# Patient Record
Sex: Male | Born: 1935 | Race: Black or African American | Hispanic: No | Marital: Married | Smoking: Former smoker
Health system: Southern US, Community
[De-identification: ages and names within clinical notes are randomized; demographics above are authoritative.]

## PROBLEM LIST (undated history)

## (undated) DIAGNOSIS — G990 Autonomic neuropathy in diseases classified elsewhere: Secondary | ICD-10-CM

## (undated) DIAGNOSIS — K7030 Alcoholic cirrhosis of liver without ascites: Secondary | ICD-10-CM

## (undated) DIAGNOSIS — Z91199 Patient's noncompliance with other medical treatment and regimen due to unspecified reason: Secondary | ICD-10-CM

## (undated) DIAGNOSIS — Z978 Presence of other specified devices: Secondary | ICD-10-CM

## (undated) DIAGNOSIS — Z8709 Personal history of other diseases of the respiratory system: Secondary | ICD-10-CM

## (undated) DIAGNOSIS — R600 Localized edema: Secondary | ICD-10-CM

## (undated) DIAGNOSIS — Z960 Presence of urogenital implants: Secondary | ICD-10-CM

## (undated) DIAGNOSIS — F1010 Alcohol abuse, uncomplicated: Secondary | ICD-10-CM

## (undated) DIAGNOSIS — K769 Liver disease, unspecified: Secondary | ICD-10-CM

## (undated) DIAGNOSIS — D649 Anemia, unspecified: Secondary | ICD-10-CM

## (undated) DIAGNOSIS — N312 Flaccid neuropathic bladder, not elsewhere classified: Secondary | ICD-10-CM

## (undated) DIAGNOSIS — F1020 Alcohol dependence, uncomplicated: Principal | ICD-10-CM

## (undated) DIAGNOSIS — I10 Essential (primary) hypertension: Secondary | ICD-10-CM

## (undated) DIAGNOSIS — Z9119 Patient's noncompliance with other medical treatment and regimen: Secondary | ICD-10-CM

## (undated) DIAGNOSIS — R972 Elevated prostate specific antigen [PSA]: Secondary | ICD-10-CM

## (undated) DIAGNOSIS — I499 Cardiac arrhythmia, unspecified: Secondary | ICD-10-CM

## (undated) DIAGNOSIS — I480 Paroxysmal atrial fibrillation: Secondary | ICD-10-CM

## (undated) DIAGNOSIS — R296 Repeated falls: Secondary | ICD-10-CM

## (undated) DIAGNOSIS — K219 Gastro-esophageal reflux disease without esophagitis: Secondary | ICD-10-CM

## (undated) DIAGNOSIS — E785 Hyperlipidemia, unspecified: Secondary | ICD-10-CM

## (undated) DIAGNOSIS — M1990 Unspecified osteoarthritis, unspecified site: Secondary | ICD-10-CM

## (undated) HISTORY — DX: Paroxysmal atrial fibrillation: I48.0

## (undated) HISTORY — DX: Elevated prostate specific antigen (PSA): R97.20

## (undated) HISTORY — DX: Gastro-esophageal reflux disease without esophagitis: K21.9

## (undated) HISTORY — DX: Autonomic neuropathy in diseases classified elsewhere: G99.0

## (undated) HISTORY — DX: Flaccid neuropathic bladder, not elsewhere classified: N31.2

## (undated) HISTORY — DX: Anemia, unspecified: D64.9

## (undated) HISTORY — DX: Presence of urogenital implants: Z96.0

## (undated) HISTORY — DX: Patient's noncompliance with other medical treatment and regimen due to unspecified reason: Z91.199

## (undated) HISTORY — DX: Alcohol abuse, uncomplicated: F10.10

## (undated) HISTORY — DX: Hyperlipidemia, unspecified: E78.5

## (undated) HISTORY — DX: Essential (primary) hypertension: I10

## (undated) HISTORY — DX: Personal history of other diseases of the respiratory system: Z87.09

## (undated) HISTORY — DX: Alcoholic cirrhosis of liver without ascites: K70.30

## (undated) HISTORY — DX: Unspecified osteoarthritis, unspecified site: M19.90

## (undated) HISTORY — DX: Patient's noncompliance with other medical treatment and regimen: Z91.19

## (undated) HISTORY — DX: Localized edema: R60.0

## (undated) HISTORY — DX: Liver disease, unspecified: K76.9

## (undated) HISTORY — DX: Alcohol dependence, uncomplicated: F10.20

## (undated) HISTORY — PX: LIPOMA EXCISION: SHX5283

## (undated) SURGERY — DRAINAGE, EMPYEMA
Anesthesia: General | Laterality: Right

---

## 1999-09-24 ENCOUNTER — Ambulatory Visit (HOSPITAL_BASED_OUTPATIENT_CLINIC_OR_DEPARTMENT_OTHER): Admission: RE | Admit: 1999-09-24 | Discharge: 1999-09-24 | Payer: Self-pay | Admitting: *Deleted

## 1999-09-24 ENCOUNTER — Encounter (INDEPENDENT_AMBULATORY_CARE_PROVIDER_SITE_OTHER): Payer: Self-pay | Admitting: *Deleted

## 2003-09-14 ENCOUNTER — Encounter (INDEPENDENT_AMBULATORY_CARE_PROVIDER_SITE_OTHER): Payer: Self-pay | Admitting: Specialist

## 2003-09-14 ENCOUNTER — Ambulatory Visit (HOSPITAL_COMMUNITY): Admission: RE | Admit: 2003-09-14 | Discharge: 2003-09-14 | Payer: Self-pay | Admitting: *Deleted

## 2003-09-14 ENCOUNTER — Ambulatory Visit (HOSPITAL_BASED_OUTPATIENT_CLINIC_OR_DEPARTMENT_OTHER): Admission: RE | Admit: 2003-09-14 | Discharge: 2003-09-14 | Payer: Self-pay | Admitting: *Deleted

## 2005-12-02 ENCOUNTER — Encounter: Payer: Self-pay | Admitting: Family Medicine

## 2006-12-01 ENCOUNTER — Ambulatory Visit (HOSPITAL_BASED_OUTPATIENT_CLINIC_OR_DEPARTMENT_OTHER): Admission: RE | Admit: 2006-12-01 | Discharge: 2006-12-01 | Payer: Self-pay | Admitting: *Deleted

## 2006-12-02 ENCOUNTER — Encounter: Admission: RE | Admit: 2006-12-02 | Discharge: 2006-12-02 | Payer: Self-pay | Admitting: *Deleted

## 2007-02-02 ENCOUNTER — Encounter (INDEPENDENT_AMBULATORY_CARE_PROVIDER_SITE_OTHER): Payer: Self-pay | Admitting: *Deleted

## 2007-02-02 ENCOUNTER — Ambulatory Visit (HOSPITAL_BASED_OUTPATIENT_CLINIC_OR_DEPARTMENT_OTHER): Admission: RE | Admit: 2007-02-02 | Discharge: 2007-02-02 | Payer: Self-pay | Admitting: *Deleted

## 2008-05-21 ENCOUNTER — Ambulatory Visit: Payer: Self-pay | Admitting: Family Medicine

## 2008-05-21 DIAGNOSIS — M1710 Unilateral primary osteoarthritis, unspecified knee: Secondary | ICD-10-CM

## 2008-05-21 DIAGNOSIS — D649 Anemia, unspecified: Secondary | ICD-10-CM | POA: Insufficient documentation

## 2008-05-21 DIAGNOSIS — E785 Hyperlipidemia, unspecified: Secondary | ICD-10-CM

## 2008-05-21 DIAGNOSIS — K219 Gastro-esophageal reflux disease without esophagitis: Secondary | ICD-10-CM

## 2008-11-20 ENCOUNTER — Telehealth: Payer: Self-pay | Admitting: Family Medicine

## 2009-02-11 ENCOUNTER — Ambulatory Visit: Payer: Self-pay | Admitting: Family Medicine

## 2009-02-11 DIAGNOSIS — M109 Gout, unspecified: Secondary | ICD-10-CM

## 2009-02-11 DIAGNOSIS — I10 Essential (primary) hypertension: Secondary | ICD-10-CM

## 2009-02-18 ENCOUNTER — Ambulatory Visit: Payer: Self-pay | Admitting: Family Medicine

## 2009-11-29 ENCOUNTER — Telehealth: Payer: Self-pay | Admitting: Family Medicine

## 2010-04-16 ENCOUNTER — Telehealth: Payer: Self-pay | Admitting: Family Medicine

## 2010-04-17 ENCOUNTER — Ambulatory Visit: Payer: Self-pay | Admitting: Internal Medicine

## 2010-04-17 DIAGNOSIS — H8110 Benign paroxysmal vertigo, unspecified ear: Secondary | ICD-10-CM | POA: Insufficient documentation

## 2010-04-21 ENCOUNTER — Ambulatory Visit: Payer: Self-pay | Admitting: Family Medicine

## 2010-04-21 ENCOUNTER — Encounter: Payer: Self-pay | Admitting: Family Medicine

## 2010-07-11 ENCOUNTER — Ambulatory Visit: Payer: Self-pay | Admitting: Internal Medicine

## 2010-07-11 DIAGNOSIS — K4090 Unilateral inguinal hernia, without obstruction or gangrene, not specified as recurrent: Secondary | ICD-10-CM | POA: Insufficient documentation

## 2010-07-11 DIAGNOSIS — R209 Unspecified disturbances of skin sensation: Secondary | ICD-10-CM

## 2010-07-15 DIAGNOSIS — E871 Hypo-osmolality and hyponatremia: Secondary | ICD-10-CM

## 2010-07-17 LAB — CONVERTED CEMR LAB
Albumin: 3.2 g/dL — ABNORMAL LOW (ref 3.5–5.2)
Alkaline Phosphatase: 143 units/L — ABNORMAL HIGH (ref 39–117)
Basophils Absolute: 0.1 10*3/uL (ref 0.0–0.1)
CO2: 27 meq/L (ref 19–32)
Calcium: 9.1 mg/dL (ref 8.4–10.5)
Folate: 7.7 ng/mL
Hemoglobin: 12.2 g/dL — ABNORMAL LOW (ref 13.0–17.0)
Lymphocytes Relative: 37.5 % (ref 12.0–46.0)
Lymphs Abs: 4.7 10*3/uL — ABNORMAL HIGH (ref 0.7–4.0)
MCHC: 34.6 g/dL (ref 30.0–36.0)
MCV: 107.6 fL — ABNORMAL HIGH (ref 78.0–100.0)
Magnesium: 1.3 mg/dL — ABNORMAL LOW (ref 1.5–2.5)
Monocytes Relative: 13 % — ABNORMAL HIGH (ref 3.0–12.0)
Platelets: 324 10*3/uL (ref 150.0–400.0)
RBC: 3.28 M/uL — ABNORMAL LOW (ref 4.22–5.81)
Sodium: 126 meq/L — ABNORMAL LOW (ref 135–145)
TSH: 3.53 microintl units/mL (ref 0.35–5.50)
Vitamin B-12: 671 pg/mL (ref 211–911)
WBC: 12.6 10*3/uL — ABNORMAL HIGH (ref 4.5–10.5)

## 2010-07-22 ENCOUNTER — Ambulatory Visit
Admission: RE | Admit: 2010-07-22 | Discharge: 2010-07-22 | Payer: Self-pay | Source: Home / Self Care | Attending: Family Medicine | Admitting: Family Medicine

## 2010-07-24 LAB — CONVERTED CEMR LAB: Sodium: 135 meq/L (ref 135–145)

## 2010-07-30 ENCOUNTER — Telehealth (INDEPENDENT_AMBULATORY_CARE_PROVIDER_SITE_OTHER): Payer: Self-pay | Admitting: *Deleted

## 2010-07-31 ENCOUNTER — Telehealth (INDEPENDENT_AMBULATORY_CARE_PROVIDER_SITE_OTHER): Payer: Self-pay | Admitting: *Deleted

## 2010-08-04 ENCOUNTER — Other Ambulatory Visit: Payer: Self-pay | Admitting: Family Medicine

## 2010-08-04 ENCOUNTER — Ambulatory Visit
Admission: RE | Admit: 2010-08-04 | Discharge: 2010-08-04 | Payer: Self-pay | Source: Home / Self Care | Attending: Family Medicine | Admitting: Family Medicine

## 2010-08-04 LAB — LIPID PANEL
Cholesterol: 146 mg/dL (ref 0–200)
HDL: 49.4 mg/dL (ref >39.00)
LDL Cholesterol: 82 mg/dL (ref 0–99)
Total CHOL/HDL Ratio: 3
Triglycerides: 74 mg/dL (ref 0.0–149.0)
VLDL: 14.8 mg/dL (ref 0.0–40.0)

## 2010-08-04 LAB — PSA: PSA: 13.52 ng/mL — ABNORMAL HIGH (ref 0.10–4.00)

## 2010-08-04 LAB — CBC WITH DIFFERENTIAL/PLATELET
Basophils Absolute: 0.1 10*3/uL (ref 0.0–0.1)
Basophils Relative: 0.5 % (ref 0.0–3.0)
Eosinophils Absolute: 0.5 10*3/uL (ref 0.0–0.7)
Eosinophils Relative: 3.7 % (ref 0.0–5.0)
HCT: 39.8 % (ref 39.0–52.0)
Hemoglobin: 13.7 g/dL (ref 13.0–17.0)
Lymphocytes Relative: 21.8 % (ref 12.0–46.0)
Lymphs Abs: 3.1 10*3/uL (ref 0.7–4.0)
MCHC: 34.4 g/dL (ref 30.0–36.0)
MCV: 109.4 fl — ABNORMAL HIGH (ref 78.0–100.0)
Monocytes Absolute: 1.5 10*3/uL — ABNORMAL HIGH (ref 0.1–1.0)
Monocytes Relative: 10.7 % (ref 3.0–12.0)
Neutro Abs: 9 10*3/uL — ABNORMAL HIGH (ref 1.4–7.7)
Neutrophils Relative %: 63.3 % (ref 43.0–77.0)
Platelets: 317 10*3/uL (ref 150.0–400.0)
RBC: 3.63 Mil/uL — ABNORMAL LOW (ref 4.22–5.81)
RDW: 16.9 % — ABNORMAL HIGH (ref 11.5–14.6)
WBC: 14.3 10*3/uL — ABNORMAL HIGH (ref 4.5–10.5)

## 2010-08-04 LAB — HEPATIC FUNCTION PANEL
ALT: 52 U/L (ref 0–53)
AST: 72 U/L — ABNORMAL HIGH (ref 0–37)
Albumin: 3.2 g/dL — ABNORMAL LOW (ref 3.5–5.2)
Alkaline Phosphatase: 180 U/L — ABNORMAL HIGH (ref 39–117)
Bilirubin, Direct: 0.6 mg/dL — ABNORMAL HIGH (ref 0.0–0.3)
Total Bilirubin: 2.7 mg/dL — ABNORMAL HIGH (ref 0.3–1.2)
Total Protein: 7.2 g/dL (ref 6.0–8.3)

## 2010-08-04 LAB — BASIC METABOLIC PANEL
BUN: 8 mg/dL (ref 6–23)
CO2: 27 mEq/L (ref 19–32)
Calcium: 9.2 mg/dL (ref 8.4–10.5)
Chloride: 91 mEq/L — ABNORMAL LOW (ref 96–112)
Creatinine, Ser: 0.9 mg/dL (ref 0.4–1.5)
GFR: 106.88 mL/min (ref >60.00)
Glucose, Bld: 106 mg/dL — ABNORMAL HIGH (ref 70–99)
Potassium: 4.3 mEq/L (ref 3.5–5.1)
Sodium: 130 mEq/L — ABNORMAL LOW (ref 135–145)

## 2010-08-04 LAB — TSH: TSH: 2.76 u[IU]/mL (ref 0.35–5.50)

## 2010-08-04 LAB — URIC ACID: Uric Acid, Serum: 7.6 mg/dL (ref 4.0–7.8)

## 2010-08-07 ENCOUNTER — Ambulatory Visit
Admission: RE | Admit: 2010-08-07 | Discharge: 2010-08-07 | Payer: Self-pay | Source: Home / Self Care | Attending: Family Medicine | Admitting: Family Medicine

## 2010-08-07 ENCOUNTER — Encounter: Payer: Self-pay | Admitting: Family Medicine

## 2010-08-07 DIAGNOSIS — F1020 Alcohol dependence, uncomplicated: Secondary | ICD-10-CM | POA: Insufficient documentation

## 2010-08-07 DIAGNOSIS — R972 Elevated prostate specific antigen [PSA]: Secondary | ICD-10-CM | POA: Insufficient documentation

## 2010-08-07 DIAGNOSIS — R740 Nonspecific elevation of levels of transaminase and lactic acid dehydrogenase [LDH]: Secondary | ICD-10-CM

## 2010-08-07 DIAGNOSIS — D72829 Elevated white blood cell count, unspecified: Secondary | ICD-10-CM | POA: Insufficient documentation

## 2010-08-11 ENCOUNTER — Encounter: Payer: Self-pay | Admitting: Family Medicine

## 2010-08-15 ENCOUNTER — Telehealth: Payer: Self-pay | Admitting: Family Medicine

## 2010-08-19 NOTE — Progress Notes (Signed)
Summary: Dizzy spells...  Phone Note Call from Patient   Caller: Spouse Call For: Bob Beat MD Summary of Call: Pts wife came into the office this am. Says pt is falling out, stating his dizzy. Asked pts wife how long has this been going on, wife says the pt told her  he is always falling, after a dizzy spell.   Wife says pt has been having these episoles for sometime. Scheduled appt for Thursday at 8:30am w/ Dr. Sharen Hones.Daine Gip  April 16, 2010 12:26 PM  Initial call taken by: Daine Gip,  April 16, 2010 12:26 PM  Follow-up for Phone Call        if longstanding, this is ok  -- i don't think this should wait til next week  i think i am already filled up tom will cc: Dr. Reece Agar thank-you Follow-up by: Bob Beat MD,  April 16, 2010 1:36 PM

## 2010-08-19 NOTE — Assessment & Plan Note (Signed)
Summary: DIZZY, FALLING   Vital Signs:  Patient profile:   77 year old male Height:      75 inches Weight:      188 pounds BMI:     23.58 Temp:     98.3 degrees F oral Pulse rate:   76 / minute Pulse (ortho):   80 / minute Pulse rhythm:   regular BP sitting:   142 / 80  (left arm) BP standing:   144 / 80 Cuff size:   regular  Vitals Entered By: Selena Batten Dance CMA Duncan Dull) (April 17, 2010 8:35 AM)  Serial Vital Signs/Assessments:  Time      Position  BP       Pulse  Resp  Temp     By           Lying LA  170/90   80                    Kim Dance CMA (AAMA)           Sitting   142/80   76                    Kim Dance CMA (AAMA)           Standing  144/80   80                    Kim Dance CMA (AAMA)   History of Present Illness: CC: equilibrium  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  When flips over in bed too fast feels room spinning.  Lasts 10-15 seconds.  When stands up too fast, ceiling spins.  Has had 3 falls in last 4 months.  2d ago had fall and hit head with wall. Has bandaid on.  Also notes that at night falls because walks with socks on tile.  Says this started back in 1981 (even when working under cars felt dizziness with turning head too rapidly).  No presyncope, no syncope, no LOC, no AMS or confusion.  no pain in ears, no decreased hearing, no ringing in ears, no n/v.  No CP/tightness, SOB, HA.  Requests refills to be sent in next week as SS check coming in 04/26/10.  Taking colchicine once daily.  Lisinopril started last visit caused facial swelling so patient stopped.  Current Medications (verified): 1)  Colchicine 0.6 Mg Tabs (Colchicine) .... Take One Tablet Two Times A Day As Needed 2)  Verapamil Hcl Cr 240 Mg Cr-Tabs (Verapamil Hcl) .Marland Kitchen.. 1 By Mouth Daily 3)  Hydrocodone-Acetaminophen 5-500 Mg Tabs (Hydrocodone-Acetaminophen) .... Take One Tablet Every 8 Hours As Needed 4)  Meloxicam 15 Mg Tabs (Meloxicam) .... One By Mouth  Daily  Allergies (verified): 1)  ! Lisinopril  Past History:  Past Medical History: Last updated: 07/05/2008 Anemia-NOS Arthritis Gout Hyperlipidemia Hypertension GERD Elevated PSA, 7, multiple times refused Urology eval ([facility] notes) ETOH PMH-FH-SH reviewed for relevance  Review of Systems       per HPI  Physical Exam  General:  Well-developed,well-nourished,in no acute distress; alert,appropriate and cooperative throughout examination Head:  Normocephalic .   Eyes:  No corneal or conjunctival inflammation noted. EOMI. Perrla. cataracts Ears:  External ear exam shows no significant lesions or deformities.  Otoscopic examination reveals clear canals, tympanic membranes are intact bilaterally without bulging, retraction, inflammation or discharge. Hearing is grossly normal bilaterally. Nose:  External nasal examination shows no deformity or inflammation.  Nasal mucosa are pink and moist without lesions or exudates. Mouth:  Oral mucosa and oropharynx without lesions or exudates. Neck:  No deformities, masses, or tenderness noted. Neurologic:  CN 2-12 intact.  Station slight imbalance, gait steady with cane.  Sensory, motor functions intact.  Negative romberg.  Unable to stand tandem.  + vertigo with rapid changes in position.    + dix-hallpike on L with nystagmus. Skin:  + superficial abrasion R temple region.   Impression & Recommendations:  Problem # 1:  BENIGN PAROXYSMAL POSITIONAL VERTIGO (ICD-386.11) Sounds consistent with BPPV.  Demonstrated maneuvers to self-treat vertigo.  Performed epley maneuver on patient on L.  Set up with PT dysequilibrium training and ambulatory assistive device fitting hopefully for initial visit then HHPT as neither I nor pt feel comfortable with him driving with current vertigo episodes.  Wife able to drive him on Fridays.  RTC 1 wk for f/u.  Due for CPE.    Orders: Physical Therapy Referral (PT)  Complete Medication List: 1)  Colchicine  0.6 Mg Tabs (Colchicine) .... Take one tablet two times a day as needed 2)  Verapamil Hcl Cr 240 Mg Cr-tabs (Verapamil hcl) .Marland Kitchen.. 1 by mouth daily 3)  Hydrocodone-acetaminophen 5-500 Mg Tabs (Hydrocodone-acetaminophen) .... Take one tablet every 8 hours as needed 4)  Meloxicam 15 Mg Tabs (Meloxicam) .... One by mouth daily  Patient Instructions: 1)  I think you have some BPPV worse on L.  use handouts as provided to help with this. 2)  I'm going to set you up with physical therapy to help with balance.  Pass by Marion's office for set up. 3)  Get up SLOWLY and take at least 10 seconds between changes in position to help prevent falls. 4)  I'd like you to follow up next week with Dr. Patsy Lager or myself. 5)  Pleasure to meet you today, call clinic with questions.  Current Allergies (reviewed today): ! LISINOPRIL

## 2010-08-19 NOTE — Progress Notes (Signed)
Summary: mobic refill  Phone Note Refill Request Message from:  [employer] on Nov 29, 2009 9:23 AM  Refills Requested: Medication #1:  MELOXICAM 15 MG TABS one by mouth daily   Supply Requested: 6 months [facility] [address]   Method Requested: Electronic Initial call taken by: Benny Lennert CMA Duncan Dull),  Nov 29, 2009 9:23 AM  Follow-up for Phone Call        I have not seen since 05/2008 for routine health care. should schedule a physical   Follow-up by: Hannah Beat MD,  Dec 01, 2009 10:14 AM  Additional Follow-up for Phone Call Additional follow up Details #1::        Advised pt.  He said he cant afford to come in anytime soon.  Cant come in for a physical, will try to come in for a follow up visit.           Lowella Petties CMA  Dec 02, 2009 10:59 AM     Prescriptions: MELOXICAM 15 MG TABS (MELOXICAM) one by mouth daily  #30 Tablet x 2   Entered and Authorized by:   Hannah Beat MD   Signed by:   Hannah Beat MD on 12/01/2009   Method used:   Electronically to        [facility]. [address] [phone]* (retail)       [address] [place], [place]  [id number]       Ph: [phone]       Fax: [phone]   RxID:   [id number]

## 2010-08-19 NOTE — Assessment & Plan Note (Signed)
Summary: F/U PER DR GUTIERREZ/DLO   Vital Signs:  Patient profile:   77 year old male Height:      75 inches Weight:      185.0 pounds BMI:     23.21 Temp:     98.3 degrees F oral Pulse rate:   76 / minute Pulse rhythm:   regular BP sitting:   142 / 90  (left arm) Cuff size:   regular  Vitals Entered By: Benny Lennert CMA Duncan Dull) (April 21, 2010 11:43 AM)  History of Present Illness: Chief complaint follow up per dr Danise Edge  BP was elevated:  Dizziness: cancel PT appt. 30 year history improved compared to last week when saw Dr. Reece Agar on [employer], cannot afford vestib rehab  HTN, chronically elevated for 50 years, d/c ACE when got angioedema, did not follow-up.  Allergies: 1)  ! Lisinopril 2)  ! Ace Inhibitors  Past History:  Past medical, surgical, family and social histories (including risk factors) reviewed, and no changes noted (except as noted below).  Past Medical History: Anemia-NOS Arthritis Gout Hyperlipidemia Hypertension GERD Elevated PSA, 7, multiple times refused Urology eval ([facility] notes) ETOH Noncompliant, with follow-up, non-compliant with labs, medical noncompliance.  Family History: Reviewed history and no changes required.  Social History: Reviewed history from 02/11/2009 and no changes required. Marital Status: married Children:  Occupation: former Games developer Never Smoked (quit 45 years ago) Alcohol use-yes  Review of Systems       no n/v/d, no fever or chills  Physical Exam  General:  Well-developed,well-nourished,in no acute distress; alert,appropriate and cooperative throughout examination Head:  Normocephalic and atraumatic without obvious abnormalities. No apparent alopecia or balding. Ears:  no external deformities.   Nose:  no external deformity.   Lungs:  Normal respiratory effort, chest expands symmetrically. Lungs are clear to auscultation, no crackles or wheezes. Heart:  Normal rate and regular rhythm. S1  and S2 normal without gallop, murmur, click, rub or other extra sounds. Cervical Nodes:  No lymphadenopathy noted Psych:  Cognition and judgment appear intact. Alert and cooperative with normal attention span and concentration. No apparent delusions, illusions, hallucinations   Impression & Recommendations:  Problem # 1:  BENIGN PAROXYSMAL POSITIONAL VERTIGO (ICD-386.11) 30 year history vestib rehab a good idea, cannot afford  dramamine as needed   Problem # 2:  HYPERTENSION, ESSENTIAL NOS (ICD-401.9) Assessment: Deteriorated increase verapamil  His updated medication list for this problem includes:     Verapamil Hcl Cr 360 Mg Xr24h-cap (Verapamil hcl) .Marland Kitchen... 1 by mouth daily  Problem # 3:  GOUT (ICD-274.9)  His updated medication list for this problem includes:    Colchicine 0.6 Mg Tabs (Colchicine) .Marland Kitchen... Take one tablet two times a day as needed    Meloxicam 15 Mg Tabs (Meloxicam) ..... One by mouth daily  Problem # 4:  DEGENERATIVE JOINT DISEASE, KNEE (ICD-715.96)  His updated medication list for this problem includes:    Hydrocodone-acetaminophen 5-500 Mg Tabs (Hydrocodone-acetaminophen) .Marland Kitchen... Take one tablet every 8 hours as needed    Meloxicam 15 Mg Tabs (Meloxicam) ..... One by mouth daily  Complete Medication List: 1)  Colchicine 0.6 Mg Tabs (Colchicine) .... Take one tablet two times a day as needed 2)  Verapamil Hcl Cr 360 Mg Xr24h-cap (Verapamil hcl) .Marland Kitchen.. 1 by mouth daily 3)  Hydrocodone-acetaminophen 5-500 Mg Tabs (Hydrocodone-acetaminophen) .... Take one tablet every 8 hours as needed 4)  Meloxicam 15 Mg Tabs (Meloxicam) .... One by mouth daily  Patient Instructions:  1)  Prephysical Labs, several days before, fasting 2)  BMP, HFP, FLP, CBC with diff, TSH, PSA: v77.91, v77.1, ,780.79, v76.44  3)  move CPX up (30 min anywhere) 4)  (CALL AND ASK ABOUT PHYSICALS, BLOODWORK AND CHARGES ASSOCIATED WITH THAT) Prescriptions: COLCHICINE 0.6 MG TABS (COLCHICINE) take  one tablet two times a day as needed  #60 Tablet x 10   Entered and Authorized by:   Hannah Beat MD   Signed by:   Hannah Beat MD on 04/21/2010   Method used:   Electronically to        [facility]. [address] [phone]* (retail)       [address] [place], [place]  [id number]       Ph: [phone]       Fax: [phone]   RxID:   [id number] MELOXICAM 15 MG TABS (MELOXICAM) one by mouth daily  #30 Tablet x 11   Entered and Authorized by:   Hannah Beat MD   Signed by:   Hannah Beat MD on 04/21/2010   Method used:   Electronically to        [facility]. [address] [phone]* (retail)       [address] [place], [place]  [id number]       Ph: [phone]       Fax: [phone]   RxID:   [id number] VERAPAMIL HCL CR 360 MG XR24H-CAP (VERAPAMIL HCL) 1 by mouth daily  #30 x 11   Entered and Authorized by:   Hannah Beat MD   Signed by:   Hannah Beat MD on 04/21/2010   Method used:   Electronically to        [facility]. [address] [phone]* (retail)       [address] [place], [place]  [id number]       Ph: [phone]       Fax: [phone]   RxID:   [id number]   Current Allergies (reviewed today): ! LISINOPRIL ! ACE INHIBITORS

## 2010-08-21 NOTE — Progress Notes (Signed)
Summary: new script needed for colcrys  Phone Note From Pharmacy   Caller: [facility] Summary of Call: [facility] is on  your desk, they are asking for a new script for colcrys- Dr. Cyndie Chime pt. Initial call taken by: Lowella Petties CMA, AAMA,  August 15, 2010 9:54 AM  Follow-up for Phone Call        Okay to await PCPs return.  Follow-up by: Kerby Nora MD,  August 15, 2010 2:14 PM

## 2010-08-21 NOTE — Assessment & Plan Note (Signed)
Summary: CPX/RBH   Vital Signs:  Patient profile:   77 year old male Height:      75 inches Weight:      175.25 pounds BMI:     21.98 Temp:     98.0 degrees F oral Pulse rate:   80 / minute Pulse rhythm:   regular BP sitting:   120 / 80  (right arm) Cuff size:   large  Vitals Entered By: Benny Lennert CMA Duncan Dull) (August 07, 2010 8:29 AM)  Vision Screening:Left eye with correction: 20 / 15 Right eye with correction: 20 / 25 Both eyes with correction: 20 / 20  Color vision testing: normal   Bob Wilson # 2: Pass     Vision Entered By: Benny Lennert CMA (AAMA) (August 07, 2010 8:29 AM)  Hearing Screen 40db HL: Left  500 hz: 40db 1000 hz: 40db 2000 hz: 40db 4000 hz: 40db Right  500 hz: 40db 1000 hz: 40db 2000 hz: 40db 4000 hz: 40db    History of Present Illness: 77 year old male here for medicare wellness exam:  Never had heart probs quit smoking 50 years ago.  I have personally reviewed the Medicare Annual Wellness questionnaire and have noted 1.   The patient's medical and social history 2.   Their use of alcohol, tobacco or illicit drugs 3.   Their current medications and supplements 4.   The patient's functional ability including ADL's, fall risks, home safety risks and hearing or visual             impairment. 5.   Diet and physical activities 6.   Evidence for depression or mood disorders  The patients weight, height, BMI and visual acuity have been recorded in the chart I have made referrals, counseling and provided education to the patient based review of the above and I have provided the pt with a written personalized care plan for preventive services.   I have provided you with a copy of your personalized plan for preventive services. Please take the time to review along with your updated medication list.   1. Abnormal PSA: PSA of 13. Refused prior urological consult when PSA was 7, continues to refuse consulation. No FH Prostate CA.  2.  Elevated liver function, alcohol abuse, drinking 6-8 drinks a night for 50 years. Precontemplative on quitting alcohol.   3. Elevated WBC  4. Gout, doing well, on Colcrys. Stable, no recent flares.  5. HTN: stable, tolerating Ca channel blocker well.  Contraindications/Deferment of Procedures/Staging:    Test/Procedure: Colonoscopy    Reason for deferment: patient declined   Preventive Screening-Counseling & Management  Alcohol-Tobacco     Alcohol drinks/day: >5     Alcohol type: spirits     >5/day in last 3 mos: yes     Alcohol Counseling: to decrease amount and/or frequency of alcohol intake     Feels annoyed by complaints: yes     Smoking Status: never  Caffeine-Diet-Exercise     Does Patient Exercise: no     Exercise Counseling: to improve exercise regimen  Hep-HIV-STD-Contraception     HIV Risk: no risk noted     STD Risk: no risk noted     Testicular SE Education/Counseling not applicable      Sexual History:  currently monogamous.        Drug Use:  never.    Clinical Review Panels:  Prevention   Last PSA:  13.52 (08/04/2010)  Lipid Management   Cholesterol:  146 (08/04/2010)  LDL (bad choesterol):  82 (08/04/2010)   HDL (good cholesterol):  49.40 (08/04/2010)  Diabetes Management   Creatinine:  0.9 (08/04/2010)  CBC   WBC:  14.3 (08/04/2010)   RBC:  3.63 (08/04/2010)   Hgb:  13.7 (08/04/2010)   Hct:  39.8 (08/04/2010)   Platelets:  317.0 (08/04/2010)   MCV  109.4 (08/04/2010)   MCHC  34.4 (08/04/2010)   RDW  16.9 (08/04/2010)   PMN:  63.3 (08/04/2010)   Lymphs:  21.8 (08/04/2010)   Monos:  10.7 (08/04/2010)   Eosinophils:  3.7 (08/04/2010)   Basophil:  0.5 (08/04/2010)  Complete Metabolic Panel   Glucose:  106 (08/04/2010)   Sodium:  130 (08/04/2010)   Potassium:  4.3 (08/04/2010)   Chloride:  91 (08/04/2010)   CO2:  27 (08/04/2010)   BUN:  8 (08/04/2010)   Creatinine:  0.9 (08/04/2010)   Albumin:  3.2 (08/04/2010)   Total Protein:  7.2  (08/04/2010)   Calcium:  9.2 (08/04/2010)   Total Bili:  2.7 (08/04/2010)   Alk Phos:  180 (08/04/2010)   SGPT (ALT):  52 (08/04/2010)   SGOT (AST):  72 (08/04/2010)   Allergies: 1)  ! Lisinopril 2)  ! Ace Inhibitors  Past History:  Past medical, surgical, family and social histories (including risk factors) reviewed, and no changes noted (except as noted below).  Past Medical History: Reviewed history from 04/21/2010 and no changes required. Anemia-NOS Arthritis Gout Hyperlipidemia Hypertension GERD Elevated PSA, 7, multiple times refused Urology eval ([facility] notes) ETOH Noncompliant, with follow-up, non-compliant with labs, medical noncompliance.  Family History: Reviewed history and no changes required.  Social History: Reviewed history from 07/11/2010 and no changes required. Marital Status: married Children:  Occupation: former Games developer remote Smoking (quit 45 years ago) Alcohol use-yes Does Patient Exercise:  no HIV Risk:  no risk noted STD Risk:  no risk noted Sexual History:  currently monogamous Drug Use:  never  Review of Systems  General: Denies fever, chills, sweats, anorexia, fatigue, weakness, malaise Eyes: Denies blurring, vision loss ENT: Denies earache, nasal congestion, nosebleeds, sore throat, and hoarseness.  Cardiovascular: Denies chest pains, palpitations, syncope, dyspnea on exertion,  Respiratory: Denies cough, dyspnea at rest, excessive sputum,wheeezing GI: Denies nausea, vomiting, diarrhea, constipation, change in bowel habits, abdominal pain, melena, hematochezia GU: Denies dysuria, hematuria, discharge, urinary frequency, urinary hesitancy, nocturia, incontinence, genital sores. IMPOTENCE Musculoskeletal: Denies back pain, joint pain Derm: Denies rash, itching Neuro: Denies  paresthesias, frequent falls, frequent headaches, and difficulty walking.  Psych: Denies depression, anxiety Endocrine: Denies cold intolerance, heat  intolerance, polydipsia, polyphagia, polyuria, and unusual weight change.  Heme: Denies enlarged lymph nodes Allergy: No hayfever   Physical Exam  General:  Well-developed,well-nourished,in no acute distress; alert,appropriate and cooperative throughout examination Head:  Normocephalic and atraumatic without obvious abnormalities. No apparent alopecia or balding. Eyes:  pupils equal, pupils round, pupils reactive to light, and pupils react to accomodation.   Ears:  External ear exam shows no significant lesions or deformities.  Otoscopic examination reveals clear canals, tympanic membranes are intact bilaterally without bulging, retraction, inflammation or discharge. Hearing is grossly normal bilaterally. Nose:  External nasal examination shows no deformity or inflammation. Nasal mucosa are pink and moist without lesions or exudates. Mouth:  pharynx pink and moist.   Neck:  No deformities, masses, or tenderness noted. Chest Wall:  No deformities, masses, tenderness or gynecomastia noted. Lungs:  Normal respiratory effort, chest expands symmetrically. Lungs are clear to auscultation, no crackles or wheezes. Heart:  Normal rate and regular rhythm. S1 and S2 normal without gallop, murmur, click, rub or other extra sounds. Abdomen:  Bowel sounds positive,abdomen soft and non-tender without masses, organomegaly or hernias noted. Rectal:  No external abnormalities noted. Normal sphincter tone. No rectal masses or tenderness. Genitalia:  Testes bilaterally descended without nodularity, tenderness or masses. No scrotal masses or lesions. No penis lesions or urethral discharge. Prostate:  moderately enlarged prostate without nodules or asymetry Msk:  normal ROM and no crepitation.   Extremities:  No clubbing, cyanosis, edema, or deformity noted with normal full range of motion of all joints.   Neurologic:  alert & oriented X3 and gait normal.   Skin:  no rashes.   Cervical Nodes:  No lymphadenopathy  noted Psych:  Cognition and judgment appear intact. Alert and cooperative with normal attention span and concentration. No apparent delusions, illusions, hallucinations   Impression & Recommendations:  Problem # 1:  HEALTH MAINTENANCE EXAM (ICD-V70.0)  The patient's preventative maintenance and recommended screening tests for an annual wellness exam were reviewed in full today. This patient essentially refused all recommendations for age and what is indicated based on abnormal screening lab studies.  Counselled on the importance of diet, exercise, and its role in overall health and mortality. The patient's FH and SH was reviewed, including their home life, tobacco status, and drug and alcohol status.   Orders: Medicare -1st Annual Wellness Visit [id number])  Problem # 2:  HYPERTENSION, ESSENTIAL NOS (ICD-401.9) Assessment: Unchanged  His updated medication list for this problem includes:    Verapamil Hcl Cr 360 Mg Xr24h-cap (Verapamil hcl) .Marland Kitchen... 1 by mouth daily  BP today: 120/80 Prior BP: 128/80 (07/11/2010)  Prior 10 Yr Risk Heart Disease: Not enough information (02/18/2009)  Labs Reviewed: K+: 4.3 (08/04/2010) Creat: : 0.9 (08/04/2010)   Chol: 146 (08/04/2010)   HDL: 49.40 (08/04/2010)   LDL: 82 (08/04/2010)   TG: 74.0 (08/04/2010)  Problem # 3:  PROSTATE SPECIFIC ANTIGEN, ELEVATED (ICD-790.93) PSA 13, long discussion with he and his wife, advised urological consult, but he refused. "I will think on it." Discussed that he may be missing Prostate Cancer in explicit, clear detail.  Problem # 4:  TRANSAMINASES, SERUM, ELEVATED (ICD-790.4) suspect due to ETOH  Problem # 5:  ALCOHOLISM (ICD-303.90) counselled to decrease drinking, which I suspect is effecting his liver, blood count, and electrolyte imbalance  Problem # 6:  LEUKOCYTOSIS (ICD-288.60)  Problem # 7:  HYPONATREMIA (ICD-276.1)  Problem # 8:  GOUT (ICD-274.9) Assessment: Improved  The following medications were  removed from the medication list:    Meloxicam 15 Mg Tabs (Meloxicam) ..... One by mouth daily His updated medication list for this problem includes:    Colchicine 0.6 Mg Tabs (Colchicine) .Marland Kitchen... Take one tablet two times a day as needed    Aleve 220 Mg Tabs (Naproxen sodium) .Marland Kitchen... As needed for pain  Problem # 9:  HYPERLIPIDEMIA (ICD-272.4) Assessment: Improved  Labs Reviewed: SGOT: 72 (08/04/2010)   SGPT: 52 (08/04/2010)  Prior 10 Yr Risk Heart Disease: Not enough information (02/18/2009)   HDL:49.40 (08/04/2010)  LDL:82 (08/04/2010)  Chol:146 (08/04/2010)  Trig:74.0 (08/04/2010)  Complete Medication List: 1)  Colchicine 0.6 Mg Tabs (Colchicine) .... Take one tablet two times a day as needed 2)  Verapamil Hcl Cr 360 Mg Xr24h-cap (Verapamil hcl) .Marland Kitchen.. 1 by mouth daily 3)  Hydrocodone-acetaminophen 5-500 Mg Tabs (Hydrocodone-acetaminophen) .... Take one tablet every 8 hours as needed 4)  Vitamin B-1 100 Mg Tabs (Thiamine hcl) .Marland KitchenMarland KitchenMarland Kitchen  One daily 5)  Folic Acid 400 Mcg Tabs (Folic acid) .... Take one daily 6)  Magnesium Oxide 400 Mg Tabs (Magnesium oxide) .... Take one daily 7)  Aleve 220 Mg Tabs (Naproxen sodium) .... As needed for pain 8)  One-a-day Weight Smart Advance Tabs (Multiple vitamins-minerals) .... Take one tablet daily 9)  Eq Acid Reducer 75 Mg Tabs (Ranitidine hcl) .... One tablet dialy   Orders Added: 1)  Medicare -1st Annual Wellness Visit [G0438] 2)  Est. Patient Level IV [78295]    Current Allergies (reviewed today): ! LISINOPRIL ! ACE INHIBITORS  Prevention & Chronic Care Immunizations   Influenza vaccine: Not documented   Influenza vaccine deferral: Refused  (08/07/2010)    Tetanus booster: Not documented   Td booster deferral: Refused  (08/07/2010)    Pneumococcal vaccine: Not documented   Pneumococcal vaccine deferral: Refused  (08/07/2010)    H. zoster vaccine: Not documented   H. zoster vaccine deferral: Refused  (08/07/2010)  Colorectal Screening    Hemoccult: Not documented    Colonoscopy: Not documented   Colonoscopy action/deferral: patient declined  (08/07/2010)  Other Screening   PSA: 13.52  (08/04/2010)   PSA due due: 08/05/2011   Smoking status: never  (08/07/2010)  Lipids   Total Cholesterol: 146  (08/04/2010)   LDL: 82  (08/04/2010)   LDL Direct: Not documented   HDL: 49.40  (08/04/2010)   Triglycerides: 74.0  (08/04/2010)    SGOT (AST): 72  (08/04/2010)   SGPT (ALT): 52  (08/04/2010)   Alkaline phosphatase: 180  (08/04/2010)   Total bilirubin: 2.7  (08/04/2010)    Lipid flowsheet reviewed?: Yes   Progress toward LDL goal: At goal  Hypertension   Last Blood Pressure: 120 / 80  (08/07/2010)   Serum creatinine: 0.9  (08/04/2010)   Serum potassium 4.3  (08/04/2010)    Hypertension flowsheet reviewed?: Yes   Progress toward BP goal: At goal    Stage of readiness to change (hypertension management): Maintenance  Self-Management Support :    Hypertension self-management support: Not documented    Lipid self-management support: Not documented      Prevention & Chronic Care Immunizations   Influenza vaccine: Not documented   Influenza vaccine deferral: Refused  (08/07/2010)    Tetanus booster: Not documented   Td booster deferral: Refused  (08/07/2010)    Pneumococcal vaccine: Not documented   Pneumococcal vaccine deferral: Refused  (08/07/2010)    H. zoster vaccine: Not documented   H. zoster vaccine deferral: Refused  (08/07/2010)  Colorectal Screening   Hemoccult: Not documented    Colonoscopy: Not documented   Colonoscopy action/deferral: patient declined  (08/07/2010)  Other Screening   PSA: 13.52  (08/04/2010)   PSA due due: 08/05/2011   Smoking status: never  (08/07/2010)  Lipids   Total Cholesterol: 146  (08/04/2010)   LDL: 82  (08/04/2010)   LDL Direct: Not documented   HDL: 49.40  (08/04/2010)   Triglycerides: 74.0  (08/04/2010)    SGOT (AST): 72  (08/04/2010)   SGPT  (ALT): 52  (08/04/2010)   Alkaline phosphatase: 180  (08/04/2010)   Total bilirubin: 2.7  (08/04/2010)    Lipid flowsheet reviewed?: Yes   Progress toward LDL goal: At goal  Hypertension   Last Blood Pressure: 120 / 80  (08/07/2010)   Serum creatinine: 0.9  (08/04/2010)   Serum potassium 4.3  (08/04/2010)    Hypertension flowsheet reviewed?: Yes   Progress toward BP goal: At goal  Self-Management Support :  Hypertension self-management support: Not documented    Lipid self-management support: Not documented

## 2010-08-21 NOTE — Progress Notes (Signed)
----   Converted from flag ---- ---- 07/28/2010 12:37 PM, Hannah Beat MD wrote: Prephysical Labs, several days before, fasting BMP, HFP, FLP, CBC with diff, TSH, PSA: v77.91, v77.1, ,780.79, v76.44  Uric acid (gout)  ---- 07/28/2010 11:48 AM, Liane Comber CMA (AAMA) wrote: Lab orders please! Good Morning! This pt is scheduled for cpx labs Monday, which labs to draw and dx codes to use? Thanks [name] ------------------------------

## 2010-08-21 NOTE — Assessment & Plan Note (Signed)
Summary: numb below knees or both legs/rbh   Vital Signs:  Patient profile:   77 year old male Weight:      178.25 pounds Temp:     98.3 degrees F oral Pulse rate:   80 / minute Pulse rhythm:   regular BP sitting:   128 / 80  (left arm) Cuff size:   large  Vitals Entered By: Selena Batten Dance CMA Duncan Dull) (July 11, 2010 2:10 PM) CC: Numbness below the knees   History of Present Illness: CC: "i can't wake my feet up"  1-2 wk h/o numbness below knees.  Starts in ankles, travles up legs to mid calf.  Feel asleep.    No pain.  no fevers/chills, vomiting, abd pain.    No other paresthesias.  No HA recently, no vision changes, CP/tightness, SOB.  + legs weak.  BP looking ok today.    Hurt back, went to chiropractor and got better.  no saddle anesthesia.  no bowel/bladder accidents.  no radiculopathy.  Mobic daily, as well as alleves in morning.  No smokers at home.  Drinks 3-4 oz scotch/day (mixed with soy milk).    Noted hernia R groin this summer.  sometimes stays out.  wearing sponge to "help keep it in".  Current Medications (verified): 1)  Colchicine 0.6 Mg Tabs (Colchicine) .... Take One Tablet Two Times A Day As Needed 2)  Verapamil Hcl Cr 360 Mg Xr24h-Cap (Verapamil Hcl) .Marland Kitchen.. 1 By Mouth Daily 3)  Hydrocodone-Acetaminophen 5-500 Mg Tabs (Hydrocodone-Acetaminophen) .... Take One Tablet Every 8 Hours As Needed 4)  Meloxicam 15 Mg Tabs (Meloxicam) .... One By Mouth Daily  Allergies: 1)  ! Lisinopril 2)  ! Ace Inhibitors  Past History:  Past Medical History: Last updated: 04/21/2010 Anemia-NOS Arthritis Gout Hyperlipidemia Hypertension GERD Elevated PSA, 7, multiple times refused Urology eval ([facility] notes) ETOH Noncompliant, with follow-up, non-compliant with labs, medical noncompliance. 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  comes in in [place] and robe. Genitalia:  R inguinal hernia. Pulses:  2+ DP/PT Extremities:  no edema Neurologic:  decreased sensation to light touch, temperature, and monofilament medial leg from below malleolus to    Impression & Recommendations:  Problem # 1:  PARESTHESIA (ICD-782.0) along L4 distribution more than stocking distribution.  bkood work today.  No significant leg pain/back pain, no red flags for compression.  monitor for now.  more suspicious for EtOHic neuropathy.  check glu - past due for blood work, CPE 2/2 nonadherence.  Orders: TLB-CBC Platelet - w/Differential (85025-CBCD) TLB-BMP (Basic Metabolic Panel-BMET) (80048-METABOL) TLB-B12 + Folate Pnl (82746_82607-B12/FOL) TLB-TSH (Thyroid Stimulating Hormone) (84443-TSH)  Problem # 2:  INGUINAL HERNIA, RIGHT (ICD-550.90) no pain now.  advised of risk of not treating including infection, death.  pt very resistant to eval by surgery, states will think about it.  advised to go to ER if pain lasting >30 min, fevers, or other concerns.  Complete Medication List: 1)  Colchicine 0.6 Mg Tabs (Colchicine) .... Take one tablet two times a day as needed 2)  Verapamil Hcl Cr 360 Mg Xr24h-cap (Verapamil hcl) .Marland Kitchen.. 1 by mouth daily 3)  Hydrocodone-acetaminophen 5-500 Mg Tabs (Hydrocodone-acetaminophen) .... Take one tablet every 8 hours as needed 4)  Meloxicam 15 Mg  Tabs (Meloxicam) .... One by mouth daily  Patient Instructions: 1)  Return for physical with Dr. Patsy Lager in next few weeks, return fasting prior. 2)  Come back for follow up in next few weeks, may do this during physical or separate visit. 3)  For legs - try to back down on alcohol.  vitamin levels today. 4)  Think about hernia surgery.  If worsening with pain, you need to go to hospital.  dangerous to have  this, if you have fevers or pain, you could have infection and it could be lifethreatening.   Orders Added: 1)  TLB-CBC Platelet - w/Differential [85025-CBCD] 2)  TLB-BMP (Basic Metabolic Panel-BMET) [80048-METABOL] 3)  TLB-B12 + Folate Pnl [82746_82607-B12/FOL] 4)  TLB-TSH (Thyroid Stimulating Hormone) [84443-TSH] 5)  Est. Patient Level IV [14782]    Current Allergies (reviewed today): ! LISINOPRIL ! ACE INHIBITORS

## 2010-08-21 NOTE — Progress Notes (Signed)
----   Converted from flag ---- ---- 07/30/2010 9:30 AM, Eustaquio Boyden  MD wrote: great. thx.  wanted to make sure [name] would be rechecked.  ---- 07/30/2010 8:57 AM, Liane Comber CMA (AAMA) wrote: BMP, HFP, FLP, CBC with diff, TSH, PSA, and gout  ---- 07/30/2010 8:55 AM, Eustaquio Boyden  MD wrote: no need as [name] normal.  what labs is he getting?  ---- 07/30/2010 8:13 AM, Liane Comber CMA (AAMA) wrote: Pt was unable to give urine in office for urine osm, he was given container to bring back but he never did. He is scheduled for labs this Mon and we can try to get a specimen then if you still test done. Let me know Thanks  [name] ------------------------------

## 2010-08-21 NOTE — Letter (Signed)
Summary: Nature conservation officer [employer] Wellness Visit Questionnaire   [employer] Annual Wellness Visit Questionnaire   Imported By: Beau Fanny 08/08/2010 10:38:43  _____________________________________________________________________  External Attachment:    Type:   Image     Comment:   External Document

## 2010-08-22 NOTE — Letter (Signed)
Summary: not interested in PT at this time  Patient Not Interested in [facility]   Imported By: Lanelle Bal 04/28/2010 09:43:53  _____________________________________________________________________  External Attachment:    Type:   Image     Comment:   External Document

## 2010-09-04 NOTE — Medication Information (Signed)
Summary: [facility]  [facility]   Imported By: Kassie Mends 08/27/2010 10:13:31  _____________________________________________________________________  External Attachment:    Type:   Image     Comment:   External Document

## 2010-09-18 ENCOUNTER — Encounter: Payer: Self-pay | Admitting: Family Medicine

## 2010-09-18 ENCOUNTER — Telehealth: Payer: Self-pay | Admitting: Family Medicine

## 2010-09-25 NOTE — Progress Notes (Signed)
Summary: form for handicapped placard  Phone Note Call from Patient   Caller: Patient Call For: Hannah Beat MD Summary of Call: Pt has dropped off form for handicapped placard, form is on your desk.              Lowella Petties CMA, AAMA  September 18, 2010 2:32 PM   Follow-up for Phone Call        done Follow-up by: Hannah Beat MD,  September 18, 2010 2:36 PM

## 2010-09-30 NOTE — Letter (Signed)
Summary: Handicapped Placard/[facility]  Handicapped Placard/[facility]   Imported By: Lanelle Bal 09/22/2010 13:09:48  _____________________________________________________________________  External Attachment:    Type:   Image     Comment:   External Document

## 2010-12-02 NOTE — Op Note (Signed)
NAMEDECKER, COGDELL NO.:  [id number]   MEDICAL RECORD NO.:  [id number]          PATIENT TYPE:  AMB   LOCATION:  [facility]                         FACILITY:  [facility]   PHYSICIAN:  Alfonse Ras, MD   DATE OF BIRTH:  06-16-1933   DATE OF PROCEDURE:  02/02/2007  DATE OF DISCHARGE:                               OPERATIVE REPORT   PREOPERATIVE DIAGNOSIS:  Left posterior neck mass and left shoulder  mass, lipoma.   POSTOPERATIVE DIAGNOSIS:  Left posterior neck mass and left shoulder  mass, lipoma.   PROCEDURE:  Excision of left neck lipoma and left shoulder lipoma.   ANESTHESIA:  General.   SURGEON:  Alfonse Ras, MD.   DESCRIPTION:  The patient was taken to the operating room and placed in  a supine position.  After adequate general anesthesia was induced using  laryngeal mask, the patient was placed in the right lateral decubitus  position.  The neck and left shoulder region were prepped and draped in  the normal sterile fashion.  Using a transverse incision over the  palpable mass in the left neck, I dissected down through subcutaneous  tissue and excised the encapsulated fatty tumor which was encountered.  This was sent for pathological evaluation.  Adequate hemostasis was  ensured, and the skin was closed with subcuticular 4-0 Monocryl.   I then turned my attention to the left posterior shoulder.  A  curvilinear incision was made over that palpable mass as well.  I  dissected down through the subcutaneous tissue using Bovie  electrocautery and excised the encapsulated lipoma without difficulty.  Adequate hemostasis was assured and the skin was closed with a  subcuticular 3-0 Monocryl.  Steri-Strips were applied to both incisions,  injected with 0.5T Marcaine.  Sterile dressings were applied.  The  patient tolerated the procedure well and went to PACU in good condition.      Alfonse Ras, MD  Electronically Signed     KRE/MEDQ  D:  02/02/2007   T:  02/03/2007  Job:  [id number]

## 2010-12-05 NOTE — Op Note (Signed)
NAME:  DESMAN, POLAK NO.:  [id number]   MEDICAL RECORD NO.:  [id number]                   PATIENT TYPE:  AMB   LOCATION:  DSC                                  FACILITY:  [facility]   PHYSICIAN:  Vikki Ports, M.D.         DATE OF BIRTH:  [birth year]   DATE OF PROCEDURE:  09/14/2003  DATE OF DISCHARGE:                                 OPERATIVE REPORT   PREOPERATIVE DIAGNOSIS:  Multilobulated lipoma of the right upper extremity  and subcutaneous mass of the posterior neck.   POSTOPERATIVE DIAGNOSIS:  Multilobulated lipoma of the right upper extremity  and subcutaneous mass of the posterior neck.   PROCEDURE:  Excision of multilobulated right upper extremity lipoma and  excision of subcutaneous lipoma of the posterior neck.   ANESTHESIA:  Local.   SURGEON:  Vikki Ports, M.D.   DESCRIPTION OF PROCEDURE:  The patient was taken to the minor room where the  right arm was prepped and draped in the normal sterile fashion.  The skin  and subcutaneous tissue overlying the bilobulated mass was anesthetized.  A  transverse incision was made over the mass and dissected down in all  directions excising a very multilobulated fatty appearing tumor.  Adequate  hemostasis of the cavity was insured and the skin was closed with  subcuticular 4-0 Monocryl.  An identical procedure was then performed in the  posterior neck.  Both incisions were closed with subcuticular 4-0 Monocryl.  Steri-Strips and sterile dressings were applied.                                               Vikki Ports, M.D.    KRH/MEDQ  D:  09/14/2003  T:  09/14/2003  Job:  [id number]

## 2010-12-05 NOTE — Op Note (Signed)
[place]  Patient:    LAVONNE, CASS                        MRN: [id number] Proc. Date: 09/24/99 Adm. Date:  [id number] Attending:  Stephenie Acres                           Operative Report  PREOPERATIVE DIAGNOSIS:  Upper back mass times three.  POSTOPERATIVE DIAGNOSIS:  Upper back mass times three.  OPERATION:  Excision of lipoma times three of the upper back.  SURGEON:  Catalina Lunger, M.D.  ANESTHESIA:  Local MAC  DESCRIPTION OF PROCEDURE:  The patient was taken to the operating room and placed in the supine position.  After adequate anesthesia was induced using MAC technique, the upper back was prepped and draped in a normal sterile fashion.  All three areas of the skin and subcutaneous tissue was injected using Marcaine solution. Incisions were made.  Lipomas were easily delivered through the wound and excised in their entirety.  All three incisions were closed using running 3-0 nylon sutures.  Sterile dressings were applied.  The patient tolerated the procedure ell and went to PACU in good condition. DD:  09/24/99 TD:  09/25/99 Job: [id number]

## 2011-04-22 ENCOUNTER — Emergency Department (HOSPITAL_COMMUNITY)
Admission: EM | Admit: 2011-04-22 | Discharge: 2011-04-22 | Discharge status: Against Medical Advice | Payer: Medicare PPO | Attending: Emergency Medicine | Admitting: Emergency Medicine

## 2011-04-22 ENCOUNTER — Emergency Department (HOSPITAL_COMMUNITY): Payer: Medicare PPO

## 2011-04-22 DIAGNOSIS — W07XXXA Fall from chair, initial encounter: Secondary | ICD-10-CM | POA: Insufficient documentation

## 2011-04-22 DIAGNOSIS — S0003XA Contusion of scalp, initial encounter: Secondary | ICD-10-CM | POA: Insufficient documentation

## 2011-04-22 DIAGNOSIS — Z79899 Other long term (current) drug therapy: Secondary | ICD-10-CM | POA: Insufficient documentation

## 2011-04-22 DIAGNOSIS — Z8639 Personal history of other endocrine, nutritional and metabolic disease: Secondary | ICD-10-CM | POA: Insufficient documentation

## 2011-04-22 DIAGNOSIS — F1010 Alcohol abuse, uncomplicated: Secondary | ICD-10-CM | POA: Insufficient documentation

## 2011-04-22 DIAGNOSIS — R209 Unspecified disturbances of skin sensation: Secondary | ICD-10-CM | POA: Insufficient documentation

## 2011-04-22 DIAGNOSIS — Z862 Personal history of diseases of the blood and blood-forming organs and certain disorders involving the immune mechanism: Secondary | ICD-10-CM | POA: Insufficient documentation

## 2011-04-22 DIAGNOSIS — S0180XA Unspecified open wound of other part of head, initial encounter: Secondary | ICD-10-CM | POA: Insufficient documentation

## 2011-04-22 DIAGNOSIS — Y92009 Unspecified place in unspecified non-institutional (private) residence as the place of occurrence of the external cause: Secondary | ICD-10-CM | POA: Insufficient documentation

## 2011-04-22 DIAGNOSIS — M069 Rheumatoid arthritis, unspecified: Secondary | ICD-10-CM | POA: Insufficient documentation

## 2011-04-22 LAB — POCT I-STAT, CHEM 8
BUN: 4 mg/dL — ABNORMAL LOW (ref 6–23)
Calcium, Ion: 1.05 mmol/L — ABNORMAL LOW (ref 1.12–1.32)
Chloride: 96 mEq/L (ref 96–112)
HCT: 38 % — ABNORMAL LOW (ref 39.0–52.0)
Sodium: 135 mEq/L (ref 135–145)
TCO2: 24 mmol/L (ref 0–100)

## 2011-04-22 LAB — ETHANOL: Alcohol, Ethyl (B): 158 mg/dL — ABNORMAL HIGH (ref 0–11)

## 2011-04-22 LAB — POCT I-STAT TROPONIN I: Troponin i, poc: 0 ng/mL (ref 0.00–0.08)

## 2011-05-04 LAB — POCT I-STAT 4, (NA,K, GLUC, HGB,HCT)
HCT: 44
Hemoglobin: 15
Operator id: 268271
Sodium: 138

## 2011-06-03 ENCOUNTER — Encounter: Payer: Self-pay | Admitting: Family Medicine

## 2011-06-03 ENCOUNTER — Telehealth: Payer: Self-pay | Admitting: *Deleted

## 2011-06-03 NOTE — Telephone Encounter (Signed)
No, face to face office evaluation needed to evaluate cause.

## 2011-06-03 NOTE — Telephone Encounter (Signed)
Pt's wife came to office today asking if we can send home health care to pt's home to evaluate him for oxygen therapy.  She says he has felt short of breath for the past year.  I advised her that pt will need to be seen in the office for this, but she says he refuses to go to the doctor.  I again advised her that he will need to be seen, she said she will try to talk to him.

## 2011-06-04 ENCOUNTER — Ambulatory Visit (INDEPENDENT_AMBULATORY_CARE_PROVIDER_SITE_OTHER): Payer: Medicare PPO | Admitting: Family Medicine

## 2011-06-04 ENCOUNTER — Ambulatory Visit (INDEPENDENT_AMBULATORY_CARE_PROVIDER_SITE_OTHER)
Admission: RE | Admit: 2011-06-04 | Discharge: 2011-06-04 | Disposition: A | Discharge status: Home / Self Care | Payer: Medicare PPO | Source: Ambulatory Visit | Attending: Family Medicine | Admitting: Family Medicine

## 2011-06-04 ENCOUNTER — Encounter: Payer: Self-pay | Admitting: Family Medicine

## 2011-06-04 VITALS — BP 130/90 | HR 92 | Temp 98.3°F | Wt 177.5 lb

## 2011-06-04 DIAGNOSIS — R0602 Shortness of breath: Secondary | ICD-10-CM

## 2011-06-04 DIAGNOSIS — F1020 Alcohol dependence, uncomplicated: Secondary | ICD-10-CM

## 2011-06-04 DIAGNOSIS — R209 Unspecified disturbances of skin sensation: Secondary | ICD-10-CM

## 2011-06-04 MED ORDER — FUROSEMIDE 20 MG PO TABS: 20.0000 mg | ORAL_TABLET | Freq: Every day | ORAL | Status: DC

## 2011-06-04 NOTE — Patient Instructions (Addendum)
RTC 2 weeks to see Dr Patsy Lager, will need a Bmet then to check metabolites after being on Lasix.  You really need to see Cardiology. One hour spent with pt in both diagnosis and counselling.

## 2011-06-04 NOTE — Assessment & Plan Note (Addendum)
Pt not acutely SOB nor inflamed or coughing.  Has minimal smoking exposure and remote in that quit 50 yrs ago. No other known environmental exposures. No wheezing or h/o asthma.  No CP burt does have DOE. No FH CAD and no known personal history to this point. Mild swelling of the LEs. ?CHF  Very abnml; hepatic panel with extensive ETOH use, presumed liver failure, unknown where along the spectrum he is. ?ETOHic cardiomyopathy? Signif elevated PSA, poss prostate cancer. Mets to lungs causing problems. CXR ok to my eye. Essentially he would like resolution but is not willing to have anything done. Does not want cardiology referral. Is not willing to decrease ETOH intake. (He might start taking B12, Folate, etc) Her has them at home! Only simple thing to try would be diuretic to remove fluid and see if helps. I agreed to try Lasix with his agreement to followup in two weeks for reassessment. He gave me the impression he might not keep this appt so limited script to one month. Chart review shows pt has history of not following through. Really needs cardiology workup.

## 2011-06-04 NOTE — Progress Notes (Signed)
  Subjective:    Patient ID: Bob Wilson, male    DOB: 04/17/1933, 77 y.o.   MRN: [id number]  HPI Pt of Dr Cyndie Chime here with his wife as acute appt for SOB. "I have SOB from time to time." He does not smoke but did 50 years ago. He smoked 1/2 PPD 14 yoa to 64, 7 PYH. He does not have significant allergies , some medications but not really environmental problems. He does not wheeze.  His father died 55 from DM. Mother died in 39s, natyural causes.. Sister died in 48s, another 55, both from natural causes. He denies CP. He sometimes gets SOB with sitting but typically gets it with walking across the room. He stopped working in his garden 2 years ago due to feet insensitivity.  We discdussed his old labs and his liver functions are off, indicating he has liver disease. His MCV is very high as well, indicating hid B12 and Folate are significantly low.  He has swelling of the legs if he doesn't elevate his legs.    Review of Systems  Constitutional: Negative for fever, chills, diaphoresis, activity change, appetite change and fatigue.  HENT: Negative for hearing loss, ear pain, congestion, sore throat, rhinorrhea, neck pain, neck stiffness, postnasal drip, sinus pressure, tinnitus and ear discharge.   Eyes: Negative for pain, discharge and visual disturbance.  Respiratory: Negative for cough, shortness of breath and wheezing.        No acute event. Has been developing slowly for quite some time.  Cardiovascular: Negative for chest pain and palpitations.       No SOB w/ exertion  Gastrointestinal:       No heartburn or swallowing problems.  Genitourinary:       No nocturia  Skin:       No itching or dryness.  Neurological:       No tingling or balance problems.  All other systems reviewed and are negative.       Objective:   Physical Exam        Assessment & Plan:

## 2011-06-04 NOTE — Assessment & Plan Note (Signed)
Needs to stop drinking. Think he heard me but don't think he'll act.

## 2011-06-04 NOTE — Telephone Encounter (Signed)
Pt has appt to see Dr. Hetty Ely today.

## 2011-06-04 NOTE — Assessment & Plan Note (Signed)
Needs to decrease ETOH and start back on regular diet to incr Alb and other parameters and get back on B12, Folate, etc.

## 2011-06-24 ENCOUNTER — Encounter: Payer: Self-pay | Admitting: Family Medicine

## 2011-06-24 ENCOUNTER — Ambulatory Visit (INDEPENDENT_AMBULATORY_CARE_PROVIDER_SITE_OTHER): Payer: Medicare PPO | Admitting: Family Medicine

## 2011-06-24 DIAGNOSIS — R0602 Shortness of breath: Secondary | ICD-10-CM

## 2011-06-24 DIAGNOSIS — F1020 Alcohol dependence, uncomplicated: Secondary | ICD-10-CM

## 2011-06-24 DIAGNOSIS — R7402 Elevation of levels of lactic acid dehydrogenase (LDH): Secondary | ICD-10-CM

## 2011-06-24 DIAGNOSIS — R972 Elevated prostate specific antigen [PSA]: Secondary | ICD-10-CM

## 2011-06-24 DIAGNOSIS — Z91199 Patient's noncompliance with other medical treatment and regimen due to unspecified reason: Secondary | ICD-10-CM

## 2011-06-24 DIAGNOSIS — Z9119 Patient's noncompliance with other medical treatment and regimen: Secondary | ICD-10-CM

## 2011-06-24 NOTE — Patient Instructions (Signed)
Omeprazole 20 mg, 30 minutes before breakfast.  

## 2011-06-24 NOTE — Progress Notes (Signed)
Patient Name: Bob Wilson Date of Birth: May 04, 1933 Age: 77 y.o. Medical Record Number: [id number] Gender: male  History of Present Illness:  Bob Wilson is a 77 y.o. very pleasant male patient who presents with the following:  2 week follow-up shortness of breath:  Dyspnea: has been taking some lasix and that has been getting better.  Not much of a smoker in the past, stopped about 50 years ago - only for a couple of years.  He has been a chronic alcoholic for greater than 50 years. He drinks much of the day per his report to me. His liver enzymes have been elevated in the past. Point he is not ready to quit drinking. He has cut back somewhat since his office visit with Dr. Hetty Ely.  Comprehensive Metabolic Panel:    Component Value Date/Time   NA 135 04/22/2011 1549   K 3.6 04/22/2011 1549   CL 96 04/22/2011 1549   CO2 27 08/04/2010 0810   BUN 4* 04/22/2011 1549   CREATININE 1.00 04/22/2011 1549   GLUCOSE 108* 04/22/2011 1549   CALCIUM 9.2 08/04/2010 0810   AST 72* 08/04/2010 0810   ALT 52 08/04/2010 0810   ALKPHOS 180* 08/04/2010 0810   BILITOT 2.7* 08/04/2010 0810   PROT 7.2 08/04/2010 0810   ALBUMIN 3.2* 08/04/2010 0810   He has not had any chest pain, but he is having some shortness of breath with relatively minimal exertion. He is significantly short of breath when walks from his house to his car or when he walks down to the mailbox. He short of breath when walking from our waiting room to an examination room. He is lower extremity swelling is improved while taking a low dose of Lasix.  The patient also has had an elevated PSA for a number of years. I've spoken to him about this several times and recommended urological consultation and probable prostate biopsy. On each occasion, the patient has declined further evaluation. This was also done in his prior office, and when I reviewed their notes, it appeared that multiple attempts were made to try to have this gentleman seeks further  care.  He has been medical noncompliant in the past.  Also reviewed with him his elevated liver function test. He has been drinking heavily for 50 years. He indicated that he understood that he could have some significant liver damage and disease. At this point he is not open to stopping his alcohol use.  Cardiac eval Elevated LFT's Elevated PSA  The patient is also complaining of some decreased appetite, as well as early feeling of fullness.  Past Medical History, Surgical History, Social History, Family History, and Problem List have been reviewed in EHR and updated if relevant.  Review of Systems: Shortness of breath with minimal exertion, improved compared to prior evaluation by my partner, but still remains. Lower extremity edema and decreased. No fever or chills.  Physical Examination: Filed Vitals:   06/24/11 0932  BP: 110/70  Pulse: 103  Temp: 97.7 F (36.5 C)  TempSrc: Oral  Height: 6\' 3"  (1.905 m)  Weight: 173 lb 6.4 oz (78.654 kg)  SpO2: 99%    Body mass index is 21.67 kg/(m^2).   GEN: WDWN, NAD, Non-toxic, A & O x 3 HEENT: Atraumatic, Normocephalic. Neck supple. No masses, No LAD. Ears and Nose: No external deformity. CV: RRR, No M/G/R. No JVD. No thrill. No extra heart sounds. PULM: CTA B, no wheezes, crackles, rhonchi. No retractions. No resp. distress. No accessory  muscle use. Abd: soft, nontender, nondistended. Positive bowel sounds. No hepatosplenomegaly. No rebound tenderness. EXTR: No c/c/e NEURO Normal gait.  PSYCH: Normally interactive. Conversant. Not depressed or anxious appearing.  Calm demeanor.    Assessment and Plan: 1. Alcoholism   2. SOB (shortness of breath) on exertion   3. PROSTATE SPECIFIC ANTIGEN, ELEVATED   4. TRANSAMINASES, SERUM, ELEVATED   5. Medically noncompliant     >30 minutes spent in face to face time with patient, >50% spent in counselling or coordination of care: challenging case. I spent the majority of my time  discussing with him and his wife multiple reasons why he needs to seek further care and evaluation for multiple of these problems listed above and in the history of present illness. I discussed with him that he certainly could have significant cardiac disease, congestive heart failure, significant liver damage, prostate cancer, and he certainly could have any number of other problems all relating or in part relating to some of these other problems that he has not sought evaluation for. Additionally, we discussed that his abdominal discomfort may be related to some of these other problems. We also discussed his alcohol abuse, and I counseled him to stop it in any way possible.  At this point, they have some financial restrictions, and the patient would not like to pursue any form of further workup at this time. He may later in the year and 2013. I tried to be very frank and explained that he could be putting himself at risk for death, heart attack, stroke, cancer, liver failure. At this point, he did not want to have further lab assessment as well. He was open to starting a PPI prior to eating in the morning, so I suggested that he start some omeprazole in the morning.

## 2011-06-25 ENCOUNTER — Other Ambulatory Visit: Payer: Self-pay | Admitting: Family Medicine

## 2011-06-25 ENCOUNTER — Encounter: Payer: Self-pay | Admitting: Family Medicine

## 2011-06-25 DIAGNOSIS — Z9119 Patient's noncompliance with other medical treatment and regimen: Secondary | ICD-10-CM | POA: Insufficient documentation

## 2011-06-25 DIAGNOSIS — F1020 Alcohol dependence, uncomplicated: Secondary | ICD-10-CM

## 2011-06-25 HISTORY — DX: Alcohol dependence, uncomplicated: F10.20

## 2011-08-24 ENCOUNTER — Other Ambulatory Visit: Payer: Self-pay | Admitting: Family Medicine

## 2011-12-02 ENCOUNTER — Telehealth: Payer: Self-pay | Admitting: Family Medicine

## 2011-12-02 NOTE — Telephone Encounter (Signed)
Per Hillary Bow, RN: Caller: Annie/Spouse; PCP: Hannah Beat T.; CB#: [phone]; ; ; Call regarding Dx Gout Pain, onset 3 weeks. Taking Hydrocodone, no improvement. Wife is refusing triage. Wife would like to know if anything else could be called in for Gout. Advised Wife, Pt needs to be evaluated, wife states, "He won't come in". PLEASE F/U W/ WIFE AT [phone];

## 2011-12-03 MED ORDER — INDOMETHACIN 50 MG PO CAPS: 50.0000 mg | ORAL_CAPSULE | Freq: Three times a day (TID) | ORAL | Status: DC

## 2011-12-03 NOTE — Telephone Encounter (Signed)
Ok to call in   Indomethacin 50 mg, 1 po tid, #30, 0 refills

## 2011-12-03 NOTE — Telephone Encounter (Signed)
rx sent to pharmacy and patient advised. 

## 2011-12-19 DIAGNOSIS — Z8709 Personal history of other diseases of the respiratory system: Secondary | ICD-10-CM

## 2011-12-19 HISTORY — DX: Personal history of other diseases of the respiratory system: Z87.09

## 2011-12-19 HISTORY — PX: VIDEO ASSISTED THORACOSCOPY (VATS)/EMPYEMA: SHX6172

## 2011-12-28 ENCOUNTER — Telehealth: Payer: Self-pay | Admitting: Radiology

## 2011-12-28 ENCOUNTER — Encounter: Payer: Self-pay | Admitting: Family Medicine

## 2011-12-28 ENCOUNTER — Ambulatory Visit (INDEPENDENT_AMBULATORY_CARE_PROVIDER_SITE_OTHER): Payer: Medicare PPO | Admitting: Family Medicine

## 2011-12-28 ENCOUNTER — Other Ambulatory Visit: Payer: Self-pay | Admitting: Family Medicine

## 2011-12-28 VITALS — BP 130/88 | HR 94 | Temp 97.8°F | Ht 75.0 in | Wt 171.8 lb

## 2011-12-28 DIAGNOSIS — R160 Hepatomegaly, not elsewhere classified: Secondary | ICD-10-CM

## 2011-12-28 DIAGNOSIS — R55 Syncope and collapse: Secondary | ICD-10-CM

## 2011-12-28 DIAGNOSIS — R109 Unspecified abdominal pain: Secondary | ICD-10-CM

## 2011-12-28 DIAGNOSIS — Z79899 Other long term (current) drug therapy: Secondary | ICD-10-CM

## 2011-12-28 DIAGNOSIS — D72829 Elevated white blood cell count, unspecified: Secondary | ICD-10-CM

## 2011-12-28 LAB — CBC WITH DIFFERENTIAL/PLATELET
Basophils Relative: 0.2 % (ref 0.0–3.0)
Eosinophils Absolute: 0 10*3/uL (ref 0.0–0.7)
Eosinophils Relative: 0.1 % (ref 0.0–5.0)
HCT: 39.7 % (ref 39.0–52.0)
Lymphs Abs: 1 10*3/uL (ref 0.7–4.0)
MCHC: 33.5 g/dL (ref 30.0–36.0)
MCV: 106 fl — ABNORMAL HIGH (ref 78.0–100.0)
Monocytes Absolute: 2.2 10*3/uL — ABNORMAL HIGH (ref 0.1–1.0)
Neutro Abs: 21.4 10*3/uL — ABNORMAL HIGH (ref 1.4–7.7)
RBC: 3.75 Mil/uL — ABNORMAL LOW (ref 4.22–5.81)
WBC: 24.6 10*3/uL (ref 4.5–10.5)

## 2011-12-28 LAB — BASIC METABOLIC PANEL
CO2: 25 mEq/L (ref 19–32)
Chloride: 101 mEq/L (ref 96–112)
Creatinine, Ser: 1.4 mg/dL (ref 0.4–1.5)
Potassium: 4.3 mEq/L (ref 3.5–5.1)

## 2011-12-28 LAB — HEPATIC FUNCTION PANEL
AST: 25 U/L (ref 0–37)
Alkaline Phosphatase: 179 U/L — ABNORMAL HIGH (ref 39–117)
Bilirubin, Direct: 0.3 mg/dL (ref 0.0–0.3)

## 2011-12-28 NOTE — Telephone Encounter (Signed)
[facility] called critical results, WBC 24.6. Results verbally given to Dr Kathie Rhodes Copland

## 2011-12-28 NOTE — Progress Notes (Signed)
Addended by: Hannah Beat on: 12/28/2011 05:55 PM   Modules accepted: Orders

## 2011-12-28 NOTE — Patient Instructions (Signed)
REFERRAL: GO THE THE FRONT ROOM AT THE ENTRANCE OF OUR CLINIC, NEAR CHECK IN. ASK FOR Bob Wilson. SHE WILL HELP YOU SET UP YOUR REFERRAL. DATE: TIME:  

## 2011-12-28 NOTE — Telephone Encounter (Signed)
Discussed all with the patient and recommended going to ER at [facility] for eval, but he refused.  See addendum

## 2011-12-28 NOTE — Progress Notes (Addendum)
Nature conservation officer at [facility] [address] [place] [address] Phone: [phone] Fax: [phone]   Patient Name: Bob Wilson Date of Birth: May 30, 1933 Medical Record Number: [id number] Gender: male Date of Encounter: 12/28/2011  History of Present Illness:  Bob Wilson is a 78 y.o. very pleasant male patient who presents with the following:  Medically noncompliant patient with a history of chronic alcoholism he drinks approximately 8 ounces of hard liquor a day currently, but used to drink at least a fifth of liquor a day and has been noncompliant with prior recommendations in the past presents with concerns for abdominal pain, flank pain. 2 weeks ago prior, the patient completely blacked out while standing and fell. He is not clear what he fell and hit, but he did have a contusion on his head. A few days ago, the patient started developing some abdominal pain and right-sided pain as well as some pain with taking a deep breath and pain with palpation on the right side.  2 weeks was in the bathroom and shaving and opened cabinet, and has fully blacked out, passed and thinks he fell backwards and hit his tailbone. Pain went straight up his backbone. Also had a knot on his head.     Patient Active Problem List  Diagnoses  . HYPERLIPIDEMIA  . GOUT  . ANEMIA-NOS  . BENIGN PAROXYSMAL POSITIONAL VERTIGO  . HYPERTENSION, ESSENTIAL NOS  . GERD  . DEGENERATIVE JOINT DISEASE, KNEE  . HYPONATREMIA  . LEUKOCYTOSIS  . INGUINAL HERNIA, RIGHT  . PARESTHESIA  . TRANSAMINASES, SERUM, ELEVATED  . PROSTATE SPECIFIC ANTIGEN, ELEVATED  . SOB (shortness of breath) on exertion  . Alcoholism  . Medically noncompliant   Past Medical History  Diagnosis Date  . Anemia     NOS  . Arthritis   . Gout   . Hyperlipidemia   . Hypertension   . GERD (gastroesophageal reflux disease)   . Elevated PSA     multiple times, refused urology eval [facility] notes)  . ETOH abuse   . Medically  noncompliant     noncompliant with follow ups, labs.  . Alcoholism 06/25/2011   No past surgical history on file. History  Substance Use Topics  . Smoking status: Former Games developer  . Smokeless tobacco: Former Neurosurgeon   Comment: Remote- quit 45 years ago.  . Alcohol Use: Yes     daily   No family history on file. Allergies  Allergen Reactions  . Ace Inhibitors     REACTION: Angioedema  . Lisinopril     REACTION: Facial Swelling    Medication list has been reviewed and updated.  Prior to Admission medications   Medication Sig Start Date End Date Taking? Authorizing Provider  colchicine 0.6 MG tablet Take 0.6 mg by mouth daily.    Yes Historical Provider, MD  HYDROcodone-acetaminophen (VICODIN) 5-500 MG per tablet Take 1 tablet by mouth every 8 (eight) hours as needed.     Yes Historical Provider, MD    Review of Systems:  As above. Syncope. No chest pain. Pain with taking a deep breath. Abdominal pain. Flank. No bloody emesis. No bloody bowel movements. No melena. Otherwise 10 point review of systems is negative.  Physical Examination: Filed Vitals:   12/28/11 1042  BP: 130/88  Pulse: 94  Temp: 97.8 F (36.6 C)   Filed Vitals:   12/28/11 1042  Height: 6\' 3"  (1.905 m)  Weight: 171 lb 12 oz (77.905 kg)  Body mass index is 21.47 kg/(m^2).  GEN: WDWN, NAD, Non-toxic, A & O x 3 HEENT: Atraumatic, Normocephalic. Neck supple. No masses, No LAD. Ears and Nose: No external deformity. CV: RRR, No M/G/R. No JVD. No thrill. No extra heart sounds. TTP with squeeze of posterior ribs, R PULM: CTA B, no wheezes, crackles, rhonchi. No retractions. No resp. distress. No accessory muscle use. ABD: S, mild RUQ and epigastric TTP, ND, +BS. No rebound. Liver edge is felt approximately 4 cm below the right costophrenic angle  EXTR: No c/c/e NEURO Normal gait.  PSYCH: Normally interactive. Conversant. Not depressed or anxious appearing.  Calm demeanor.    Assessment and Plan:  1. Flank  pain  CT Abdomen Pelvis Wo Contrast, Hepatic function panel, Lipase, CT Abdomen Pelvis W Contrast  2. Abdominal  pain, other specified site  CT Abdomen Pelvis Wo Contrast, Basic metabolic panel, CBC with Differential, Hepatic function panel, Lipase, CT Abdomen Pelvis W Contrast  3. Syncope  Ambulatory referral to Cardiology, TSH  4. Enlarged liver  CT Abdomen Pelvis Wo Contrast, Hepatic function panel, CT Abdomen Pelvis W Contrast   Obtain a CT of the abdomen and pelvis with contrast to evaluate for potential liver injury, cannot rule out hepatocellular carcinoma given chronic alcoholism, recent trauma and fall, evaluate splenic injury. Evaluate pancreas.  Homero Fellers syncope is worrisome. The patient has declined prior evaluation. He has had at least 3 syncopal events. Recommended full workup and cardiology workup to exclude potential fatal causes including arrhythmia. The patient indicated that he may or may not keep this appointment. I explained to the patient and his wife does could potentiate risk to himself including further falls, injury, fracture, or potentially death.  Worrisome for enlarged liver on examination in chronic alcoholism. Prior elevated liver functions. Suspect potential liver failure. Suggested gastroenterological evaluation. Patient declines at this point. I discussed risks including potential worsening liver function and death with the patient without treatment and continued alcoholism.  Medications Today: No orders of the defined types were placed in this encounter.     Hannah Beat, MD  Addendum:  Results for orders placed in visit on 12/28/11  BASIC METABOLIC PANEL      Component Value Range   Sodium 139  135 - 145 (mEq/L)   Potassium 4.3  3.5 - 5.1 (mEq/L)   Chloride 101  96 - 112 (mEq/L)   CO2 25  19 - 32 (mEq/L)   Glucose, Bld 234 (*) 70 - 99 (mg/dL)   BUN 19  6 - 23 (mg/dL)   Creatinine, Ser 1.4  0.4 - 1.5 (mg/dL)   Calcium 8.1 (*) 8.4 - 10.5 (mg/dL)   GFR  29.56  >21.30 (mL/min)  CBC WITH DIFFERENTIAL      Component Value Range   WBC 24.6 Repeated and verified X2. (*) 4.5 - 10.5 (K/uL)   RBC 3.75 (*) 4.22 - 5.81 (Mil/uL)   Hemoglobin 13.3  13.0 - 17.0 (g/dL)   HCT 86.5  78.4 - 69.6 (%)   MCV 106.0 (*) 78.0 - 100.0 (fl)   MCHC 33.5  30.0 - 36.0 (g/dL)   RDW 29.5 (*) 28.4 - 14.6 (%)   Platelets 355.0  150.0 - 400.0 (K/uL)   Neutrophils Relative 86.8 (*) 43.0 - 77.0 (%)   Lymphocytes Relative 4.1 (*) 12.0 - 46.0 (%)   Monocytes Relative 8.8  3.0 - 12.0 (%)   Eosinophils Relative 0.1  0.0 - 5.0 (%)   Basophils Relative 0.2  0.0 - 3.0 (%)  Neutro Abs 21.4 (*) 1.4 - 7.7 (K/uL)   Lymphs Abs 1.0  0.7 - 4.0 (K/uL)   Monocytes Absolute 2.2 (*) 0.1 - 1.0 (K/uL)   Eosinophils Absolute 0.0  0.0 - 0.7 (K/uL)   Basophils Absolute 0.0  0.0 - 0.1 (K/uL)  HEPATIC FUNCTION PANEL      Component Value Range   Total Bilirubin 1.2  0.3 - 1.2 (mg/dL)   Bilirubin, Direct 0.3  0.0 - 0.3 (mg/dL)   Alkaline Phosphatase 179 (*) 39 - 117 (U/L)   AST 25  0 - 37 (U/L)   ALT 13  0 - 53 (U/L)   Total Protein 7.2  6.0 - 8.3 (g/dL)   Albumin 2.5 (*) 3.5 - 5.2 (g/dL)  LIPASE      Component Value Range   Lipase 13.0  11.0 - 59.0 (U/L)  TSH      Component Value Range   TSH 1.76  0.35 - 5.50 (uIU/mL)    Markedly high CBC in a afebrile patient.   CBC:    Component Value Date/Time   WBC 24.6 Repeated and verified X2.* 12/28/2011 1133   HGB 13.3 12/28/2011 1133   HCT 39.7 12/28/2011 1133   PLT 355.0 12/28/2011 1133   MCV 106.0* 12/28/2011 1133   NEUTROABS 21.4* 12/28/2011 1133   LYMPHSABS 1.0 12/28/2011 1133   MONOABS 2.2* 12/28/2011 1133   EOSABS 0.0 12/28/2011 1133   BASOSABS 0.0 12/28/2011 1133    Clinical concern for potential neoplasm. Discussed case with patient and his wife, I recommended going to [facility] ER for admission given multiple problems, full-scale work-up. He declined.   Will obtain a CT of the chest, abdomen, and pelvis with and without IV  contrast to evaluate for potential neoplasm of the lungs, bone, pancreas, liver, or other abdominal organs.  I spoke to the patient and will do above, he is agreeable, and then consult Onc.  Orders Placed This Encounter  Procedures  . CT Abdomen Pelvis W Contrast    Standing Status: Future     Number of Occurrences:      Standing Expiration Date: 03/29/2013    RM/[place] [phone]/PT NOT DIAB/LABS TO BE DONE/[employer]/H[employer] MEDICARE INS PC PENDING/PT TO DRINK 2 BTLS CM    Order Specific Question:  Reason for exam:    Answer:  abd pain, enlarged liver, flank pain    Order Specific Question:  Preferred imaging location?    Answer:  [place]  . CT Chest W Wo Contrast    Standing Status: Future     Number of Occurrences:      Standing Expiration Date: 03/29/2013    Order Specific Question:  Reason for exam:    Answer:  WBC 24,000, eval for neoplasm.    Order Specific Question:  Preferred imaging location?    Answer:  [place]  . CT Abd Wo & W Cm    Standing Status: Future     Number of Occurrences:      Standing Expiration Date: 03/29/2013    Order Specific Question:  Reason for exam:    Answer:  eval for neoplasm, liver, kidneys, pancreas. WBC 24,000. No fever. Side pain.    Order Specific Question:  Preferred imaging location?    Answer:  [place]  . Basic metabolic panel  . CBC with Differential  . Hepatic function panel  . Lipase  . TSH  . Ambulatory referral to Cardiology    Referral Priority:  Routine    Referral  Type:  Consultation    Referral Reason:  Specialty Services Required    Requested Specialty:  Cardiology    Number of Visits Requested:  1

## 2011-12-29 ENCOUNTER — Other Ambulatory Visit: Payer: Self-pay

## 2011-12-29 MED ORDER — HYDROCODONE-ACETAMINOPHEN 5-500 MG PO TABS: 1.0000 | ORAL_TABLET | Freq: Three times a day (TID) | ORAL | Status: DC | PRN

## 2011-12-29 NOTE — Telephone Encounter (Signed)
Ok, #40, 0 refills

## 2011-12-29 NOTE — Telephone Encounter (Signed)
pt's wife request refill Hydrocodone-APAP to [facility]. Please advise.

## 2011-12-29 NOTE — Telephone Encounter (Signed)
rx called to pharmacy 

## 2011-12-30 ENCOUNTER — Ambulatory Visit (INDEPENDENT_AMBULATORY_CARE_PROVIDER_SITE_OTHER)
Admission: RE | Admit: 2011-12-30 | Discharge: 2011-12-30 | Disposition: A | Discharge status: Home / Self Care | Payer: Medicare Other | Source: Ambulatory Visit | Attending: Family Medicine | Admitting: Family Medicine

## 2011-12-30 ENCOUNTER — Other Ambulatory Visit: Payer: Medicare Other

## 2011-12-30 ENCOUNTER — Inpatient Hospital Stay: Admission: RE | Admit: 2011-12-30 | Payer: Medicare Other | Source: Ambulatory Visit

## 2011-12-30 DIAGNOSIS — D72829 Elevated white blood cell count, unspecified: Secondary | ICD-10-CM

## 2011-12-30 DIAGNOSIS — R160 Hepatomegaly, not elsewhere classified: Secondary | ICD-10-CM

## 2011-12-30 DIAGNOSIS — R109 Unspecified abdominal pain: Secondary | ICD-10-CM

## 2011-12-30 MED ORDER — IOHEXOL 300 MG/ML  SOLN
100.0000 mL | Freq: Once | INTRAMUSCULAR | Status: AC | PRN
  Administered 2011-12-30: 100 mL via INTRAVENOUS

## 2011-12-31 ENCOUNTER — Other Ambulatory Visit: Payer: Self-pay | Admitting: Family Medicine

## 2011-12-31 DIAGNOSIS — C3490 Malignant neoplasm of unspecified part of unspecified bronchus or lung: Secondary | ICD-10-CM

## 2012-01-01 ENCOUNTER — Ambulatory Visit (INDEPENDENT_AMBULATORY_CARE_PROVIDER_SITE_OTHER): Payer: Medicare Other | Admitting: Internal Medicine

## 2012-01-01 ENCOUNTER — Other Ambulatory Visit (INDEPENDENT_AMBULATORY_CARE_PROVIDER_SITE_OTHER): Payer: Medicare Other

## 2012-01-01 ENCOUNTER — Encounter: Payer: Self-pay | Admitting: Internal Medicine

## 2012-01-01 VITALS — BP 120/76 | HR 99 | Ht 75.0 in | Wt 177.0 lb

## 2012-01-01 DIAGNOSIS — D649 Anemia, unspecified: Secondary | ICD-10-CM

## 2012-01-01 DIAGNOSIS — R911 Solitary pulmonary nodule: Secondary | ICD-10-CM

## 2012-01-01 DIAGNOSIS — J90 Pleural effusion, not elsewhere classified: Secondary | ICD-10-CM

## 2012-01-01 DIAGNOSIS — M109 Gout, unspecified: Secondary | ICD-10-CM

## 2012-01-01 NOTE — Progress Notes (Signed)
01/01/12- 76 yoM former smoker referred courtesy of Dr Dallas Schimke with a lung nodule and pleural effusion. Wife is here He has past hx of ETOH and non-compliance. He has had syncope with falls, most recently about 2 weeks PTA. Broke 3 right ribs and wearing rib brace. Weight loss 25 lbs over 2-3 years. He had reported abdominal pain relieved after hydrocodone and absent at this visit. DOE, productive cough light yellow, palpitation. Abnormal CXR led to CT chest and abd 12/28/11-Images reviewed with him and wife-- IMPRESSION:  1. 1.5 x 1.3 cm macrolobulated nodule with some spiculated margins  and retraction of the overlying pleura in the inferior segment  lingula is concerning for primary bronchogenic neoplasm. This  could be further evaluated with PET CT or CT-guided percutaneous  needle biopsy if clinically indicated.  2. Large right-sided partially loculated rim enhancing pleural  fluid collection concerning for potential empyema, malignant  pleural effusion, or potentially a resolving hemothorax (given the  presence of multiple right-sided rib fracture of this may be a  possibility). Clinical correlation is recommended.  3. Extensive passive atelectasis through the majority of the right  lower lobe, in addition to portions of the right middle and upper  lobes. There is also some subsegmental atelectasis or scarring in  the left lower lobe.  4. There are calcifications of the aortic valve. Echocardiographic  correlation for evaluation of potential valvular dysfunction may be  warranted if clinically indicated.   Prior to Admission medications   Medication Sig Start Date End Date Taking? Authorizing Provider  COLCRYS 0.6 MG tablet take 1 tablet by mouth twice a day if needed 12/28/11  Yes Spencer Copland, MD  HYDROcodone-acetaminophen (VICODIN) 5-500 MG per tablet Take 1 tablet by mouth every 8 (eight) hours as needed. 12/29/11  Yes Hannah Beat, MD   Past Medical History  Diagnosis Date    . Anemia     NOS  . Arthritis   . Gout   . Hyperlipidemia   . Hypertension   . GERD (gastroesophageal reflux disease)   . Elevated PSA     multiple times, refused urology eval [facility] notes)  . ETOH abuse   . Medically noncompliant     noncompliant with follow ups, labs.  . Alcoholism 06/25/2011   History reviewed. No pertinent past surgical history. History reviewed. No pertinent family history. History   Social History  . Marital Status: Married    Spouse Name: N/A    Number of Children: N/A  . Years of Education: N/A   Occupational History  . former Games developer    Social History Main Topics  . Smoking status: Former Games developer  . Smokeless tobacco: Former Neurosurgeon   Comment: Remote- quit 45 years ago.  . Alcohol Use: Yes     daily  . Drug Use: No  . Sexually Active: Not on file   Other Topics Concern  . Not on file   Social History Narrative  . No narrative on file   ROS-see HPI Constitutional:   +  weight loss ,No- night sweats, fevers, chills, + fatigue, lassitude. HEENT:   No-  headaches, difficulty swallowing, tooth/dental problems, sore throat,       No-  sneezing, itching, ear ache, nasal congestion, post nasal drip,  CV:  No-  anginal chest pain, orthopnea, PND, swelling in lower extremities, anasarca, dizziness, palpitations Resp: No-   shortness of breath with exertion or at rest.              +  productive cough,  No non-productive cough,  No- coughing up of blood.              +   change in color of mucus.  No- wheezing.  + right lateral chest pain since rib fx. Skin: No-   rash or lesions. GI:  No-   heartburn, indigestion, abdominal pain, nausea, vomiting, diarrhea,                 change in bowel habits, loss of appetite GU: No-   dysuria, change in color of urine, no urgency or frequency.  No- flank pain. MS:  No-   joint pain or swelling.  No- decreased range of motion.  No- back pain. Neuro-     nothing unusual Psych:  No- change in mood or  affect. No depression or anxiety.  No memory loss.  OBJ- Physical Exam General- Alert, Oriented, Affect-appropriate, Distress- none acute, wheelchair, tall, slender Skin- rash-none, lesions- none, excoriation- none Lymphadenopathy- none Head- atraumatic            Eyes- Gross vision intact, PERRLA, conjunctivae and secretions clear            Ears- Hearing, canals-normal            Nose- Clear, no-Septal dev, mucus, polyps, erosion, perforation             Throat- Mallampati II , mucosa clear , drainage- none, tonsils- atrophic Neck- flexible , trachea midline, no stridor , thyroid nl, carotid no bruit Chest - symmetrical excursion , unlabored           Heart/CV- RRR , no murmur , no gallop  , no rub, nl s1 s2                           - JVD- none , edema- none, stasis changes- none, varices- none           Lung- clear to P&A in upper zones, wheeze- none, cough- none ,+  Dullness right lower chest, rub- none, + rib brace           Chest wall-  Abd- tender-no, distended-no, bowel sounds-present, HSM- no Br/ Gen/ Rectal- Not done, not indicated Extrem- cyanosis- none, clubbing, none, atrophy- none, strength- nl, cane in right hand Neuro- grossly intact to observation

## 2012-01-01 NOTE — Patient Instructions (Addendum)
Order-  Lab  PT/ INR, PTT  Order- consult interventional radiology  1) Right thoracentesis-  Pleural effusion                                                                 2) needle biopsy left lung nodule

## 2012-01-04 ENCOUNTER — Other Ambulatory Visit: Payer: Self-pay | Admitting: Internal Medicine

## 2012-01-04 ENCOUNTER — Ambulatory Visit (HOSPITAL_COMMUNITY)
Admission: RE | Admit: 2012-01-04 | Discharge: 2012-01-04 | Disposition: A | Discharge status: Home / Self Care | Payer: Medicare Other | Source: Ambulatory Visit | Attending: Diagnostic Radiology | Admitting: Diagnostic Radiology

## 2012-01-04 ENCOUNTER — Ambulatory Visit (HOSPITAL_COMMUNITY)
Admission: RE | Admit: 2012-01-04 | Discharge: 2012-01-04 | Disposition: A | Discharge status: Home / Self Care | Payer: Medicare Other | Source: Ambulatory Visit | Attending: Internal Medicine | Admitting: Internal Medicine

## 2012-01-04 DIAGNOSIS — R911 Solitary pulmonary nodule: Secondary | ICD-10-CM

## 2012-01-04 DIAGNOSIS — J90 Pleural effusion, not elsewhere classified: Secondary | ICD-10-CM | POA: Insufficient documentation

## 2012-01-04 LAB — BODY FLUID CELL COUNT WITH DIFFERENTIAL
Lymphs, Fluid: 7 %
Monocyte-Macrophage-Serous Fluid: 3 % — ABNORMAL LOW (ref 50–90)

## 2012-01-04 LAB — PROTEIN, BODY FLUID: Total protein, fluid: 3.4 g/dL

## 2012-01-04 LAB — GLUCOSE, SEROUS FLUID

## 2012-01-04 NOTE — Procedures (Signed)
US-guided right thoracentesis.  Removed 450 ml of cloudy, yellow, foul-smelling fluid.  Findings consistent with an empyema.  CXR to follow.

## 2012-01-05 ENCOUNTER — Encounter: Payer: Self-pay | Admitting: Internal Medicine

## 2012-01-05 ENCOUNTER — Telehealth: Payer: Self-pay | Admitting: Internal Medicine

## 2012-01-05 DIAGNOSIS — J869 Pyothorax without fistula: Secondary | ICD-10-CM | POA: Insufficient documentation

## 2012-01-05 DIAGNOSIS — R911 Solitary pulmonary nodule: Secondary | ICD-10-CM | POA: Insufficient documentation

## 2012-01-05 NOTE — Assessment & Plan Note (Signed)
He blames most of his malaise, and his need for wheelchair today, on his gout.

## 2012-01-05 NOTE — Assessment & Plan Note (Signed)
In a former smoker with uncertain degree of COPD, this needs to be regarded as lung cancer until proven benign. I favor a needle biopsy as discussed with him and his wife, but don't want to risk iatrogenic pneumothorax until the effusion in the right lung can be decompressed.  Plan- Discussed with Dr Henn/ IR. Will try R thoracentesis first to see what we get. Patient is not acutely ill, so we can begin as outpatient. It may serve him best to get [facility] involved.

## 2012-01-05 NOTE — Telephone Encounter (Signed)
I called and spoke to patient and wife. Fluid looks like an empyema, not a loculated hemothorax as expected. Path described intra and extracellular organisms, not seen on micro gram stain. We still have the problem of how to manage this, and how to deal with the left lung nodule. I have called and presented the case to Dr Edwyna Shell of [facility]. He will look at patient for drainage and biopsy as appropriate.

## 2012-01-05 NOTE — Assessment & Plan Note (Signed)
This looks chronic and probably loculated. It may relate to his known right rib fractures and hx of fall with syncope, in which case hemothorax is likely. It may decompress with thoracentesis, but will probably require surgical drainage. Plan refer first for thoracentesis.

## 2012-01-06 ENCOUNTER — Ambulatory Visit (HOSPITAL_COMMUNITY): Payer: Medicare Other

## 2012-01-06 ENCOUNTER — Encounter (HOSPITAL_COMMUNITY): Payer: Self-pay | Admitting: Pharmacy Technician

## 2012-01-06 ENCOUNTER — Ambulatory Visit (INDEPENDENT_AMBULATORY_CARE_PROVIDER_SITE_OTHER): Payer: Medicare Other | Admitting: Thoracic Surgery

## 2012-01-06 ENCOUNTER — Encounter: Payer: Self-pay | Admitting: Thoracic Surgery

## 2012-01-06 VITALS — BP 99/65 | HR 81 | Resp 19 | Ht 75.0 in | Wt 171.0 lb

## 2012-01-06 DIAGNOSIS — J90 Pleural effusion, not elsewhere classified: Secondary | ICD-10-CM

## 2012-01-06 DIAGNOSIS — J869 Pyothorax without fistula: Secondary | ICD-10-CM

## 2012-01-06 NOTE — Progress Notes (Signed)
PCP is Hannah Beat, MD Referring Provider is Hannah Beat, MD  Chief Complaint  Patient presents with  . Pleural Effusion    plueral effusion needs possible drainage    HPI: This 78 year old African American male has a long history of alcoholism and is still consuming alcohol. He's had at least 3 document syncopal episodes in the last 6 weeks. He has a history of arthritis. He has developed increasing weakness, weight loss, and dyspnea. CT scan of the chest showed a loculated right pleural effusion. His white count was elevated. He was seen by Dr. Levonne Spiller young and a thoracentesis was done which revealed increased WBCs and loculation. He is referred for possible drainage of an empyema. He also has a history of recent rib fractures on the right so this could be an infected hemothorax. he quit smoking several years ago. He gets around with a walker. He is complaining of increasing weakness. CT scan of the chest also shows a left lingular 2 cm lesion that could be an early lung cancer. Chest x-ray 8 months ago did not show the effusion or the lesion. We plan to admit him to the hospital tomorrow. Will get a brain scan echocardiogram, and again antibiotics. We will try to do a drainage of the loculated effusion on June 21.   Past Medical History  Diagnosis Date  . Anemia     NOS  . Arthritis   . Gout   . Hyperlipidemia   . Hypertension   . GERD (gastroesophageal reflux disease)   . Elevated PSA     multiple times, refused urology eval [facility] notes)  . ETOH abuse   . Medically noncompliant     noncompliant with follow ups, labs.  . Alcoholism 06/25/2011    No past surgical history on file.  No family history on file.  Social History History  Substance Use Topics  . Smoking status: Former Games developer  . Smokeless tobacco: Former Neurosurgeon   Comment: Remote- quit 45 years ago.  . Alcohol Use: Yes     daily    Current Outpatient Prescriptions  Medication Sig Dispense Refill  .  colchicine 0.6 MG tablet Take 0.6 mg by mouth 2 (two) times daily.      . furosemide (LASIX) 20 MG tablet Take 20 mg by mouth daily.      Marland Kitchen HYDROcodone-acetaminophen (VICODIN) 5-500 MG per tablet Take 1 tablet by mouth every 8 (eight) hours as needed. For pain      . ibuprofen (ADVIL,MOTRIN) 200 MG tablet Take 400-600 mg by mouth every 6 (six) hours as needed. For pain        Allergies  Allergen Reactions  . Ace Inhibitors Other (See Comments)    REACTION: Angioedema  . Lisinopril Swelling and Other (See Comments)    REACTION: Facial Swelling    Review of Systems  Constitutional: Positive for fever, activity change, appetite change, fatigue and unexpected weight change.  HENT: Negative.   Eyes: Negative.   Respiratory: Positive for shortness of breath and wheezing.   Cardiovascular: Positive for palpitations and leg swelling.  Gastrointestinal: Positive for abdominal pain and abdominal distention.  Genitourinary: Positive for dysuria and difficulty urinating.  Musculoskeletal: Positive for myalgias, back pain, joint swelling, arthralgias and gait problem.  Skin: Negative.   Neurological: Positive for dizziness, syncope, weakness, light-headedness and numbness.  Hematological: Negative.   Psychiatric/Behavioral: Negative.     BP 99/65  Pulse 81  Resp 19  Ht 6\' 3"  (1.905 m)  Wt  171 lb (77.565 kg)  BMI 21.37 kg/m2  SpO2 93% Physical Exam  Constitutional: He is oriented to person, place, and time. He appears distressed.  HENT:  Head: Normocephalic and atraumatic.  Right Ear: External ear normal.  Left Ear: External ear normal.  Mouth/Throat: Oropharynx is clear and moist.  Eyes: Conjunctivae and EOM are normal. Pupils are equal, round, and reactive to light.  Neck: Normal range of motion. Neck supple. No tracheal deviation present. No thyromegaly present.  Cardiovascular: S1 normal and S2 normal.  An irregular rhythm present.  Extrasystoles are present. Tachycardia present.     Murmur heard.  Systolic murmur is present with a grade of 2/6  Pulses:      Carotid pulses are 1+ on the right side, and 1+ on the left side.      Radial pulses are 1+ on the right side, and 1+ on the left side.       Femoral pulses are 1+ on the right side, and 1+ on the left side.      Popliteal pulses are 1+ on the right side, and 1+ on the left side.       Dorsalis pedis pulses are 1+ on the right side, and 1+ on the left side.       Posterior tibial pulses are 1+ on the right side, and 1+ on the left side.  Pulmonary/Chest: Tachypnea noted. He has decreased breath sounds in the right middle field and the right lower field. He has wheezes in the right middle field. He exhibits tenderness.  Abdominal: Soft. Bowel sounds are normal. He exhibits no mass. There is no tenderness. There is no rebound.  Musculoskeletal: He exhibits edema and tenderness.  Neurological: He is alert and oriented to person, place, and time. He has normal reflexes. A cranial nerve deficit is present.  Skin: Skin is warm and dry.  Psychiatric: He has a normal mood and affect. His behavior is normal. Judgment and thought content normal.     Diagnostic Tests: CT scan shows loculated right pleural effusion with enhancement of pleural left lingular nodule right rib fractures   Impression: Multiple syncopal episodes. Right pleural effusion loculated. Rule out empyema. Rule out infected hemothorax. Alcohol abuse   Plan: Admission to hospital. Drainage of empyema or s infected hemothorax. Workup of syncopal episode with telemetry and CT scan of the brain. Obtain 2-D echo. Watch out for delirium tremors.

## 2012-01-07 ENCOUNTER — Encounter (HOSPITAL_COMMUNITY): Payer: Self-pay | Admitting: Anesthesiology

## 2012-01-07 ENCOUNTER — Inpatient Hospital Stay (HOSPITAL_COMMUNITY)
Admission: RE | Admit: 2012-01-07 | Discharge: 2012-01-19 | DRG: 178 | Disposition: A | Discharge status: Home / Self Care | Payer: Medicare Other | Source: Ambulatory Visit | Attending: Internal Medicine | Admitting: Internal Medicine

## 2012-01-07 ENCOUNTER — Encounter (HOSPITAL_COMMUNITY): Payer: Self-pay | Admitting: Cardiology

## 2012-01-07 ENCOUNTER — Inpatient Hospital Stay (HOSPITAL_COMMUNITY): Payer: Medicare Other

## 2012-01-07 ENCOUNTER — Other Ambulatory Visit: Payer: Self-pay

## 2012-01-07 ENCOUNTER — Encounter (HOSPITAL_COMMUNITY): Payer: Self-pay | Admitting: Radiology

## 2012-01-07 ENCOUNTER — Inpatient Hospital Stay (HOSPITAL_COMMUNITY): Admission: RE | Admit: 2012-01-07 | Payer: Medicare Other | Source: Ambulatory Visit

## 2012-01-07 DIAGNOSIS — R0602 Shortness of breath: Secondary | ICD-10-CM

## 2012-01-07 DIAGNOSIS — Z9119 Patient's noncompliance with other medical treatment and regimen: Secondary | ICD-10-CM

## 2012-01-07 DIAGNOSIS — J90 Pleural effusion, not elsewhere classified: Secondary | ICD-10-CM

## 2012-01-07 DIAGNOSIS — D72829 Elevated white blood cell count, unspecified: Secondary | ICD-10-CM | POA: Diagnosis present

## 2012-01-07 DIAGNOSIS — R160 Hepatomegaly, not elsewhere classified: Secondary | ICD-10-CM

## 2012-01-07 DIAGNOSIS — M25469 Effusion, unspecified knee: Secondary | ICD-10-CM | POA: Diagnosis present

## 2012-01-07 DIAGNOSIS — R221 Localized swelling, mass and lump, neck: Secondary | ICD-10-CM | POA: Diagnosis present

## 2012-01-07 DIAGNOSIS — F1020 Alcohol dependence, uncomplicated: Secondary | ICD-10-CM

## 2012-01-07 DIAGNOSIS — R209 Unspecified disturbances of skin sensation: Secondary | ICD-10-CM

## 2012-01-07 DIAGNOSIS — H8110 Benign paroxysmal vertigo, unspecified ear: Secondary | ICD-10-CM

## 2012-01-07 DIAGNOSIS — D638 Anemia in other chronic diseases classified elsewhere: Secondary | ICD-10-CM | POA: Diagnosis present

## 2012-01-07 DIAGNOSIS — J869 Pyothorax without fistula: Secondary | ICD-10-CM | POA: Diagnosis present

## 2012-01-07 DIAGNOSIS — K4090 Unilateral inguinal hernia, without obstruction or gangrene, not specified as recurrent: Secondary | ICD-10-CM

## 2012-01-07 DIAGNOSIS — R911 Solitary pulmonary nodule: Secondary | ICD-10-CM | POA: Diagnosis present

## 2012-01-07 DIAGNOSIS — M1710 Unilateral primary osteoarthritis, unspecified knee: Secondary | ICD-10-CM

## 2012-01-07 DIAGNOSIS — Z01811 Encounter for preprocedural respiratory examination: Secondary | ICD-10-CM

## 2012-01-07 DIAGNOSIS — R220 Localized swelling, mass and lump, head: Secondary | ICD-10-CM | POA: Diagnosis present

## 2012-01-07 DIAGNOSIS — I10 Essential (primary) hypertension: Secondary | ICD-10-CM

## 2012-01-07 DIAGNOSIS — F1010 Alcohol abuse, uncomplicated: Secondary | ICD-10-CM | POA: Diagnosis present

## 2012-01-07 DIAGNOSIS — K219 Gastro-esophageal reflux disease without esophagitis: Secondary | ICD-10-CM

## 2012-01-07 DIAGNOSIS — M109 Gout, unspecified: Secondary | ICD-10-CM | POA: Diagnosis present

## 2012-01-07 DIAGNOSIS — E871 Hypo-osmolality and hyponatremia: Secondary | ICD-10-CM | POA: Diagnosis present

## 2012-01-07 DIAGNOSIS — D649 Anemia, unspecified: Secondary | ICD-10-CM

## 2012-01-07 DIAGNOSIS — E876 Hypokalemia: Secondary | ICD-10-CM | POA: Diagnosis present

## 2012-01-07 DIAGNOSIS — R972 Elevated prostate specific antigen [PSA]: Secondary | ICD-10-CM

## 2012-01-07 DIAGNOSIS — E785 Hyperlipidemia, unspecified: Secondary | ICD-10-CM

## 2012-01-07 LAB — DIFFERENTIAL
Band Neutrophils: 6 % (ref 0–10)
Basophils Relative: 1 % (ref 0–1)
Eosinophils Absolute: 0.3 10*3/uL (ref 0.0–0.7)
Eosinophils Relative: 1 % (ref 0–5)
Metamyelocytes Relative: 0 %
Monocytes Absolute: 3.1 10*3/uL — ABNORMAL HIGH (ref 0.1–1.0)
Monocytes Relative: 9 % (ref 3–12)
Smear Review: ADEQUATE
nRBC: 0 /100 WBC

## 2012-01-07 LAB — COMPREHENSIVE METABOLIC PANEL
Albumin: 1.5 g/dL — ABNORMAL LOW (ref 3.5–5.2)
BUN: 24 mg/dL — ABNORMAL HIGH (ref 6–23)
Creatinine, Ser: 1.04 mg/dL (ref 0.50–1.35)
Total Protein: 6.4 g/dL (ref 6.0–8.3)

## 2012-01-07 LAB — BLOOD GAS, ARTERIAL
Bicarbonate: 25.1 mEq/L — ABNORMAL HIGH (ref 20.0–24.0)
FIO2: 0.21 %
O2 Saturation: 93 %
pO2, Arterial: 67.6 mmHg — ABNORMAL LOW (ref 80.0–100.0)

## 2012-01-07 LAB — CBC
HCT: 27.1 % — ABNORMAL LOW (ref 39.0–52.0)
Hemoglobin: 10.2 g/dL — ABNORMAL LOW (ref 13.0–17.0)
MCV: 89.4 fL (ref 78.0–100.0)
RBC: 3.03 MIL/uL — ABNORMAL LOW (ref 4.22–5.81)
WBC: 33.9 10*3/uL — ABNORMAL HIGH (ref 4.0–10.5)

## 2012-01-07 LAB — URINALYSIS, ROUTINE W REFLEX MICROSCOPIC
Hgb urine dipstick: NEGATIVE
Leukocytes, UA: NEGATIVE
Nitrite: NEGATIVE
Specific Gravity, Urine: 1.043 — ABNORMAL HIGH (ref 1.005–1.030)
Urobilinogen, UA: 1 mg/dL (ref 0.0–1.0)

## 2012-01-07 LAB — NO BLOOD PRODUCTS

## 2012-01-07 MED ORDER — POTASSIUM CHLORIDE IN NACL 40-0.9 MEQ/L-% IV SOLN
INTRAVENOUS | Status: DC
  Administered 2012-01-07 – 2012-01-09 (×3): via INTRAVENOUS
  Filled 2012-01-07 (×11): qty 1000

## 2012-01-07 MED ORDER — SODIUM CHLORIDE 0.9 % IJ SOLN
3.0000 mL | Freq: Two times a day (BID) | INTRAMUSCULAR | Status: DC
  Administered 2012-01-07 – 2012-01-19 (×9): 3 mL via INTRAVENOUS

## 2012-01-07 MED ORDER — HYDROCODONE-ACETAMINOPHEN 5-325 MG PO TABS
1.0000 | ORAL_TABLET | Freq: Four times a day (QID) | ORAL | Status: DC | PRN
  Administered 2012-01-08 – 2012-01-18 (×19): 1 via ORAL
  Filled 2012-01-07 (×19): qty 1

## 2012-01-07 MED ORDER — PIPERACILLIN-TAZOBACTAM 3.375 G IVPB
3.3750 g | Freq: Three times a day (TID) | INTRAVENOUS | Status: DC
  Administered 2012-01-07 – 2012-01-15 (×22): 3.375 g via INTRAVENOUS
  Filled 2012-01-07 (×27): qty 50

## 2012-01-07 MED ORDER — SODIUM CHLORIDE 0.9 % IV SOLN: 250.0000 mL | INTRAVENOUS | Status: DC | PRN

## 2012-01-07 MED ORDER — FUROSEMIDE 20 MG PO TABS
20.0000 mg | ORAL_TABLET | Freq: Every day | ORAL | Status: DC
  Filled 2012-01-07: qty 1

## 2012-01-07 MED ORDER — VANCOMYCIN HCL 1000 MG IV SOLR
750.0000 mg | Freq: Two times a day (BID) | INTRAVENOUS | Status: DC
  Administered 2012-01-07 – 2012-01-08 (×2): 750 mg via INTRAVENOUS
  Filled 2012-01-07 (×3): qty 750

## 2012-01-07 MED ORDER — PIPERACILLIN-TAZOBACTAM 3.375 G IVPB: 3.3750 g | Freq: Four times a day (QID) | INTRAVENOUS | Status: DC

## 2012-01-07 MED ORDER — DOCUSATE SODIUM 100 MG PO CAPS
100.0000 mg | ORAL_CAPSULE | Freq: Two times a day (BID) | ORAL | Status: DC
  Administered 2012-01-07 – 2012-01-13 (×5): 100 mg via ORAL
  Filled 2012-01-07 (×17): qty 1

## 2012-01-07 MED ORDER — ALUM & MAG HYDROXIDE-SIMETH 200-200-20 MG/5ML PO SUSP
30.0000 mL | Freq: Four times a day (QID) | ORAL | Status: DC | PRN
  Administered 2012-01-09 – 2012-01-16 (×3): 30 mL via ORAL
  Filled 2012-01-07 (×3): qty 30

## 2012-01-07 MED ORDER — IOHEXOL 300 MG/ML  SOLN
80.0000 mL | Freq: Once | INTRAMUSCULAR | Status: AC | PRN
  Administered 2012-01-07: 80 mL via INTRAVENOUS

## 2012-01-07 MED ORDER — ACETAMINOPHEN 325 MG PO TABS: 650.0000 mg | ORAL_TABLET | Freq: Four times a day (QID) | ORAL | Status: DC | PRN

## 2012-01-07 MED ORDER — ONDANSETRON HCL 4 MG PO TABS: 4.0000 mg | ORAL_TABLET | Freq: Four times a day (QID) | ORAL | Status: DC | PRN

## 2012-01-07 MED ORDER — ONDANSETRON HCL 4 MG/2ML IJ SOLN: 4.0000 mg | Freq: Four times a day (QID) | INTRAMUSCULAR | Status: DC | PRN

## 2012-01-07 MED ORDER — COLCHICINE 0.6 MG PO TABS
0.6000 mg | ORAL_TABLET | Freq: Two times a day (BID) | ORAL | Status: DC
  Administered 2012-01-07: 0.6 mg via ORAL
  Filled 2012-01-07 (×4): qty 1

## 2012-01-07 MED ORDER — SODIUM CHLORIDE 0.9 % IJ SOLN: 3.0000 mL | INTRAMUSCULAR | Status: DC | PRN

## 2012-01-07 MED ORDER — ADULT MULTIVITAMIN W/MINERALS CH
1.0000 | ORAL_TABLET | Freq: Every day | ORAL | Status: DC
  Administered 2012-01-07 – 2012-01-19 (×12): 1 via ORAL
  Filled 2012-01-07 (×14): qty 1

## 2012-01-07 MED ORDER — ACETAMINOPHEN 650 MG RE SUPP: 650.0000 mg | Freq: Four times a day (QID) | RECTAL | Status: DC | PRN

## 2012-01-07 MED ORDER — SODIUM CHLORIDE 0.9 % IJ SOLN
3.0000 mL | Freq: Two times a day (BID) | INTRAMUSCULAR | Status: DC
  Administered 2012-01-07: 3 mL via INTRAVENOUS

## 2012-01-07 NOTE — Progress Notes (Signed)
New IV fluid orders for K replacement; will administer when available from pharmacy.

## 2012-01-07 NOTE — Progress Notes (Signed)
  Echocardiogram 2D Echocardiogram has been performed.  Jorje Guild 01/07/2012, 2:38 PM

## 2012-01-07 NOTE — Consult Note (Signed)
PULMONARY/CCM CONSULT NOTE  Requesting MD/Service: Burney/TCTS Date of admission: 6/20  Date of consult: 6/20 Reason for consultation: peri-op med mgmt  Pt Profile:  79 yobm initially seen in consultation on 6/14 by Dr Maple Hudson for eval of L lung nodule and large R effusion. An US guided thoracentesis was performed by IR on 6/17 and 450 cc of foul smelling cloudy fluid consistent with empyema was removed. He is admitted 6/20 by TCTS service in preparation for VATS drainage of R pleural space planned for 6/21    HPI:  Poor historian. Seen by Dr Maple Hudson as above. He reports increased dyspnea and R sided CP over past 2-3 weeks. He notes a fall and R sided chest trauma approx 2 weeks prior to the onset of the chest pain. He has not noted fevers, chills or sweats.  Past Medical History  Diagnosis Date  . Anemia     NOS  . Arthritis   . Gout   . Hyperlipidemia   . Hypertension   . GERD (gastroesophageal reflux disease)   . Elevated PSA     multiple times, refused urology eval [facility] notes)  . ETOH abuse   . Medically noncompliant     noncompliant with follow ups, labs.  . 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 No pertinent family history.  ROS - Frequent falls attributed to vertigo  Filed Vitals:   01/07/12 1058 01/07/12 1343  BP: 105/64 101/62  Pulse: 96 91  Temp: 98.1 F (36.7 C) 98 F (36.7 C)  TempSrc: Oral Oral  Resp: 18 18  Height: 6\' 3"  (1.905 m)   Weight: 78.6 kg (173 lb 4.5 oz)   SpO2: 96% 95%    EXAM:  Gen: WDWN in NAD HEENT: very poor dentition Neck:  No JVD, no LAN Lungs: dull to percussion with bronchial BS approx 1/2 up on R. Clear on L Cardiovascular: RRR s M Abdomen: NABS, soft, NT Ext: 1-2 symmetric edema Neuro: no focal deficits   DATA:  CXR, CT chest reviewed  BMET    Component  Value Date/Time   NA 128* 01/07/2012 1246   K 3.0* 01/07/2012 1246   CL 91* 01/07/2012 1246   CO2 24 01/07/2012 1246   GLUCOSE 90 01/07/2012 1246   BUN 24* 01/07/2012 1246   CREATININE 1.04 01/07/2012 1246   CALCIUM 8.0* 01/07/2012 1246   GFRNONAA 67* 01/07/2012 1246   GFRAA 78* 01/07/2012 1246   CBC    Component Value Date/Time   WBC 33.9* 01/07/2012 1246   RBC 3.03* 01/07/2012 1246   HGB 10.2* 01/07/2012 1246   HCT 27.1* 01/07/2012 1246   PLT 537* 01/07/2012 1246   MCV 89.4 01/07/2012 1246   MCH 33.7 01/07/2012 1246   MCHC 37.6* 01/07/2012 1246   RDW 15.8* 01/07/2012 1246   LYMPHSABS 1.7 01/07/2012 1246   MONOABS 3.1* 01/07/2012 1246   EOSABS 0.3 01/07/2012 1246   BASOSABS 0.3* 01/07/2012 1246   EKG: NSR, LAD, RBBB, no ischemia    IMPRESSION/RECS:    Empyema (01/07/2012)   Assessment: description of fluid (foul smelling) suggests an anaerobic infection likely related to poor dentition and aspiration (noting history of excessive alcohol use)    Plan: Agree with Vanc/Zosyn  VATS planned 6/21  Pre-operative respiratory examination (01/07/2012)   Assessment: Other than correcting electrolytes, there are no major interventions to be  made that will reduce his peri-operative risk. There are no contra-indications to proceeding with surgery. Biggest concern is risk for major EtOH withdrawal syndrome post-operatively   Plan: PCCM will follow post-operatively  Alcohol abuse (01/07/2012)   Assessment: No prior history of DTs   Plan: PRN Lorazepam ordered. Monitor closely post op. Consider CIWA protocol if indicated  Hypokalemia (01/07/2012)   Assessment: Likely related to lasix use   Plan: Repletion ordered by primary service  Hyponatremia (01/07/2012) - mild   Assessment: Likely related to diuresis and volume overload   Plan: Monitor post-operatively  L lung nodule (01/07/2012)   Assessment: appearance worrisome for malignancy. Needs eval after the more pressing issue of empyema is addressed    Plan: f/u with Dr Maple Hudson post hospitalization    Billy Fischer, MD;  PCCM service; Mobile [phone]

## 2012-01-07 NOTE — Anesthesia Preprocedure Evaluation (Addendum)
Anesthesia Evaluation  Patient identified by MRN, date of birth, ID band Patient awake    Reviewed: H&P , Patient's Chart, lab work & pertinent test results  Airway Mallampati: II  Neck ROM: Full    Dental   Pulmonary shortness of breath,  breath sounds clear to auscultation        Cardiovascular hypertension, Rhythm:Regular Rate:Normal     Neuro/Psych    GI/Hepatic GERD-  ,  Endo/Other    Renal/GU      Musculoskeletal   Abdominal   Peds  Hematology   Anesthesia Other Findings   Reproductive/Obstetrics                           Anesthesia Physical Anesthesia Plan Anesthesia Quick Evaluation

## 2012-01-07 NOTE — Consult Note (Signed)
ANTIBIOTIC CONSULT NOTE - INITIAL  Pharmacy Consult for Vancomycin Indication: complex pleural effusion  Allergies  Allergen Reactions  . Ace Inhibitors Other (See Comments)    REACTION: Angioedema  . Lisinopril Swelling and Other (See Comments)    REACTION: Facial Swelling   Patient Measurements: Height: 6\' 3"  (190.5 cm) Weight: 173 lb 4.5 oz (78.6 kg) IBW/kg (Calculated) : 84.5   Vital Signs: Temp: 98.1 F (36.7 C) (06/20 1058) Temp src: Oral (06/20 1058) BP: 105/64 mmHg (06/20 1058) Pulse Rate: 96  (06/20 1058)  Microbiology: Recent Results (from the past 720 hour(s))  BODY FLUID CULTURE     Status: Normal (Preliminary result)   Collection Time   01/04/12 12:03 PM      Component Value Range Status Comment   Specimen Description FLUID RT CHEST   Final    Special Requests NONE   Final    Gram Stain     Final    Value: NO WBC SEEN     NO ORGANISMS SEEN   Culture Culture reincubated for better growth   Final    Report Status PENDING   Incomplete   AFB CULTURE WITH SMEAR     Status: Normal (Preliminary result)   Collection Time   01/04/12 12:03 PM      Component Value Range Status Comment   Specimen Description FLUID CHEST   Final    Special Requests NONE   Final    ACID FAST SMEAR NO ACID FAST BACILLI SEEN   Final    Culture     Final    Value: CULTURE WILL BE EXAMINED FOR 6 WEEKS BEFORE ISSUING A FINAL REPORT   Report Status PENDING   Incomplete   FUNGUS CULTURE W SMEAR     Status: Normal (Preliminary result)   Collection Time   01/04/12 12:03 PM      Component Value Range Status Comment   Specimen Description FLUID CHEST RIGHT   Final    Special Requests NONE   Final    Fungal Smear NO YEAST OR FUNGAL ELEMENTS SEEN   Final    Culture CULTURE IN PROGRESS FOR FOUR WEEKS   Final    Report Status PENDING   Incomplete     Medical History: Past Medical History  Diagnosis Date  . Anemia     NOS  . Arthritis   . Gout   . Hyperlipidemia   . Hypertension   .  GERD (gastroesophageal reflux disease)   . Elevated PSA     multiple times, refused urology eval [facility] notes)  . ETOH abuse   . Medically noncompliant     noncompliant with follow ups, labs.  . Alcoholism 06/25/2011   Assessment: 79yom s/p right thoracentesis found to have 450cc of cloudy, yellow, foul-smelling fluid consistent empyema (culture pending). He is being admitted for broad spectrum antibiotics, awaiting drainage of the loculated effusion tomorrow. No labs yet this admission but last sCr on 12/28/11 was 1.4 with CrCl 72ml/min.    Goal of Therapy:  Vancomycin trough level 15-20 mcg/ml  Plan:  1) Vancomycin 750mg  IV q12 2) Follow renal function, cultures, levels as indicated  Fredrik Rigger 01/07/2012,12:22 PM

## 2012-01-07 NOTE — Progress Notes (Signed)
K result 3.0; PA paged to make aware; will await callback.

## 2012-01-07 NOTE — Progress Notes (Signed)
                                                Procedure(s) (LRB): VIDEO ASSISTED THORACOSCOPY (VATS)/EMPYEMA (Right) Subjective: Patient was admitted as a right loculated effusion. White count is 34,000. 2-D echo is pending. Feel the patient has an empyema in the right chest. Antibiotic are started plan to do a decortication tomorrow. Patient understands risk and benefits and agrees to the surgery.  Objective: Vital signs in last 24 hours: Temp:  [98 F (36.7 C)-98.1 F (36.7 C)] 98 F (36.7 C) (06/20 1343) Pulse Rate:  [91-96] 91  (06/20 1343) Cardiac Rhythm:  [-]  Resp:  [18] 18  (06/20 1343) BP: (101-105)/(62-64) 101/62 mmHg (06/20 1343) SpO2:  [95 %-96 %] 95 % (06/20 1343) Weight:  [173 lb 4.5 oz (78.6 kg)] 173 lb 4.5 oz (78.6 kg) (06/20 1058)  Hemodynamic parameters for last 24 hours:    Intake/Output from previous day:   Intake/Output this shift: Total I/O In: -  Out: 100 [Urine:100]  General appearance: alert Heart: regularly irregular rhythm Lungs: diminished breath sounds RML and RUL Abdomen: soft, non-tender; bowel sounds normal; no masses,  no organomegaly  Lab Results:  Basename 01/07/12 1246  WBC 33.9*  HGB 10.2*  HCT 27.1*  PLT 537*   BMET:  Basename 01/07/12 1246  NA 128*  K 3.0*  CL 91*  CO2 24  GLUCOSE 90  BUN 24*  CREATININE 1.04  CALCIUM 8.0*    PT/INR:  Basename 01/07/12 1246  LABPROT 14.2  INR 1.08   ABG    Component Value Date/Time   PHART 7.501* 01/07/2012 1345   HCO3 25.1* 01/07/2012 1345   TCO2 26.1 01/07/2012 1345   O2SAT 93.0 01/07/2012 1345   CBG (last 3)  No results found for this basename: GLUCAP:3 in the last 72 hours  Assessment/Plan: S/P Procedure(s) (LRB): VIDEO ASSISTED THORACOSCOPY (VATS)/EMPYEMA (Right) Plan decortication tomorrow if risk and benefits explained to patient patient agrees to surgery.   LOS: 0 days    Bob Wilson [facility] 01/07/2012

## 2012-01-07 NOTE — Progress Notes (Signed)
                   [address]            Bob [place] [address]          [phone]        Procedure(s) (LRB): VIDEO ASSISTED THORACOSCOPY (VATS)/EMPYEMA (Right) Subjective: Patient seen yesterday in office and H+P done per Dr. Edwyna Shell. He remains stable with no new complaints  Objective    Temp:  [98.1 F (36.7 C)] 98.1 F (36.7 C) (06/20 1058) Pulse Rate:  [81-96] 96  (06/20 1058) Resp:  [18-19] 18  (06/20 1058) BP: (99-105)/(64-65) 105/64 mmHg (06/20 1058) SpO2:  [93 %-96 %] 96 % (06/20 1058) Weight:  [171 lb (77.565 kg)-173 lb 4.5 oz (78.6 kg)] 173 lb 4.5 oz (78.6 kg) (06/20 1058)  No intake or output data in the 24 hours ending 01/07/12 1159     General appearance: alert, appears stated age and no distress Heart: regular rate and rhythm and S1, S2 normal Lungs: diminished Right base Abdomen: soft, non-tender; bowel sounds normal; no masses,  no organomegaly Extremities: + Bilat ankle edema Wound: N/A  Lab Results: No results found for this basename: NA:2,K:2,CL:2,CO2:2,GLUCOSE:2,BUN:2,CREATININE:2,CALCIUM:2,MG:2,PHOS:2 in the last 72 hours No results found for this basename: AST:2,ALT:2,ALKPHOS:2,BILITOT:2,PROT:2,ALBUMIN:2 in the last 72 hours No results found for this basename: LIPASE:2,AMYLASE:2 in the last 72 hours No results found for this basename: WBC:2,NEUTROABS:2,HGB:2,HCT:2,MCV:2,PLT:2 in the last 72 hours No results found for this basename: CKTOTAL:4,CKMB:4,TROPONINI:4 in the last 72 hours No components found with this basename: POCBNP:3 No results found for this basename: DDIMER in the last 72 hours No results found for this basename: HGBA1C in the last 72 hours No results found for this basename: CHOL,HDL,LDLCALC,TRIG,CHOLHDL in the last 72 hours No results found for this basename: TSH,T4TOTAL,FREET3,T3FREE,THYROIDAB in the last 72 hours No results found for this basename: VITAMINB12,FOLATE,FERRITIN,TIBC,IRON,RETICCTPCT in the last 72  hours  Medications: Scheduled    . colchicine  0.6 mg Oral BID  . docusate sodium  100 mg Oral BID  . furosemide  20 mg Oral Daily  . multivitamin with minerals  1 tablet Oral Daily  . piperacillin-tazobactam (ZOSYN)  IV  3.375 g Intravenous Q6H  . sodium chloride  3 mL Intravenous Q12H  . sodium chloride  3 mL Intravenous Q12H     Radiology/Studies:  No results found.  INR: Will add last result for INR, ABG once components are confirmed Will add last 4 CBG results once components are confirmed  Assessment/Plan: S/P Procedure(s) (LRB): VIDEO ASSISTED THORACOSCOPY (VATS)/EMPYEMA (Right) Admitted to hospital, clinically stable Place on Vanco and zosyn CT of head and 2D echo ordered preoped for Vats tomorrow   LOS: 0 days    Bob Wilson 6/20/201311:59 AM

## 2012-01-07 NOTE — Consult Note (Deleted)
Note not done  

## 2012-01-08 ENCOUNTER — Inpatient Hospital Stay (HOSPITAL_COMMUNITY): Payer: Medicare Other

## 2012-01-08 ENCOUNTER — Encounter (HOSPITAL_COMMUNITY): Payer: Self-pay | Admitting: Anesthesiology

## 2012-01-08 ENCOUNTER — Encounter (HOSPITAL_COMMUNITY)
Admission: RE | Disposition: A | Discharge status: Home / Self Care | Payer: Self-pay | Source: Ambulatory Visit | Attending: Internal Medicine

## 2012-01-08 ENCOUNTER — Other Ambulatory Visit: Payer: Self-pay | Admitting: Radiology

## 2012-01-08 ENCOUNTER — Other Ambulatory Visit: Payer: Self-pay | Admitting: Physician Assistant

## 2012-01-08 LAB — BODY FLUID CULTURE: Gram Stain: NONE SEEN

## 2012-01-08 SURGERY — VIDEO ASSISTED THORACOSCOPY (VATS)/EMPYEMA
Anesthesia: General

## 2012-01-08 MED ORDER — DARBEPOETIN ALFA-POLYSORBATE 25 MCG/0.42ML IJ SOLN
25.0000 ug | INTRAMUSCULAR | Status: DC
  Administered 2012-01-08 – 2012-01-15 (×2): 25 ug via SUBCUTANEOUS
  Filled 2012-01-08 (×3): qty 0.42

## 2012-01-08 MED ORDER — MIDAZOLAM HCL 2 MG/2ML IJ SOLN
INTRAMUSCULAR | Status: AC
  Filled 2012-01-08: qty 2

## 2012-01-08 MED ORDER — HYDROMORPHONE HCL PF 1 MG/ML IJ SOLN: 0.2500 mg | INTRAMUSCULAR | Status: DC | PRN

## 2012-01-08 MED ORDER — ONDANSETRON HCL 4 MG/2ML IJ SOLN: 4.0000 mg | Freq: Once | INTRAMUSCULAR | Status: DC | PRN

## 2012-01-08 MED ORDER — COLCHICINE 0.6 MG PO TABS
0.6000 mg | ORAL_TABLET | Freq: Two times a day (BID) | ORAL | Status: DC
  Administered 2012-01-12 – 2012-01-14 (×4): 0.6 mg via ORAL
  Filled 2012-01-08 (×16): qty 1

## 2012-01-08 MED ORDER — MIDAZOLAM HCL 2 MG/2ML IJ SOLN
3.0000 mg | Freq: Once | INTRAMUSCULAR | Status: AC
  Administered 2012-01-08: 3 mg via INTRAVENOUS

## 2012-01-08 MED ORDER — MORPHINE SULFATE 4 MG/ML IJ SOLN
INTRAMUSCULAR | Status: AC
  Filled 2012-01-08: qty 1

## 2012-01-08 MED ORDER — MIDAZOLAM HCL 5 MG/5ML IJ SOLN
INTRAMUSCULAR | Status: DC | PRN
  Administered 2012-01-08: 1 mg via INTRAVENOUS

## 2012-01-08 MED ORDER — ACETAMINOPHEN 10 MG/ML IV SOLN: 1000.0000 mg | Freq: Once | INTRAVENOUS | Status: DC | PRN

## 2012-01-08 MED ORDER — LACTATED RINGERS IV SOLN
INTRAVENOUS | Status: DC | PRN
  Administered 2012-01-08: 07:00:00 via INTRAVENOUS

## 2012-01-08 MED ORDER — VANCOMYCIN HCL IN DEXTROSE 1-5 GM/200ML-% IV SOLN
1000.0000 mg | Freq: Two times a day (BID) | INTRAVENOUS | Status: DC
  Administered 2012-01-08 – 2012-01-09 (×2): 1000 mg via INTRAVENOUS
  Filled 2012-01-08 (×3): qty 200

## 2012-01-08 MED ORDER — FENTANYL CITRATE 0.05 MG/ML IJ SOLN
INTRAMUSCULAR | Status: AC
  Filled 2012-01-08: qty 2

## 2012-01-08 MED ORDER — SUFENTANIL CITRATE 50 MCG/ML IV SOLN
INTRAVENOUS | Status: DC | PRN
  Administered 2012-01-08: 5 ug via INTRAVENOUS

## 2012-01-08 MED ORDER — DARBEPOETIN ALFA-POLYSORBATE 25 MCG/0.42ML IJ SOLN
25.0000 ug | INTRAMUSCULAR | Status: DC
  Filled 2012-01-08: qty 0.42

## 2012-01-08 SURGICAL SUPPLY — 72 items
ADH SKN CLS APL DERMABOND .7 (GAUZE/BANDAGES/DRESSINGS)
APL SKNCLS STERI-STRIP NONHPOA (GAUZE/BANDAGES/DRESSINGS)
APL SRG 22X2 LUM MLBL SLNT (VASCULAR PRODUCTS)
APPLICATOR TIP EXT COSEAL (VASCULAR PRODUCTS) IMPLANT
APPLIER CLIP ROT 10 11.4 M/L (STAPLE)
APR CLP MED LRG 11.4X10 (STAPLE)
BENZOIN TINCTURE PRP APPL 2/3 (GAUZE/BANDAGES/DRESSINGS) IMPLANT
CANISTER SUCTION 2500CC (MISCELLANEOUS) IMPLANT
CATH HYDRAGLIDE XL THORACIC (CATHETERS) IMPLANT
CATH KIT ON Q 5IN SLV (PAIN MANAGEMENT) IMPLANT
CATH THORACIC 28FR (CATHETERS) IMPLANT
CATH THORACIC 28FR RT ANG (CATHETERS) IMPLANT
CATH THORACIC 36FR (CATHETERS) IMPLANT
CATH THORACIC 36FR RT ANG (CATHETERS) IMPLANT
CATH URET ROBINSON 16FR STRL (CATHETERS) IMPLANT
CLIP APPLIE ROT 10 11.4 M/L (STAPLE) IMPLANT
CLIP TI MEDIUM 6 (CLIP) ×2 IMPLANT
CLOTH BEACON ORANGE TIMEOUT ST (SAFETY) ×3 IMPLANT
CONN Y 3/8X3/8X3/8  BEN (MISCELLANEOUS)
CONN Y 3/8X3/8X3/8 BEN (MISCELLANEOUS) IMPLANT
CONT SPEC 4OZ CLIKSEAL STRL BL (MISCELLANEOUS) ×4 IMPLANT
COVER SURGICAL LIGHT HANDLE (MISCELLANEOUS) ×4 IMPLANT
DECANTER SPIKE VIAL GLASS SM (MISCELLANEOUS) ×1 IMPLANT
DERMABOND ADVANCED (GAUZE/BANDAGES/DRESSINGS)
DERMABOND ADVANCED .7 DNX12 (GAUZE/BANDAGES/DRESSINGS) ×1 IMPLANT
DRAPE CAMERA CLOSED 9X96 (DRAPES) ×3 IMPLANT
DRAPE LAPAROSCOPIC ABDOMINAL (DRAPES) ×2 IMPLANT
DRAPE WARM FLUID 44X44 (DRAPE) ×3 IMPLANT
DRILL BIT 7/64X5 (BIT) IMPLANT
ELECT REM PT RETURN 9FT ADLT (ELECTROSURGICAL)
ELECTRODE REM PT RTRN 9FT ADLT (ELECTROSURGICAL) ×1 IMPLANT
GLOVE SURG SIGNA 7.5 PF LTX (GLOVE) ×6 IMPLANT
GOWN BRE IMP PREV XXLGXLNG (GOWN DISPOSABLE) ×3 IMPLANT
GOWN STRL NON-REIN LRG LVL3 (GOWN DISPOSABLE) ×6 IMPLANT
HANDLE STAPLE ENDO GIA SHORT (STAPLE)
HEMOSTAT SURGICEL 2X14 (HEMOSTASIS) IMPLANT
KIT BASIN OR (CUSTOM PROCEDURE TRAY) ×3 IMPLANT
KIT ROOM TURNOVER OR (KITS) ×1 IMPLANT
LIGASURE 5MM LAPAROSCOPIC (INSTRUMENTS) IMPLANT
NS IRRIG 1000ML POUR BTL (IV SOLUTION) ×2 IMPLANT
PACK CHEST (CUSTOM PROCEDURE TRAY) ×3 IMPLANT
PAD ARMBOARD 7.5X6 YLW CONV (MISCELLANEOUS) IMPLANT
SEALANT PROGEL (MISCELLANEOUS) IMPLANT
SEALANT SURG COSEAL 4ML (VASCULAR PRODUCTS) IMPLANT
SEALANT SURG COSEAL 8ML (VASCULAR PRODUCTS) IMPLANT
SOLUTION ANTI FOG 6CC (MISCELLANEOUS) ×3 IMPLANT
SPONGE GAUZE 4X4 12PLY (GAUZE/BANDAGES/DRESSINGS) ×1 IMPLANT
STAPLER ENDO GIA 12 SHRT THIN (STAPLE) IMPLANT
STAPLER ENDO GIA 12MM SHORT (STAPLE) IMPLANT
STRIP PERI DRY VERITAS 45 (STAPLE) IMPLANT
STRIP PERI DRY VERITAS 60 (STAPLE) IMPLANT
SUT PROLENE 3 0 SH DA (SUTURE) IMPLANT
SUT PROLENE 4 0 RB 1 (SUTURE)
SUT PROLENE 4-0 RB1 .5 CRCL 36 (SUTURE) IMPLANT
SUT SILK 0 FSL (SUTURE) ×8 IMPLANT
SUT SILK 2 0SH CR/8 30 (SUTURE) IMPLANT
SUT VIC AB 1 CTX 18 (SUTURE) IMPLANT
SUT VIC AB 1 CTX 36 (SUTURE)
SUT VIC AB 1 CTX36XBRD ANBCTR (SUTURE) IMPLANT
SUT VIC AB 2-0 CTX 36 (SUTURE) IMPLANT
SUT VIC AB 3-0 MH 27 (SUTURE) IMPLANT
SUT VIC AB 3-0 X1 27 (SUTURE) ×1 IMPLANT
SUT VICRYL 2 TP 1 (SUTURE) IMPLANT
SYSTEM SAHARA CHEST DRAIN RE-I (WOUND CARE) ×1 IMPLANT
TAPE CLOTH 4X10 WHT NS (GAUZE/BANDAGES/DRESSINGS) ×1 IMPLANT
TAPE UMBILICAL COTTON 1/8X30 (MISCELLANEOUS) IMPLANT
TIP APPLICATOR SPRAY EXTEND 16 (VASCULAR PRODUCTS) IMPLANT
TOWEL OR 17X24 6PK STRL BLUE (TOWEL DISPOSABLE) ×2 IMPLANT
TOWEL OR 17X26 10 PK STRL BLUE (TOWEL DISPOSABLE) ×6 IMPLANT
TRAY FOLEY CATH 14FRSI W/METER (CATHETERS) ×1 IMPLANT
TUNNELER SHEATH ON-Q 11GX8 (MISCELLANEOUS) IMPLANT
WATER STERILE IRR 1000ML POUR (IV SOLUTION) IMPLANT

## 2012-01-08 NOTE — Op Note (Signed)
NAMEOCTAVION, MOLLENKOPF NO.:  [id number]  MEDICAL RECORD NO.:  [id number]  LOCATION:  [facility]                         FACILITY:  [facility]  PHYSICIAN:  Ines Bloomer, M.D. DATE OF BIRTH:  11/21/1932  DATE OF PROCEDURE: DATE OF DISCHARGE:                              OPERATIVE REPORT   PREOPERATIVE DIAGNOSES: 1. Empyema, right chest. 2. Anemia.  POSTOPERATIVE DIAGNOSES: 1. Empyema, right chest. 2. Anemia.  OPERATION PERFORMED:  Insertion of right chest tube.  SURGEON:  Ines Bloomer, M.D.  ANESTHESIA:  Versed 3 mg and lidocaine.  This patient had a hematocrit of 39 and when thoracentesis, it dropped to 27.  He had a loculated effusion.  We decided to try to drain this with a chest tube.  The right chest was prepped and draped in usual sterile manner.  Area was infiltrated with 1% Xylocaine at the 9th intercostal space at the posterior axillary line.  A 2-cm incision was made and dissection was carried down through the 8th intercostal space. The 8th intercostal space was entered and we drained out about 300 mL of old blood.  We advanced the #32 chest tube and sutured in place with 0 silk and was placed on suction.     Ines Bloomer, M.D.     DPB/MEDQ  D:  01/08/2012  T:  01/08/2012  Job:  [id number]

## 2012-01-08 NOTE — Progress Notes (Addendum)
MEDICATION RELATED CONSULT NOTE - INITIAL  ANTIBIOTIC CONSULT NOTE - FOLLOW UP   Pharmacy Consult:  Aranesp Indication:  Anemia  Allergies  Allergen Reactions  . Ace Inhibitors Other (See Comments)    REACTION: Angioedema  . Lisinopril Swelling and Other (See Comments)    REACTION: Facial Swelling    Patient Measurements: Height: 6\' 3"  (190.5 cm) Weight: 172 lb 13.5 oz (78.4 kg) IBW/kg (Calculated) : 84.5  Vital Signs: Temp: 98.9 F (37.2 C) (06/21 0515) Temp src: Oral (06/21 0515) BP: 130/79 mmHg (06/21 0515) Pulse Rate: 76  (06/21 0515) Intake/Output from previous day: 06/20 0701 - 06/21 0700 In: 1640 [P.O.:240; I.V.:1200; IV Piggyback:200] Out: 425 [Urine:425] Intake/Output from this shift: Total I/O In: 200 [I.V.:200] Out: -   Labs:  Basename 01/07/12 1246  WBC 33.9*  HGB 10.2*  HCT 27.1*  PLT 537*  APTT 43*  CREATININE 1.04  LABCREA --  CREATININE 1.04  CREAT24HRUR --  MG --  PHOS --  ALBUMIN 1.5*  PROT 6.4  ALBUMIN 1.5*  AST 13  ALT 8  ALKPHOS 208*  BILITOT 0.6  BILIDIR --  IBILI --   Estimated Creatinine Clearance: 66 ml/min (by C-G formula based on Cr of 1.04).   Microbiology: Recent Results (from the past 720 hour(s))  BODY FLUID CULTURE     Status: Normal (Preliminary result)   Collection Time   01/04/12 12:03 PM      Component Value Range Status Comment   Specimen Description FLUID RT CHEST   Final    Special Requests NONE   Final    Gram Stain     Final    Value: NO WBC SEEN     NO ORGANISMS SEEN   Culture Culture reincubated for better growth   Final    Report Status PENDING   Incomplete   AFB CULTURE WITH SMEAR     Status: Normal (Preliminary result)   Collection Time   01/04/12 12:03 PM      Component Value Range Status Comment   Specimen Description FLUID CHEST   Final    Special Requests NONE   Final    ACID FAST SMEAR NO ACID FAST BACILLI SEEN   Final    Culture     Final    Value: CULTURE WILL BE EXAMINED FOR 6 WEEKS  BEFORE ISSUING A FINAL REPORT   Report Status PENDING   Incomplete   FUNGUS CULTURE W SMEAR     Status: Normal (Preliminary result)   Collection Time   01/04/12 12:03 PM      Component Value Range Status Comment   Specimen Description FLUID CHEST RIGHT   Final    Special Requests NONE   Final    Fungal Smear NO YEAST OR FUNGAL ELEMENTS SEEN   Final    Culture CULTURE IN PROGRESS FOR FOUR WEEKS   Final    Report Status PENDING   Incomplete   MRSA PCR SCREENING     Status: Normal   Collection Time   01/07/12 10:54 PM      Component Value Range Status Comment   MRSA by PCR NEGATIVE  NEGATIVE Final     Medical History: Past Medical History  Diagnosis Date  . Anemia     NOS  . Arthritis   . Gout   . Hyperlipidemia   . Hypertension   . GERD (gastroesophageal reflux disease)   . Elevated PSA     multiple times, refused urology eval [facility] notes)  .  ETOH abuse   . Medically noncompliant     noncompliant with follow ups, labs.  . Alcoholism 06/25/2011       Assessment: 47 YOM with empyema to undergo decortication.  He is a TEFL teacher witness and requires ESA as expect his hemoglobin to drop with surgery.  Patient currently has anemia and his hgb is trending down (13.3 ==> 10.2).  Patient also on broad spectrum antibiotic for empyema.  With improved renal function, will adjust vanc dose.   Goal of Therapy:  Hgb >/= 11 g/dL Vanc trough 16-10 mcg/mL    Plan:  - Continue Aranesp 25 mcg SQ qFriday x up to 4 doses - Monitor hgb and d/c Aranesp post 4 doses or when hgb consistently > 11 g/dL - Check iron panel along with labs already ordered for 01/08/12 AM - Change vanc to 1000mg  IV Q12H - Continue Zosyn 3.375gm IV Q8H, 4 hr infusion - Monitor renal fxn, vanc trough if indicated, lytes     Oluwatobiloba Martin D. Laney Potash, PharmD, BCPS Pager:  [phone] 01/08/2012, 8:51 AM

## 2012-01-08 NOTE — Progress Notes (Signed)
Patient to O.R. Via monitor with nurse and wife . Patient is [name] And refuses blood products. Wife has all belongings.

## 2012-01-08 NOTE — Progress Notes (Addendum)
Asked to assist with patient needing bedside chest tube insertion and MD requesting to use Versed for procedure. On arrival patient in bed = turned on left side.  Alert NAD w/d resps regular and unlabored - rate 20 O2 sats 97% on RA.  Baseline BP 127/69 HR 85 - SR with occ PVC's.  bil BS present - decreased on right.  NAD.  Placed on 2 liter nasal cannula - suction set up in room - on monitor with HR, BP and O2 sats.  3mg  Versed IV given by Lestine Mount per Dr. Edwyna Shell orders.  Tol procedure well.  See VS below.  Patient returned to baseline VS and airway clearance - handoff to [facility].  To call for further needs.  0908:  127/69  SR 85  RR 20  O2 sats 97%  0910  118/78  SR 88  RR 16  Sats 96%  0915  116/88  SR 98 RR 18  Sats 96%  0920  116/68  SR 84  RR 18  Sats 98%  0930  120/68  SR 84  RR 18  Sats 98%  0940  122/69  SR 85  RR 18  Sats 97%  Patient awake - airway clear - answers questions appropriately - RR reg and unlabored.  Good cough.    Radiology present - CXR done.  Bil BS present - remains decreased right side.  No crepitus.

## 2012-01-08 NOTE — H&P (View-Only) (Signed)
                                                Procedure(s) (LRB): VIDEO ASSISTED THORACOSCOPY (VATS)/EMPYEMA (Right) Subjective: Patient was admitted as a right loculated effusion. White count is 34,000. 2-D echo is pending. Feel the patient has an empyema in the right chest. Antibiotic are started plan to do a decortication tomorrow. Patient understands risk and benefits and agrees to the surgery.  Objective: Vital signs in last 24 hours: Temp:  [98 F (36.7 C)-98.1 F (36.7 C)] 98 F (36.7 C) (06/20 1343) Pulse Rate:  [91-96] 91  (06/20 1343) Cardiac Rhythm:  [-]  Resp:  [18] 18  (06/20 1343) BP: (101-105)/(62-64) 101/62 mmHg (06/20 1343) SpO2:  [95 %-96 %] 95 % (06/20 1343) Weight:  [173 lb 4.5 oz (78.6 kg)] 173 lb 4.5 oz (78.6 kg) (06/20 1058)  Hemodynamic parameters for last 24 hours:    Intake/Output from previous day:   Intake/Output this shift: Total I/O In: -  Out: 100 [Urine:100]  General appearance: alert Heart: regularly irregular rhythm Lungs: diminished breath sounds RML and RUL Abdomen: soft, non-tender; bowel sounds normal; no masses,  no organomegaly  Lab Results:  Basename 01/07/12 1246  WBC 33.9*  HGB 10.2*  HCT 27.1*  PLT 537*   BMET:  Basename 01/07/12 1246  NA 128*  K 3.0*  CL 91*  CO2 24  GLUCOSE 90  BUN 24*  CREATININE 1.04  CALCIUM 8.0*    PT/INR:  Basename 01/07/12 1246  LABPROT 14.2  INR 1.08   ABG    Component Value Date/Time   PHART 7.501* 01/07/2012 1345   HCO3 25.1* 01/07/2012 1345   TCO2 26.1 01/07/2012 1345   O2SAT 93.0 01/07/2012 1345   CBG (last 3)  No results found for this basename: GLUCAP:3 in the last 72 hours  Assessment/Plan: S/P Procedure(s) (LRB): VIDEO ASSISTED THORACOSCOPY (VATS)/EMPYEMA (Right) Plan decortication tomorrow if risk and benefits explained to patient patient agrees to surgery.   LOS: 0 days    Tory Septer PATRICK 01/07/2012   

## 2012-01-08 NOTE — Brief Op Note (Signed)
01/07/2012 - 01/08/2012  9:21 AM  PATIENT:  Bob Wilson  77 y.o. male  PRE-OPERATIVE DIAGNOSIS:  Right EMPYEMA  POST-OPERATIVE DIAGNOSIS:  Same possible Hemothorax PROCEDURE:  Procedure(s) (LRB): VIDEO ASSISTED THORACOSCOPY (VATS)/EMPYEMA (Right)  SURGEON:  Surgeon(s) and Role:    * Ines Bloomer, MD - Primary  PHYSICIAN ASSISTANT:   ASSISTANTS: none   ANESTHESIA:   none  EBL:  Total I/O In: 200 [I.V.:200] Out: -   BLOOD ADMINISTERED:none  DRAINS: right Chest Tube(s) in the pleura   LOCAL MEDICATIONS USED:  XYLOCAINE   SPECIMEN:  No Specimen  DISPOSITION OF SPECIMEN:  N/A  COUNTS:  YES  TOURNIQUET:  * No tourniquets in log *  DICTATION: .Other Dictation: Dictation Number [id number]  PLAN OF CARE: Admit to inpatient   PATIENT DISPOSITION:  2000   Delay start of Pharmacological VTE agent (>24hrs) due to surgical blood loss or risk of bleeding: yes

## 2012-01-08 NOTE — Progress Notes (Signed)
The patient was brought to the operating room. At that time I was informed that the patient was a Jehovah's Witness. He refused all blood. We ordered blood yesterday but the patient refused to have the blood set up. I was not informed that this. 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 His hematocrit is 27. I feel that cannot operate on him ifblood wasn't available, as he will possibly need transfusion because of his low hematocrit. I explained this to the patient and his wife. I will call  his medical doctor Dr. Maple Hudson to assume his care. I will start Epogen as soon as possible. I explained to the wife that a chest tube could be tried as a possible alternative to VATS drainage, but is not usually successful in an empyema.

## 2012-01-08 NOTE — Transfer of Care (Signed)
Immediate Anesthesia Transfer of Care Note  Patient: Bob Wilson  Procedure(s) Performed: Procedure(s) (LRB): VIDEO ASSISTED THORACOSCOPY (VATS)/EMPYEMA (Right)  Patient Location:   Anesthesia Type:   Level of Consciousness:   Airway & Oxygen Therapy:    Case cancelled  Post-op Assessment:   Post vital signs:   Complications:

## 2012-01-08 NOTE — Interval H&P Note (Signed)
History and Physical Interval Note:  01/08/2012 7:05 AM  Nichola Sizer  has presented today for surgery, with the diagnosis of EMPYEMA  The various methods of treatment have been discussed with the patient and family. After consideration of risks, benefits and other options for treatment, the patient has consented to  Procedure(s) (LRB): VIDEO ASSISTED THORACOSCOPY (VATS)/EMPYEMA (Right) as a surgical intervention .  The patient's history has been reviewed, patient examined, no change in status, stable for surgery.  I have reviewed the patients' chart and labs.  Questions were answered to the patient's satisfaction.     Cameron Proud

## 2012-01-08 NOTE — Preoperative (Signed)
Beta Blockers   Reason not to administer Beta Blockers:Not Applicable 

## 2012-01-08 NOTE — Progress Notes (Addendum)
Pt requesting MD be paged regarding ordering GERD medicine; pt takes Zantac at home; will page PA; awaiting callback.

## 2012-01-09 ENCOUNTER — Inpatient Hospital Stay (HOSPITAL_COMMUNITY): Payer: Medicare Other

## 2012-01-09 DIAGNOSIS — J90 Pleural effusion, not elsewhere classified: Secondary | ICD-10-CM

## 2012-01-09 DIAGNOSIS — D649 Anemia, unspecified: Secondary | ICD-10-CM

## 2012-01-09 LAB — IRON AND TIBC
Iron: <10 ug/dL — ABNORMAL LOW (ref 42–135)
UIBC: 115 ug/dL — ABNORMAL LOW (ref 125–400)

## 2012-01-09 LAB — CBC
HCT: 32.9 % — ABNORMAL LOW (ref 39.0–52.0)
Platelets: 531 10*3/uL — ABNORMAL HIGH (ref 150–400)
RDW: 16 % — ABNORMAL HIGH (ref 11.5–15.5)
WBC: 28.2 10*3/uL — ABNORMAL HIGH (ref 4.0–10.5)

## 2012-01-09 MED ORDER — TRAMADOL HCL 50 MG PO TABS
50.0000 mg | ORAL_TABLET | Freq: Four times a day (QID) | ORAL | Status: DC | PRN
  Administered 2012-01-09 – 2012-01-11 (×2): 50 mg via ORAL
  Filled 2012-01-09 (×2): qty 1

## 2012-01-09 MED ORDER — POLYSACCHARIDE IRON COMPLEX 150 MG PO CAPS
150.0000 mg | ORAL_CAPSULE | Freq: Every day | ORAL | Status: DC
  Administered 2012-01-09 – 2012-01-19 (×10): 150 mg via ORAL
  Filled 2012-01-09 (×11): qty 1

## 2012-01-09 MED ORDER — LORAZEPAM 2 MG/ML IJ SOLN: 1.0000 mg | Freq: Four times a day (QID) | INTRAMUSCULAR | Status: AC | PRN

## 2012-01-09 MED ORDER — LORAZEPAM 1 MG PO TABS
1.0000 mg | ORAL_TABLET | Freq: Four times a day (QID) | ORAL | Status: AC | PRN
  Filled 2012-01-09: qty 1

## 2012-01-09 MED ORDER — POTASSIUM CHLORIDE CRYS ER 20 MEQ PO TBCR
20.0000 meq | EXTENDED_RELEASE_TABLET | Freq: Two times a day (BID) | ORAL | Status: DC
  Administered 2012-01-09 – 2012-01-10 (×5): 20 meq via ORAL
  Filled 2012-01-09 (×6): qty 1

## 2012-01-09 MED ORDER — FOLIC ACID 1 MG PO TABS
1.0000 mg | ORAL_TABLET | Freq: Every day | ORAL | Status: DC
  Administered 2012-01-09 – 2012-01-19 (×11): 1 mg via ORAL
  Filled 2012-01-09 (×11): qty 1

## 2012-01-09 MED ORDER — OXYCODONE-ACETAMINOPHEN 5-325 MG PO TABS: 2.0000 | ORAL_TABLET | Freq: Four times a day (QID) | ORAL | Status: DC | PRN

## 2012-01-09 MED ORDER — THIAMINE HCL 100 MG/ML IJ SOLN
100.0000 mg | Freq: Every day | INTRAMUSCULAR | Status: DC
  Filled 2012-01-09 (×3): qty 1

## 2012-01-09 MED ORDER — ADULT MULTIVITAMIN W/MINERALS CH
1.0000 | ORAL_TABLET | Freq: Every day | ORAL | Status: DC
  Filled 2012-01-09: qty 1

## 2012-01-09 MED ORDER — KETOROLAC TROMETHAMINE 30 MG/ML IJ SOLN
30.0000 mg | Freq: Once | INTRAMUSCULAR | Status: DC
  Filled 2012-01-09: qty 1

## 2012-01-09 MED ORDER — VITAMIN B-1 100 MG PO TABS
100.0000 mg | ORAL_TABLET | Freq: Every day | ORAL | Status: DC
  Administered 2012-01-09 – 2012-01-19 (×11): 100 mg via ORAL
  Filled 2012-01-09 (×11): qty 1

## 2012-01-09 NOTE — Progress Notes (Addendum)
1 Day Post-Op Procedure(s) (LRB): VIDEO ASSISTED THORACOSCOPY (VATS)/EMPYEMA (Right) Subjective: [name] complains of pain this morning.  He states he is taking pain medication but it is not helping his pain.  His nurse stopped me in the hallway stating patient is an alcoholic and she is concerned he is starting to show some signs of withdrawal and request ativan taper be placed.   Objective: Vital signs in last 24 hours: Temp:  [97.6 F (36.4 C)-100.8 F (38.2 C)] 98.5 F (36.9 C) (06/22 0507) Pulse Rate:  [84-93] 92  (06/22 0507) Cardiac Rhythm:  [-] Normal sinus rhythm (06/21 1935) Resp:  [16-18] 18  (06/22 0507) BP: (107-121)/(62-88) 119/62 mmHg (06/22 0507) SpO2:  [93 %-100 %] 93 % (06/22 0507) Weight:  [156 lb 8.4 oz (71 kg)] 156 lb 8.4 oz (71 kg) (06/22 0507)  Intake/Output from previous day: 06/21 0701 - 06/22 0700 In: 965 [I.V.:965] Out: 760 [Urine:650; Chest Tube:110]    General appearance: alert, cooperative and patient appears to be uncomfortable Heart: regular rate and rhythm Lungs: diminished breath sounds RML and RUL Abdomen: soft, non-tender; bowel sounds normal; no masses,  no organomegaly Wound: clean and dry  Lab Results:  Basename 01/09/12 0700 01/07/12 1246  WBC 28.2* 33.9*  HGB 11.2* 10.2*  HCT 32.9* 27.1*  PLT 531* 537*   BMET:  Basename 01/07/12 1246  NA 128*  K 3.0*  CL 91*  CO2 24  GLUCOSE 90  BUN 24*  CREATININE 1.04  CALCIUM 8.0*    PT/INR:  Basename 01/07/12 1246  LABPROT 14.2  INR 1.08   ABG    Component Value Date/Time   PHART 7.501* 01/07/2012 1345   HCO3 25.1* 01/07/2012 1345   TCO2 26.1 01/07/2012 1345   O2SAT 93.0 01/07/2012 1345   CBG (last 3)  No results found for this basename: GLUCAP:3 in the last 72 hours  Assessment/Plan: S/P Procedure(s) (LRB): VIDEO ASSISTED THORACOSCOPY (VATS)/EMPYEMA (Right)  2. Chest tube- minimal drainage, per Dr. Edwyna Shell D/C chest tube 3. Pain control- on Norco Q6 prn, will add  tramadol to regimen and give one time dose of Toradol 4. Alcohol withdrawal- per nurse, will order Ativan taper 5. Anemia- Hgb stable at 11.2, patient is Jehovah's witness is not willing to receive blood products will follow 6. Dispo- will follow   LOS: 2 days    Lowella Dandy 01/09/2012

## 2012-01-09 NOTE — Progress Notes (Signed)
                                              1 Day Post-Op Procedure(s) (LRB): VIDEO ASSISTED THORACOSCOPY (VATS)/EMPYEMA (Right) Subjective: Patient's hematocrit is 32 today. White count is 24,000 which is down minimal drainage from the chest tube. Will discontinue chest tube. Repeat lab on Monday. consider a VATS drainage. Objective: Vital signs in last 24 hours: Temp:  [97.6 F (36.4 C)-100.8 F (38.2 C)] 98.5 F (36.9 C) (06/22 0507) Pulse Rate:  [84-93] 92  (06/22 0507) Cardiac Rhythm:  [-] Normal sinus rhythm (06/21 1935) Resp:  [16-18] 18  (06/22 0507) BP: (107-121)/(62-88) 119/62 mmHg (06/22 0507) SpO2:  [93 %-100 %] 93 % (06/22 0507) Weight:  [71 kg (156 lb 8.4 oz)] 71 kg (156 lb 8.4 oz) (06/22 0507)  Hemodynamic parameters for last 24 hours:    Intake/Output from previous day: 06/21 0701 - 06/22 0700 In: 965 [I.V.:965] Out: 760 [Urine:650; Chest Tube:110] Intake/Output this shift:    General appearance: alert Lungs: diminished breath sounds RLL  Lab Results:  Basename 01/09/12 0700 01/07/12 1246  WBC 28.2* 33.9*  HGB 11.2* 10.2*  HCT 32.9* 27.1*  PLT 531* 537*   BMET:  Basename 01/07/12 1246  NA 128*  K 3.0*  CL 91*  CO2 24  GLUCOSE 90  BUN 24*  CREATININE 1.04  CALCIUM 8.0*    PT/INR:  Basename 01/07/12 1246  LABPROT 14.2  INR 1.08   ABG    Component Value Date/Time   PHART 7.501* 01/07/2012 1345   HCO3 25.1* 01/07/2012 1345   TCO2 26.1 01/07/2012 1345   O2SAT 93.0 01/07/2012 1345   CBG (last 3)  No results found for this basename: GLUCAP:3 in the last 72 hours  Assessment/Plan: S/P Procedure(s) (LRB): VIDEO ASSISTED THORACOSCOPY (VATS)/EMPYEMA (Right) Discontinue chest tube   LOS: 2 days    Bob Wilson [facility] 01/09/2012

## 2012-01-09 NOTE — Progress Notes (Signed)
PULMONARY/CCM CONSULT NOTE  Requesting MD/Service: Burney/TCTS Date of admission: 6/20  Date of consult: 6/20 Reason for consultation: peri-op med mgmt  Pt Profile:  79 yobm initially seen in consultation on 6/14 by Dr Maple Hudson for eval of L lung nodule and large R effusion. An US guided thoracentesis was performed by IR on 6/17 and 450 cc of foul smelling cloudy fluid consistent with empyema was removed > admitted 6/20 by TCTS service in preparation for VATS drainage of R pleural space planned for 6/21   Subjective/overnight Pain at chest tube site, no cough or sob on RA   Filed Vitals:   01/08/12 1003 01/08/12 1328 01/08/12 2103 01/09/12 0507  BP: 109/88 107/67 112/69 119/62  Pulse: 84 89 93 92  Temp:  97.6 F (36.4 C) 100.8 F (38.2 C) 98.5 F (36.9 C)  TempSrc:  Oral Oral Oral  Resp: 18 18 18 18   Height:      Weight:    156 lb 8.4 oz (71 kg)  SpO2: 100% 97% 95% 93%    EXAM:  Gen: WDWN in NAD HEENT: very poor dentition Neck:  No JVD, no LAN Lungs: dull to percussion with bronchial BS approx 1/2 up on R. Clear on L Cardiovascular: RRR s M Abdomen: NABS, soft, NT Ext: 1-2 symmetric edema Neuro: no focal deficits   DATA:  pcxr  6/22  >mod R effusion   Lab 01/07/12 1246  NA 128*  K 3.0*  CL 91*  CO2 24  BUN 24*  CREATININE 1.04  GLUCOSE 90    Lab 01/09/12 0700 01/07/12 1246  HGB 11.2* 10.2*  HCT 32.9* 27.1*  WBC 28.2* 33.9*  PLT 531* 537*     EKG: NSR, LAD, RBBB, no ischemia    IMPRESSION/RECS:    Empyema (01/07/2012)   Assessment: description of fluid (foul smelling) suggests an anaerobic infection likely related to poor dentition and aspiration (noting history of excessive alcohol use) with culture 6/17 confirmatory this is microaerophic strep > Zoysn adequate started 6/20    Plan:  D/C Vanc 6/22  VATS planned 6/24  Pre-operative respiratory examination (01/07/2012)   Assessment: Other than correcting electrolytes, there are no major interventions  to be made that will reduce his peri-operative risk. There are no contra-indications to proceeding with surgery. Biggest concern is risk for major EtOH withdrawal syndrome post-operatively   Plan: PCCM will follow post-operatively  Alcohol abuse (01/07/2012)   Assessment: No prior history of DTs   Plan: PRN Lorazepam ordered. Monitor closely post op. Consider CIWA protocol if indicated  Hypokalemia (01/07/2012)   Assessment: Likely related to lasix use   Plan: Repletion ordered by primary service  Hyponatremia (01/07/2012) - mild   Assessment: Likely related to diuresis and volume overload   Plan: Monitor post-operatively  L lung nodule (01/07/2012)   Assessment: appearance worrisome for malignancy. Needs eval after the more pressing issue of empyema is addressed   Plan: f/u with Dr Maple Hudson post hospitalization     We will see post op and in interim prn  Sandrea Hughs, MD Pulmonary and Critical Care Medicine [facility] [phone]

## 2012-01-10 ENCOUNTER — Inpatient Hospital Stay (HOSPITAL_COMMUNITY): Payer: Medicare Other

## 2012-01-10 LAB — FERRITIN: Ferritin: 700 ng/mL — ABNORMAL HIGH (ref 22–322)

## 2012-01-10 NOTE — Progress Notes (Signed)
                                              2 Days Post-Op Procedure(s) (LRB): VIDEO ASSISTED THORACOSCOPY (VATS)/EMPYEMA (Right) Subjective: Stable./ CXR unchanged. Will need Vats drainage.Repeat lab in am. If his hematocrit is stable, I plan to drain his empyema tomorrow. Risk of the procedure were explained to the patient. He understands that if there is bleeding as we will not giving blood and this may compromise the recovery and possibly leading to death.  Objective: Vital signs in last 24 hours: Temp:  [98.5 F (36.9 C)-99.2 F (37.3 C)] 98.6 F (37 C) (06/23 0554) Pulse Rate:  [77-85] 85  (06/23 0554) Cardiac Rhythm:  [-] Normal sinus rhythm (06/23 0730) Resp:  [18-20] 20  (06/23 0554) BP: (109-126)/(69-77) 123/77 mmHg (06/23 0554) SpO2:  [95 %-98 %] 95 % (06/23 0554)  Hemodynamic parameters for last 24 hours:    Intake/Output from previous day: 06/22 0701 - 06/23 0700 In: 480 [P.O.:480] Out: 600 [Urine:600] Intake/Output this shift:    General appearance: alert Heart: regular rate and rhythm, S1, S2 normal, no murmur, click, rub or gallop Lungs: diminished breath sounds RLL  Lab Results:  Basename 01/09/12 0700 01/07/12 1246  WBC 28.2* 33.9*  HGB 11.2* 10.2*  HCT 32.9* 27.1*  PLT 531* 537*   BMET:  Basename 01/07/12 1246  NA 128*  K 3.0*  CL 91*  CO2 24  GLUCOSE 90  BUN 24*  CREATININE 1.04  CALCIUM 8.0*    PT/INR:  Basename 01/07/12 1246  LABPROT 14.2  INR 1.08   ABG    Component Value Date/Time   PHART 7.501* 01/07/2012 1345   HCO3 25.1* 01/07/2012 1345   TCO2 26.1 01/07/2012 1345   O2SAT 93.0 01/07/2012 1345   CBG (last 3)  No results found for this basename: GLUCAP:3 in the last 72 hours  Assessment/Plan: S/P Procedure(s) (LRB): VIDEO ASSISTED THORACOSCOPY (VATS)/EMPYEMA (Right) See orders   LOS: 3 days    Bob Wilson [facility] 01/10/2012

## 2012-01-11 ENCOUNTER — Encounter (HOSPITAL_COMMUNITY)
Admission: RE | Disposition: A | Discharge status: Home / Self Care | Payer: Self-pay | Source: Ambulatory Visit | Attending: Internal Medicine

## 2012-01-11 ENCOUNTER — Encounter (HOSPITAL_COMMUNITY): Payer: Self-pay | Admitting: Oncology

## 2012-01-11 ENCOUNTER — Inpatient Hospital Stay (HOSPITAL_COMMUNITY): Payer: Medicare Other

## 2012-01-11 ENCOUNTER — Other Ambulatory Visit: Payer: Self-pay | Admitting: Radiology

## 2012-01-11 DIAGNOSIS — J869 Pyothorax without fistula: Secondary | ICD-10-CM

## 2012-01-11 DIAGNOSIS — F1020 Alcohol dependence, uncomplicated: Secondary | ICD-10-CM

## 2012-01-11 DIAGNOSIS — R0602 Shortness of breath: Secondary | ICD-10-CM

## 2012-01-11 LAB — BASIC METABOLIC PANEL
BUN: 20 mg/dL (ref 6–23)
Creatinine, Ser: 1.17 mg/dL (ref 0.50–1.35)
GFR calc Af Amer: 68 mL/min — ABNORMAL LOW (ref >90)
GFR calc non Af Amer: 58 mL/min — ABNORMAL LOW (ref >90)
Glucose, Bld: 76 mg/dL (ref 70–99)

## 2012-01-11 LAB — CBC
HCT: 25.9 % — ABNORMAL LOW (ref 39.0–52.0)
MCHC: 36.7 g/dL — ABNORMAL HIGH (ref 30.0–36.0)
MCV: 89 fL (ref 78.0–100.0)
RDW: 15.7 % — ABNORMAL HIGH (ref 11.5–15.5)

## 2012-01-11 LAB — LACTATE DEHYDROGENASE: LDH: 199 U/L (ref 94–250)

## 2012-01-11 SURGERY — VIDEO ASSISTED THORACOSCOPY (VATS)/EMPYEMA
Anesthesia: General | Laterality: Right

## 2012-01-11 NOTE — Progress Notes (Signed)
PULMONARY/CCM CONSULT NOTE  Requesting MD/Service: Burney/TCTS Date of admission: 6/20  Date of consult: 6/20 Reason for consultation: peri-op med mgmt  Pt Profile:  79 yobm initially seen in consultation on 6/14 by Dr Maple Hudson for eval of L lung nodule and large R effusion. An US guided thoracentesis was performed by IR on 6/17 and 450 cc of foul smelling cloudy fluid consistent with empyema was removed > admitted 6/20 by TCTS service in preparation for VATS drainage of R pleural space planned for 6/21   Subjective/overnight Pain at chest tube site, no cough or sob on RA  STAFF Note: Patient now on PCCM service. IR plans pig tail on 6/25. Due to drop in hgb Dr Edwyna Shell deferred VATS   Filed Vitals:   01/10/12 0554 01/10/12 1446 01/10/12 2100 01/11/12 0500  BP: 123/77 132/74 122/72 143/84  Pulse: 85 89 95 90  Temp: 98.6 F (37 C) 98.4 F (36.9 C) 98 F (36.7 C) 98.5 F (36.9 C)  TempSrc: Oral Oral    Resp: 20 18 18 18   Height:      Weight:    182 lb 12.2 oz (82.9 kg)  SpO2: 95% 97% 96% 96%    EXAM:  Gen: WDWN in NAD HEENT: very poor dentition Neck:  No JVD, no LAN Lungs: dull to percussion with bronchial BS approx 1/2 up on R. Clear on L Cardiovascular: RRR s M Abdomen: NABS, soft, NT Ext: 1-2 symmetric edema Neuro: no focal deficits   DATA:  Dg Chest Port 1 View  01/11/2012  *RADIOLOGY REPORT*  Clinical Data: Follow-up for empyema status post VATS.  PORTABLE CHEST - 1 VIEW  Comparison: Chest x-ray 01/10/2012.  Findings: Again noted is a large right-sided pleural effusion, with associated regions of atelectasis and/or consolidation in the portions of the right lung, particularly in the right middle and lower lobes.  Left lung is well-aerated.  No definite left-sided pleural effusion.  Pulmonary vasculature appears normal.  Heart size is normal.  IMPRESSION: 1.  Allowing for slight differences in patient positioning, the radiographic appearance of the chest is essentially  unchanged, as detailed above.  Original Report Authenticated By: Florencia Reasons, M.D.   Dg Chest Port 1 View  01/10/2012  *RADIOLOGY REPORT*  Clinical Data: 78 year old male with empyema.  PORTABLE CHEST - 1 VIEW  Comparison: 01/09/2012 and prior chest radiographs  Findings: The cardiomediastinal silhouette is stable. A loculated right pleural effusion/empyema is unchanged as well as right mid and lower lung atelectasis. There is no evidence of pneumothorax. The left lung is clear.  IMPRESSION: Unchanged chest radiograph with right loculated effusion/empyema and associated atelectasis.  Original Report Authenticated By: Rosendo Gros, M.D.   Dg Chest Port 1 View  01/09/2012  *RADIOLOGY REPORT*  Clinical Data: Right chest tube removal  PORTABLE CHEST - 1 VIEW  Comparison: 01/08/2012  Findings: Right chest tube has been removed.  Moderately large right pleural effusion is unchanged with right lower lobe airspace disease.  No pneumothorax.  Small left effusion.  No heart failure.  IMPRESSION: No pneumothorax post right chest tube removal.  There remains a moderately large right pleural effusion.  Original Report Authenticated By: Camelia Phenes, M.D.      Lab 01/11/12 0505 01/07/12 1246  NA 134* 128*  K 5.3* 3.0*  CL 101 91*  CO2 24 24  BUN 20 24*  CREATININE 1.17 1.04  GLUCOSE 76 90    Lab 01/11/12 0505 01/09/12 0700 01/07/12 1246  HGB 9.5*  11.2* 10.2*  HCT 25.9* 32.9* 27.1*  WBC 31.4* 28.2* 33.9*  PLT 715* 531* 537*     EKG: NSR, LAD, RBBB, no ischemia    IMPRESSION/RECS:    Empyema (01/07/2012)   Assessment: description of fluid (foul smelling) suggests an anaerobic infection likely related to poor dentition and aspiration (noting history of excessive alcohol use) with culture 6/17 confirmatory this is microaerophic strep > Zoysn adequate started 6/20    Plan:   D/C Vanc 6/22  STaff comment:  VATS canceled due to risk of bleeding and patient refusal to accept prbc IR  guided pig tail planned 6/25  Patient on PCCM service since 6/24   Alcohol abuse (01/07/2012)   Assessment: No prior history of DTs   Plan: PRN Lorazepam ordered. Monitor closely post op. Consider CIWA protocol if indicated   L lung nodule (01/07/2012)   Assessment: appearance worrisome for malignancy. Needs eval after the more pressing issue of empyema is addressed   Plan: f/u with Dr Maple Hudson post hospitalization  Anemia of chronic disease -- Bedelia Person witness; will not accept prbc  Lab 01/11/12 0505 01/09/12 0700 01/07/12 1246  HGB 9.5* 11.2* 10.2*   No obvious bleed on 6/25. Agree with Heme that likely reflects acute illness  PLAN  - aranesp   - adopt contrarian approach to care; avoid blood draws and save on blood - dc daily lab draws that are not essential such as tracking hgb   GLOBAL   Family updated at bedside  DENTITION  - poor; needs dental consult at some point  Dr. Kalman Shan, M.D., [facility].C.P Pulmonary and Critical Care Medicine Staff Physician [facility] [place] [facility] Pager: [phone], If no answer or between  15:00h - 7:00h: call [phone]  01/11/2012 10:29 PM

## 2012-01-11 NOTE — Progress Notes (Signed)
ID: Nichola Sizer   DOB: 1933-06-12  MR#: [id number]  CSN#:[id number]  HISTORY OF PRESENT ILLNESS: [name] is followed by Dr. Dallas Schimke for a history of continuing ETOH abuse. The patient had several blackout spells late May. In early June the patient presented to Dr. Dallas Schimke complaining of worsening SOB and Right-sided pleuritic chest pain. A CXR was obtained and was followed up by CT scans of the chest, abdomen and pelvis 12/30/2011 which showed a large loculated Right effusion and on the Left a 1.5 cm spiculated lingular mass. Right thoracentesis 01/04/2012 removed approximately 1/2 L of cloudy pestilent fluid, The overall picture is consistent with aspiration PNA in the setting of LOC complicated by empyema. The patient was started on Zosyn and set up for VATS drainage. 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  INTERVAL HISTORY: Dr. Edwyna Shell consulted hematology for further evaluation and treatment of the patient's anemia and discussion of management of the Left lingular mass.  REVIEW OF SYSTEMS: The patient currently denies pain. He was urgent to have a bowel movement and this limited conversation. He feels his breathing is better. Denies unusual headaches, nausea, vomiting, visual changes, or any fever, rash or bleeding. Wife is in the room.  PAST MEDICAL HISTORY: Past Medical History  Diagnosis Date  . Anemia     NOS  . Arthritis   . Gout   . Hyperlipidemia   . Hypertension   . GERD (gastroesophageal reflux disease)   . Elevated PSA     multiple times, refused urology eval [facility] notes)  . ETOH abuse   . Medically noncompliant     noncompliant with follow ups, labs.  . Alcoholism 06/25/2011    PAST SURGICAL HISTORY: S/p resection of multiple lipomas  FAMILY HISTORY The patient's father died before the age of 20 from complications of diabetes; the patient's mother died in  her 30's. Two sisters died in their late   41's or early 14's, one from breast cancer. The patient has 1 sister and three brothers surviving.  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 He is in the 22d year of his 3d marriage. His wife Bob Wilson used to do private duty work, but is retired. The patient attends the local [employer]   ADVANCED DIRECTIVES:  HEALTH MAINTENANCE: History  Substance Use Topics  . Smoking status: Former Smoker    Types: Cigarettes    Quit date: 07/20/1974  . Smokeless tobacco: Former Neurosurgeon   Comment: Remote- quit 45 years ago.  . Alcohol Use: 0.0 oz/week     daily     Colonoscopy:  PAP:  Bone density:  Lipid panel:  Allergies  Allergen Reactions  . Ace Inhibitors Other (See Comments)    REACTION: Angioedema  . Lisinopril Swelling and Other (See Comments)    REACTION: Facial Swelling    Current Facility-Administered Medications  Medication Dose Route Frequency Provider Last Rate Last Dose  . acetaminophen (TYLENOL) tablet 650 mg  650 mg Oral Q6H PRN Rowe Clack, PA       Or  . acetaminophen (TYLENOL) suppository 650 mg  650 mg Rectal Q6H PRN Rowe Clack, PA      . alum & mag hydroxide-simeth (MAALOX/MYLANTA) 200-200-20 MG/5ML suspension 30 mL  30 mL Oral Q6H PRN Deniece Portela E  Gold, PA   30 mL at 01/09/12 0500  . colchicine tablet 0.6 mg  0.6 mg Oral BID Ines Bloomer, MD      . darbepoetin [facility]) injection 25 mcg  25 mcg Subcutaneous Q7 days Thuy Dien Dang, [place]   25 mcg at 01/08/12 1659  . docusate sodium (COLACE) capsule 100 mg  100 mg Oral BID Rowe Clack, PA   100 mg at 01/09/12 2144  . folic acid (FOLVITE) tablet 1 mg  1 mg Oral Daily Erin R Barrett, PA   1 mg at 01/11/12 1127  . HYDROcodone-acetaminophen (NORCO) 5-325 MG per tablet 1 tablet  1 tablet Oral Q6H PRN Rowe Clack, PA   1 tablet at 01/10/12 2250  . iron polysaccharides (NIFEREX)  capsule 150 mg  150 mg Oral Daily Ines Bloomer, MD   150 mg at 01/11/12 1128  . LORazepam (ATIVAN) tablet 1 mg  1 mg Oral Q6H PRN Rae Roam Barrett, PA       Or  . LORazepam (ATIVAN) injection 1 mg  1 mg Intravenous Q6H PRN Erin R Barrett, PA      . multivitamin with minerals tablet 1 tablet  1 tablet Oral Daily Rowe Clack, PA   1 tablet at 01/11/12 1129  . ondansetron (ZOFRAN) tablet 4 mg  4 mg Oral Q6H PRN Rowe Clack, PA       Or  . ondansetron (ZOFRAN) injection 4 mg  4 mg Intravenous Q6H PRN Rowe Clack, PA      . oxyCODONE-acetaminophen (PERCOCET) 5-325 MG per tablet 2 tablet  2 tablet Oral Q6H PRN Ines Bloomer, MD      . piperacillin-tazobactam (ZOSYN) IVPB 3.375 g  3.375 g Intravenous Q8H Ines Bloomer, MD   3.375 g at 01/11/12 0818  . potassium chloride SA (K-DUR,KLOR-CON) CR tablet 20 mEq  20 mEq Oral BID Ines Bloomer, MD   20 mEq at 01/10/12 2103  . sodium chloride 0.9 % injection 3 mL  3 mL Intravenous Q12H Rowe Clack, PA   3 mL at 01/11/12 1129  . thiamine (VITAMIN B-1) tablet 100 mg  100 mg Oral Daily Erin R Barrett, PA   100 mg at 01/11/12 1128  . traMADol (ULTRAM) tablet 50 mg  50 mg Oral Q6H PRN Erin R Barrett, PA   50 mg at 01/09/12 1512  . DISCONTD: 0.9 % NaCl with KCl 40 mEq / L  infusion   Intravenous Continuous Ines Bloomer, MD   20 mL/hr at 01/09/12 1513  . DISCONTD: thiamine (B-1) injection 100 mg  100 mg Intravenous Daily Erin R Barrett, PA        OBJECTIVE: elderly African American male examined in bed Filed Vitals:   01/11/12 1356  BP: 129/74  Pulse: 89  Temp: 98.2 F (36.8 C)  Resp: 18     Body mass index is 22.84 kg/(m^2).    ECOG FS: 3  Sclerae unicteric Oropharynx clear No cervical, supraclavicular or axillary adenopathy Lungs auscultated anteriorily -- no rales or wheezes Heart regular rate and rhythm Abd benign MSK no peripheral edema Neuro: nonfocal, A&O x3   LAB RESULTS: Lab Results  Component Value Date   WBC 31.4*  01/11/2012   NEUTROABS 28.5* 01/07/2012   HGB 9.5* 01/11/2012   HCT 25.9* 01/11/2012   MCV 89.0 01/11/2012   PLT 715* 01/11/2012   BMET    Component Value Date/Time   NA 134* 01/11/2012 0505  K 5.3* 01/11/2012 0505   CL 101 01/11/2012 0505   CO2 24 01/11/2012 0505   GLUCOSE 76 01/11/2012 0505   BUN 20 01/11/2012 0505   CREATININE 1.17 01/11/2012 0505   CALCIUM 8.5 01/11/2012 0505   GFRNONAA 58* 01/11/2012 0505   GFRAA 68* 01/11/2012 0505     Lab 01/07/12 1246  INR 1.08   Hepatic Function Panel     Component Value Date/Time   PROT 6.4 01/07/2012 1246   ALBUMIN 1.5* 01/07/2012 1246   AST 13 01/07/2012 1246   ALT 8 01/07/2012 1246   ALKPHOS 208* 01/07/2012 1246   BILITOT 0.6 01/07/2012 1246   BILIDIR 0.3 12/28/2011 1133     Urinalysis    Component Value Date/Time   COLORURINE YELLOW 01/07/2012 1542   APPEARANCEUR CLEAR 01/07/2012 1542   LABSPEC 1.043* 01/07/2012 1542   PHURINE 5.5 01/07/2012 1542   GLUCOSEU NEGATIVE 01/07/2012 1542   HGBUR NEGATIVE 01/07/2012 1542   BILIRUBINUR NEGATIVE 01/07/2012 1542   KETONESUR NEGATIVE 01/07/2012 1542   PROTEINUR NEGATIVE 01/07/2012 1542   UROBILINOGEN 1.0 01/07/2012 1542   NITRITE NEGATIVE 01/07/2012 1542   LEUKOCYTESUR NEGATIVE 01/07/2012 1542    STUDIES: Ct Abdomen Pelvis W Wo Contrast  12/30/2011  *RADIOLOGY REPORT*  Clinical Data:  Elevated white blood cell count.  Evaluate for neoplasm.  Abdominal pain.  Enlarged liver.  CT CHEST, ABDOMEN AND PELVIS WITH CONTRAST  Technique:  Multidetector CT imaging of the abdomen was performed without contrast following a standard protocol.  Subsequently, a multidetector CT imaging of the chest, abdomen and pelvis  was performed following the standard protocol during bolus administration of intravenous contrast. Delayed images were also obtained through the liver and kidneys.  Contrast: OMNIPAQUE IOHEXOL 300 MG/ML  SOLN  Comparison:  No priors.  CT CHEST  Findings:  Mediastinum: Heart size is normal. Small  amount of pericardial fluid and / or thickening, unlikely to be of hemodynamic significance at this time.  No associated pericardial calcification.  There is a 1.6 x 0.9 cm low attenuation lesion in the isthmus of the thyroid gland which is highly nonspecific. No pathologically enlarged mediastinal or hilar lymph nodes. Esophagus is unremarkable in appearance.  Lungs/Pleura: Within the inferior segment of the lingula there is a 1.5 x 1.3 cm macrolobulated nodule which has some spiculated margins and mild retraction of the overlying pleura (image 37 of series 9). Areas of atelectasis and/or scarring are noted in the dependent portion of the left lower lobe.  In the right hemithorax there is a large partially loculated right-sided pleural effusion which demonstrates some pleural thickening and enhancement, concerning for either an empyema, a malignant pleural effusion or potentially a resolving old chronic hemothorax.  This is associated with passive atelectasis of the majority of the right lower lobe (with some sparing of the superior segment), and portions of the dependent aspect of the right middle and right upper lobes.  Musculoskeletal: There are no aggressive appearing lytic or blastic lesions noted in the visualized portions of the skeleton.  However, there are nondisplaced healing fractures of the lateral aspect of the right third, fourth and fifth ribs.  IMPRESSION:  1.  1.5 x 1.3 cm macrolobulated nodule with some spiculated margins and retraction of the overlying pleura in the inferior segment lingula is concerning for primary bronchogenic neoplasm.  This could be further evaluated with PET CT or CT-guided percutaneous needle biopsy if clinically indicated.  2.  Large right-sided partially loculated rim enhancing pleural fluid collection  concerning for potential empyema, malignant pleural effusion, or potentially a resolving hemothorax (given the presence of multiple right-sided rib fracture of this may be  a possibility).  Clinical correlation is recommended. 3.  Extensive passive atelectasis through the majority of the right lower lobe, in addition to portions of the right middle and upper lobes.  There is also some subsegmental atelectasis or scarring in the left lower lobe. 4. There are calcifications of the aortic valve.  Echocardiographic correlation for evaluation of potential valvular dysfunction may be warranted if clinically indicated.  CT ABDOMEN AND PELVIS  Findings:  Abdomen/Pelvis: The liver has a slightly shrunken appearance and the nodular contour, suggesting underlying cirrhosis. Within segment 2 of the liver there is a well defined ovoid shaped 2.9 x 2.1 cm lesion that is not apparent on precontrast imaging, virtually invisible on the arterial phase imaging, generally low attenuation with a suggestion of a tiny focus of peripheral nodular enhancement (images 52 and 53 of series 7) on the portal venous phase imaging, and similar to blood pool on delayed imaging.  This lesion is difficult to definitively characterize, but is favored to represent an atypical hemangioma.  There is a small amount of high attenuation material layering dependently within the lumen of the gallbladder compatible with biliary sludge.  Gallbladder does not appear distended at this time, nor is there pericholecystic fluid or stranding to suggest the presence of an acute cholecystitis.  The pancreas is atrophic, but otherwise unremarkable.  The enhanced appearance of the spleen and right adrenal gland is unremarkable. There are multifocal nodules within the left adrenal gland in the some of which are low attenuation, however, others cannot be definitively characterize (image 30 of series 3 demonstrates a 1.2 cm nodule measuring 14 HU).  There are well defined low attenuation lesions in the kidneys bilaterally which do not enhance, compatible with simple cysts.  The largest of these is in the lower pole of the left kidney measuring  2.5 cm in diameter.  There is a right inguinal hernia containing a short segment of small bowel.  However, there is no pathologic distension of bowel to suggest incarceration or obstruction at this time.  No ascites or pneumoperitoneum and no definite pathologic lymphadenopathy identified within the abdomen or pelvis.  The appendix is normal. There is atherosclerosis throughout the abdominal and pelvic vasculature, without definite aneurysm or dissection.  Prostate gland appears mildly enlarged.  Urinary bladder is largely decompressed, but otherwise unremarkable in appearance.  Musculoskeletal: There are no aggressive appearing lytic or blastic lesions noted in the visualized portions of the skeleton.  IMPRESSION:  1.  2.9 x 2.1 cm lesion in segment two of the liver has unusual imaging characteristics, but is favored to represent a cavernous hemangioma.  This could be definitively characterized with MRI if clinically indicated. 2.  Multiple left adrenal nodules, the majority of which likely represent small adenomas.  There is one nodule, however, which cannot be definitively characterized.  This too could be better characterized with MRI. 3.  Biliary sludge within the gallbladder, without findings to suggest acute cholecystitis at this time. 4. Small right inguinal hernia containing a short segment of small bowel.  No evidence of incarceration or bowel obstruction at this time. 5.  Multiple bilateral renal cysts, as above.  Original Report Authenticated By: Florencia Reasons, M.D.   Dg Chest 1 View  01/04/2012  *RADIOLOGY REPORT*  Clinical Data: Post right thoracentesis  CHEST - 1 VIEW  Comparison: Chest CT 12/30/2011  Findings: Decrease in right pleural effusion post thoracentesis. Multiple small air-fluid levels are identified at the right lung base consistent with loculated hydropneumothorax. No apical pneumothorax identified. Left lung clear. Costal deformities left second and third ribs anterolaterally  with fusion. No acute osseous findings.  IMPRESSION: Loculated hydropneumothorax at right base with decrease in right pleural effusion post thoracentesis.  Findings called Urbano Heir in Radiology Dept. at 1141 hours on 01/04/2012; she will relay the findings to Dr. Lowella Dandy.  Original Report Authenticated By: Lollie Marrow, M.D.   Dg Chest 2 View  01/07/2012  *RADIOLOGY REPORT*  Clinical Data: Preoperative evaluation for VATS.  Shortness of breath with dry cough right-sided lower and axillary chest pain. Right pleural effusion post drainage 2 days ago.  CHEST - 2 VIEW  Comparison: 01/04/2012  Findings: Heart mediastinal contours are stable.  There is a persistent large hydropneumothorax identified on the right with laterally positioned fluid suggesting some degree of loculation. The amount of fluid appears have increased since the prior recent post thoracentesis exam.  The right lung base is obscured by the large amount of fluid. Linear density in the right upper lung zone correlates with compressive atelectasis adjacent to the hydropneumothorax.  The remainder of the visualized lung fields appear clear.  No left-sided pleural fluid is seen.  Bony structures appear intact.  IMPRESSION: Right hydropneumothorax with loculated component suggested laterally.  Original Report Authenticated By: Bertha Stakes, M.D.   Ct Head W Wo Contrast  01/07/2012  *RADIOLOGY REPORT*  Clinical Data: 78 year old male with syncope.  Lung nodule suspicious for carcinoma.  CT HEAD WITHOUT AND WITH CONTRAST  Technique:  Contiguous axial images were obtained from the base of the skull through the vertex without and with intravenous contrast.  Contrast: 80mL OMNIPAQUE IOHEXOL 300 MG/ML  SOLN  Comparison: Neck CT 12/02/2006.  Findings: Minimal paranasal sinus mucosal thickening.  Mastoids are clear. No acute osseous abnormality identified.  Visualized orbits and scalp soft tissues are within normal limits.  Patchy cerebral  periventricular white matter hypodensity. No midline shift, mass effect, or evidence of mass lesion.  No abnormal enhancement identified.  No ventriculomegaly. No acute intracranial hemorrhage identified.  No evidence of cortically based acute infarction identified.  Dominant distal left vertebral artery.  Major intracranial vascular structures are enhancing.  IMPRESSION: No acute intracranial abnormality.  Mild to moderate for age nonspecific white matter changes.  Original Report Authenticated By: Harley Hallmark, M.D.   Ct Chest Wo Contrast  01/11/2012  *RADIOLOGY REPORT*  Clinical Data: Empyema in the right hemithorax.  Status post VATS on 01/07/2012.  CT CHEST WITHOUT CONTRAST  Technique:  Multidetector CT imaging of the chest was performed following the standard protocol without IV contrast.  Comparison: 12/30/2011.  Findings: Scattered small lymph nodes are seen and both axillary regions.  Small mediastinal lymph nodes are evident.  No left hilar lymphadenopathy.  Right lower lobe collapse / consolidation obscures the right hilum.  A large collection of heterogeneous fluid/debris and gas is seen in the posterior and inferior right hemithorax.  This collection measures approximately 19 x 11 x 11 cm.  Imaging features are consistent with the reported clinical history of empyema.  There is overlying collapse / consolidation of the posterior right middle and anterior right lower lobes.  Small lobular nodule in the lingula is stable.  IMPRESSION: Interval evolution of the right pleural fluid collection which is now 19 x 11 x 11 cm and contains heterogeneous material and gas. Imaging features  are compatible with empyema.  Stable appearance of the lobular nodule within the lingula. Primary bronchogenic neoplasm could have this appearance.  Original Report Authenticated By: ERIC A. MANSELL, M.D.   Ct Chest W Contrast  12/30/2011  *RADIOLOGY REPORT*  Clinical Data:  Elevated white blood cell count.  Evaluate for  neoplasm.  Abdominal pain.  Enlarged liver.  CT CHEST, ABDOMEN AND PELVIS WITH CONTRAST  Technique:  Multidetector CT imaging of the abdomen was performed without contrast following a standard protocol.  Subsequently, a multidetector CT imaging of the chest, abdomen and pelvis  was performed following the standard protocol during bolus administration of intravenous contrast. Delayed images were also obtained through the liver and kidneys.  Contrast: OMNIPAQUE IOHEXOL 300 MG/ML  SOLN  Comparison:  No priors.  CT CHEST  Findings:  Mediastinum: Heart size is normal. Small amount of pericardial fluid and / or thickening, unlikely to be of hemodynamic significance at this time.  No associated pericardial calcification.  There is a 1.6 x 0.9 cm low attenuation lesion in the isthmus of the thyroid gland which is highly nonspecific. No pathologically enlarged mediastinal or hilar lymph nodes. Esophagus is unremarkable in appearance.  Lungs/Pleura: Within the inferior segment of the lingula there is a 1.5 x 1.3 cm macrolobulated nodule which has some spiculated margins and mild retraction of the overlying pleura (image 37 of series 9). Areas of atelectasis and/or scarring are noted in the dependent portion of the left lower lobe.  In the right hemithorax there is a large partially loculated right-sided pleural effusion which demonstrates some pleural thickening and enhancement, concerning for either an empyema, a malignant pleural effusion or potentially a resolving old chronic hemothorax.  This is associated with passive atelectasis of the majority of the right lower lobe (with some sparing of the superior segment), and portions of the dependent aspect of the right middle and right upper lobes.  Musculoskeletal: There are no aggressive appearing lytic or blastic lesions noted in the visualized portions of the skeleton.  However, there are nondisplaced healing fractures of the lateral aspect of the right third, fourth  and fifth ribs.  IMPRESSION:  1.  1.5 x 1.3 cm macrolobulated nodule with some spiculated margins and retraction of the overlying pleura in the inferior segment lingula is concerning for primary bronchogenic neoplasm.  This could be further evaluated with PET CT or CT-guided percutaneous needle biopsy if clinically indicated.  2.  Large right-sided partially loculated rim enhancing pleural fluid collection concerning for potential empyema, malignant pleural effusion, or potentially a resolving hemothorax (given the presence of multiple right-sided rib fracture of this may be a possibility).  Clinical correlation is recommended. 3.  Extensive passive atelectasis through the majority of the right lower lobe, in addition to portions of the right middle and upper lobes.  There is also some subsegmental atelectasis or scarring in the left lower lobe. 4. There are calcifications of the aortic valve.  Echocardiographic correlation for evaluation of potential valvular dysfunction may be warranted if clinically indicated.  CT ABDOMEN AND PELVIS  Findings:  Abdomen/Pelvis: The liver has a slightly shrunken appearance and the nodular contour, suggesting underlying cirrhosis. Within segment 2 of the liver there is a well defined ovoid shaped 2.9 x 2.1 cm lesion that is not apparent on precontrast imaging, virtually invisible on the arterial phase imaging, generally low attenuation with a suggestion of a tiny focus of peripheral nodular enhancement (images 52 and 53 of series 7) on the portal venous  phase imaging, and similar to blood pool on delayed imaging.  This lesion is difficult to definitively characterize, but is favored to represent an atypical hemangioma.  There is a small amount of high attenuation material layering dependently within the lumen of the gallbladder compatible with biliary sludge.  Gallbladder does not appear distended at this time, nor is there pericholecystic fluid or stranding to suggest the presence  of an acute cholecystitis.  The pancreas is atrophic, but otherwise unremarkable.  The enhanced appearance of the spleen and right adrenal gland is unremarkable. There are multifocal nodules within the left adrenal gland in the some of which are low attenuation, however, others cannot be definitively characterize (image 30 of series 3 demonstrates a 1.2 cm nodule measuring 14 HU).  There are well defined low attenuation lesions in the kidneys bilaterally which do not enhance, compatible with simple cysts.  The largest of these is in the lower pole of the left kidney measuring 2.5 cm in diameter.  There is a right inguinal hernia containing a short segment of small bowel.  However, there is no pathologic distension of bowel to suggest incarceration or obstruction at this time.  No ascites or pneumoperitoneum and no definite pathologic lymphadenopathy identified within the abdomen or pelvis.  The appendix is normal. There is atherosclerosis throughout the abdominal and pelvic vasculature, without definite aneurysm or dissection.  Prostate gland appears mildly enlarged.  Urinary bladder is largely decompressed, but otherwise unremarkable in appearance.  Musculoskeletal: There are no aggressive appearing lytic or blastic lesions noted in the visualized portions of the skeleton.  IMPRESSION:  1.  2.9 x 2.1 cm lesion in segment two of the liver has unusual imaging characteristics, but is favored to represent a cavernous hemangioma.  This could be definitively characterized with MRI if clinically indicated. 2.  Multiple left adrenal nodules, the majority of which likely represent small adenomas.  There is one nodule, however, which cannot be definitively characterized.  This too could be better characterized with MRI. 3.  Biliary sludge within the gallbladder, without findings to suggest acute cholecystitis at this time. 4. Small right inguinal hernia containing a short segment of small bowel.  No evidence of incarceration  or bowel obstruction at this time. 5.  Multiple bilateral renal cysts, as above.  Original Report Authenticated By: Florencia Reasons, M.D.   Dg Chest Port 1 View  01/11/2012  *RADIOLOGY REPORT*  Clinical Data: Follow-up for empyema status post VATS.  PORTABLE CHEST - 1 VIEW  Comparison: Chest x-ray 01/10/2012.  Findings: Again noted is a large right-sided pleural effusion, with associated regions of atelectasis and/or consolidation in the portions of the right lung, particularly in the right middle and lower lobes.  Left lung is well-aerated.  No definite left-sided pleural effusion.  Pulmonary vasculature appears normal.  Heart size is normal.  IMPRESSION: 1.  Allowing for slight differences in patient positioning, the radiographic appearance of the chest is essentially unchanged, as detailed above.  Original Report Authenticated By: Florencia Reasons, M.D.   Dg Chest Port 1 View  01/10/2012  *RADIOLOGY REPORT*  Clinical Data: 78 year old male with empyema.  PORTABLE CHEST - 1 VIEW  Comparison: 01/09/2012 and prior chest radiographs  Findings: The cardiomediastinal silhouette is stable. A loculated right pleural effusion/empyema is unchanged as well as right mid and lower lung atelectasis. There is no evidence of pneumothorax. The left lung is clear.  IMPRESSION: Unchanged chest radiograph with right loculated effusion/empyema and associated atelectasis.  Original Report Authenticated  By: JEFFREY Brand Males, M.D.   Dg Chest Port 1 View  01/09/2012  *RADIOLOGY REPORT*  Clinical Data: Right chest tube removal  PORTABLE CHEST - 1 VIEW  Comparison: 01/08/2012  Findings: Right chest tube has been removed.  Moderately large right pleural effusion is unchanged with right lower lobe airspace disease.  No pneumothorax.  Small left effusion.  No heart failure.  IMPRESSION: No pneumothorax post right chest tube removal.  There remains a moderately large right pleural effusion.  Original Report Authenticated By: Camelia Phenes, M.D.   Dg Chest Port 1 View  01/09/2012  *RADIOLOGY REPORT*  Clinical Data: Empyema  PORTABLE CHEST - 1 VIEW  Comparison: Yesterday  Findings: Stable right chest tube with an old beyond the thorax. Stable right loculated hydropneumothorax.  Tiny right apical pneumothorax suspected.  Left lung is clear.  IMPRESSION: Tiny right apical pneumothorax suspected.  Otherwise stable.  Original Report Authenticated By: Donavan Burnet, M.D.   Dg Chest Port 1 View  01/08/2012  *RADIOLOGY REPORT*  Clinical Data: Right hydropneumothorax.  Chest pain. Chest tube insertion.  PORTABLE CHEST - 1 VIEW  Comparison: 01/07/2012  Findings: Right-sided chest tube has been inserted.  The side hole is in the soft tissues extrinsic to the thoracic cage.  There has been a slight decrease in the right pleural effusion.  Left lung is clear.  Heart size and vascularity are normal.  IMPRESSION: Slight decrease in the right pleural effusion.  Chest tube tip is barely in the thoracic cage.  Original Report Authenticated By: Gwynn Burly, M.D.   US Thoracentesis Asp Pleural Space W/img Guide  01/04/2012  *RADIOLOGY REPORT*  Clinical Data:  History of right rib fractures and suspected loculated right pleural effusion.  ULTRASOUND GUIDED right THORACENTESIS  Comparison:  Chest CT 12/30/2011  An ultrasound guided thoracentesis was thoroughly discussed with the patient and questions answered.  The benefits, risks, alternatives and complications were also discussed.  The patient understands and wishes to proceed with the procedure.  Written consent was obtained.  Ultrasound was performed to localize and mark an adequate pocket of fluid in the right chest.  The area was then prepped and draped in the normal sterile fashion.  1% Lidocaine was used for local anesthesia.  Under ultrasound guidance a 19 gauge Yueh catheter was introduced.  Thoracentesis was performed.  The catheter was removed and a dressing applied.  Complications:   None  Findings: Approximately 450 ml of cloudy, yellow and foul-smelling fluid was removed.  A fluid sample was sent for laboratory analysis.  In addition, a large amount of gas was aspirated during the procedure.  Ultrasound images prior to the thoracentesis demonstrated multiple echoes within the pleural fluid.  These findings are consistent with pleural air.  IMPRESSION: Successful ultrasound guided right thoracentesis yielding 450 ml of purulent fluid.  Findings are compatible with an empyema. These findings were discussed with Dr. Jetty Duhamel prior to discharge.  Original Report Authenticated By: Richarda Overlie, M.D.    ASSESSMENT: 78 y.o. [place] man with a history of significant ETOH abuse  (1) Right empyema likely secondary to aspiration following episodes of LOC late May 2013  (2) 1.5 cm spiculated lingular lesion, consistent with a primary lung malignancy, clinically T1 N0 or stage 1  (3) anemia in the setting of refusal of transfusion for religious reasons  (4) elevated PSA with elevated alkaline phosphatase  PLAN: the patient has a normocytic anemia with no clinical evidence of bleeding, inappropriately low  reticulocyte count, normal creatinine, and normal LDH and bilirubin. 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 The patient's WBC and platelets are elevated secondary to his infection. He has been started on aranesp, which may accelerate hemoglobin recovery, but in any case I would expect with another 2 weeks of abstinence the hemoglobin should normalize (was 12.28 April 2011). I do not believe any other interventions are indicated at this time.  Once the patient's Right-sided empyema resolves, he should proceed to resection of the lingular mass. If that shows carcinoma as expected the patient should be referred to my partner Dr. Arbutus Ped for discussion in the thoracic oncology conference and definitive treatment plan.  The patient has a  high PSA for which he has declined biopsy. He has an elevated alkaline phosphatase but review of the CT scans above show no sclerotic lesions. Given this, in the absence of symptoms this can be followed on an outpatient basis by [name] primary MD.  I discussed the above with the patient and his wife and gave them the information in writing. Please let me know if I can be of further help. Will follow peripherally from this point.   Chiron Campione C    01/11/2012

## 2012-01-11 NOTE — Progress Notes (Signed)
Cc: right empyema s/p VATS.  CT reveals large collection/empyema amendable to percutaneous drainage catheter placement. Dr. Edwyna Shell has requested placement if appropriate per V.O. To Dr. Katherina Right - see his note from earlier.   Ines Bloomer, MD Physician Signed Cardiothoracic Surgery Progress Notes 01/11/2012 10:25 AM  graphic                                             3 Days Post-Op Procedure(s) (LRB): VIDEO ASSISTED THORACOSCOPY (VATS)/EMPYEMA (Right) Subjective: The patient's hematocrit is down to 25.6. I have canceled his surgery because he will not take blood and there is a high probability that blood will be needed. He is afebrile blood white count was 31,000 today. I discussed this with Dr. Drue Second of infectious disease and she will consult on patient. I also called Dr. Darnelle Catalan with  Hematology to help with the low blood count and evaluate for any other causes of the elevated white blood cells area at this I then talked to Dr. Darrol Angel from pulmonology. He will take the patient in transfer to the pulmonology service. I ordered a repeat CT scan. I discussed with interventional radiology regarding pigtail drainage of the empyema and reculturing of empyema. I will continue to follow. We may be a rigid treat loculated effusion with  fibrinolytic therapy. Objective: Vital signs in last 24 hours: Temp:  [98 F (36.7 C)-98.5 F (36.9 C)] 98.5 F (36.9 C) (06/24 0500) Pulse Rate:  [89-95] 90  (06/24 0500) Cardiac Rhythm:  [-] Normal sinus rhythm (06/24 0850) Resp:  [18] 18  (06/24 0500) BP: (122-143)/(72-84) 143/84 mmHg (06/24 0500) SpO2:  [96 %-97 %] 96 % (06/24 0500) Weight:  [82.9 kg (182 lb 12.2 oz)] 82.9 kg (182 lb 12.2 oz) (06/24 0500)  Intake/Output from previous day: 06/23 0701 - 06/24 0700 In: 240 [P.O.:240] Out: 500 [Urine:500]  General appearance: alert Lungs: dullness to percussion RLL  Lab Results:  Basename  01/11/12 0505  01/09/12 0700   WBC  31.4*  28.2*   HGB   9.5*  11.2*   HCT  25.9*  32.9*   PLT  715*  531*    BMET:  Basename  01/11/12 0505   NA  134*   K  5.3*   CL  101   CO2  24   GLUCOSE  76   BUN  20   CREATININE  1.17   CALCIUM  8.5     PT/INR: No results found for this basename: LABPROT,INR in the last 72 hours ABG    Component  Value  Date/Time     PHART  7.501*  01/07/2012 1345     HCO3  25.1*  01/07/2012 1345     TCO2  26.1  01/07/2012 1345     O2SAT  93.0  01/07/2012 1345    CBG (last 3)   No results found for this basename: GLUCAP:3 in the last 72 hours  Assessment/Plan: S/P Procedure(s) (LRB): VIDEO ASSISTED THORACOSCOPY (VATS)/EMPYEMA (Right) Consult infectious disease and hematology. Transferred to the pulmonology service. Repeat CT scan. Possible pigtail drainage by interventional radiology  LOS: 4 days    BURNEY,DONALD PATRICK 01/11/2012   Talked with patient and family  in detail regarding drainage placement under CT guidance.  Risks of the procedure would be that of infection, bleeding, inadequate drainage and complications with moderate sedation.  Benefits would  be that of minimally invasive attempt to drain and treat empyema.    PE:  Heart : RRR without murmurs rubs or gallops        Lungs : diminished BS on right.   All labs appropriate to proceed and patient's questions answered to his satisfaction. Plan for drain placement under CT guidance 01/12/12.

## 2012-01-11 NOTE — Progress Notes (Signed)
                                              3 Days Post-Op Procedure(s) (LRB): VIDEO ASSISTED THORACOSCOPY (VATS)/EMPYEMA (Right) Subjective: The patient's hematocrit is down to 25.6. I have canceled his surgery because he will not take blood and there is a high probability that blood will be needed. He is afebrile blood white count was 31,000 today. I discussed this with Dr. Drue Second of infectious disease and she will consult on patient. I also called Dr. Darnelle Catalan with  Hematology to help with the low blood count and evaluate for any other causes of the elevated white blood cells area at this I then talked to Dr. Darrol Angel from pulmonology. He will take the patient in transfer to the pulmonology service. I ordered a repeat CT scan. I discussed with interventional radiology regarding pigtail drainage of the empyema and reculturing of empyema. I will continue to follow. We may be a rigid treat loculated effusion with  fibrinolytic therapy. Objective: Vital signs in last 24 hours: Temp:  [98 F (36.7 C)-98.5 F (36.9 C)] 98.5 F (36.9 C) (06/24 0500) Pulse Rate:  [89-95] 90  (06/24 0500) Cardiac Rhythm:  [-] Normal sinus rhythm (06/24 0850) Resp:  [18] 18  (06/24 0500) BP: (122-143)/(72-84) 143/84 mmHg (06/24 0500) SpO2:  [96 %-97 %] 96 % (06/24 0500) Weight:  [82.9 kg (182 lb 12.2 oz)] 82.9 kg (182 lb 12.2 oz) (06/24 0500)  Hemodynamic parameters for last 24 hours:    Intake/Output from previous day: 06/23 0701 - 06/24 0700 In: 240 [P.O.:240] Out: 500 [Urine:500] Intake/Output this shift:    General appearance: alert Lungs: dullness to percussion RLL  Lab Results:  Basename 01/11/12 0505 01/09/12 0700  WBC 31.4* 28.2*  HGB 9.5* 11.2*  HCT 25.9* 32.9*  PLT 715* 531*   BMET:  Basename 01/11/12 0505  NA 134*  K 5.3*  CL 101  CO2 24  GLUCOSE 76  BUN 20  CREATININE 1.17  CALCIUM 8.5    PT/INR: No results found for this basename: LABPROT,INR in the last 72 hours ABG      Component Value Date/Time   PHART 7.501* 01/07/2012 1345   HCO3 25.1* 01/07/2012 1345   TCO2 26.1 01/07/2012 1345   O2SAT 93.0 01/07/2012 1345   CBG (last 3)  No results found for this basename: GLUCAP:3 in the last 72 hours  Assessment/Plan: S/P Procedure(s) (LRB): VIDEO ASSISTED THORACOSCOPY (VATS)/EMPYEMA (Right) Consult infectious disease and hematology. Transferred to the pulmonology service. Repeat CT scan. Possible pigtail drainage by interventional radiology   LOS: 4 days    Annsleigh Dragoo [facility] 01/11/2012

## 2012-01-11 NOTE — Consult Note (Signed)
Infectious Diseases Initial Consultation  Reason for Consultation:  Antibiotics for empyema   HPI: Bob Wilson is a 78 y.o. male with HTN, GERD, alcohol dependence who is also a jehovah's witness who reports having several episodes of syncope, sustained rib fractures and scalp abrasions, as well as complained of chills with diaphoresis over the last 2-3 wks. He presented to his PCP on June 10th with one of his first episodes where at that time he had blood work that time showed a WBC of 24K, he underwent imaging which showed 1.5 x 1.3 cm macrolobulated nodule with some spiculated margins and retraction of the overlying pleura in the inferior segment lingula is concerning for primary bronchogenic neoplasm. As well as a large right-sided partially loculated rim enhancing pleural fluid collection concerning for potential empyema. As an outpatient he was referred to pulmonologist, Dr. Maple Hudson as well as CT surgeon, Dr. Edwyna Shell. He saw Dr. Maple Hudson on 6/17, where a thoracentesis removed 450 ml of cloudy, yellow, foul-smelling fluid. findings consistent with an empyema. Thus,the patient was admitted on 6/20 with the intent of undergoing VATS decortication for empyema and sending fluid for culture as well as cytology given concern for pulmonary neoplasm to be performed by Dr. Edwyna Shell. His WBC at this time, has now risen to 33.9K with 78N and 6% Bands, but Hgb/HCt dropped from 13.3/40 to 10.2/27. Given the patient's religious beliefs regarding blood products, the surgery was postponed given the high likelihood for blood transfusions. A chest tube was placed on 6/20 but not significant amount of output given repeat serial chest xrays. Patient was placed on vancomycin and piptazo since admit on 6/20 for broad coverage, but his CBC has not considerably decreased. Today, WBC is at 31.4 with impressive thrombocytosis of 715 up from 537 on admit, and thought to have a baseline of 300s. Patient only had fever x 1 documented of  100.8 on 6/21. Thus far afebrile. His chest tube has been removed with the intent for patient to have IR guided pigtail catheter and repeat CT.  Past Medical History  Diagnosis Date  . Anemia     NOS  . Arthritis   . Gout   . Hyperlipidemia   . Hypertension   . GERD (gastroesophageal reflux disease)   . Elevated PSA     multiple times, refused urology eval [facility] notes)  . ETOH abuse   . Medically noncompliant     noncompliant with follow ups, labs.  . Alcoholism 06/25/2011    Allergies:  Allergies  Allergen Reactions  . Ace Inhibitors Other (See Comments)    REACTION: Angioedema  . Lisinopril Swelling and Other (See Comments)    REACTION: Facial Swelling    Current antibiotics: vanco #4 piptazo #4  MEDICATIONS:    . colchicine  0.6 mg Oral BID  . darbepoetin  25 mcg Subcutaneous Q7 days  . docusate sodium  100 mg Oral BID  . folic acid  1 mg Oral Daily  . iron polysaccharides  150 mg Oral Daily  . multivitamin with minerals  1 tablet Oral Daily  . piperacillin-tazobactam (ZOSYN)  IV  3.375 g Intravenous Q8H  . potassium chloride  20 mEq Oral BID  . sodium chloride  3 mL Intravenous Q12H  . thiamine  100 mg Oral Daily  . DISCONTD: thiamine  100 mg Intravenous Daily    History  Substance Use Topics  . Smoking status: Former Smoker    Types: Cigarettes    Quit date: 07/20/1974  .  Smokeless tobacco: Former Neurosurgeon   Comment: Remote- quit 45 years ago.  . Alcohol Use: 0.0 oz/week     daily    History reviewed. No pertinent family history.   Review of Systems  Constitutional: Negative for fever, positive for chills, and diaphoresis, but normal activity change, appetite change, fatigue and unexpected weight change.  HENT: Negative for congestion, sore throat, rhinorrhea, sneezing, trouble swallowing and sinus pressure.  Eyes: Negative for photophobia and visual disturbance.  Respiratory: difficulty with pain on deep inspiration. Positive for dry cough and chest  tightness,but negative shortness of breath, wheezing and stridor.  Cardiovascular: Negative for chest pain, palpitations and leg swelling.  Gastrointestinal: Negative for nausea, vomiting, abdominal pain, diarrhea, constipation, blood in stool, abdominal distention and anal bleeding.  Genitourinary: Negative for dysuria, hematuria, flank pain and difficulty urinating.  Musculoskeletal: Negative for myalgias, back pain, joint swelling, arthralgias and gait problem.  Skin: Negative for color change, pallor, rash and wound.  Neurological: Negative for dizziness, tremors, weakness and light-headedness.  Hematological: Negative for adenopathy. Does not bruise/bleed easily.  Psychiatric/Behavioral: Negative for behavioral problems, confusion, sleep disturbance, dysphoric mood, decreased concentration and agitation.    OBJECTIVE: Temp:  [98 F (36.7 C)-98.5 F (36.9 C)] 98.2 F (36.8 C) (06/24 1356) Pulse Rate:  [89-95] 89  (06/24 1356) Resp:  [18] 18  (06/24 1356) BP: (122-143)/(72-84) 129/74 mmHg (06/24 1356) SpO2:  [96 %-97 %] 97 % (06/24 1356) Weight:  [182 lb 12.2 oz (82.9 kg)] 182 lb 12.2 oz (82.9 kg) (06/24 0500)  Physical Exam  Constitutional: He is oriented to person, place, and time. Elderly male. No distress.  HENT:  Mouth/Throat: Oropharynx is clear and moist. No oropharyngeal exudate. Poor dentition Cardiovascular: Normal rate, regular rhythm and normal heart sounds. Exam reveals no gallop and no friction rub.  No murmur heard.  Pulmonary/Chest: Effort normal and breath sounds normal on the left. Decrease BS on entire right hemithorax except apex. Decrease to percussion 2/3rd up lung field Abdominal: Soft. Bowel sounds are normal. He exhibits no distension. There is no tenderness.  Lymphadenopathy:  He has no cervical adenopathy.  Neurological: He is alert and oriented to person, place, and time.  Skin: Skin is warm and dry. No rash noted. No erythema.  Psychiatric: He has a  normal mood and affect. His behavior is normal.   LABS: Results for orders placed during the hospital encounter of 01/07/12 (from the past 48 hour(s))  CBC     Status: Abnormal   Collection Time   01/11/12  5:05 AM      Component Value Range Comment   WBC 31.4 (*) 4.0 - 10.5 K/uL    RBC 2.91 (*) 4.22 - 5.81 MIL/uL    Hemoglobin 9.5 (*) 13.0 - 17.0 g/dL    HCT 16.1 (*) 09.6 - 52.0 %    MCV 89.0  78.0 - 100.0 fL    MCH 32.6  26.0 - 34.0 pg    MCHC 36.7 (*) 30.0 - 36.0 g/dL    RDW 04.5 (*) 40.9 - 15.5 %    Platelets 715 (*) 150 - 400 K/uL   BASIC METABOLIC PANEL     Status: Abnormal   Collection Time   01/11/12  5:05 AM      Component Value Range Comment   Sodium 134 (*) 135 - 145 mEq/L    Potassium 5.3 (*) 3.5 - 5.1 mEq/L    Chloride 101  96 - 112 mEq/L    CO2 24  19 - 32 mEq/L    Glucose, Bld 76  70 - 99 mg/dL    BUN 20  6 - 23 mg/dL    Creatinine, Ser 1.61  0.50 - 1.35 mg/dL    Calcium 8.5  8.4 - 09.6 mg/dL    GFR calc non Af Amer 58 (*) >90 mL/min    GFR calc Af Amer 68 (*) >90 mL/min   LACTATE DEHYDROGENASE     Status: Normal   Collection Time   01/11/12 10:16 AM      Component Value Range Comment   LDH 199  94 - 250 U/L HEMOLYSIS AT THIS LEVEL MAY AFFECT RESULT    MICRO: 6/17 pleural fluid: microaerophilic strep  IMAGING: Ct Chest Wo Contrast  01/11/2012  *RADIOLOGY REPORT*  Clinical Data: Empyema in the right hemithorax.  Status post VATS on 01/07/2012.  CT CHEST WITHOUT CONTRAST  Technique:  Multidetector CT imaging of the chest was performed following the standard protocol without IV contrast.  Comparison: 12/30/2011.  Findings: Scattered small lymph nodes are seen and both axillary regions.  Small mediastinal lymph nodes are evident.  No left hilar lymphadenopathy.  Right lower lobe collapse / consolidation obscures the right hilum.  A large collection of heterogeneous fluid/debris and gas is seen in the posterior and inferior right hemithorax.  This collection measures  approximately 19 x 11 x 11 cm.  Imaging features are consistent with the reported clinical history of empyema.  There is overlying collapse / consolidation of the posterior right middle and anterior right lower lobes.  Small lobular nodule in the lingula is stable.  IMPRESSION: Interval evolution of the right pleural fluid collection which is now 19 x 11 x 11 cm and contains heterogeneous material and gas. Imaging features are compatible with empyema.  Stable appearance of the lobular nodule within the lingula. Primary bronchogenic neoplasm could have this appearance.  Original Report Authenticated By: ERIC A. MANSELL, M.D.   Dg Chest Port 1 View  01/11/2012  *RADIOLOGY REPORT*  Clinical Data: Follow-up for empyema status post VATS.  PORTABLE CHEST - 1 VIEW  Comparison: Chest x-ray 01/10/2012.  Findings: Again noted is a large right-sided pleural effusion, with associated regions of atelectasis and/or consolidation in the portions of the right lung, particularly in the right middle and lower lobes.  Left lung is well-aerated.  No definite left-sided pleural effusion.  Pulmonary vasculature appears normal.  Heart size is normal.  IMPRESSION: 1.  Allowing for slight differences in patient positioning, the radiographic appearance of the chest is essentially unchanged, as detailed above.  Original Report Authenticated By: Florencia Reasons, M.D.   Dg Chest Port 1 View  01/10/2012  *RADIOLOGY REPORT*  Clinical Data: 78 year old male with empyema.  PORTABLE CHEST - 1 VIEW  Comparison: 01/09/2012 and prior chest radiographs  Findings: The cardiomediastinal silhouette is stable. A loculated right pleural effusion/empyema is unchanged as well as right mid and lower lung atelectasis. There is no evidence of pneumothorax. The left lung is clear.  IMPRESSION: Unchanged chest radiograph with right loculated effusion/empyema and associated atelectasis.  Original Report Authenticated By: Rosendo Gros, M.D.   Dg Chest  Port 1 View  01/09/2012  *RADIOLOGY REPORT*  Clinical Data: Right chest tube removal  PORTABLE CHEST - 1 VIEW  Comparison: 01/08/2012  Findings: Right chest tube has been removed.  Moderately large right pleural effusion is unchanged with right lower lobe airspace disease.  No pneumothorax.  Small left effusion.  No heart failure.  IMPRESSION: No pneumothorax post right chest tube removal.  There remains a moderately large right pleural effusion.  Original Report Authenticated By: Camelia Phenes, M.D.    HISTORICAL MICRO/IMAGING 12/30/2011  CT CHEST  IMPRESSION:  1. 1.5 x 1.3 cm macrolobulated nodule with some spiculated margins  and retraction of the overlying pleura in the inferior segment  lingula is concerning for primary bronchogenic neoplasm. This  could be further evaluated with PET CT or CT-guided percutaneous  needle biopsy if clinically indicated.  2. Large right-sided partially loculated rim enhancing pleural  fluid collection concerning for potential empyema, malignant  pleural effusion, or potentially a resolving hemothorax (given the  presence of multiple right-sided rib fracture of this may be a  possibility). Clinical correlation is recommended.  3. Extensive passive atelectasis through the majority of the right  lower lobe, in addition to portions of the right middle and upper  lobes. There is also some subsegmental atelectasis or scarring in  the left lower lobe.  4. There are calcifications of the aortic valve. Echocardiographic  correlation for evaluation of potential valvular dysfunction may be  warranted if clinically indicated.  Assessment/Plan:  78yo Male with GERD, poor dentition who sustained numerous syncopal episodes in the past weeks, found to have right sided empyema including microaerophilic strep unable to undergo VATS-decortication safely due to anemia & religious beliefs, currently on vanco and piptazo.  - recommend to resend pleural fluid for culture,  please send for aerobic,afb, and fungal culture - leukocytosis and thrombocytosis likely related to undrained empyema, but can not be for sure if it excludes other heme disorders. - does not appear to have MRSA isolated on culture, please discontinue vancomycin. And keep on piptazo. - repeat check CBC in am to see that it is trending down since drainage - length of therapy difficult to dictate at this time since it will depend of drainage of his empyema. - can keep on piptazo for now, and will determine a simpler regimen for discharge.   Duke Salvia Drue Second MD MPH [facility] [phone]

## 2012-01-11 NOTE — Progress Notes (Signed)
eLink Physician-Brief Progress Note Patient Name: Bob Wilson DOB: 08-04-1932 MRN: [id number]  Date of Service  01/11/2012   HPI/Events of Note   Rising K on scheduled potassium supplements  eICU Interventions  D/c KCl and check BMP in the am   Intervention Category Minor Interventions: Electrolytes abnormality - evaluation and management  Shermaine Brigham S. 01/11/2012, 10:05 PM

## 2012-01-12 ENCOUNTER — Encounter (HOSPITAL_COMMUNITY): Payer: Self-pay | Admitting: Radiology

## 2012-01-12 ENCOUNTER — Inpatient Hospital Stay (HOSPITAL_COMMUNITY): Payer: Medicare Other

## 2012-01-12 LAB — PATHOLOGIST SMEAR REVIEW

## 2012-01-12 LAB — AFP TUMOR MARKER: AFP-Tumor Marker: <1.3 ng/mL (ref 0.0–8.0)

## 2012-01-12 LAB — BASIC METABOLIC PANEL
Calcium: 8.2 mg/dL — ABNORMAL LOW (ref 8.4–10.5)
Creatinine, Ser: 1.11 mg/dL (ref 0.50–1.35)
GFR calc Af Amer: 72 mL/min — ABNORMAL LOW (ref >90)
GFR calc non Af Amer: 62 mL/min — ABNORMAL LOW (ref >90)
Sodium: 135 mEq/L (ref 135–145)

## 2012-01-12 MED ORDER — FENTANYL CITRATE 0.05 MG/ML IJ SOLN
INTRAMUSCULAR | Status: AC
  Filled 2012-01-12: qty 4

## 2012-01-12 MED ORDER — FENTANYL CITRATE 0.05 MG/ML IJ SOLN
50.0000 ug | INTRAMUSCULAR | Status: DC | PRN
  Administered 2012-01-12 – 2012-01-14 (×5): 50 ug via INTRAVENOUS
  Filled 2012-01-12 (×5): qty 2

## 2012-01-12 MED ORDER — MIDAZOLAM HCL 2 MG/2ML IJ SOLN
INTRAMUSCULAR | Status: AC
  Filled 2012-01-12: qty 6

## 2012-01-12 MED ORDER — FUROSEMIDE 10 MG/ML IJ SOLN
20.0000 mg | Freq: Once | INTRAMUSCULAR | Status: AC
  Administered 2012-01-12: 20 mg via INTRAVENOUS
  Filled 2012-01-12: qty 2

## 2012-01-12 MED ORDER — FENTANYL CITRATE 0.05 MG/ML IJ SOLN
INTRAMUSCULAR | Status: AC | PRN
  Administered 2012-01-12: 50 ug via INTRAVENOUS

## 2012-01-12 MED ORDER — MIDAZOLAM HCL 5 MG/5ML IJ SOLN
INTRAMUSCULAR | Status: AC | PRN
  Administered 2012-01-12: 2 mg via INTRAVENOUS

## 2012-01-12 MED ORDER — TRAMADOL HCL 50 MG PO TABS: 100.0000 mg | ORAL_TABLET | Freq: Four times a day (QID) | ORAL | Status: DC | PRN

## 2012-01-12 NOTE — Care Management Note (Signed)
    Page 1 of 2   01/18/2012     12:00:11 PM   CARE MANAGEMENT NOTE 01/18/2012  Patient:  Bob Wilson, Bob Wilson   Account Number:  [id number]  Date Initiated:  01/08/2012  Documentation initiated by:  [facility]  Subjective/Objective Assessment:   low hct prior to surgery - refused blood -  Jehovah's witness.     Action/Plan:   Anticipated DC Date:  01/19/2012   Anticipated DC Plan:  HOME W HOME HEALTH SERVICES      DC Planning Services  CM consult      Choice offered to / List presented to:  C-1 Patient        HH arranged  HH-1 RN  IV Antibiotics  HH-2 PT  HH-3 OT      HH agency  [facility]   Status of service:  In process, will continue to follow Medicare Important Message given?   (If response is "NO", the following Medicare IM given date fields will be blank) Date Medicare IM given:   Date Additional Medicare IM given:    Discharge Disposition:    Per UR Regulation:  Reviewed for med. necessity/level of care/duration of stay  If discussed at Long Length of Stay Meetings, dates discussed:    Comments:  01-18-12 11:55am Avie Arenas RNBSN [phone] Thursday -  plan for SNF - Friday inpt rehab consulted for possibly going there, today - home with therapies, CT and IV antibiotics.  Wife and husband both agree they feel they can do this with HH.  Able to get up today with wifes assistance.  Gouty knee better.  Wife agreeable to learn care for CT and IV antibiotics. Plan for PICC today.  HH referral made - Planning for discharge tomorrow.  Have all kinds of DME at home - would like hospital bed and lift chair. Refferal made to [facility] for assistance in insurance coverage for hospital bed.  Referred to DME suppliers outside hospital for lift chairs.  01-12-12 8:30am Avie Arenas, RNBSN [phone] CT placed and removed - plan for pleurix insertion by IR. MRSA neg - continues on IV antibiotics.  Returned from [facility] with regular CT - not pleurix.  patient has not been OOB per  wife. Wife - Bob Wilson lives with patient, have three children but not available.  Will need HHRN on discharge if has CT/Pleurix.  May need PT - will need HH order and PT consult for assistance with discharge planning.  Wife not apposed to SNF for ST rehab if needed.  Will continue to follow.

## 2012-01-12 NOTE — Procedures (Signed)
R 16 Fr Chest tube Pus

## 2012-01-12 NOTE — Progress Notes (Signed)
INFECTIOUS DISEASE PROGRESS NOTE  ID: Bob Wilson is a 78 y.o. male with  Empyema s/p pig tail catheter placement on 6/25  Subjective: Patient feels somewhat better, not in any significant pain. Already has had purulent drainage collected in chest tube collection unit.  Abtx: piptazo   Medications:     . colchicine  0.6 mg Oral BID  . darbepoetin  25 mcg Subcutaneous Q7 days  . docusate sodium  100 mg Oral BID  . fentaNYL      . folic acid  1 mg Oral Daily  . furosemide  20 mg Intravenous Once  . iron polysaccharides  150 mg Oral Daily  . midazolam      . multivitamin with minerals  1 tablet Oral Daily  . piperacillin-tazobactam (ZOSYN)  IV  3.375 g Intravenous Q8H  . sodium chloride  3 mL Intravenous Q12H  . thiamine  100 mg Oral Daily  . DISCONTD: potassium chloride  20 mEq Oral BID    Objective: Vital signs in last 24 hours: Temp:  [96.7 F (35.9 C)-98.7 F (37.1 C)] 96.7 F (35.9 C) (06/25 1405) Pulse Rate:  [82-99] 96  (06/25 1405) Resp:  [12-22] 16  (06/25 1405) BP: (111-142)/(61-91) 142/82 mmHg (06/25 1405) SpO2:  [91 %-100 %] 91 % (06/25 1405) Weight:  [177 lb 11.1 oz (80.6 kg)] 177 lb 11.1 oz (80.6 kg) (06/25 0408) Physical Exam  Constitutional: He is oriented to person, place, and time. He appears well-developed and well-nourished. No distress.  HENT:  Mouth/Throat: Oropharynx is clear and moist. No oropharyngeal exudate.  Cardiovascular: Normal rate, regular rhythm and normal heart sounds. Exam reveals no gallop and no friction rub.  No murmur heard.  Pulmonary/Chest: Effort normal and breath sounds normal. No respiratory distress. He has no wheezes. Right side has very little airmovement, decreased to percussion 2/3rd way up Abdominal: Soft. Bowel sounds are normal. He exhibits no distension. There is no tenderness.  Lymphadenopathy:  He has no cervical adenopathy.  Neurological: He is alert and oriented to person, place, and time.  Skin: Skin  is warm and dry. No rash noted. No erythema.  Psychiatric: He has a normal mood and affect. His behavior is normal.    Lab Results  Basename 01/12/12 0519 01/11/12 0505  WBC -- 31.4*  HGB -- 9.5*  HCT -- 25.9*  NA 135 134*  K 4.2 5.3*  CL 101 101  CO2 22 24  BUN 21 20  CREATININE 1.11 1.17  GLU -- --    Microbiology: Recent Results (from the past 240 hour(s))  BODY FLUID CULTURE     Status: Normal   Collection Time   01/04/12 12:03 PM      Component Value Range Status Comment   Specimen Description FLUID RT CHEST   Final    Special Requests NONE   Final    Gram Stain     Final    Value: NO WBC SEEN     NO ORGANISMS SEEN   Culture     Final    Value: FEW MICROAEROPHILIC STREPTOCOCCI     Note: Standardized susceptibility testing for this organism is not available.   Report Status 01/08/2012 FINAL   Final   AFB CULTURE WITH SMEAR     Status: Normal (Preliminary result)   Collection Time   01/04/12 12:03 PM      Component Value Range Status Comment   Specimen Description FLUID CHEST   Final    Special Requests  NONE   Final    ACID FAST SMEAR NO ACID FAST BACILLI SEEN   Final    Culture     Final    Value: CULTURE WILL BE EXAMINED FOR 6 WEEKS BEFORE ISSUING A FINAL REPORT   Report Status PENDING   Incomplete   FUNGUS CULTURE W SMEAR     Status: Normal (Preliminary result)   Collection Time   01/04/12 12:03 PM      Component Value Range Status Comment   Specimen Description FLUID CHEST RIGHT   Final    Special Requests NONE   Final    Fungal Smear NO YEAST OR FUNGAL ELEMENTS SEEN   Final    Culture CULTURE IN PROGRESS FOR FOUR WEEKS   Final    Report Status PENDING   Incomplete   MRSA PCR SCREENING     Status: Normal   Collection Time   01/07/12 10:54 PM      Component Value Range Status Comment   MRSA by PCR NEGATIVE  NEGATIVE Final     Studies/Results: Ct Chest Wo Contrast  01/11/2012  *RADIOLOGY REPORT*  Clinical Data: Empyema in the right hemithorax.  Status  post VATS on 01/07/2012.  CT CHEST WITHOUT CONTRAST  Technique:  Multidetector CT imaging of the chest was performed following the standard protocol without IV contrast.  Comparison: 12/30/2011.  Findings: Scattered small lymph nodes are seen and both axillary regions.  Small mediastinal lymph nodes are evident.  No left hilar lymphadenopathy.  Right lower lobe collapse / consolidation obscures the right hilum.  A large collection of heterogeneous fluid/debris and gas is seen in the posterior and inferior right hemithorax.  This collection measures approximately 19 x 11 x 11 cm.  Imaging features are consistent with the reported clinical history of empyema.  There is overlying collapse / consolidation of the posterior right middle and anterior right lower lobes.  Small lobular nodule in the lingula is stable.  IMPRESSION: Interval evolution of the right pleural fluid collection which is now 19 x 11 x 11 cm and contains heterogeneous material and gas. Imaging features are compatible with empyema.  Stable appearance of the lobular nodule within the lingula. Primary bronchogenic neoplasm could have this appearance.  Original Report Authenticated By: ERIC A. MANSELL, M.D.   Ct Guided Abscess Drain  01/12/2012  *RADIOLOGY REPORT*  Clinical Data/Indication: RIGHT EMPYEMA  CT GUIDED ABCESS DRAINAGE WITH CATHETER  Sedation: Versed two mg, Fentanyl 50 mg.  Total Moderate Sedation Time: 15 minutes.  Procedure: The procedure, risks, benefits, and alternatives were explained to the patient. Questions regarding the procedure were encouraged and answered. The patient understands and consents to the procedure.  The right flank was prepped with betadine in a sterile fashion, and a sterile drape was applied covering the operative field. A sterile gown and sterile gloves were used for the procedure.  Under CT guidance, an 18 gauge needle was inserted into the right empyema and removed over an Amplatz.  A 16-French Thal-Quik drain  was inserted.  Frank pus was aspirated and sent for culture.  It was sewn in place and attached to 20 cm water suction.  Findings: Imaging demonstrates placement of a 16-French right chest tube.  Complications: None.  IMPRESSION: Successful 16-French right thoracostomy for empyema.  Original Report Authenticated By: Donavan Burnet, M.D.   Dg Chest Port 1 View  01/11/2012  *RADIOLOGY REPORT*  Clinical Data: Follow-up for empyema status post VATS.  PORTABLE CHEST - 1 VIEW  Comparison: Chest x-ray 01/10/2012.  Findings: Again noted is a large right-sided pleural effusion, with associated regions of atelectasis and/or consolidation in the portions of the right lung, particularly in the right middle and lower lobes.  Left lung is well-aerated.  No definite left-sided pleural effusion.  Pulmonary vasculature appears normal.  Heart size is normal.  IMPRESSION: 1.  Allowing for slight differences in patient positioning, the radiographic appearance of the chest is essentially unchanged, as detailed above.  Original Report Authenticated By: Florencia Reasons, M.D.     Assessment/Plan: Empyema = currently on piptazo. Await for repeat culture results to come back in next 2-3 days to see if other bacteria is isolated other than microaerophilic strep. Currently being treated as polymicrobial.  Leukocytosis = thought to be due to empyema, would repeat cbc with diff to see that leukocytosis is improving since starting to drain his empyema.  Zorah Backes Infectious Diseases 01/12/2012, 3:54 PM

## 2012-01-12 NOTE — Progress Notes (Signed)
UR Completed.  Fifi Schindler Jane [phone] 01/12/2012  

## 2012-01-12 NOTE — ED Notes (Signed)
[facility]porters taking pt back to his room.

## 2012-01-12 NOTE — Progress Notes (Signed)
PULMONARY/CCM CONSULT NOTE  Requesting MD/Service: Burney/TCTS Date of admission: 6/20  Date of consult: 6/20 Reason for consultation: peri-op med mgmt  Pt Profile:  79 yobm initially seen in consultation on 6/14 by Dr Maple Hudson for eval of L lung nodule and large R effusion. An US guided thoracentesis was performed by IR on 6/17 and 450 cc of foul smelling cloudy fluid consistent with empyema was removed > admitted 6/20 by TCTS service in preparation for VATS drainage of R pleural space planned for 6/21   EVENTS 6/24 Patient now on PCCM service. IR plans pig tail on 6/25. Due to drop in hgb Dr Edwyna Shell deferred VATS 6/25: s/p pigtail IR and 1200cc pus drained out   SUBJECTIVE/OVERNIGHT/INTERVAL HX Had ches tube drained 1200cc pus. Having some pain and getting fentanyl  Filed Vitals:   01/12/12 0940 01/12/12 0945 01/12/12 0951 01/12/12 1405  BP: 130/66 126/66 127/91 142/82  Pulse: 93 92 99 96  Temp:    96.7 F (35.9 C)  TempSrc:    Oral  Resp: 17 14 12 16   Height:      Weight:      SpO2: 99% 100% 96% 91%    EXAM:  Gen: WDWN in NAD HEENT: very poor dentition Neck:  No JVD, no LAN Chest Rt chest tube Lungs: dull to percussion with bronchial BS approx 1/2 up on R. Clear on L Cardiovascular: RRR s M Abdomen: NABS, soft, NT Ext: 1-2 symmetric edema Neuro: no focal deficits . Sleeping due to fentanyl for pain  DATA:  Ct Chest Wo Contrast  01/11/2012  *RADIOLOGY REPORT*  Clinical Data: Empyema in the right hemithorax.  Status post VATS on 01/07/2012.  CT CHEST WITHOUT CONTRAST  Technique:  Multidetector CT imaging of the chest was performed following the standard protocol without IV contrast.  Comparison: 12/30/2011.  Findings: Scattered small lymph nodes are seen and both axillary regions.  Small mediastinal lymph nodes are evident.  No left hilar lymphadenopathy.  Right lower lobe collapse / consolidation obscures the right hilum.  A large collection of heterogeneous fluid/debris and  gas is seen in the posterior and inferior right hemithorax.  This collection measures approximately 19 x 11 x 11 cm.  Imaging features are consistent with the reported clinical history of empyema.  There is overlying collapse / consolidation of the posterior right middle and anterior right lower lobes.  Small lobular nodule in the lingula is stable.  IMPRESSION: Interval evolution of the right pleural fluid collection which is now 19 x 11 x 11 cm and contains heterogeneous material and gas. Imaging features are compatible with empyema.  Stable appearance of the lobular nodule within the lingula. Primary bronchogenic neoplasm could have this appearance.  Original Report Authenticated By: ERIC A. MANSELL, M.D.   Ct Guided Abscess Drain  01/12/2012  *RADIOLOGY REPORT*  Clinical Data/Indication: RIGHT EMPYEMA  CT GUIDED ABCESS DRAINAGE WITH CATHETER  Sedation: Versed two mg, Fentanyl 50 mg.  Total Moderate Sedation Time: 15 minutes.  Procedure: The procedure, risks, benefits, and alternatives were explained to the patient. Questions regarding the procedure were encouraged and answered. The patient understands and consents to the procedure.  The right flank was prepped with betadine in a sterile fashion, and a sterile drape was applied covering the operative field. A sterile gown and sterile gloves were used for the procedure.  Under CT guidance, an 18 gauge needle was inserted into the right empyema and removed over an Amplatz.  A 16-French Thal-Quik drain was inserted.  Frank pus was aspirated and sent for culture.  It was sewn in place and attached to 20 cm water suction.  Findings: Imaging demonstrates placement of a 16-French right chest tube.  Complications: None.  IMPRESSION: Successful 16-French right thoracostomy for empyema.  Original Report Authenticated By: Donavan Burnet, M.D.   Dg Chest Port 1 View  01/11/2012  *RADIOLOGY REPORT*  Clinical Data: Follow-up for empyema status post VATS.  PORTABLE CHEST  - 1 VIEW  Comparison: Chest x-ray 01/10/2012.  Findings: Again noted is a large right-sided pleural effusion, with associated regions of atelectasis and/or consolidation in the portions of the right lung, particularly in the right middle and lower lobes.  Left lung is well-aerated.  No definite left-sided pleural effusion.  Pulmonary vasculature appears normal.  Heart size is normal.  IMPRESSION: 1.  Allowing for slight differences in patient positioning, the radiographic appearance of the chest is essentially unchanged, as detailed above.  Original Report Authenticated By: Florencia Reasons, M.D.      Lab 01/12/12 0519 01/11/12 0505 01/07/12 1246  NA 135 134* 128*  K 4.2 5.3* 3.0*  CL 101 101 91*  CO2 22 24 24   BUN 21 20 24*  CREATININE 1.11 1.17 1.04  GLUCOSE 78 76 90    Lab 01/11/12 0505 01/09/12 0700 01/07/12 1246  HGB 9.5* 11.2* 10.2*  HCT 25.9* 32.9* 27.1*  WBC 31.4* 28.2* 33.9*  PLT 715* 531* 537*     EKG: NSR, LAD, RBBB, no ischemia    IMPRESSION/RECS:    Empyema (01/07/2012) Results for orders placed during the hospital encounter of 01/07/12  MRSA PCR SCREENING     Status: Normal   Collection Time   01/07/12 10:54 PM      Component Value Range Status Comment   MRSA by PCR NEGATIVE  NEGATIVE Final    Anti-infectives     Start     Dose/Rate Route Frequency Ordered Stop   01/08/12 1300   vancomycin (VANCOCIN) IVPB 1000 mg/200 mL premix  Status:  Discontinued        1,000 mg 200 mL/hr over 60 Minutes Intravenous Every 12 hours 01/08/12 0856 01/09/12 1143   01/07/12 1300   vancomycin (VANCOCIN) 750 mg in sodium chloride 0.9 % 150 mL IVPB  Status:  Discontinued        750 mg 150 mL/hr over 60 Minutes Intravenous Every 12 hours 01/07/12 1235 01/08/12 0855   01/07/12 1230  piperacillin-tazobactam (ZOSYN) IVPB 3.375 g       3.375 g 12.5 mL/hr over 240 Minutes Intravenous Every 8 hours 01/07/12 1201     01/07/12 1200   piperacillin-tazobactam (ZOSYN) IVPB 3.375 g   Status:  Discontinued        3.375 g 12.5 mL/hr over 240 Minutes Intravenous 4 times per day 01/07/12 1158 01/07/12 1200            Assessment: description of fluid (foul smelling) suggests an anaerobic infection likely related to poor dentition and aspiration (noting history of excessive alcohol use) with culture 6/17 confirmatory this is microaerophic strep > Zoysn adequate started 6/20. On 6/24: VATS canceled due to surgical risk. On 6/25:  1.2L pus draining post chest tube    Plan:   - abx per ID but would appreciate choosing drugs that do not need monitoring with lab draw - monitor closely - repeat radiology 6/26, might need more chest tubes or thrombolytic   Alcohol abuse (01/07/2012)   Assessment: No prior history of DTs  Plan: PRN Lorazepam ordered. Monitor closely post op. Consider CIWA protocol if indicated   L lung nodule (01/07/2012)   Assessment: appearance worrisome for malignancy. Needs eval after the more pressing issue of empyema is addressed   Plan: f/u with Dr Maple Hudson post hospitalization  Anemia of chronic disease -- Bedelia Person witness; will not accept prbc  Lab 01/11/12 0505 01/09/12 0700 01/07/12 1246  HGB 9.5* 11.2* 10.2*   No obvious bleed on 6/25. Agree with Heme that likely reflects acute illness and chronic illnesss  PLAN - no transfusion ever for religious reasons  - aranesp   - adopt contrarian approach to care; avoid blood draws and therefore conserve hisblood  - dc daily lab draws (PCCM MD approval needed for lab draws) - choose drugs that do not need serum monitoring if possible  GLOBAL   Family updated at bedside  DENTITION  - poor; needs dental consult at some point   Dr. Kalman Shan, M.D., [facility].C.P Pulmonary and Critical Care Medicine Staff Physician [facility]  Pulmonary and Critical Care Pager: [phone], If no answer or between  15:00h - 7:00h: call [phone]  01/12/2012 4:55 PM

## 2012-01-12 NOTE — Progress Notes (Signed)
Patient ID: Bob Wilson, male   DOB: 1932/11/30, 78 y.o.   MRN: [id number]                   [address]            [place] [address]          [phone]     4 Days Post-Op Procedure(s) (LRB): VIDEO ASSISTED THORACOSCOPY (VATS)/EMPYEMA (Right)  LOS: 5 days   Subjective: Feels better today  Objective: Vital signs in last 24 hours: Patient Vitals for the past 24 hrs:  BP Temp Temp src Pulse Resp SpO2 Weight  01/12/12 1405 142/82 mmHg 96.7 F (35.9 C) Oral 96  16  91 % -  01/12/12 0951 127/91 mmHg - - 99  12  96 % -  01/12/12 0945 126/66 mmHg - - 92  14  100 % -  01/12/12 0940 130/66 mmHg - - 93  17  99 % -  01/12/12 0934 112/83 mmHg - - 95  15  97 % -  01/12/12 0930 122/61 mmHg - - 98  12  97 % -  01/12/12 0925 129/70 mmHg - - 93  12  95 % -  01/12/12 0921 133/65 mmHg - - 95  22  95 % -  01/12/12 0915 127/68 mmHg - - 93  21  95 % -  01/12/12 0408 125/73 mmHg 98.7 F (37.1 C) Oral 82  18  95 % 177 lb 11.1 oz (80.6 kg)  01/11/12 1938 111/69 mmHg 98.3 F (36.8 C) Oral 94  18  95 % -    Filed Weights   01/09/12 0507 01/11/12 0500 01/12/12 0408  Weight: 156 lb 8.4 oz (71 kg) 182 lb 12.2 oz (82.9 kg) 177 lb 11.1 oz (80.6 kg)    Hemodynamic parameters for last 24 hours:    Intake/Output from previous day: 06/24 0701 - 06/25 0700 In: -  Out: 575 [Urine:575] Intake/Output this shift:    Scheduled Meds:   . colchicine  0.6 mg Oral BID  . darbepoetin  25 mcg Subcutaneous Q7 days  . docusate sodium  100 mg Oral BID  . fentaNYL      . folic acid  1 mg Oral Daily  . furosemide  20 mg Intravenous Once  . iron polysaccharides  150 mg Oral Daily  . midazolam      . multivitamin with minerals  1 tablet Oral Daily  . piperacillin-tazobactam (ZOSYN)  IV  3.375 g Intravenous Q8H  . sodium chloride  3 mL Intravenous Q12H  . thiamine  100 mg Oral Daily  . DISCONTD: potassium chloride  20 mEq Oral BID   Continuous Infusions:  PRN Meds:.acetaminophen,  acetaminophen, alum & mag hydroxide-simeth, fentaNYL, fentaNYL, HYDROcodone-acetaminophen, LORazepam, LORazepam, midazolam, ondansetron (ZOFRAN) IV, ondansetron, DISCONTD: oxyCODONE-acetaminophen, DISCONTD: traMADol, DISCONTD: traMADol  General appearance: alert, cooperative and no distress Neurologic: intact Lungs: diminished breath sounds bibasilar 1200 ml of purlunet material from ct  Lab Results: CBC: Basename 01/11/12 0505  WBC 31.4*  HGB 9.5*  HCT 25.9*  PLT 715*   BMET:  Basename 01/12/12 0519 01/11/12 0505  NA 135 134*  K 4.2 5.3*  CL 101 101  CO2 22 24  GLUCOSE 78 76  BUN 21 20  CREATININE 1.11 1.17  CALCIUM 8.2* 8.5    PT/INR: No results found for this basename: LABPROT,INR in the last 72 hours   Radiology Ct Chest Wo Contrast  01/11/2012  *RADIOLOGY REPORT*  Clinical Data: Empyema in the right hemithorax.  Status post VATS on 01/07/2012.  CT CHEST WITHOUT CONTRAST  Technique:  Multidetector CT imaging of the chest was performed following the standard protocol without IV contrast.  Comparison: 12/30/2011.  Findings: Scattered small lymph nodes are seen and both axillary regions.  Small mediastinal lymph nodes are evident.  No left hilar lymphadenopathy.  Right lower lobe collapse / consolidation obscures the right hilum.  A large collection of heterogeneous fluid/debris and gas is seen in the posterior and inferior right hemithorax.  This collection measures approximately 19 x 11 x 11 cm.  Imaging features are consistent with the reported clinical history of empyema.  There is overlying collapse / consolidation of the posterior right middle and anterior right lower lobes.  Small lobular nodule in the lingula is stable.  IMPRESSION: Interval evolution of the right pleural fluid collection which is now 19 x 11 x 11 cm and contains heterogeneous material and gas. Imaging features are compatible with empyema.  Stable appearance of the lobular nodule within the lingula. Primary  bronchogenic neoplasm could have this appearance.  Original Report Authenticated By: ERIC A. MANSELL, M.D.   Ct Guided Abscess Drain  01/12/2012  *RADIOLOGY REPORT*  Clinical Data/Indication: RIGHT EMPYEMA  CT GUIDED ABCESS DRAINAGE WITH CATHETER  Sedation: Versed two mg, Fentanyl 50 mg.  Total Moderate Sedation Time: 15 minutes.  Procedure: The procedure, risks, benefits, and alternatives were explained to the patient. Questions regarding the procedure were encouraged and answered. The patient understands and consents to the procedure.  The right flank was prepped with betadine in a sterile fashion, and a sterile drape was applied covering the operative field. A sterile gown and sterile gloves were used for the procedure.  Under CT guidance, an 18 gauge needle was inserted into the right empyema and removed over an Amplatz.  A 16-French Thal-Quik drain was inserted.  Frank pus was aspirated and sent for culture.  It was sewn in place and attached to 20 cm water suction.  Findings: Imaging demonstrates placement of a 16-French right chest tube.  Complications: None.  IMPRESSION: Successful 16-French right thoracostomy for empyema.  Original Report Authenticated By: Donavan Burnet, M.D.   Dg Chest Port 1 View  01/11/2012  *RADIOLOGY REPORT*  Clinical Data: Follow-up for empyema status post VATS.  PORTABLE CHEST - 1 VIEW  Comparison: Chest x-ray 01/10/2012.  Findings: Again noted is a large right-sided pleural effusion, with associated regions of atelectasis and/or consolidation in the portions of the right lung, particularly in the right middle and lower lobes.  Left lung is well-aerated.  No definite left-sided pleural effusion.  Pulmonary vasculature appears normal.  Heart size is normal.  IMPRESSION: 1.  Allowing for slight differences in patient positioning, the radiographic appearance of the chest is essentially unchanged, as detailed above.  Original Report Authenticated By: Florencia Reasons, M.D.      Assessment/Plan: S/P Procedure(s) (LRB): VIDEO ASSISTED THORACOSCOPY (VATS)/EMPYEMA (Right) continue with drainage cath for now   Delight Ovens MD 01/12/2012 6:04 PM

## 2012-01-12 NOTE — ED Notes (Signed)
O2 2L [place] 

## 2012-01-13 ENCOUNTER — Inpatient Hospital Stay (HOSPITAL_COMMUNITY): Payer: Medicare Other

## 2012-01-13 LAB — CBC WITH DIFFERENTIAL/PLATELET
Basophils Relative: 0 % (ref 0–1)
Eosinophils Relative: 1 % (ref 0–5)
HCT: 24.7 % — ABNORMAL LOW (ref 39.0–52.0)
Hemoglobin: 9 g/dL — ABNORMAL LOW (ref 13.0–17.0)
Lymphs Abs: 2.4 10*3/uL (ref 0.7–4.0)
MCH: 32.6 pg (ref 26.0–34.0)
MCV: 89.5 fL (ref 78.0–100.0)
Monocytes Absolute: 2.9 10*3/uL — ABNORMAL HIGH (ref 0.1–1.0)
Monocytes Relative: 13 % — ABNORMAL HIGH (ref 3–12)
Neutro Abs: 16.5 10*3/uL — ABNORMAL HIGH (ref 1.7–7.7)
Platelets: 679 10*3/uL — ABNORMAL HIGH (ref 150–400)
RBC: 2.76 MIL/uL — ABNORMAL LOW (ref 4.22–5.81)

## 2012-01-13 MED ORDER — ENSURE COMPLETE PO LIQD
237.0000 mL | Freq: Two times a day (BID) | ORAL | Status: DC
  Administered 2012-01-13 – 2012-01-19 (×8): 237 mL via ORAL
  Filled 2012-01-13 (×2): qty 237

## 2012-01-13 NOTE — Progress Notes (Signed)
onc GWENDOLYN NISHI   DOB:18-Jul-1933   XB#:[id number]   ZHY#:[id number]  Subjective:"That pain is a dog." Feels breathing is improved. Having BMs "two times a day." Feels his body is "numb--can't get out of bed." Received pain med. Short time ago and "trying to rest."   Objective: elderly African American man examined in bed Filed Vitals:   01/13/12 0549  BP: 138/78  Pulse: 85  Temp: 98.3 F (36.8 C)  Resp: 20    Body mass index is 22.18 kg/(m^2).  Intake/Output Summary (Last 24 hours) at 01/13/12 0741 Last data filed at 01/13/12 0600  Gross per 24 hour  Intake    390 ml  Output    926 ml  Net   -536 ml     Sclerae unicteric  No cervical or [place] adenopathy  Lungs clear -- no rales or rhonchi  Heart regular rate and rhythm  Abdomen soft, +BS  MSK Right chest tube in place  Neuro nonfocal   CBG (last 3)  No results found for this basename: GLUCAP:3 in the last 72 hours   Labs:  Lab Results  Component Value Date   WBC 31.4* 01/11/2012   HGB 9.5* 01/11/2012   HCT 25.9* 01/11/2012   MCV 89.0 01/11/2012   PLT 715* 01/11/2012   NEUTROABS 28.5* 01/07/2012    Urine Studies No results found for this basename: UACOL:2,UAPR:2,USPG:2,UPH:2,UTP:2,UGL:2,UKET:2,UBIL:2,UHGB:2,UNIT:2,UROB:2,ULEU:2,UEPI:2,UWBC:2,URBC:2,UBAC:2,CAST:2,CRYS:2,UCOM:2,BILUA:2 in the last 72 hours  Basic Metabolic Panel:  Lab 01/12/12 2952 01/11/12 0505 01/07/12 1246  NA 135 134* 128*  K 4.2 5.3* --  CL 101 101 91*  CO2 22 24 24   GLUCOSE 78 76 90  BUN 21 20 24*  CREATININE 1.11 1.17 1.04  CALCIUM 8.2* 8.5 8.0*  MG -- -- --  PHOS -- -- --   GFR Estimated Creatinine Clearance: 63.5 ml/min (by C-G formula based on Cr of 1.11). Liver Function Tests:  Lab 01/07/12 1246  AST 13  ALT 8  ALKPHOS 208*  BILITOT 0.6  PROT 6.4  ALBUMIN 1.5*   No results found for this basename: LIPASE:5,AMYLASE:5 in the last 168 hours No results found for this basename: AMMONIA:5 in the last 168 hours Coagulation  profile  Lab 01/07/12 1246  INR 1.08  PROTIME --    CBC:  Lab 01/11/12 0505 01/09/12 0700 01/07/12 1246  WBC 31.4* 28.2* 33.9*  NEUTROABS -- -- 28.5*  HGB 9.5* 11.2* 10.2*  HCT 25.9* 32.9* 27.1*  MCV 89.0 90.1 89.4  PLT 715* 531* 537*   Cardiac Enzymes: No results found for this basename: CKTOTAL:5,CKMB:5,CKMBINDEX:5,TROPONINI:5 in the last 168 hours BNP: No components found with this basename: POCBNP:5 CBG: No results found for this basename: GLUCAP:5 in the last 168 hours D-Dimer No results found for this basename: DDIMER:2 in the last 72 hours Hgb A1c No results found for this basename: HGBA1C:2 in the last 72 hours Lipid Profile No results found for this basename: CHOL:2,HDL:2,LDLCALC:2,TRIG:2,CHOLHDL:2,LDLDIRECT:2 in the last 72 hours Thyroid function studies No results found for this basename: TSH,T4TOTAL,FREET3,T3FREE,THYROIDAB in the last 72 hours Anemia work up No results found for this basename: VITAMINB12:2,FOLATE:2,FERRITIN:2,TIBC:2,IRON:2,RETICCTPCT:2 in the last 72 hours Microbiology Recent Results (from the past 240 hour(s))  BODY FLUID CULTURE     Status: Normal   Collection Time   01/04/12 12:03 PM      Component Value Range Status Comment   Specimen Description FLUID RT CHEST   Final    Special Requests NONE   Final    Gram Stain  Final    Value: NO WBC SEEN     NO ORGANISMS SEEN   Culture     Final    Value: FEW MICROAEROPHILIC STREPTOCOCCI     Note: Standardized susceptibility testing for this organism is not available.   Report Status 01/08/2012 FINAL   Final   AFB CULTURE WITH SMEAR     Status: Normal (Preliminary result)   Collection Time   01/04/12 12:03 PM      Component Value Range Status Comment   Specimen Description FLUID CHEST   Final    Special Requests NONE   Final    ACID FAST SMEAR NO ACID FAST BACILLI SEEN   Final    Culture     Final    Value: CULTURE WILL BE EXAMINED FOR 6 WEEKS BEFORE ISSUING A FINAL REPORT   Report  Status PENDING   Incomplete   FUNGUS CULTURE W SMEAR     Status: Normal (Preliminary result)   Collection Time   01/04/12 12:03 PM      Component Value Range Status Comment   Specimen Description FLUID CHEST RIGHT   Final    Special Requests NONE   Final    Fungal Smear NO YEAST OR FUNGAL ELEMENTS SEEN   Final    Culture CULTURE IN PROGRESS FOR FOUR WEEKS   Final    Report Status PENDING   Incomplete   MRSA PCR SCREENING     Status: Normal   Collection Time   01/07/12 10:54 PM      Component Value Range Status Comment   MRSA by PCR NEGATIVE  NEGATIVE Final   CULTURE, ROUTINE-ABSCESS     Status: Normal (Preliminary result)   Collection Time   01/12/12 10:08 AM      Component Value Range Status Comment   Specimen Description ABSCESS RIGHT LUNG   Final    Special Requests NONE   Final    Gram Stain     Final    Value: ABUNDANT WBC PRESENT,BOTH PMN AND MONONUCLEAR     NO SQUAMOUS EPITHELIAL CELLS SEEN     NO ORGANISMS SEEN   Culture PENDING   Incomplete    Report Status PENDING   Incomplete       Studies:  Ct Chest Wo Contrast  01/11/2012  *RADIOLOGY REPORT*  Clinical Data: Empyema in the right hemithorax.  Status post VATS on 01/07/2012.  CT CHEST WITHOUT CONTRAST  Technique:  Multidetector CT imaging of the chest was performed following the standard protocol without IV contrast.  Comparison: 12/30/2011.  Findings: Scattered small lymph nodes are seen and both axillary regions.  Small mediastinal lymph nodes are evident.  No left hilar lymphadenopathy.  Right lower lobe collapse / consolidation obscures the right hilum.  A large collection of heterogeneous fluid/debris and gas is seen in the posterior and inferior right hemithorax.  This collection measures approximately 19 x 11 x 11 cm.  Imaging features are consistent with the reported clinical history of empyema.  There is overlying collapse / consolidation of the posterior right middle and anterior right lower lobes.  Small lobular  nodule in the lingula is stable.  IMPRESSION: Interval evolution of the right pleural fluid collection which is now 19 x 11 x 11 cm and contains heterogeneous material and gas. Imaging features are compatible with empyema.  Stable appearance of the lobular nodule within the lingula. Primary bronchogenic neoplasm could have this appearance.  Original Report Authenticated By: ERIC A. MANSELL, M.D.  Ct Guided Abscess Drain  01/12/2012  *RADIOLOGY REPORT*  Clinical Data/Indication: RIGHT EMPYEMA  CT GUIDED ABCESS DRAINAGE WITH CATHETER  Sedation: Versed two mg, Fentanyl 50 mg.  Total Moderate Sedation Time: 15 minutes.  Procedure: The procedure, risks, benefits, and alternatives were explained to the patient. Questions regarding the procedure were encouraged and answered. The patient understands and consents to the procedure.  The right flank was prepped with betadine in a sterile fashion, and a sterile drape was applied covering the operative field. A sterile gown and sterile gloves were used for the procedure.  Under CT guidance, an 18 gauge needle was inserted into the right empyema and removed over an Amplatz.  A 16-French Thal-Quik drain was inserted.  Frank pus was aspirated and sent for culture.  It was sewn in place and attached to 20 cm water suction.  Findings: Imaging demonstrates placement of a 16-French right chest tube.  Complications: None.  IMPRESSION: Successful 16-French right thoracostomy for empyema.  Original Report Authenticated By: Donavan Burnet, M.D.    Assessment: 78 y.o. [place] man with a history of significant ETOH abuse  (1) Right empyema likely secondary to aspiration following episodes of LOC late May 2013; s/p R VATS drainage and chest-tube placement 01/08/2012 (2) 1.5 cm spiculated lingular lesion, consistent with a primary lung malignancy, clinically T1 N0 or stage 1  (3) anemia in the setting of refusal of transfusion for religious reasons, on weekly aranesp started (4)  elevated PSA with elevated alkaline phosphatase, but no bone lesions noted on CT scans  Plan: as far as anemia is concerned will check Hb and reticulocytes July 1st; he is already receiving low-dose aranesp and folate, and has adequate ferritin and B12. The Left lingular mass needs to be addressed when the patient has fully recovered from his Right empyema and his hemoglobin is >10. The elevated PSA may be left to his primary MD's discretion, but given the patient's age and comorbidities the option of no further evaluation may be most reasonable.  Will follow peripherally.  Lunette Tapp C 01/13/2012

## 2012-01-13 NOTE — Progress Notes (Signed)
PULMONARY/CCM CONSULT NOTE  Requesting MD/Service: Burney/TCTS Date of admission: 6/20  Date of consult: 6/20 Reason for consultation: peri-op med mgmt  Pt Profile:  79 yobm initially seen in consultation on 6/14 by Dr Maple Hudson for eval of L lung nodule and large R effusion. An US guided thoracentesis was performed by IR on 6/17 and 450 cc of foul smelling cloudy fluid consistent with empyema was removed > admitted 6/20 by TCTS service in preparation for VATS drainage of R pleural space planned for 6/21   EVENTS 6/24  IR plans pig tail on 6/25. Due to drop in hgb Dr Edwyna Shell deferred VATS 6/25: s/p pigtail IR and 1200cc pus drained out   SUBJECTIVE/OVERNIGHT/INTERVAL HX Had chest tube drained 225 cc pus. Having some pain and getting fentanyl No air leak  Filed Vitals:   01/12/12 1405 01/12/12 2227 01/13/12 0549 01/13/12 1442  BP: 142/82 143/81 138/78 142/80  Pulse: 96 85 85 76  Temp: 96.7 F (35.9 C) 97.7 F (36.5 C) 98.3 F (36.8 C) 97 F (36.1 C)  TempSrc: Oral Oral Oral Oral  Resp: 16 16 20 18   Height:      Weight:   80.5 kg (177 lb 7.5 oz)   SpO2: 91% 97% 97% 99%    EXAM:  Gen: WDWN in NAD HEENT: very poor dentition Neck:  No JVD, no LAN Chest Rt chest tube Lungs: dull to percussion with bronchial BS approx 1/2 up on R. Clear on L Cardiovascular: RRR s M Abdomen: NABS, soft, NT Ext: 1-2 symmetric edema Neuro: no focal deficits . Sleeping due to fentanyl for pain  DATA:  Ct Guided Abscess Drain  01/12/2012  *RADIOLOGY REPORT*  Clinical Data/Indication: RIGHT EMPYEMA  CT GUIDED ABCESS DRAINAGE WITH CATHETER  Sedation: Versed two mg, Fentanyl 50 mg.  Total Moderate Sedation Time: 15 minutes.  Procedure: The procedure, risks, benefits, and alternatives were explained to the patient. Questions regarding the procedure were encouraged and answered. The patient understands and consents to the procedure.  The right flank was prepped with betadine in a sterile fashion, and a  sterile drape was applied covering the operative field. A sterile gown and sterile gloves were used for the procedure.  Under CT guidance, an 18 gauge needle was inserted into the right empyema and removed over an Amplatz.  A 16-French Thal-Quik drain was inserted.  Frank pus was aspirated and sent for culture.  It was sewn in place and attached to 20 cm water suction.  Findings: Imaging demonstrates placement of a 16-French right chest tube.  Complications: None.  IMPRESSION: Successful 16-French right thoracostomy for empyema.  Original Report Authenticated By: Donavan Burnet, M.D.   Dg Chest Port 1 View  01/13/2012  *RADIOLOGY REPORT*  Clinical Data: Empyema  PORTABLE CHEST - 1 VIEW  Comparison:   the previous day's study  Findings: The right chest tube remains at the lung base.  There is been partial resolution of the loculated right basilar pleural collection.  Persistent atelectasis/infiltrate at the right lung base.  Minimal linear scarring or atelectasis at the left lung base.  Heart size remains normal. No pneumothorax.  IMPRESSION:  1.  Partial evacuation of right pleural collection post chest tube placement.  Original Report Authenticated By: Osa Craver, M.D.      Lab 01/12/12 0519 01/11/12 0505 01/07/12 1246  NA 135 134* 128*  K 4.2 5.3* 3.0*  CL 101 101 91*  CO2 22 24 24   BUN 21 20 24*  CREATININE 1.11 1.17 1.04  GLUCOSE 78 76 90    Lab 01/13/12 1516 01/11/12 0505 01/09/12 0700  HGB 9.0* 9.5* 11.2*  HCT 24.7* 25.9* 32.9*  WBC 22.0* 31.4* 28.2*  PLT 679* 715* 531*     EKG: NSR, LAD, RBBB, no ischemia    IMPRESSION/RECS:    Empyema (01/07/2012) Results for orders placed during the hospital encounter of 01/07/12  MRSA PCR SCREENING     Status: Normal   Collection Time   01/07/12 10:54 PM      Component Value Range Status Comment   MRSA by PCR NEGATIVE  NEGATIVE Final   CULTURE, ROUTINE-ABSCESS     Status: Normal (Preliminary result)   Collection Time    01/12/12 10:08 AM      Component Value Range Status Comment   Specimen Description ABSCESS RIGHT LUNG   Final    Special Requests NONE   Final    Gram Stain     Final    Value: ABUNDANT WBC PRESENT,BOTH PMN AND MONONUCLEAR     NO SQUAMOUS EPITHELIAL CELLS SEEN     NO ORGANISMS SEEN   Culture NO GROWTH 1 DAY   Final    Report Status PENDING   Incomplete    Anti-infectives     Start     Dose/Rate Route Frequency Ordered Stop   01/08/12 1300   vancomycin (VANCOCIN) IVPB 1000 mg/200 mL premix  Status:  Discontinued        1,000 mg 200 mL/hr over 60 Minutes Intravenous Every 12 hours 01/08/12 0856 01/09/12 1143   01/07/12 1300   vancomycin (VANCOCIN) 750 mg in sodium chloride 0.9 % 150 mL IVPB  Status:  Discontinued        750 mg 150 mL/hr over 60 Minutes Intravenous Every 12 hours 01/07/12 1235 01/08/12 0855   01/07/12 1230  piperacillin-tazobactam (ZOSYN) IVPB 3.375 g       3.375 g 12.5 mL/hr over 240 Minutes Intravenous Every 8 hours 01/07/12 1201     01/07/12 1200   piperacillin-tazobactam (ZOSYN) IVPB 3.375 g  Status:  Discontinued        3.375 g 12.5 mL/hr over 240 Minutes Intravenous 4 times per day 01/07/12 1158 01/07/12 1200            Assessment: description of fluid (foul smelling) suggests an anaerobic infection likely related to poor dentition and aspiration (noting history of excessive alcohol use) with culture 6/17 confirmatory this is microaerophic strep > Zoysn adequate started 6/20. On 6/24: VATS canceled due to surgical risk. On 6/25:  1.2L pus draining post chest tube    Plan:   - abx per ID but would appreciate choosing drugs that do not need monitoring with lab draw - D/w Dr Edwyna Shell 6/26, hold off on chest tubes or streptokinase   Alcohol abuse (01/07/2012)   Assessment: No prior history of DTs   Plan: PRN Lorazepam ordered. Monitor closely post op. Consider CIWA protocol if indicated   L lung nodule (01/07/2012)   Assessment: appearance worrisome for  malignancy. Needs eval after the more pressing issue of empyema is addressed   Plan: f/u with Dr Maple Hudson post hospitalization  Anemia of chronic disease -- Bedelia Person witness; will not accept prbc  Lab 01/13/12 1516 01/11/12 0505 01/09/12 0700 01/07/12 1246  HGB 9.0* 9.5* 11.2* 10.2*   No obvious bleed on 6/25. Agree with Heme that likely reflects acute illness and chronic illnesss  PLAN - no transfusion ever for religious reasons  -  aranesp   - adopt contrarian approach to care; avoid blood draws and therefore conserve blood  - dc daily lab draws (PCCM MD approval needed for lab draws) -plan for 6/28 - choose drugs that do not need serum monitoring if possible  GLOBAL   Family updated at bedside  DENTITION  - poor; needs dental consult at some point  Cyril Mourning MD. [facility] Pager [phone] If no response call [phone]     01/13/2012 4:31 PM

## 2012-01-13 NOTE — Progress Notes (Signed)
5 Days Post-Op  Subjective: Rt chest empyema chest drain placed 6/25 Pt fees better already; breathing easier  Objective: Vital signs in last 24 hours: Temp:  [96.7 F (35.9 C)-98.3 F (36.8 C)] 98.3 F (36.8 C) (06/26 0549) Pulse Rate:  [85-99] 85  (06/26 0549) Resp:  [12-20] 20  (06/26 0549) BP: (112-143)/(61-91) 138/78 mmHg (06/26 0549) SpO2:  [91 %-100 %] 97 % (06/26 0549) Weight:  [177 lb 7.5 oz (80.5 kg)] 177 lb 7.5 oz (80.5 kg) (06/26 0549) Last BM Date: 01/12/12  Intake/Output from previous day: 06/25 0701 - 06/26 0700 In: 390 [P.O.:240; IV Piggyback:150] Out: 926 [Urine:700; Stool:1; Chest Tube:225] Intake/Output this shift:    PE:  Afeb; VSS Chest tube intact No air leak; site clean and dry Output: 1420 cc in pleurvac; 225 cc in 24 hrs per chart Yellow fluid output cxr better today Wbc pending  Lab Results:   [facility] 01/11/12 0505  WBC 31.4*  HGB 9.5*  HCT 25.9*  PLT 715*   BMET  Basename 01/12/12 0519 01/11/12 0505  NA 135 134*  K 4.2 5.3*  CL 101 101  CO2 22 24  GLUCOSE 78 76  BUN 21 20  CREATININE 1.11 1.17  CALCIUM 8.2* 8.5   PT/INR No results found for this basename: LABPROT:2,INR:2 in the last 72 hours ABG No results found for this basename: PHART:2,PCO2:2,PO2:2,HCO3:2 in the last 72 hours  Studies/Results: Ct Chest Wo Contrast  01/11/2012  *RADIOLOGY REPORT*  Clinical Data: Empyema in the right hemithorax.  Status post VATS on 01/07/2012.  CT CHEST WITHOUT CONTRAST  Technique:  Multidetector CT imaging of the chest was performed following the standard protocol without IV contrast.  Comparison: 12/30/2011.  Findings: Scattered small lymph nodes are seen and both axillary regions.  Small mediastinal lymph nodes are evident.  No left hilar lymphadenopathy.  Right lower lobe collapse / consolidation obscures the right hilum.  A large collection of heterogeneous fluid/debris and gas is seen in the posterior and inferior right hemithorax.  This  collection measures approximately 19 x 11 x 11 cm.  Imaging features are consistent with the reported clinical history of empyema.  There is overlying collapse / consolidation of the posterior right middle and anterior right lower lobes.  Small lobular nodule in the lingula is stable.  IMPRESSION: Interval evolution of the right pleural fluid collection which is now 19 x 11 x 11 cm and contains heterogeneous material and gas. Imaging features are compatible with empyema.  Stable appearance of the lobular nodule within the lingula. Primary bronchogenic neoplasm could have this appearance.  Original Report Authenticated By: ERIC A. MANSELL, M.D.   Ct Guided Abscess Drain  01/12/2012  *RADIOLOGY REPORT*  Clinical Data/Indication: RIGHT EMPYEMA  CT GUIDED ABCESS DRAINAGE WITH CATHETER  Sedation: Versed two mg, Fentanyl 50 mg.  Total Moderate Sedation Time: 15 minutes.  Procedure: The procedure, risks, benefits, and alternatives were explained to the patient. Questions regarding the procedure were encouraged and answered. The patient understands and consents to the procedure.  The right flank was prepped with betadine in a sterile fashion, and a sterile drape was applied covering the operative field. A sterile gown and sterile gloves were used for the procedure.  Under CT guidance, an 18 gauge needle was inserted into the right empyema and removed over an Amplatz.  A 16-French Thal-Quik drain was inserted.  Frank pus was aspirated and sent for culture.  It was sewn in place and attached to 20 cm water suction.  Findings: Imaging demonstrates placement of a 16-French right chest tube.  Complications: None.  IMPRESSION: Successful 16-French right thoracostomy for empyema.  Original Report Authenticated By: Donavan Burnet, M.D.   Dg Chest Port 1 View  01/13/2012  *RADIOLOGY REPORT*  Clinical Data: Empyema  PORTABLE CHEST - 1 VIEW  Comparison:   the previous day's study  Findings: The right chest tube remains at the  lung base.  There is been partial resolution of the loculated right basilar pleural collection.  Persistent atelectasis/infiltrate at the right lung base.  Minimal linear scarring or atelectasis at the left lung base.  Heart size remains normal. No pneumothorax.  IMPRESSION:  1.  Partial evacuation of right pleural collection post chest tube placement.  Original Report Authenticated By: Osa Craver, M.D.    Anti-infectives: Anti-infectives     Start     Dose/Rate Route Frequency Ordered Stop   01/08/12 1300   vancomycin (VANCOCIN) IVPB 1000 mg/200 mL premix  Status:  Discontinued        1,000 mg 200 mL/hr over 60 Minutes Intravenous Every 12 hours 01/08/12 0856 01/09/12 1143   01/07/12 1300   vancomycin (VANCOCIN) 750 mg in sodium chloride 0.9 % 150 mL IVPB  Status:  Discontinued        750 mg 150 mL/hr over 60 Minutes Intravenous Every 12 hours 01/07/12 1235 01/08/12 0855   01/07/12 1230  piperacillin-tazobactam (ZOSYN) IVPB 3.375 g       3.375 g 12.5 mL/hr over 240 Minutes Intravenous Every 8 hours 01/07/12 1201     01/07/12 1200   piperacillin-tazobactam (ZOSYN) IVPB 3.375 g  Status:  Discontinued        3.375 g 12.5 mL/hr over 240 Minutes Intravenous 4 times per day 01/07/12 1158 01/07/12 1200          Assessment/Plan: s/p Procedure(s) (LRB): VIDEO ASSISTED THORACOSCOPY (VATS)/EMPYEMA (Right)  Rt chest tube drain placed 6/25 Feeling and breathing better Output 225 cc in 24 hrs per chart Will follow  Anuj Summons A 01/13/2012

## 2012-01-13 NOTE — Progress Notes (Signed)
INFECTIOUS DISEASE PROGRESS NOTE  ID: Bob Wilson is a 78 y.o. male with  Empyema s/p pig tail catheter placement on 6/25  Subjective: Patient feels somewhat better, not in any significant pain. Only an addn 400 mL drainage collected in chest tube collection unit since yesterday. afebrile  Abtx: piptazo   Medications:     . colchicine  0.6 mg Oral BID  . darbepoetin  25 mcg Subcutaneous Q7 days  . docusate sodium  100 mg Oral BID  . fentaNYL      . folic acid  1 mg Oral Daily  . iron polysaccharides  150 mg Oral Daily  . midazolam      . multivitamin with minerals  1 tablet Oral Daily  . piperacillin-tazobactam (ZOSYN)  IV  3.375 g Intravenous Q8H  . sodium chloride  3 mL Intravenous Q12H  . thiamine  100 mg Oral Daily    Objective: Vital signs in last 24 hours: Temp:  [97.7 F (36.5 C)-98.3 F (36.8 C)] 98.3 F (36.8 C) (06/26 0549) Pulse Rate:  [85] 85  (06/26 0549) Resp:  [16-20] 20  (06/26 0549) BP: (138-143)/(78-81) 138/78 mmHg (06/26 0549) SpO2:  [97 %] 97 % (06/26 0549) Weight:  [177 lb 7.5 oz (80.5 kg)] 177 lb 7.5 oz (80.5 kg) (06/26 0549) Physical Exam  Constitutional: He is oriented to person, place, and time. He appears well-developed and well-nourished. No distress.  HENT:  Mouth/Throat: Oropharynx is clear and moist. No oropharyngeal exudate.  Cardiovascular: Normal rate, regular rhythm and normal heart sounds. Exam reveals no gallop and no friction rub.  No murmur heard.  Pulmonary/Chest: Effort normal and breath sounds normal. No respiratory distress. He has no wheezes. Right side has very little airmovement, decreased to percussion 2/3rd way up. Right pigtail in place. Abdominal: Soft. Bowel sounds are normal. He exhibits no distension. There is no tenderness.  Lymphadenopathy:  He has no cervical adenopathy.  Neurological: He is alert and oriented to person, place, and time.  Skin: Skin is warm and dry. No rash noted. No erythema.  Psychiatric: He  has a normal mood and affect. His behavior is normal.    Lab Results  Basename 01/12/12 0519 01/11/12 0505  WBC -- 31.4*  HGB -- 9.5*  HCT -- 25.9*  NA 135 134*  K 4.2 5.3*  CL 101 101  CO2 22 24  BUN 21 20  CREATININE 1.11 1.17  GLU -- --    Microbiology: Recent Results (from the past 240 hour(s))  BODY FLUID CULTURE     Status: Normal   Collection Time   01/04/12 12:03 PM      Component Value Range Status Comment   Specimen Description FLUID RT CHEST   Final    Special Requests NONE   Final    Gram Stain     Final    Value: NO WBC SEEN     NO ORGANISMS SEEN   Culture     Final    Value: FEW MICROAEROPHILIC STREPTOCOCCI     Note: Standardized susceptibility testing for this organism is not available.   Report Status 01/08/2012 FINAL   Final   AFB CULTURE WITH SMEAR     Status: Normal (Preliminary result)   Collection Time   01/04/12 12:03 PM      Component Value Range Status Comment   Specimen Description FLUID CHEST   Final    Special Requests NONE   Final    ACID FAST SMEAR NO ACID  FAST BACILLI SEEN   Final    Culture     Final    Value: CULTURE WILL BE EXAMINED FOR 6 WEEKS BEFORE ISSUING A FINAL REPORT   Report Status PENDING   Incomplete   FUNGUS CULTURE W SMEAR     Status: Normal (Preliminary result)   Collection Time   01/04/12 12:03 PM      Component Value Range Status Comment   Specimen Description FLUID CHEST RIGHT   Final    Special Requests NONE   Final    Fungal Smear NO YEAST OR FUNGAL ELEMENTS SEEN   Final    Culture CULTURE IN PROGRESS FOR FOUR WEEKS   Final    Report Status PENDING   Incomplete   MRSA PCR SCREENING     Status: Normal   Collection Time   01/07/12 10:54 PM      Component Value Range Status Comment   MRSA by PCR NEGATIVE  NEGATIVE Final   CULTURE, ROUTINE-ABSCESS     Status: Normal (Preliminary result)   Collection Time   01/12/12 10:08 AM      Component Value Range Status Comment   Specimen Description ABSCESS RIGHT LUNG   Final     Special Requests NONE   Final    Gram Stain     Final    Value: ABUNDANT WBC PRESENT,BOTH PMN AND MONONUCLEAR     NO SQUAMOUS EPITHELIAL CELLS SEEN     NO ORGANISMS SEEN   Culture NO GROWTH 1 DAY   Final    Report Status PENDING   Incomplete     Studies/Results: Ct Guided Abscess Drain  01/12/2012  *RADIOLOGY REPORT*  Clinical Data/Indication: RIGHT EMPYEMA  CT GUIDED ABCESS DRAINAGE WITH CATHETER  Sedation: Versed two mg, Fentanyl 50 mg.  Total Moderate Sedation Time: 15 minutes.  Procedure: The procedure, risks, benefits, and alternatives were explained to the patient. Questions regarding the procedure were encouraged and answered. The patient understands and consents to the procedure.  The right flank was prepped with betadine in a sterile fashion, and a sterile drape was applied covering the operative field. A sterile gown and sterile gloves were used for the procedure.  Under CT guidance, an 18 gauge needle was inserted into the right empyema and removed over an Amplatz.  A 16-French Thal-Quik drain was inserted.  Frank pus was aspirated and sent for culture.  It was sewn in place and attached to 20 cm water suction.  Findings: Imaging demonstrates placement of a 16-French right chest tube.  Complications: None.  IMPRESSION: Successful 16-French right thoracostomy for empyema.  Original Report Authenticated By: Donavan Burnet, M.D.   Dg Chest Port 1 View  01/13/2012  *RADIOLOGY REPORT*  Clinical Data: Empyema  PORTABLE CHEST - 1 VIEW  Comparison:   the previous day's study  Findings: The right chest tube remains at the lung base.  There is been partial resolution of the loculated right basilar pleural collection.  Persistent atelectasis/infiltrate at the right lung base.  Minimal linear scarring or atelectasis at the left lung base.  Heart size remains normal. No pneumothorax.  IMPRESSION:  1.  Partial evacuation of right pleural collection post chest tube placement.  Original Report  Authenticated By: Osa Craver, M.D.     Assessment/Plan: Empyema = currently on piptazo. cxr shows some improvement but still has significant amount of loculated effusion on xray today. Await for repeat culture results to come back in next 2-3 days to see if  other bacteria is isolated other than microaerophilic strep. Currently being treated as polymicrobial.  Leukocytosis = thought to be due to empyema, would repeat cbc with diff to see that leukocytosis is improving since starting to drain his empyema. Will check cbc with diff today (in pediatric tube) to minimize blood draw volume  Nicolette Gieske Infectious Diseases 01/13/2012, 2:36 PM

## 2012-01-13 NOTE — Clinical Social Work Psychosocial (Signed)
     Clinical Social Work Department BRIEF PSYCHOSOCIAL ASSESSMENT 01/13/2012  Patient:  Bob Wilson, Bob Wilson     Account Number:  [id number]     Admit date:  01/07/2012  Clinical Social Worker:  Robin Searing  Date/Time:  01/12/2012 04:00 PM  Referred by:  Care Management  Date Referred:  01/12/2012 Referred for  SNF Placement   Other Referral:   Interview type:  Other - See comment Other interview type:   Met with patient and his wife at bedside    PSYCHOSOCIAL DATA Living Status:  WIFE Admitted from facility:   Level of care:   Primary support name:  wife- Pattricia Boss Primary support relationship to patient:  FAMILY Degree of support available:   good    CURRENT CONCERNS Current Concerns  Post-Acute Placement   Other Concerns:    SOCIAL WORK ASSESSMENT / PLAN I met with patient and his wife to discuss possible SNF at d/c. They both are wanting to pursue home with HH/DME and not SNF. Wife states she can be taught how to care for the chest tube-   Assessment/plan status:  Other - See comment Other assessment/ plan:   Will continue to discuss these plans with the patient and family for further d/c planning.   Information/referral to community resources:   SNF list in case patient decides to go SNF, HH    PATIENTS/FAMILYS RESPONSE TO PLAN OF CARE: Patient and wife are agreeable to these plans.

## 2012-01-13 NOTE — Progress Notes (Addendum)
5 Days s/p Right chest tube placement; 1 day s/p 16 French chest tube placement  Subjective: Patient resting this am. States he did not sleep well.  Objective: Vital signs in last 24 hours: Patient Vitals for the past 24 hrs:  BP Temp Temp src Pulse Resp SpO2 Weight  01/13/12 0549 138/78 mmHg 98.3 F (36.8 C) Oral 85  20  97 % 177 lb 7.5 oz (80.5 kg)  01/12/12 2227 143/81 mmHg 97.7 F (36.5 C) Oral 85  16  97 % -  01/12/12 1405 142/82 mmHg 96.7 F (35.9 C) Oral 96  16  91 % -  01/12/12 0951 127/91 mmHg - - 99  12  96 % -  01/12/12 0945 126/66 mmHg - - 92  14  100 % -  01/12/12 0940 130/66 mmHg - - 93  17  99 % -  01/12/12 0934 112/83 mmHg - - 95  15  97 % -  01/12/12 0930 122/61 mmHg - - 98  12  97 % -  01/12/12 0925 129/70 mmHg - - 93  12  95 % -  01/12/12 0921 133/65 mmHg - - 95  22  95 % -  01/12/12 0915 127/68 mmHg - - 93  21  95 % -      Intake/Output from previous day: 06/25 0701 - 06/26 0700 In: 390 [P.O.:240; IV Piggyback:150] Out: 926 [Urine:700; Stool:1; Chest Tube:225]   Physical Exam:  Cardiovascular: RRR, no murmurs, gallops, or rubs. Pulmonary: Clear on left and slightly diminished at right base; no rales, wheezes, or rhonchi. Abdomen: Soft, non tender, bowel sounds present. Chest Tube:No air leak  Lab Results: CBC: Basename 01/11/12 0505  WBC 31.4*  HGB 9.5*  HCT 25.9*  PLT 715*   BMET:  Basename 01/12/12 0519 01/11/12 0505  NA 135 134*  K 4.2 5.3*  CL 101 101  CO2 22 24  GLUCOSE 78 76  BUN 21 20  CREATININE 1.11 1.17  CALCIUM 8.2* 8.5    PT/INR: No results found for this basename: LABPROT,INR in the last 72 hours ABG:  INR: Will add last result for INR, ABG once components are confirmed Will add last 4 CBG results once components are confirmed  Assessment/Plan:   1.  Pulmonary - s/p placement of 44 French tube for drainage of empyema yesterday.Chest tube output 225 cc last 24 hours.Ct is to suction and there is no air leak. Continue  for now.CXR this am shows improvement in aeration on right, right pleural effusion and atelectasis.On Zosyn IV. 2.Anemia-on Aranesp, folic acid, iron. Appreciate hematology's assistance.  ZIMMERMAN,DONIELLE MPA-C 01/13/2012  CXR improved. Repeat lab and cxr am.

## 2012-01-14 ENCOUNTER — Ambulatory Visit (HOSPITAL_COMMUNITY): Admission: RE | Admit: 2012-01-14 | Payer: Medicare Other | Source: Ambulatory Visit | Admitting: Thoracic Surgery

## 2012-01-14 ENCOUNTER — Inpatient Hospital Stay (HOSPITAL_COMMUNITY): Payer: Medicare Other

## 2012-01-14 ENCOUNTER — Inpatient Hospital Stay (HOSPITAL_COMMUNITY)
Admission: RE | Admit: 2012-01-14 | Discharge: 2012-01-14 | Payer: Medicare Other | Source: Ambulatory Visit | Attending: Internal Medicine | Admitting: Internal Medicine

## 2012-01-14 LAB — BASIC METABOLIC PANEL
BUN: 21 mg/dL (ref 6–23)
CO2: 25 mEq/L (ref 19–32)
Calcium: 8.1 mg/dL — ABNORMAL LOW (ref 8.4–10.5)
GFR calc non Af Amer: 45 mL/min — ABNORMAL LOW (ref >90)
Glucose, Bld: 90 mg/dL (ref 70–99)

## 2012-01-14 LAB — CBC
HCT: 23.9 % — ABNORMAL LOW (ref 39.0–52.0)
Hemoglobin: 8.7 g/dL — ABNORMAL LOW (ref 13.0–17.0)
MCH: 32 pg (ref 26.0–34.0)
MCHC: 36.4 g/dL — ABNORMAL HIGH (ref 30.0–36.0)
RBC: 2.72 MIL/uL — ABNORMAL LOW (ref 4.22–5.81)

## 2012-01-14 NOTE — Progress Notes (Addendum)
5 Days s/p Right chest tube placement; 1 day s/p 16 French chest tube placement  Subjective: Patient resting this am. States he did not sleep well.  Objective: Vital signs in last 24 hours: Patient Vitals for the past 24 hrs:  BP Temp Temp src Pulse Resp SpO2 Weight  01/14/12 0432 146/80 mmHg 98.4 F (36.9 C) Oral 68  16  98 % 175 lb 0.7 oz (79.4 kg)  01/13/12 2023 128/73 mmHg 97.8 F (36.6 C) Oral 79  18  97 % -  01/13/12 1442 142/80 mmHg 97 F (36.1 C) Oral 76  18  99 % -      Intake/Output from previous day: 06/26 0701 - 06/27 0700 In: 240 [P.O.:240] Out: 575 [Urine:575]   Physical Exam:  Cardiovascular: RRR, no murmurs, gallops, or rubs. Pulmonary: Clear on left and slightly diminished at right base; no rales, wheezes, or rhonchi. Abdomen: Soft, non tender, bowel sounds present. Chest Tube:No air leak  Lab Results: CBC:  Basename 01/14/12 0450 01/13/12 1516  WBC 19.6* 22.0*  HGB 8.7* 9.0*  HCT 23.9* 24.7*  PLT 684* 679*   BMET:   Basename 01/14/12 0450 01/12/12 0519  NA 135 135  K 3.8 4.2  CL 101 101  CO2 25 22  GLUCOSE 90 78  BUN 21 21  CREATININE 1.44* 1.11  CALCIUM 8.1* 8.2*    PT/INR: No results found for this basename: LABPROT,INR in the last 72 hours ABG:  INR: Will add last result for INR, ABG once components are confirmed Will add last 4 CBG results once components are confirmed  Assessment/Plan:   1.  Pulmonary - s/p placement of 79 French tube for drainage of empyema yesterday.Questionable chest tube output last 24 hours.Appears 70 cc last 12 hours.Ct is to waterseal and there is no air leak. Continue for now.CXR this am shows right pleural effusion and atelectasis.On Zosyn IV. 2.Anemia-H and H this am 8.7 and 23.9.Patient is a Nurse, mental health witness and refuses blood.On Aranesp, folic acid, iron. 3.Leukocytosis-WBC decreased to 19,600.Etiology related to empyema.  ZIMMERMAN,DONIELLE MPA-C 01/14/2012 WBC improving. Patient is very weak.Still  draining. We will switch to a mini express 500. Will get a PA and lateral in a.m. can probably be eventually discharged with a mini express 500. Need to ambulate the patient. Hematocrit 24.

## 2012-01-14 NOTE — Progress Notes (Signed)
6 Days Post-Op  Subjective: Rt chest tube drain placed 6/25 Feeling better daily Cough less   Objective: Vital signs in last 24 hours: Temp:  [97 F (36.1 C)-98.4 F (36.9 C)] 98.4 F (36.9 C) (06/27 0432) Pulse Rate:  [68-79] 68  (06/27 0432) Resp:  [16-18] 16  (06/27 0432) BP: (128-146)/(73-80) 146/80 mmHg (06/27 0432) SpO2:  [97 %-99 %] 98 % (06/27 0432) Weight:  [175 lb 0.7 oz (79.4 kg)] 175 lb 0.7 oz (79.4 kg) (06/27 0432) Last BM Date: 01/13/12  Intake/Output from previous day: 06/26 0701 - 06/27 0700 In: 240 [P.O.:240] Out: 575 [Urine:575] Intake/Output this shift:    PE:  Afeb; VSS 1500 cc in pleurvac (1420 cc 6/26) Serous yellow Site clean and dry No air leak cxr - smaller effusion  Lab Results:   Basename 01/14/12 0450 01/13/12 1516  WBC 19.6* 22.0*  HGB 8.7* 9.0*  HCT 23.9* 24.7*  PLT 684* 679*   BMET  Basename 01/14/12 0450 01/12/12 0519  NA 135 135  K 3.8 4.2  CL 101 101  CO2 25 22  GLUCOSE 90 78  BUN 21 21  CREATININE 1.44* 1.11  CALCIUM 8.1* 8.2*   PT/INR No results found for this basename: LABPROT:2,INR:2 in the last 72 hours ABG No results found for this basename: PHART:2,PCO2:2,PO2:2,HCO3:2 in the last 72 hours  Studies/Results: Dg Chest Port 1 View  01/14/2012  *RADIOLOGY REPORT*  Clinical Data: Follow-up evaluation for empyema.  PORTABLE CHEST - 1 VIEW  Comparison: Chest x-ray 01/13/2012.  Findings: There is a right-sided chest tube in place with tip and sideport projecting over the lower right hemithorax.  There continues to be a moderate right-sided pleural effusion which has decreased slightly compared to yesterday's examination.  Extensive opacities in the right mid and lower lung may represent areas of atelectasis and/or air space consolidation.  No definite left pleural effusion.  Left lung appears clear.  Heart size is normal. Mediastinal contours are unremarkable.  IMPRESSION: 1.  Slight interval decrease in size of moderate  right-sided pleural effusion.  Otherwise, the radiographic appearance of the chest is essentially unchanged compared to yesterday's examination, as detailed above.  Original Report Authenticated By: Florencia Reasons, M.D.   Dg Chest Port 1 View  01/13/2012  *RADIOLOGY REPORT*  Clinical Data: Empyema  PORTABLE CHEST - 1 VIEW  Comparison:   the previous day's study  Findings: The right chest tube remains at the lung base.  There is been partial resolution of the loculated right basilar pleural collection.  Persistent atelectasis/infiltrate at the right lung base.  Minimal linear scarring or atelectasis at the left lung base.  Heart size remains normal. No pneumothorax.  IMPRESSION:  1.  Partial evacuation of right pleural collection post chest tube placement.  Original Report Authenticated By: Osa Craver, M.D.    Anti-infectives: Anti-infectives     Start     Dose/Rate Route Frequency Ordered Stop   01/08/12 1300   vancomycin (VANCOCIN) IVPB 1000 mg/200 mL premix  Status:  Discontinued        1,000 mg 200 mL/hr over 60 Minutes Intravenous Every 12 hours 01/08/12 0856 01/09/12 1143   01/07/12 1300   vancomycin (VANCOCIN) 750 mg in sodium chloride 0.9 % 150 mL IVPB  Status:  Discontinued        750 mg 150 mL/hr over 60 Minutes Intravenous Every 12 hours 01/07/12 1235 01/08/12 0855   01/07/12 1230   piperacillin-tazobactam (ZOSYN) IVPB 3.375 g  3.375 g 12.5 mL/hr over 240 Minutes Intravenous Every 8 hours 01/07/12 1201     01/07/12 1200   piperacillin-tazobactam (ZOSYN) IVPB 3.375 g  Status:  Discontinued        3.375 g 12.5 mL/hr over 240 Minutes Intravenous 4 times per day 01/07/12 1158 01/07/12 1200          Assessment/Plan: s/p Procedure(s) (LRB): VIDEO ASSISTED THORACOSCOPY (VATS)/EMPYEMA (Right)  Rt chest tube drain placed 6/25 Doing well Still has significant output No air leak Cont to monitor  Authur Cubit A 01/14/2012

## 2012-01-14 NOTE — Evaluation (Signed)
Physical Therapy Evaluation Patient Details Name: Bob Wilson MRN: [id number] DOB: [birth year] Today's Date: 01/14/2012 Time: 1050-1120 PT Time Calculation (min): 30 min  PT Assessment / Plan / Recommendation Clinical Impression  Pt admitted for emypema and is s/p thoacentesis with french drain. Pt presents with decreased activity tolerance and has not been OOB in a week, per pt. Pt will benefit from skilled PT to address below impairments and reduce falls risk and caregiver burden at next venue of care. Pt hoping to go home, but discussed recommendation for SNF.     PT Assessment  Patient needs continued PT services    Follow Up Recommendations  Skilled nursing facility;Supervision/Assistance - 24 hour    Barriers to Discharge Inaccessible home environment;Other (comment) (High level of assist needed)      Equipment Recommendations  Defer to next venue    Recommendations for Other Services OT consult   Frequency Min 3X/week    Precautions / Restrictions Precautions Precautions: Fall Precaution Comments: Very painful/gouty L knee Restrictions Weight Bearing Restrictions: No   Pertinent Vitals/Pain No complaints except during mobility      Mobility  Bed Mobility Bed Mobility: Supine to Sit Supine to Sit: 1: +2 Total assist;HOB flat;With rails Supine to Sit: Patient Percentage: 50% Details for Bed Mobility Assistance: Cues for hand placement, technique, and assist needed for LLE lifting and trunk lifting.  Transfers Transfers: Sit to Stand;Stand to Sit Sit to Stand: 1: +2 Total assist;From elevated surface;With upper extremity assist;From bed Sit to Stand: Patient Percentage: 50% Stand to Sit: 3: Mod assist;With upper extremity assist;With armrests;To chair/3-in-1 Details for Transfer Assistance: Pt needing lifting assist, increased time, and steadying assist once up on feet due to prolonged perior in bed. Careful hand placement due to chest tube on R. Pt unable to assist  much with LLE due to pain in knee. Stand>sit pt with decreased control and increased knee pain, cued pt to extend knee next time Ambulation/Gait Ambulation/Gait Assistance: 1: +2 Total assist Ambulation/Gait: Patient Percentage: 60% Ambulation Distance (Feet): 4 Feet Assistive device: Rolling walker Ambulation/Gait Assistance Details: Assist for steadying pt due to unable to weight-bear much or fully extend L knee. Assist also needed for RW management and steadying balance to prevent posterior fall. Pt respons well with cues for tightening L knee in stance and to increase UE weight-bearing to compensate.  Gait Pattern: Step-to pattern;Decreased stance time - left;Shuffle;Trunk flexed General Gait Details: Short walk turning steps bed>chair. Distance limited by pain and fatigue. Pt wiped out once in chair Stairs: No Wheelchair Mobility Wheelchair Mobility: No         PT Diagnosis: Difficulty walking;Generalized weakness  PT Problem List: Decreased strength;Decreased range of motion;Decreased activity tolerance;Decreased balance;Decreased mobility;Decreased knowledge of use of DME;Decreased safety awareness;Pain PT Treatment Interventions: DME instruction;Gait training;Stair training;Functional mobility training;Therapeutic activities;Therapeutic exercise;Balance training;Neuromuscular re-education;Patient/family education;Wheelchair mobility training   PT Goals Acute Rehab PT Goals PT Goal Formulation: With patient Time For Goal Achievement: 01/28/12 Potential to Achieve Goals: Fair Pt will go Supine/Side to Sit: with HOB 0 degrees;with supervision PT Goal: Supine/Side to Sit - Progress: Goal set today Pt will go Sit to Supine/Side: with supervision;with HOB 0 degrees PT Goal: Sit to Supine/Side - Progress: Goal set today Pt will go Sit to Stand: with min assist;with upper extremity assist PT Goal: Sit to Stand - Progress: Goal set today Pt will go Stand to Sit: with min assist;with  upper extremity assist PT Goal: Stand to Sit - Progress: Goal set today  Pt will Transfer Bed to Chair/Chair to Bed: with min assist PT Transfer Goal: Bed to Chair/Chair to Bed - Progress: Goal set today Pt will Ambulate: 1 - 15 feet;with min assist;with rolling walker PT Goal: Ambulate - Progress: Goal set today Pt will Go Up / Down Stairs: 3-5 stairs;with min assist;with least restrictive assistive device;with rail(s) PT Goal: Up/Down Stairs - Progress: Goal set today  Visit Information  Last PT Received On: 01/14/12 Assistance Needed: +2    Subjective Data  Subjective: I haven't been out of this bed in a week Patient Stated Goal: Go home and get this knee moving    Prior Functioning  Home Living Lives With: Spouse Available Help at Discharge: Available 24 hours/day;Family Type of Home: House Home Access: Stairs to enter Secretary/administrator of Steps: 3 Entrance Stairs-Rails: Left Home Layout: One level Home Adaptive Equipment: Wheelchair - [employer] - rolling;Straight cane Additional Comments: Primarily used cane until last week, then RW Prior Function Level of Independence: Independent with assistive device(s) Able to Take Stairs?: Yes Comments: 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, thus declining transfusion Communication Communication: No difficulties    Cognition  Overall Cognitive Status: Appears within functional limits for tasks assessed/performed Arousal/Alertness: Awake/alert Orientation Level: Appears intact for tasks assessed Behavior During Session: [facility] for tasks performed    Extremity/Trunk Assessment Right Lower Extremity Assessment RLE ROM/Strength/Tone: [facility] for tasks assessed Left Lower Extremity Assessment LLE ROM/Strength/Tone: Deficits;Unable to fully assess;Due to pain LLE ROM/Strength/Tone Deficits: <3/5 strength due to pain, ankle WFL.  Trunk Assessment Trunk Assessment: Kyphotic   Balance Balance Balance Assessed: Yes Static Sitting Balance Static  Sitting - Balance Support: Bilateral upper extremity supported;Feet supported Static Sitting - Level of Assistance: 5: Stand by assistance Static Sitting - Comment/# of Minutes: Sat EOB x5 min to accommodate being in upright position Static Standing Balance Static Standing - Balance Support: Bilateral upper extremity supported Static Standing - Level of Assistance: 3: Mod assist Static Standing - Comment/# of Minutes: Stood x50min prior to walk to chair. Pt unable to fully extend L knee. Cues for posture. Mod assist to prevent posterior lean  End of Session PT - End of Session Equipment Utilized During Treatment: Gait belt Activity Tolerance: Patient limited by pain;Patient limited by fatigue Patient left: in chair;with call bell/phone within reach;with family/visitor present Nurse Communication: Mobility status  GP     Virl Cagey, [place] [phone]  01/14/2012, 12:11 PM

## 2012-01-14 NOTE — Progress Notes (Signed)
RNCM confirms plans for d/c to home- no CSW needs- will sign off- please reconsult if needs arise. Reece Levy, MSW, Theresia Majors [phone]

## 2012-01-14 NOTE — Progress Notes (Signed)
PULMONARY/CCM CONSULT NOTE  Requesting MD/Service: Burney/TCTS Date of admission: 6/20  Date of consult: 6/20 Reason for consultation: peri-op med mgmt  Pt Profile:  79 yobm initially seen in consultation on 6/14 by Dr Maple Hudson for eval of L lung nodule and large R effusion. An US guided thoracentesis was performed by IR on 6/17 and 450 cc of foul smelling cloudy fluid consistent with empyema was removed > admitted 6/20 by TCTS service in preparation for VATS drainage of R pleural space planned for 6/21   EVENTS 6/24  IR plans pig tail on 6/25. Due to drop in hgb Dr Edwyna Shell deferred VATS 6/25: s/p pigtail IR and 1200cc pus drained out   SUBJECTIVE/OVERNIGHT/INTERVAL HX ? chset tube drainage overnight - not charted  Having some pain and getting fentanyl No air leak  Filed Vitals:   01/13/12 0549 01/13/12 1442 01/13/12 2023 01/14/12 0432  BP: 138/78 142/80 128/73 146/80  Pulse: 85 76 79 68  Temp: 98.3 F (36.8 C) 97 F (36.1 C) 97.8 F (36.6 C) 98.4 F (36.9 C)  TempSrc: Oral Oral Oral Oral  Resp: 20 18 18 16   Height:      Weight: 177 lb 7.5 oz (80.5 kg)   175 lb 0.7 oz (79.4 kg)  SpO2: 97% 99% 97% 98%    EXAM:  Gen: WDWN in NAD HEENT: very poor dentition Neck:  No JVD, no LAN Chest Rt chest tube Lungs: dull to percussion with bronchial BS approx 1/2 up on R. Clear on L Cardiovascular: RRR s M Abdomen: NABS, soft, NT Ext: 1-2 symmetric edema Neuro: no focal deficits . Sleeping due to fentanyl for pain  DATA:  Ct Guided Abscess Drain  01/12/2012  *RADIOLOGY REPORT*  Clinical Data/Indication: RIGHT EMPYEMA  CT GUIDED ABCESS DRAINAGE WITH CATHETER  Sedation: Versed two mg, Fentanyl 50 mg.  Total Moderate Sedation Time: 15 minutes.  Procedure: The procedure, risks, benefits, and alternatives were explained to the patient. Questions regarding the procedure were encouraged and answered. The patient understands and consents to the procedure.  The right flank was prepped with  betadine in a sterile fashion, and a sterile drape was applied covering the operative field. A sterile gown and sterile gloves were used for the procedure.  Under CT guidance, an 18 gauge needle was inserted into the right empyema and removed over an Amplatz.  A 16-French Thal-Quik drain was inserted.  Frank pus was aspirated and sent for culture.  It was sewn in place and attached to 20 cm water suction.  Findings: Imaging demonstrates placement of a 16-French right chest tube.  Complications: None.  IMPRESSION: Successful 16-French right thoracostomy for empyema.  Original Report Authenticated By: Donavan Burnet, M.D.   Dg Chest Port 1 View  01/13/2012  *RADIOLOGY REPORT*  Clinical Data: Empyema  PORTABLE CHEST - 1 VIEW  Comparison:   the previous day's study  Findings: The right chest tube remains at the lung base.  There is been partial resolution of the loculated right basilar pleural collection.  Persistent atelectasis/infiltrate at the right lung base.  Minimal linear scarring or atelectasis at the left lung base.  Heart size remains normal. No pneumothorax.  IMPRESSION:  1.  Partial evacuation of right pleural collection post chest tube placement.  Original Report Authenticated By: Osa Craver, M.D.      Lab 01/14/12 0450 01/12/12 0519 01/11/12 0505  NA 135 135 134*  K 3.8 4.2 5.3*  CL 101 101 101  CO2 25 22  24  BUN 21 21 20   CREATININE 1.44* 1.11 1.17  GLUCOSE 90 78 76    Lab 01/14/12 0450 01/13/12 1516 01/11/12 0505  HGB 8.7* 9.0* 9.5*  HCT 23.9* 24.7* 25.9*  WBC 19.6* 22.0* 31.4*  PLT 684* 679* 715*     EKG: NSR, LAD, RBBB, no ischemia    IMPRESSION/RECS:    Empyema (01/07/2012) Results for orders placed during the hospital encounter of 01/07/12  MRSA PCR SCREENING     Status: Normal   Collection Time   01/07/12 10:54 PM      Component Value Range Status Comment   MRSA by PCR NEGATIVE  NEGATIVE Final   CULTURE, ROUTINE-ABSCESS     Status: Normal (Preliminary  result)   Collection Time   01/12/12 10:08 AM      Component Value Range Status Comment   Specimen Description ABSCESS RIGHT LUNG   Final    Special Requests NONE   Final    Gram Stain     Final    Value: ABUNDANT WBC PRESENT,BOTH PMN AND MONONUCLEAR     NO SQUAMOUS EPITHELIAL CELLS SEEN     NO ORGANISMS SEEN   Culture NO GROWTH 2 DAYS   Final    Report Status PENDING   Incomplete    Anti-infectives     Start     Dose/Rate Route Frequency Ordered Stop   01/08/12 1300   vancomycin (VANCOCIN) IVPB 1000 mg/200 mL premix  Status:  Discontinued        1,000 mg 200 mL/hr over 60 Minutes Intravenous Every 12 hours 01/08/12 0856 01/09/12 1143   01/07/12 1300   vancomycin (VANCOCIN) 750 mg in sodium chloride 0.9 % 150 mL IVPB  Status:  Discontinued        750 mg 150 mL/hr over 60 Minutes Intravenous Every 12 hours 01/07/12 1235 01/08/12 0855   01/07/12 1230  piperacillin-tazobactam (ZOSYN) IVPB 3.375 g       3.375 g 12.5 mL/hr over 240 Minutes Intravenous Every 8 hours 01/07/12 1201     01/07/12 1200   piperacillin-tazobactam (ZOSYN) IVPB 3.375 g  Status:  Discontinued        3.375 g 12.5 mL/hr over 240 Minutes Intravenous 4 times per day 01/07/12 1158 01/07/12 1200            Assessment: description of fluid (foul smelling) suggests an anaerobic infection likely related to poor dentition and aspiration (noting history of excessive alcohol use) with culture 6/17 confirmatory this is microaerophic strep > Zoysn adequate started 6/20. On 6/24: VATS canceled due to surgical risk. On 6/25:  1.2L pus draining post chest tube 6/27 drainage from Left chest tube <25cc x 24 hrs.   Plan:   - abx per ID-zosyn - D/w Dr Edwyna Shell 6/26, hold off on chest tubes or streptokinase -change to mini express -no surgery planned -6/27 pa and lat c x r for am ordered.  add IS for pulmonary toilet.   Alcohol abuse (01/07/2012)   Assessment: No prior history of DTs   Plan: PRN Lorazepam ordered. Monitor  closely post op. Consider CIWA protocol if indicated   L lung nodule (01/07/2012)   Assessment: appearance worrisome for malignancy. Needs eval after the more pressing issue of empyema is addressed   Plan: f/u with Dr Maple Hudson post hospitalization  Anemia of chronic disease -- Bedelia Person witness; will not accept prbc  Lab 01/14/12 0450 01/13/12 1516 01/11/12 0505 01/09/12 0700 01/07/12 1246  HGB 8.7* 9.0* 9.5* 11.2* 10.2*  No obvious bleed on 6/25. Agree with Heme that likely reflects acute illness and chronic illnesss  PLAN - no transfusion ever for religious reasons  - aranesp   -avoid blood draws and therefore conserve blood  - dc daily lab draws (PCCM MD approval needed for lab draws)  - choose drugs that do not need serum monitoring if possible  GLOBAL   Family updated at bedside  DENTITION  - poor; needs dental consult at some point  [facility] ACNP Adolph Pollack PCCM Pager [phone] till 3 pm If no answer page [phone] 01/14/2012, 8:22 AM    01/14/2012 8:21 AM  Independently examined pt, evaluated data & formulated above care plan with NP Afebrile, WC decreasing, chest tube per TCTS May need SNF vs HHPT  Krew Hortman V.

## 2012-01-15 ENCOUNTER — Inpatient Hospital Stay (HOSPITAL_COMMUNITY): Payer: Medicare Other

## 2012-01-15 DIAGNOSIS — R5381 Other malaise: Secondary | ICD-10-CM

## 2012-01-15 DIAGNOSIS — J869 Pyothorax without fistula: Secondary | ICD-10-CM

## 2012-01-15 LAB — CULTURE, ROUTINE-ABSCESS: Culture: NO GROWTH

## 2012-01-15 MED ORDER — METRONIDAZOLE 500 MG PO TABS
500.0000 mg | ORAL_TABLET | Freq: Three times a day (TID) | ORAL | Status: DC
  Filled 2012-01-15 (×3): qty 1

## 2012-01-15 MED ORDER — DEXTROSE 5 % IV SOLN
1.0000 g | INTRAVENOUS | Status: DC
  Administered 2012-01-15: 1 g via INTRAVENOUS
  Filled 2012-01-15: qty 10

## 2012-01-15 MED ORDER — BACITRACIN ZINC 500 UNIT/GM EX OINT
TOPICAL_OINTMENT | CUTANEOUS | Status: AC
  Filled 2012-01-15: qty 15

## 2012-01-15 MED ORDER — DIPHENHYDRAMINE HCL 25 MG PO CAPS
25.0000 mg | ORAL_CAPSULE | ORAL | Status: DC | PRN
  Administered 2012-01-15: 25 mg via ORAL
  Filled 2012-01-15: qty 1

## 2012-01-15 MED ORDER — PIPERACILLIN-TAZOBACTAM 3.375 G IVPB
3.3750 g | Freq: Three times a day (TID) | INTRAVENOUS | Status: DC
  Administered 2012-01-15 – 2012-01-19 (×12): 3.375 g via INTRAVENOUS
  Filled 2012-01-15 (×16): qty 50

## 2012-01-15 NOTE — Progress Notes (Signed)
I spoke with Horticulturist, commercial. We will follow his progress over the weekend and follow up Monday to determine if patient can be approved for an inpt rehab stay. Please call [phone] with questions.

## 2012-01-15 NOTE — Consult Note (Signed)
Physical Medicine and Rehabilitation Consult Reason for Consult: Deconditioning/empyema Referring Physician: Critical care   HPI: Bob Wilson is a 78 y.o. right-handed male with history of alcohol dependence who is also a Jehovah witness admitted 01/04/2012 with several episodes of syncope and fall sustaining rib fractures as well as scalp abrasions complaining of chills and diaphoresis over the last 2-3 weeks. 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 Underwent right chest tube placement for empyema per Dr. Edwyna Shell. Hospital course complicated by a large right effusion with thoracentesis 01/04/2012 yielding 450 cc of bowel smelling cloudy fluid. Chest tube currently remains for additional 4-6 more weeks and workup ongoing for a left lung nodule to be addressed after empyema resolved. Infectious disease is involved presently on ceftriaxone as well as metronidazole and currently plans for additional 2-3 weeks of antibiotics. Patient developed gout placed on colchicine which was later discontinued due to  loose stools . Physical therapy evaluation completed 6/27/ 13. M.D. has requested physical medicine rehabilitation consult to consider inpatient rehabilitation services due to deconditioning   Review of Systems  Respiratory: Positive for shortness of breath.   Musculoskeletal: Positive for myalgias, joint pain and falls.  Neurological:       Syncope  All other systems reviewed and are negative.   Past Medical History  Diagnosis Date  . Anemia     NOS  . Arthritis   . Gout   . Hyperlipidemia   . Hypertension   . GERD (gastroesophageal reflux disease)   . Elevated PSA     multiple times, refused urology eval [facility] notes)  . ETOH abuse   . Medically noncompliant     noncompliant with follow ups, labs.  . Alcoholism 06/25/2011    Past Surgical History  Procedure Date  . Lipoma excision    History reviewed. No pertinent family history. Social History:  reports that he quit smoking about 37 years ago. His smoking use included Cigarettes. He has quit using smokeless tobacco. He reports that he drinks alcohol. He reports that he does not use illicit drugs. Allergies:  Allergies  Allergen Reactions  . Ace Inhibitors Other (See Comments)    REACTION: Angioedema  . Lisinopril Swelling and Other (See Comments)    REACTION: Facial Swelling   Medications Prior to Admission  Medication Sig Dispense Refill  . colchicine 0.6 MG tablet Take 0.6 mg by mouth 2 (two) times daily.      . furosemide (LASIX) 20 MG tablet Take 20 mg by mouth daily.      Marland Kitchen HYDROcodone-acetaminophen (VICODIN) 5-500 MG per tablet Take 1 tablet by mouth every 8 (eight) hours as needed. For pain      . ibuprofen (ADVIL,MOTRIN) 200 MG tablet Take 400-600 mg by mouth every 6 (six) hours as needed. For pain        Home: Home Living Lives With: Spouse Available Help at Discharge: Available 24 hours/day;Family Type of Home: House Home Access: Stairs to enter Secretary/administrator of Steps: 3 Entrance Stairs-Rails: Left Home Layout: One level Home Adaptive Equipment: Wheelchair - manual;Walker - rolling;Straight cane Additional Comments: Primarily used cane until last week, then RW  Functional History: Prior Function Able to Take Stairs?: Yes Comments: 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, thus declining transfusion Functional Status:  Mobility: Bed Mobility Bed Mobility: Supine to Sit Supine to Sit: 1: +2 Total assist;HOB flat;With rails  Supine to Sit: Patient Percentage: 50% Transfers Transfers: Sit to Stand;Stand to Sit Sit to Stand: 1: +2 Total assist;From elevated surface;With upper extremity assist;From bed Sit to Stand: Patient Percentage: 50% Stand to Sit: 3: Mod assist;With upper extremity assist;With armrests;To  chair/3-in-1 Ambulation/Gait Ambulation/Gait Assistance: 1: +2 Total assist Ambulation/Gait: Patient Percentage: 60% Ambulation Distance (Feet): 4 Feet Assistive device: Rolling walker Ambulation/Gait Assistance Details: Assist for steadying pt due to unable to weight-bear much or fully extend L knee. Assist also needed for RW management and steadying balance to prevent posterior fall. Pt respons well with cues for tightening L knee in stance and to increase UE weight-bearing to compensate.  Gait Pattern: Step-to pattern;Decreased stance time - left;Shuffle;Trunk flexed General Gait Details: Short walk turning steps bed>chair. Distance limited by pain and fatigue. Pt wiped out once in chair Stairs: No Wheelchair Mobility Wheelchair Mobility: No  ADL:    Cognition: Cognition Arousal/Alertness: Awake/alert Orientation Level: Oriented X4 Cognition Overall Cognitive Status: Appears within functional limits for tasks assessed/performed Arousal/Alertness: Awake/alert Orientation Level: Appears intact for tasks assessed Behavior During Session: [facility] for tasks performed  Blood pressure 151/85, pulse 72, temperature 98.3 F (36.8 C), temperature source Oral, resp. rate 18, height 6\' 3"  (1.905 m), weight 79.8 kg (175 lb 14.8 oz), SpO2 97.00%. Physical Exam  Constitutional: He is oriented to person, place, and time. He appears well-developed and well-nourished.  HENT:  Head: Normocephalic.  Eyes: Conjunctivae and EOM are normal. Pupils are equal, round, and reactive to light.  Neck: Normal range of motion. Neck supple. No thyromegaly present.  Cardiovascular: Normal rate and regular rhythm.   Pulmonary/Chest: Effort normal and breath sounds normal. He has no wheezes.  Abdominal: Bowel sounds are normal. He exhibits no distension. There is no tenderness.  Musculoskeletal: He exhibits edema and tenderness.       Large left knee effusion, with associated warmth and tenderness.   Neurological:  He is alert and oriented to person, place, and time. No cranial nerve deficit or sensory deficit.       Moves all 4's fairly equally, with left leg affected by pain and effusion. Strength grossly 3-4 proximally to 4-5 distally.   Skin: Skin is warm and dry.  Psychiatric: He has a normal mood and affect. His behavior is normal. Judgment and thought content normal.    No results found for this or any previous visit (from the past 24 hour(s)). Dg Chest 2 View  01/15/2012  *RADIOLOGY REPORT*  Clinical Data: Right chest tube.  Rib fractures  CHEST - 2 VIEW  Comparison: 01/14/2012  Findings: Right basilar chest tube is unchanged.  Small right basilar pneumothorax has developed since the prior study.  There remains right pleural effusion and right lower lobe atelectasis.  Left lung is clear.  No heart failure.  IMPRESSION: Small right basilar chest tube with associated right pleural effusion.  Original Report Authenticated By: Camelia Phenes, M.D.   Dg Chest Port 1 View  01/14/2012  *RADIOLOGY REPORT*  Clinical Data: Follow-up evaluation for empyema.  PORTABLE CHEST - 1 VIEW  Comparison: Chest x-ray 01/13/2012.  Findings: There is a right-sided chest tube in place with tip and sideport projecting over the lower right hemithorax.  There continues to be a moderate right-sided pleural effusion which has decreased slightly compared to yesterday's examination.  Extensive opacities in the right mid and lower lung may represent areas of atelectasis and/or air space consolidation.  No definite left pleural effusion.  Left lung appears clear.  Heart size is normal. Mediastinal contours are unremarkable.  IMPRESSION: 1.  Slight interval decrease in size of moderate right-sided pleural effusion.  Otherwise, the radiographic appearance of the chest is essentially unchanged compared to yesterday's examination, as detailed above.  Original Report Authenticated By: Florencia Reasons, M.D.    Assessment/Plan: Diagnosis:  deconditioning related to empyema, gouty arhtritis (left knee), multiple medical 1. Does the need for close, 24 hr/day medical supervision in concert with the patient's rehab needs make it unreasonable for this patient to be served in a less intensive setting? Yes and Potentially 2. Co-Morbidities requiring supervision/potential complications: anemia, hyponatremia, hypokalemia,  3. Due to bladder management, bowel management, safety, skin/wound care, disease management, medication administration, pain management and patient education, does the patient require 24 hr/day rehab nursing? Yes and Potentially 4. Does the patient require coordinated care of a physician, rehab nurse, PT (1-2 hrs/day, 5 days/week) and OT (1-2 hrs/day, 5 days/week) to address physical and functional deficits in the context of the above medical diagnosis(es)? Yes Addressing deficits in the following areas: balance, endurance, locomotion, strength, transferring, bowel/bladder control, bathing, dressing, feeding, grooming, toileting and psychosocial support 5. Can the patient actively participate in an intensive therapy program of at least 3 hrs of therapy per day at least 5 days per week? Yes 6. The potential for patient to make measurable gains while on inpatient rehab is excellent 7. Anticipated functional outcomes upon discharge from inpatient rehab are mod I to supervision with PT, mod I to min assist with OT. 8. Estimated rehab length of stay to reach the above functional goals is: 10-14 daysdays 9. Does the patient have adequate social supports to accommodate these discharge functional goals? Yes 10. Anticipated D/C setting: Home 11. Anticipated post D/C treatments: HH therapy 12. Overall Rehab/Functional Prognosis: excellent  RECOMMENDATIONS: This patient's condition is appropriate for continued rehabilitative care in the following setting: CIR Patient has agreed to participate in recommended program. Yes Note that  insurance prior authorization may be required for reimbursement for recommended care.  Comment: The patient has a significant left knee effusion which is hindering his progress. It is most likely gout as you are probably aware. Probably needs a joint aspiration and perhaps a steroid injection or a course of oral steroids. Rehab Rn to follow up. Thanks   Ivory Broad, MD    01/15/2012

## 2012-01-15 NOTE — Progress Notes (Signed)
CSW met with pt and pt's wife re: CIR vs. SNF. Pt and pt's wife report agreeable to SNF search as b/u plan, but they are hopeful for CIR. CSW initiated SNF search. Will f/u with offers to pt's wife and dtr, Pinky [phone], per pt's request.  FL2 on chart for MD signature.  Dellie Burns, MSW, [place] [phone] (coverage)

## 2012-01-15 NOTE — Progress Notes (Signed)
Physical Therapy Treatment Patient Details Name: Bob Wilson MRN: [id number] DOB: 05-22-1933 Today's Date: 01/15/2012 Time: 0135-0205 PT Time Calculation (min): 30 min  PT Assessment / Plan / Recommendation Comments on Treatment Session  Pt persists with painful L knee, moving very slowly today and needing encouragement to aprticipate and continue. Pt was able to walk ~ 6' with RW today.     Follow Up Recommendations  Skilled nursing facility;Supervision/Assistance - 24 hour    Barriers to Discharge        Equipment Recommendations  Defer to next venue    Recommendations for Other Services    Frequency Min 3X/week   Plan Discharge plan remains appropriate;Frequency remains appropriate    Precautions / Restrictions Precautions Precautions: Fall Precaution Comments: Very painful/gouty L knee Restrictions Weight Bearing Restrictions: No   Pertinent Vitals/Pain Pt with L knee pain, nursing made aware.     Mobility  Bed Mobility Bed Mobility: Rolling Right;Right Sidelying to Sit Rolling Right: 3: Mod assist Right Sidelying to Sit: 3: Mod assist;HOB flat;With rails Details for Bed Mobility Assistance: Cues for hand placement, technique, and assist needed for LLE lifting and trunk lifting.  Transfers Transfers: Sit to Stand;Stand to Sit Sit to Stand: 1: +2 Total assist;From elevated surface;With upper extremity assist;From bed Sit to Stand: Patient Percentage: 70% Stand to Sit: 3: Mod assist;With upper extremity assist;With armrests;To chair/3-in-1 Details for Transfer Assistance: Pt needing cues for hand placement and encouragement to attempt stand. Lifting assist needed, but steady once up. Pt with decreased control of descent with cues for hand placment and to extend LLE for comfort.  Ambulation/Gait Ambulation/Gait Assistance: 1: +1 Total assist (1 for equipment/follow with chair) Ambulation/Gait: Patient Percentage: 70% Ambulation Distance (Feet): 6 Feet Assistive  device: Rolling walker Ambulation/Gait Assistance Details: Assist needed for RW advancement, L knee blocking in stance for precaution, L LE advancing and cues for pressing through UEs to unweight LLE in stance/R swing. Encouragement needed to continue, but overall did well. Chair to follow.  Gait Pattern: Step-to pattern;Decreased stance time - left;Shuffle;Trunk flexed General Gait Details: Walk towards door with chair follow. Again, pt fatigued following walk Stairs: No Wheelchair Mobility Wheelchair Mobility: No         PT Diagnosis:    PT Problem List:   PT Treatment Interventions:     PT Goals Acute Rehab PT Goals PT Goal Formulation: With patient Time For Goal Achievement: 01/28/12 Pt will go Supine/Side to Sit: with HOB 0 degrees;with supervision PT Goal: Supine/Side to Sit - Progress: Progressing toward goal Pt will go Sit to Stand: with min assist;with upper extremity assist PT Goal: Sit to Stand - Progress: Progressing toward goal Pt will go Stand to Sit: with min assist;with upper extremity assist PT Goal: Stand to Sit - Progress: Progressing toward goal Pt will Ambulate: 1 - 15 feet;with min assist;with rolling walker PT Goal: Ambulate - Progress: Progressing toward goal  Visit Information  Last PT Received On: 01/15/12 Assistance Needed: +2    Subjective Data  Subjective: I can't do it today. Patient Stated Goal: Get home   Cognition  Overall Cognitive Status: Appears within functional limits for tasks assessed/performed         End of Session PT - End of Session Equipment Utilized During Treatment: Gait belt Activity Tolerance: Patient limited by pain;Patient limited by fatigue Patient left: in chair;with call bell/phone within reach;with family/visitor present Nurse Communication: Mobility status;Need for lift equipment (Advised to use Steady for transfer to [facility])  GP     Virl Cagey, [place] [phone]  01/15/2012, 2:15 PM

## 2012-01-15 NOTE — Progress Notes (Signed)
Pt having hives after receiving a dose of rocephin, hives are primarily on his back, raised round itching.  Rocephin has been stopped, md notified, will admin benedryl per orders and notify ID.

## 2012-01-15 NOTE — Progress Notes (Addendum)
INFECTIOUS DISEASE PROGRESS NOTE  ID: Bob Wilson is a 78 y.o. male with  Empyema s/p pig tail catheter placement on 6/25  Subjective: Having some looses stools, but remains afebrile, less chest discomfort  Abtx: piptazo   Medications:     . darbepoetin  25 mcg Subcutaneous Q7 days  . feeding supplement  237 mL Oral BID BM  . folic acid  1 mg Oral Daily  . iron polysaccharides  150 mg Oral Daily  . multivitamin with minerals  1 tablet Oral Daily  . piperacillin-tazobactam (ZOSYN)  IV  3.375 g Intravenous Q8H  . sodium chloride  3 mL Intravenous Q12H  . thiamine  100 mg Oral Daily  . DISCONTD: colchicine  0.6 mg Oral BID  . DISCONTD: docusate sodium  100 mg Oral BID    Objective: Vital signs in last 24 hours: Temp:  [97.5 F (36.4 C)-98.3 F (36.8 C)] 98.3 F (36.8 C) (06/28 0519) Pulse Rate:  [72-84] 72  (06/28 0519) Resp:  [18] 18  (06/28 0519) BP: (134-151)/(74-85) 151/85 mmHg (06/28 0519) SpO2:  [97 %-98 %] 97 % (06/28 0519) Weight:  [175 lb 14.8 oz (79.8 kg)] 175 lb 14.8 oz (79.8 kg) (06/28 0519) Physical Exam  Constitutional: Bob Wilson is oriented to person, place, and time. Bob Wilson appears well-developed and well-nourished. No distress.  HENT:  Mouth/Throat: Oropharynx is clear and moist. No oropharyngeal exudate.  Cardiovascular: Normal rate, regular rhythm and normal heart sounds. Exam reveals no gallop and no friction rub.  No murmur heard.  Pulmonary/Chest: Effort normal and breath sounds normal. No respiratory distress. Bob Wilson has no wheezes. Right side has very little airmovement, decreased to percussion 2/3rd way up. Right pigtail in place. Abdominal: Soft. Bowel sounds are normal. Bob Wilson exhibits no distension. There is no tenderness.  Lymphadenopathy:  Bob Wilson has no cervical adenopathy.  Neurological: Bob Wilson is alert and oriented to person, place, and time.  Skin: Skin is warm and dry. No rash noted. No erythema.  Psychiatric: Bob Wilson has a normal mood and affect. Bob Wilson behavior is  normal.    Lab Results  Basename 01/14/12 0450 01/13/12 1516  WBC 19.6* 22.0*  HGB 8.7* 9.0*  HCT 23.9* 24.7*  NA 135 --  K 3.8 --  CL 101 --  CO2 25 --  BUN 21 --  CREATININE 1.44* --  GLU -- --    Microbiology: Recent Results (from the past 240 hour(s))  MRSA PCR SCREENING     Status: Normal   Collection Time   01/07/12 10:54 PM      Component Value Range Status Comment   MRSA by PCR NEGATIVE  NEGATIVE Final   CULTURE, ROUTINE-ABSCESS     Status: Normal   Collection Time   01/12/12 10:08 AM      Component Value Range Status Comment   Specimen Description ABSCESS RIGHT LUNG   Final    Special Requests NONE   Final    Gram Stain     Final    Value: ABUNDANT WBC PRESENT,BOTH PMN AND MONONUCLEAR     NO SQUAMOUS EPITHELIAL CELLS SEEN     NO ORGANISMS SEEN   Culture NO GROWTH 3 DAYS   Final    Report Status 01/15/2012 FINAL   Final     Studies/Results: Dg Chest 2 View  01/15/2012  *RADIOLOGY REPORT*  Clinical Data: Right chest tube.  Rib fractures  CHEST - 2 VIEW  Comparison: 01/14/2012  Findings: Right basilar chest tube is unchanged.  Small right  basilar pneumothorax has developed since the prior study.  There remains right pleural effusion and right lower lobe atelectasis.  Left lung is clear.  No heart failure.  IMPRESSION: Small right basilar chest tube with associated right pleural effusion.  Original Report Authenticated By: Camelia Phenes, M.D.   Dg Chest Port 1 View  01/14/2012  *RADIOLOGY REPORT*  Clinical Data: Follow-up evaluation for empyema.  PORTABLE CHEST - 1 VIEW  Comparison: Chest x-ray 01/13/2012.  Findings: There is a right-sided chest tube in place with tip and sideport projecting over the lower right hemithorax.  There continues to be a moderate right-sided pleural effusion which has decreased slightly compared to yesterday's examination.  Extensive opacities in the right mid and lower lung may represent areas of atelectasis and/or air space consolidation.   No definite left pleural effusion.  Left lung appears clear.  Heart size is normal. Mediastinal contours are unremarkable.  IMPRESSION: 1.  Slight interval decrease in size of moderate right-sided pleural effusion.  Otherwise, the radiographic appearance of the chest is essentially unchanged compared to yesterday's examination, as detailed above.  Original Report Authenticated By: Florencia Reasons, M.D.     Assessment/Plan: Empyema = currently on piptazo. cxr shows some improvement but still has significant amount of loculated effusion on xray today. Repeat cultures have not identified other bacteria is isolated other than microaerophilic strep. Currently being treated as polymicrobial.   -> patient received a dose of ceftriaxone and developed diffused hives. Receiving benadryl now. Will switch back to piptazo.  - will likely need extended course of therapy, pending on repeat imaging and drainage.   - would keep on IV antibiotics for an additional 2-3 wks, then can convert to oral antibiotics such as amox/clav once stable.   Leukocytosis = improved slightly since drainage of empyema started. Would recommend repeat cbc prior to discharge to ensure it is trending down.  - will have patient follow up in ID clinic in 2-3 wks. Please continue IV antibiotics until then.  Diarrhea= if still presents despite stopping colchicine and colace, would consider checking for c.diff since patient has been on antibiotics.  Laneya Gasaway Infectious Diseases 01/15/2012, 9:54 AM

## 2012-01-15 NOTE — Progress Notes (Addendum)
5 Days s/p Right chest tube placement; 1 day s/p 16 French chest tube placement  Subjective: Patient with loose stools. He no longer has symptoms of gout and wants the colchicine stopped because of loose stools.  Objective: Vital signs in last 24 hours: Patient Vitals for the past 24 hrs:  BP Temp Temp src Pulse Resp SpO2 Weight  01/15/12 0519 151/85 mmHg 98.3 F (36.8 C) Oral 72  18  97 % 175 lb 14.8 oz (79.8 kg)  01/14/12 2100 139/74 mmHg 97.5 F (36.4 C) Oral 84  18  97 % -  01/14/12 1330 134/83 mmHg 97.6 F (36.4 C) Oral 73  18  98 % -      Intake/Output from previous day: 06/27 0701 - 06/28 0700 In: -  Out: 432 [Urine:350; Stool:2; Chest Tube:80]   Physical Exam:  Cardiovascular: RRR, no murmurs, gallops, or rubs. Pulmonary: Clear on left and  diminished at right base; no rales, wheezes, or rhonchi. Abdomen: Soft, non tender, bowel sounds present. Chest Tube:No air leak  Lab Results: CBC:  Basename 01/14/12 0450 01/13/12 1516  WBC 19.6* 22.0*  HGB 8.7* 9.0*  HCT 23.9* 24.7*  PLT 684* 679*   BMET:   Basename 01/14/12 0450  NA 135  K 3.8  CL 101  CO2 25  GLUCOSE 90  BUN 21  CREATININE 1.44*  CALCIUM 8.1*    PT/INR: No results found for this basename: LABPROT,INR in the last 72 hours ABG:  INR: Will add last result for INR, ABG once components are confirmed Will add last 4 CBG results once components are confirmed  Assessment/Plan:   1.  Pulmonary - s/p placement of 37 French tube for drainage of empyema yesterday.Chest tube has had 80 cc of output last 24 hours.CT is to waterseal and there is no air leak. Continue for now.Will likely change to a mini express, which he will be discharged with.CXR this am shows some improvement in aeration of right base,right pleural effusion and atelectasis, left lung clear.On Zosyn IV. 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 3.Leukocytosis-WBC  decreased to 19,600.Etiology related to empyema. 4.Stop colace and colchicine.  ZIMMERMAN,DONIELLE MPA-C 01/15/2012 Changed to miniexpress 500. CXR improved. Will need chest tube in place for 3-6 weeks. Will arrange followup with one of my partners. I will not be available. Needs 3-6 weeks of IV antibiotics.

## 2012-01-15 NOTE — Progress Notes (Signed)
PULMONARY/CCM CONSULT NOTE  Requesting MD/Service: Burney/TCTS Date of admission: 6/20  Date of consult: 6/20 Reason for consultation: peri-op med mgmt  Pt Profile:  79 yobm initially seen in consultation on 6/14 by Dr Maple Hudson for eval of L lung nodule and large R effusion. An US guided thoracentesis was performed by IR on 6/17 and 450 cc of foul smelling cloudy fluid consistent with empyema was removed > admitted 6/20 by TCTS service in preparation for VATS drainage of R pleural space planned for 6/21   EVENTS 6/24  IR plans pig tail on 6/25. Due to drop in hgb Dr Edwyna Shell deferred VATS 6/25: s/p pigtail IR and 1200cc pus drained out   SUBJECTIVE/OVERNIGHT/INTERVAL HX 80 ccchest tube drainage overnight   Having some pain and getting fentanyl No air leak  Filed Vitals:   01/14/12 0432 01/14/12 1330 01/14/12 2100 01/15/12 0519  BP: 146/80 134/83 139/74 151/85  Pulse: 68 73 84 72  Temp: 98.4 F (36.9 C) 97.6 F (36.4 C) 97.5 F (36.4 C) 98.3 F (36.8 C)  TempSrc: Oral Oral Oral Oral  Resp: 16 18 18 18   Height:      Weight: 175 lb 0.7 oz (79.4 kg)   175 lb 14.8 oz (79.8 kg)  SpO2: 98% 98% 97% 97%    EXAM:  Gen: WDWN in NAD HEENT: very poor dentition Neck:  No JVD, no LAN Chest Rt chest tube Lungs: dull to percussion with bronchial BS approx 1/2 up on R. Clear on L Cardiovascular: RRR s M Abdomen: NABS, soft, NT Ext: 1-2 symmetric edema Neuro: no focal deficits . Sleeping due to fentanyl for pain  DATA:  Dg Chest 2 View  01/15/2012  *RADIOLOGY REPORT*  Clinical Data: Right chest tube.  Rib fractures  CHEST - 2 VIEW  Comparison: 01/14/2012  Findings: Right basilar chest tube is unchanged.  Small right basilar pneumothorax has developed since the prior study.  There remains right pleural effusion and right lower lobe atelectasis.  Left lung is clear.  No heart failure.  IMPRESSION: Small right basilar chest tube with associated right pleural effusion.  Original Report  Authenticated By: Camelia Phenes, M.D.   Dg Chest Port 1 View  01/14/2012  *RADIOLOGY REPORT*  Clinical Data: Follow-up evaluation for empyema.  PORTABLE CHEST - 1 VIEW  Comparison: Chest x-ray 01/13/2012.  Findings: There is a right-sided chest tube in place with tip and sideport projecting over the lower right hemithorax.  There continues to be a moderate right-sided pleural effusion which has decreased slightly compared to yesterday's examination.  Extensive opacities in the right mid and lower lung may represent areas of atelectasis and/or air space consolidation.  No definite left pleural effusion.  Left lung appears clear.  Heart size is normal. Mediastinal contours are unremarkable.  IMPRESSION: 1.  Slight interval decrease in size of moderate right-sided pleural effusion.  Otherwise, the radiographic appearance of the chest is essentially unchanged compared to yesterday's examination, as detailed above.  Original Report Authenticated By: Florencia Reasons, M.D.      Lab 01/14/12 0450 01/12/12 0519 01/11/12 0505  NA 135 135 134*  K 3.8 4.2 5.3*  CL 101 101 101  CO2 25 22 24   BUN 21 21 20   CREATININE 1.44* 1.11 1.17  GLUCOSE 90 78 76    Lab 01/14/12 0450 01/13/12 1516 01/11/12 0505  HGB 8.7* 9.0* 9.5*  HCT 23.9* 24.7* 25.9*  WBC 19.6* 22.0* 31.4*  PLT 684* 679* 715*  EKG: NSR, LAD, RBBB, no ischemia    IMPRESSION/RECS:    Empyema (01/07/2012) Anti-infectives     Start     Dose/Rate Route Frequency Ordered Stop   01/15/12 1400   metroNIDAZOLE (FLAGYL) tablet 500 mg        500 mg Oral 3 times per day 01/15/12 1001     01/15/12 1100   cefTRIAXone (ROCEPHIN) 1 g in dextrose 5 % 50 mL IVPB        1 g 100 mL/hr over 30 Minutes Intravenous Every 24 hours 01/15/12 1001     01/08/12 1300   vancomycin (VANCOCIN) IVPB 1000 mg/200 mL premix  Status:  Discontinued        1,000 mg 200 mL/hr over 60 Minutes Intravenous Every 12 hours 01/08/12 0856 01/09/12 1143   01/07/12 1300    vancomycin (VANCOCIN) 750 mg in sodium chloride 0.9 % 150 mL IVPB  Status:  Discontinued        750 mg 150 mL/hr over 60 Minutes Intravenous Every 12 hours 01/07/12 1235 01/08/12 0855   01/07/12 1230   piperacillin-tazobactam (ZOSYN) IVPB 3.375 g  Status:  Discontinued        3.375 g 12.5 mL/hr over 240 Minutes Intravenous Every 8 hours 01/07/12 1201 01/15/12 1001   01/07/12 1200   piperacillin-tazobactam (ZOSYN) IVPB 3.375 g  Status:  Discontinued        3.375 g 12.5 mL/hr over 240 Minutes Intravenous 4 times per day 01/07/12 1158 01/07/12 1200            Assessment: description of fluid (foul smelling) suggests an anaerobic infection likely related to poor dentition and aspiration (noting history of excessive alcohol use) with culture 6/17 confirmatory this is microaerophic strep > Zoysn adequate started 6/20. On 6/24: VATS canceled due to surgical risk. On 6/25:  1.2L pus draining post chest tube 6/27 drainage from Left chest tube <25cc x 24 hrs.   Plan:   - abx per ID- ceftx / flagyl - D/w Dr Edwyna Shell 6/26, changed to mini express  add IS for pulmonary toilet.  -will likely need PICC line if he goes to SNF for prolonged abx    Alcohol abuse (01/07/2012)   Assessment: No prior history of DTs   Plan: PRN Lorazepam ordered.    L lung nodule (01/07/2012)   Assessment: appearance worrisome for malignancy. Needs eval after the more pressing issue of empyema is addressed   Plan: f/u with Dr Maple Hudson post hospitalization  Anemia of chronic disease -- Bedelia Person witness; will not accept prbc  Lab 01/14/12 0450 01/13/12 1516 01/11/12 0505 01/09/12 0700  HGB 8.7* 9.0* 9.5* 11.2*   No obvious bleed on 6/25. Agree with Heme that likely reflects acute illness and chronic illnesss  PLAN - no transfusion ever for religious reasons  - aranesp   -avoid blood draws and therefore conserve blood  - dc daily lab draws (PCCM MD approval needed for lab draws) -next cbc for monday - choose drugs  that do not need serum monitoring if possible  Diarrhea - stop colace & colchicine If persists, chk c diff  Gout - XR lt knee , if significant fluid may need tap for relief.  [facility] CIr consult - this would be best option for prolonged abx & rehab If not accepted by CIR, place PICC & Plan for SNF with chest tube and mini vac Chest tube to remain 4-6 weeks and follow up with  CVTS for removal.  DENTITION  - poor;  needs dental consult at some point  [facility] ACNP Adolph Pollack PCCM Pager [phone] till 3 pm If no answer page [phone] 01/15/2012, 10:38 AM   Independently examined pt, evaluated data & formulated above care plan with NP Updated wife  Bob Wilson V.

## 2012-01-16 DIAGNOSIS — K219 Gastro-esophageal reflux disease without esophagitis: Secondary | ICD-10-CM

## 2012-01-16 DIAGNOSIS — M109 Gout, unspecified: Secondary | ICD-10-CM

## 2012-01-16 LAB — SYNOVIAL CELL COUNT + DIFF, W/ CRYSTALS: Lymphocytes-Synovial Fld: 20 % (ref 0–20)

## 2012-01-16 MED ORDER — LOPERAMIDE HCL 2 MG PO CAPS
2.0000 mg | ORAL_CAPSULE | ORAL | Status: DC | PRN
  Administered 2012-01-17: 2 mg via ORAL
  Filled 2012-01-16: qty 1

## 2012-01-16 MED ORDER — LIDOCAINE HCL (PF) 2 % IJ SOLN
4.0000 mL | Freq: Once | INTRAMUSCULAR | Status: AC
  Administered 2012-01-16: 4 mL
  Filled 2012-01-16 (×3): qty 4

## 2012-01-16 MED ORDER — METHYLPREDNISOLONE ACETATE 40 MG/ML IJ SUSP
40.0000 mg | Freq: Once | INTRAMUSCULAR | Status: AC
  Administered 2012-01-16: 40 mg via INTRA_ARTICULAR
  Filled 2012-01-16: qty 1

## 2012-01-16 NOTE — Progress Notes (Signed)
PULMONARY/CCM CONSULT NOTE  Requesting MD/Service: Burney/TCTS Date of admission: 6/20  Date of consult: 6/20 Reason for consultation: peri-op med mgmt  Pt Profile:  79 yobm initially seen in consultation on 6/14 by Dr Maple Hudson for eval of L lung nodule and large R effusion. An US guided thoracentesis was performed by IR on 6/17 and 450 cc of foul smelling cloudy fluid consistent with empyema was removed > admitted 6/20 by TCTS service in preparation for VATS drainage of R pleural space planned for 6/21   EVENTS 6/24  IR plans pig tail on 6/25. Due to drop in hgb Dr Edwyna Shell deferred VATS 6/25: s/p pigtail IR and 1200cc pus drained out   SUBJECTIVE/OVERNIGHT/INTERVAL HX --40 cc chest tube drainage overnight, cloudy yellow, no air leak. --Having some pain and getting hydrocodone po & fentanyl IV --large left knee effusion, warm, painful> will call Ortho to tap... --on Aranesp Q2wks, Fe/MVI/Folic/Thiamine, Hg=8.7 on 6/27 --on Zosyn 3.375Q8h   Filed Vitals:   01/15/12 0519 01/15/12 1410 01/15/12 2001 01/16/12 0404  BP: 151/85 124/74 149/72 144/88  Pulse: 72 94 99 92  Temp: 98.3 F (36.8 C) 98.1 F (36.7 C) 97.9 F (36.6 C) 98.3 F (36.8 C)  TempSrc: Oral Oral Oral Oral  Resp: 18 18 18 18   Height:      Weight: 79.8 kg (175 lb 14.8 oz)   78.1 kg (172 lb 2.9 oz)  SpO2: 97% 95% 95% 100%    EXAM:  Gen: WDWN in NAD HEENT: very poor dentition Neck:  No JVD, no LAN Chest Rt chest tube Lungs: dull to percussion with bronchial BS approx 1/2 up on R. Clear on L Cardiovascular: RRR s M Abdomen: NABS, soft, NT Ext: 1-2 symmetric edema Neuro: no focal deficits . Sleeping due to fentanyl for pain  DATA:  Dg Chest 2 View  01/15/2012  *RADIOLOGY REPORT*  Clinical Data: Right chest tube.  Rib fractures  CHEST - 2 VIEW  Comparison: 01/14/2012  Findings: Right basilar chest tube is unchanged.  Small right basilar pneumothorax has developed since the prior study.  There remains right pleural  effusion and right lower lobe atelectasis.  Left lung is clear.  No heart failure.  IMPRESSION: Small right basilar chest tube with associated right pleural effusion.  Original Report Authenticated By: Camelia Phenes, M.D.   Dg Knee 1-2 Views Left  01/15/2012  *RADIOLOGY REPORT*  Clinical Data: gout.  Swelling  LEFT KNEE - 1-2 VIEW  Comparison: None.  Findings: There is a large suprapatellar joint effusion.  No fractures or subluxations.  No focal bony erosions.  No radiopaque foreign bodies or soft tissue calcifications.  Sharpening tibial spines and marginal spur formation noted.  There is medial compartment narrowing.  IMPRESSION:  1.  Large joint effusion. 2.  Osteoarthritis.  Original Report Authenticated By: Rosealee Albee, M.D.     Lab 01/14/12 0450 01/12/12 0519 01/11/12 0505  NA 135 135 134*  K 3.8 4.2 5.3*  CL 101 101 101  CO2 25 22 24   BUN 21 21 20   CREATININE 1.44* 1.11 1.17  GLUCOSE 90 78 76    Lab 01/14/12 0450 01/13/12 1516 01/11/12 0505  HGB 8.7* 9.0* 9.5*  HCT 23.9* 24.7* 25.9*  WBC 19.6* 22.0* 31.4*  PLT 684* 679* 715*     EKG: NSR, LAD, RBBB, no ischemia    IMPRESSION/RECS:    Empyema (01/07/2012) Anti-infectives     Start     Dose/Rate Route Frequency Ordered Stop  01/15/12 1500  piperacillin-tazobactam (ZOSYN) IVPB 3.375 g       3.375 g 12.5 mL/hr over 240 Minutes Intravenous 3 times per day 01/15/12 1409     01/15/12 1400   metroNIDAZOLE (FLAGYL) tablet 500 mg  Status:  Discontinued        500 mg Oral 3 times per day 01/15/12 1001 01/15/12 1408   01/15/12 1100   cefTRIAXone (ROCEPHIN) 1 g in dextrose 5 % 50 mL IVPB  Status:  Discontinued        1 g 100 mL/hr over 30 Minutes Intravenous Every 24 hours 01/15/12 1001 01/15/12 1408   01/08/12 1300   vancomycin (VANCOCIN) IVPB 1000 mg/200 mL premix  Status:  Discontinued        1,000 mg 200 mL/hr over 60 Minutes Intravenous Every 12 hours 01/08/12 0856 01/09/12 1143   01/07/12 1300   vancomycin  (VANCOCIN) 750 mg in sodium chloride 0.9 % 150 mL IVPB  Status:  Discontinued        750 mg 150 mL/hr over 60 Minutes Intravenous Every 12 hours 01/07/12 1235 01/08/12 0855   01/07/12 1230   piperacillin-tazobactam (ZOSYN) IVPB 3.375 g  Status:  Discontinued        3.375 g 12.5 mL/hr over 240 Minutes Intravenous Every 8 hours 01/07/12 1201 01/15/12 1001   01/07/12 1200   piperacillin-tazobactam (ZOSYN) IVPB 3.375 g  Status:  Discontinued        3.375 g 12.5 mL/hr over 240 Minutes Intravenous 4 times per day 01/07/12 1158 01/07/12 1200            Assessment: description of fluid (foul smelling) suggests an anaerobic infection likely related to poor dentition and aspiration (noting history of excessive alcohol use) with culture 6/17 confirmatory this is microaerophic strep > Zoysn adequate started 6/20. On 6/24: VATS canceled due to surgical risk. On 6/25:  1.2L pus draining post chest tube 6/27 drainage from Left chest tube <25cc x 24 hrs.   Plan:   - initial abx per ID- ceftx / flagyl - D/w Dr Edwyna Shell 6/26, changed to mini express  -add IS for pulmonary toilet. -on ZOSYN 3.375gm IV Q8h  -will likely need PICC line if he goes to SNF for prolonged abx   Alcohol abuse (01/07/2012)   Assessment: No prior history of DTs   Plan: PRN Lorazepam ordered.    L lung nodule (01/07/2012)   Assessment: appearance worrisome for malignancy. Needs eval after the more pressing issue of empyema is addressed   Plan: f/u with Dr Maple Hudson post hospitalization   Anemia of chronic disease -- Bob Wilson witness; will not accept prbc  Lab 01/14/12 0450 01/13/12 1516 01/11/12 0505  HGB 8.7* 9.0* 9.5*  No obvious bleed on 6/25. Agree with Heme that likely reflects acute illness and chronic illnesss PLAN - no transfusion ever for religious reasons  - aranesp Q2wks + Fe, MVI, Folic, Thiamine   -avoid blood draws and therefore conserve blood  - dc daily lab draws (PCCM MD approval needed for lab draws) -next  cbc for monday - choose drugs that do not need serum monitoring if possible   Diarrhea - stop colace & colchicine If persists, chk c diff   Gout - XR lt knee==> large effusion & OA -only Uric acid level on chart was 1/12 =7.6 at [facility] & no mention of crystals on aspirates -He gets freq joint aspirations from [facility] -need Ortho consult for asp & injection> check fluid for uric acid crystals.  [facility] CIR consult - this would be best option for prolonged abx & rehab... Seen by DrSwartz 6/28> awaiting decision Monday... If not accepted by CIR, place PICC & Plan for SNF with chest tube and mini vac Chest tube to remain 4-6 weeks and follow up with  CVTS for removal.   DENTITION  - poor; needs dental consult at some point   [facility]. Kriste Basque, MD 01/16/2012, 7:35 AM Bob Wilson

## 2012-01-16 NOTE — Progress Notes (Addendum)
5 Days s/p Right chest tube placement; 1 day s/p 16 French chest tube placement  Subjective: Patient with left knee pain, which he has had for some time secondary to gout.  Objective: Vital signs in last 24 hours: Patient Vitals for the past 24 hrs:  BP Temp Temp src Pulse Resp SpO2 Weight  01/16/12 0404 144/88 mmHg 98.3 F (36.8 C) Oral 92  18  100 % 172 lb 2.9 oz (78.1 kg)  01/15/12 2001 149/72 mmHg 97.9 F (36.6 C) Oral 99  18  95 % -  01/15/12 1410 124/74 mmHg 98.1 F (36.7 C) Oral 94  18  95 % -     Intake/Output from previous day: 06/28 0701 - 06/29 0700 In: 483 [P.O.:480; I.V.:3] Out: 401 [Urine:400; Stool:1]   Physical Exam:  Cardiovascular: RRR, no murmurs, gallops, or rubs. Pulmonary: Clear on left and  diminished at right base; no rales, wheezes, or rhonchi. Abdomen: Soft, non tender, bowel sounds present. Chest Tube: To mini express  Left knee: Swollen and tender.  Lab Results: CBC:  Basename 01/14/12 0450 01/13/12 1516  WBC 19.6* 22.0*  HGB 8.7* 9.0*  HCT 23.9* 24.7*  PLT 684* 679*   BMET:   Basename 01/14/12 0450  NA 135  K 3.8  CL 101  CO2 25  GLUCOSE 90  BUN 21  CREATININE 1.44*  CALCIUM 8.1*    PT/INR: No results found for this basename: LABPROT,INR in the last 72 hours ABG:  INR: Will add last result for INR, ABG once components are confirmed Will add last 4 CBG results once components are confirmed  Assessment/Plan:   1.  Pulmonary - s/p placement of 39 French tube for drainage of empyema .Chest tube has had 80 cc of output last 24 hours.CT is to a mini express, which he will be discharged with. 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 3.Leukocytosis-WBC decreased to 19,600.Etiology related to empyema.Continue Zosyn.Per Dr. Edwyna Shell, will require 3-6 weeks of antibiotics. 4.L knee swelling-Gout.X ray yesterday showed a large suprapatellar effusion and OA.Will likely  need a tap.Colchicine stopped secondary to loose stools yesterday.  ZIMMERMAN,DONIELLE MPA-C 01/16/2012 patient examined and medical record reviewed,agree with above note. VAN TRIGT III,Damascus Feldpausch 01/16/2012

## 2012-01-16 NOTE — Progress Notes (Signed)
Clinical Social Work Department CLINICAL SOCIAL WORK PLACEMENT NOTE 01/16/2012  Patient:  Bob Wilson, Bob Wilson  Account Number:  [id number] Admit date:  01/07/2012  Clinical Social Worker:  Skip Mayer  Date/time:  01/15/2012 02:40 PM  Clinical Social Work is seeking post-discharge placement for this patient at the following level of care:   SKILLED NURSING   (*CSW will update this form in Epic as items are completed)   01/15/2012  Patient/family provided with [facility] Department of Clinical Social Work's list of facilities offering this level of care within the geographic area requested by the patient (or if unable, by the patient's family).  01/15/2012  Patient/family informed of their freedom to choose among providers that offer the needed level of care, that participate in Medicare, Medicaid or managed care program needed by the patient, have an available bed and are willing to accept the patient.  01/15/2012  Patient/family informed of [facility]' ownership interest in [facility], as well as of the fact that they are under no obligation to receive care at this facility.  PASARR submitted to EDS on 01/15/2012 PASARR number received from EDS on   FL2 transmitted to all facilities in geographic area requested by pt/family on  01/15/2012 FL2 transmitted to all facilities within larger geographic area on   Patient informed that his/her managed care company has contracts with or will negotiate with  certain facilities, including the following:     Patient/family informed of bed offers received:   Patient chooses bed at  Physician recommends and patient chooses bed at    Patient to be transferred to  on   Patient to be transferred to facility by   The following physician request were entered in Epic:   Additional Comments:  Dellie Burns, MSW, [employer] [phone]

## 2012-01-16 NOTE — Consult Note (Signed)
ORTHOPAEDIC CONSULTATION  REQUESTING PHYSICIAN: Kalman Shan, MD  Chief Complaint: Left knee swelling and pain  HPI: Bob Wilson is a 78 y.o. male who complains of  left knee swelling and pain that has been moderate to severe over the past couple of days. He says he has had a long history of left knee swelling and pain, and has had aspiration and injections performed many months ago. Normally at home he is able to ambulate, but because of the swelling currently he is not able to walk. He has not had significant fevers lately, however has had a chest infection with an empyema that has been apparently drained. He has been told that he has a history of gout in this knee.  Past Medical History  Diagnosis Date  . Anemia     NOS  . Arthritis   . Gout   . Hyperlipidemia   . Hypertension   . GERD (gastroesophageal reflux disease)   . Elevated PSA     multiple times, refused urology eval [facility] notes)  . ETOH abuse   . Medically noncompliant     noncompliant with follow ups, labs.  . Alcoholism 06/25/2011   Past Surgical History  Procedure Date  . Lipoma excision    History   Social History  . Marital Status: Married    Spouse Name: N/A    Number of Children: N/A  . Years of Education: N/A   Occupational History  . former Games developer    Social History Main Topics  . Smoking status: Former Smoker    Types: Cigarettes    Quit date: 07/20/1974  . Smokeless tobacco: Former Neurosurgeon   Comment: Remote- quit 45 years ago.  . Alcohol Use: 0.0 oz/week     daily  . Drug Use: No  . Sexually Active: Not Currently   Other Topics Concern  . None   Social History Narrative  . None   History reviewed. No pertinent family history. Allergies  Allergen Reactions  . Ace Inhibitors Other (See Comments)    REACTION: Angioedema  . Lisinopril Swelling and Other (See Comments)    REACTION: Facial Swelling   Prior to Admission medications   Medication Sig Start Date End Date  Taking? Authorizing Provider  colchicine 0.6 MG tablet Take 0.6 mg by mouth 2 (two) times daily.   Yes Historical Provider, MD  furosemide (LASIX) 20 MG tablet Take 20 mg by mouth daily.   Yes Historical Provider, MD  HYDROcodone-acetaminophen (VICODIN) 5-500 MG per tablet Take 1 tablet by mouth every 8 (eight) hours as needed. For pain 12/29/11  Yes Hannah Beat, MD  ibuprofen (ADVIL,MOTRIN) 200 MG tablet Take 400-600 mg by mouth every 6 (six) hours as needed. For pain   Yes Historical Provider, MD   Dg Chest 2 View  01/15/2012  *RADIOLOGY REPORT*  Clinical Data: Right chest tube.  Rib fractures  CHEST - 2 VIEW  Comparison: 01/14/2012  Findings: Right basilar chest tube is unchanged.  Small right basilar pneumothorax has developed since the prior study.  There remains right pleural effusion and right lower lobe atelectasis.  Left lung is clear.  No heart failure.  IMPRESSION: Small right basilar chest tube with associated right pleural effusion.  Original Report Authenticated By: Camelia Phenes, M.D.   Dg Knee 1-2 Views Left  01/15/2012  *RADIOLOGY REPORT*  Clinical Data: gout.  Swelling  LEFT KNEE - 1-2 VIEW  Comparison: None.  Findings: There is a large suprapatellar joint effusion.  No fractures or subluxations.  No focal bony erosions.  No radiopaque foreign bodies or soft tissue calcifications.  Sharpening tibial spines and marginal spur formation noted.  There is medial compartment narrowing.  IMPRESSION:  1.  Large joint effusion. 2.  Osteoarthritis.  Original Report Authenticated By: Rosealee Albee, M.D.    Positive ROS: All other systems have been reviewed and were otherwise negative with the exception of those mentioned in the HPI and as above.  Physical Exam: General: Alert, no acute distress Cardiovascular: No pedal edema Respiratory: No cyanosis, no use of accessory musculature GI: No organomegaly, abdomen is soft and non-tender Skin: No lesions in the area of chief  complaint Neurologic: Sensation intact distally Psychiatric: Patient is competent for consent with normal mood and affect  MUSCULOSKELETAL: His left knee has a very large effusion, probably at least 100 250 mL. There is no significant varus edema. Range of motion is from 10 to 80, however he does this with pain secondary to his effusion.  Assessment: Large left knee effusion, question gout versus arthritic flare versus sepsis.  Principal Problem:  *Empyema Active Problems:  Pre-operative respiratory examination  Alcohol abuse  Hypokalemia  Hyponatremia  Lung nodule   Plan: I discussed the options with him, including aspiration injection versus ongoing observation, he would like to proceed with an aspiration and injection. If indeed this comes back as sepsis, then he'll need arthroscopic irrigation and debridement emergently. If however the aspirate appears like gout, then he may need medical management for gout, which I will defer to Dr. Kriste Basque. If it appears to be osteoarthritic, then we will plan for conservative treatment of knee arthritis.  Thank you for this consultation.  01/16/2012  11:51 AM  PATIENT:  Bob Wilson    PRE-PROCEDURE DIAGNOSIS:  Left knee effusion  POST-OPERATIVE DIAGNOSIS:  Same  PROCEDURE:  Intra-articular injection left knee with aspiration sent for Gram stain, culture, sensitivity, analysis, and crystals.  PROCEDURE DETAILS:  After informed verbal consent was obtained the superolateral portal was prepped with alcohol and 4 cc of Xylocaine and 1 cc of Depo-Medrol was injected after aspirating 80 mL of turbid joint fluid. He tolerated this well and a Band-Aid was placed.     Eulas Post, MD 01/16/2012 11:45 AM

## 2012-01-17 LAB — OCCULT BLOOD X 1 CARD TO LAB, STOOL: Fecal Occult Bld: NEGATIVE

## 2012-01-17 MED ORDER — PANTOPRAZOLE SODIUM 40 MG PO TBEC
40.0000 mg | DELAYED_RELEASE_TABLET | Freq: Two times a day (BID) | ORAL | Status: DC
  Administered 2012-01-17 – 2012-01-19 (×5): 40 mg via ORAL
  Filled 2012-01-17 (×5): qty 1

## 2012-01-17 MED ORDER — PREDNISONE 20 MG PO TABS
20.0000 mg | ORAL_TABLET | Freq: Two times a day (BID) | ORAL | Status: DC
  Administered 2012-01-17 – 2012-01-19 (×5): 20 mg via ORAL
  Filled 2012-01-17 (×7): qty 1

## 2012-01-17 MED ORDER — INDOMETHACIN 25 MG PO CAPS
25.0000 mg | ORAL_CAPSULE | Freq: Two times a day (BID) | ORAL | Status: DC
  Administered 2012-01-17 – 2012-01-19 (×5): 25 mg via ORAL
  Filled 2012-01-17 (×8): qty 1

## 2012-01-17 NOTE — Progress Notes (Signed)
Patient ID: Bob Wilson, male   DOB: 02-23-1933, 78 y.o.   MRN: [id number]     Subjective:  Patient reports pain as mild.  He states that his knee is doing better.  Objective:   VITALS:   Filed Vitals:   01/16/12 0404 01/16/12 1417 01/16/12 2121 01/17/12 0544  BP: 144/88 149/92 137/80 165/63  Pulse: 92 74 76 83  Temp: 98.3 F (36.8 C) 98 F (36.7 C) 98.1 F (36.7 C) 98.4 F (36.9 C)  TempSrc: Oral  Oral Oral  Resp: 18 18 18 18   Height:      Weight: 78.1 kg (172 lb 2.9 oz)     SpO2: 100% 98% 99% 97%    ABD soft Sensation intact distally Dorsiflexion/Plantar flexion intact  LABS  Results for orders placed during the hospital encounter of 01/07/12 (from the past 24 hour(s))  CELL COUNT + DIFF,  W/ CRYST-SYNVL FLD     Status: Abnormal   Collection Time   01/16/12  1:06 PM      Component Value Range   Color, Synovial YELLOW  YELLOW   Appearance-Synovial CLOUDY (*) CLEAR   Crystals, Fluid INTRACELLULAR MONOSODIUM URATE CRYSTALS     WBC, Synovial 82956 (*) 0 - 200 /cu mm   Neutrophil, Synovial 80 (*) 0 - 25 %   Lymphocytes-Synovial Fld 20  0 - 20 %    Dg Knee 1-2 Views Left  01/15/2012  *RADIOLOGY REPORT*  Clinical Data: gout.  Swelling  LEFT KNEE - 1-2 VIEW  Comparison: None.  Findings: There is a large suprapatellar joint effusion.  No fractures or subluxations.  No focal bony erosions.  No radiopaque foreign bodies or soft tissue calcifications.  Sharpening tibial spines and marginal spur formation noted.  There is medial compartment narrowing.  IMPRESSION:  1.  Large joint effusion. 2.  Osteoarthritis.  Original Report Authenticated By: Rosealee Albee, M.D.    Assessment/Plan: 9 Days Post-Op   Principal Problem:  *Empyema Active Problems:  Pre-operative respiratory examination  Alcohol abuse  Hypokalemia  Hyponatremia  Lung nodule   Advance diet Up with therapy Continue plan per medicine  Left knee gout confirmed by aspiration, improved with  injection, recommend medical management per primary team.  Please call with additional questions.  OK to f/u with me if desired as outpatient.  [phone]   Haskel Khan 01/17/2012, 8:57 AM   Teryl Lucy, MD Cell [phone] Pager [phone]

## 2012-01-17 NOTE — Progress Notes (Signed)
5 Days s/p Right chest tube placement; 1 day s/p 16 French chest tube placement  Subjective: Patient with slightly less left knee pain since aspiration yesterday. Still having loose stools.   Objective: Vital signs in last 24 hours: Patient Vitals for the past 24 hrs:  BP Temp Temp src Pulse Resp SpO2  01/17/12 0544 165/63 mmHg 98.4 F (36.9 C) Oral 83  18  97 %  01/16/12 2121 137/80 mmHg 98.1 F (36.7 C) Oral 76  18  99 %  01/16/12 1417 149/92 mmHg 98 F (36.7 C) - 74  18  98 %     Intake/Output from previous day: 06/29 0701 - 06/30 0700 In: 720 [P.O.:720] Out: 1141 [Urine:1100; Stool:1; Chest Tube:40]   Physical Exam:  Cardiovascular: RRR, no murmurs, gallops, or rubs. Pulmonary: Clear on left and  diminished at right base; no rales, wheezes, or rhonchi. Abdomen: Soft, non tender, bowel sounds present. Chest Tube: To mini express  Left knee: Swollen and tender.  Lab Results: CBC: No results found for this basename: WBC:2,HGB:2,HCT:2,PLT:2 in the last 72 hours BMET:  No results found for this basename: NA:2,K:2,CL:2,CO2:2,GLUCOSE:2,BUN:2,CREATININE:2,CALCIUM:2 in the last 72 hours  PT/INR: No results found for this basename: LABPROT,INR in the last 72 hours ABG:  INR: Will add last result for INR, ABG once components are confirmed Will add last 4 CBG results once components are confirmed  Assessment/Plan:   1.  Pulmonary - s/p placement of 39 French tube for drainage of empyema .Chest tube has had 40 cc of output last 24 hours.CT is to a mini express, which he will be discharged with.Check CXR in am. 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 3.Leukocytosis-Last WBC decreased to 19,600.Etiology related to empyema.Continue Zosyn.Per Dr. Edwyna Shell, will require 3-6 weeks of antibiotics. 4.L knee swelling-Gout.X ray yesterday showed a large suprapatellar effusion and OA.s/p aspiration and injection of  Depo Medrol.Colchicine stopped secondary to loose stools.On Indocin. 5.Await C Dif results  Katlin Bortner MPA-C 01/17/2012

## 2012-01-17 NOTE — Progress Notes (Signed)
PULMONARY/CCM CONSULT NOTE  Requesting MD/Service: Burney/TCTS Date of admission: 6/20  Date of consult: 6/20 Reason for consultation: peri-op med mgmt  Pt Profile:  79 yobm initially seen in consultation on 6/14 by Dr Maple Hudson for eval of L lung nodule and large R effusion. An US guided thoracentesis was performed by IR on 6/17 and 450 cc of foul smelling cloudy fluid consistent with empyema was removed > admitted 6/20 by TCTS service in preparation for VATS drainage of R pleural space planned for 6/21   EVENTS 6/24>  IR plans pig tail on 6/25. Due to drop in hgb Dr Edwyna Shell deferred VATS 6/25> s/p pigtail IR and 1200cc pus drained out 6/29> left knee tap by DrLandau, pos urate crystals, cult pending    SUBJECTIVE/OVERNIGHT/INTERVAL HX --40 cc chest tube drainage overnight, cloudy yellow, no air leak. --Having some pain and getting hydrocodone po & fentanyl IV --on Aranesp Q2wks, Fe/MVI/Folic/Thiamine, Hg=8.7 on 6/27 --on Zosyn 3.375Q8h --left knee effusion tapped 6/29 by DrLandau, pos Urate crystals   Filed Vitals:   01/16/12 0404 01/16/12 1417 01/16/12 2121 01/17/12 0544  BP: 144/88 149/92 137/80 165/63  Pulse: 92 74 76 83  Temp: 98.3 F (36.8 C) 98 F (36.7 C) 98.1 F (36.7 C) 98.4 F (36.9 C)  TempSrc: Oral  Oral Oral  Resp: 18 18 18 18   Height:      Weight: 78.1 kg (172 lb 2.9 oz)     SpO2: 100% 98% 99% 97%    EXAM:  Gen: WDWN in NAD HEENT: very poor dentition Neck:  No JVD, no LAN Chest Rt chest tube Lungs: dull to percussion with bronchial BS approx 1/2 up on R. Clear on L Cardiovascular: RRR s M Abdomen: NABS, soft, NT Ext: 1-2 symmetric edema, swollen warm tender left knee Neuro: no focal deficits . Sleeping due to fentanyl for pain   DATA:  Dg Knee 1-2 Views Left  01/15/2012  *RADIOLOGY REPORT*  Clinical Data: gout.  Swelling  LEFT KNEE - 1-2 VIEW  Comparison: None.  Findings: There is a large suprapatellar joint effusion.  No fractures or subluxations.  No  focal bony erosions.  No radiopaque foreign bodies or soft tissue calcifications.  Sharpening tibial spines and marginal spur formation noted.  There is medial compartment narrowing.  IMPRESSION:  1.  Large joint effusion. 2.  Osteoarthritis.  Original Report Authenticated By: Rosealee Albee, M.D.     Lab 01/14/12 0450 01/12/12 0519 01/11/12 0505  NA 135 135 134*  K 3.8 4.2 5.3*  CL 101 101 101  CO2 25 22 24   BUN 21 21 20   CREATININE 1.44* 1.11 1.17  GLUCOSE 90 78 76    Lab 01/14/12 0450 01/13/12 1516 01/11/12 0505  HGB 8.7* 9.0* 9.5*  HCT 23.9* 24.7* 25.9*  WBC 19.6* 22.0* 31.4*  PLT 684* 679* 715*    EKG: NSR, LAD, RBBB, no ischemia   IMPRESSION/RECS:    Empyema (01/07/2012) Anti-infectives     Start     Dose/Rate Route Frequency Ordered Stop   01/15/12 1500  piperacillin-tazobactam (ZOSYN) IVPB 3.375 g       3.375 g 12.5 mL/hr over 240 Minutes Intravenous 3 times per day 01/15/12 1409     01/15/12 1400   metroNIDAZOLE (FLAGYL) tablet 500 mg  Status:  Discontinued        500 mg Oral 3 times per day 01/15/12 1001 01/15/12 1408   01/15/12 1100   cefTRIAXone (ROCEPHIN) 1 g in dextrose 5 % 50  mL IVPB  Status:  Discontinued        1 g 100 mL/hr over 30 Minutes Intravenous Every 24 hours 01/15/12 1001 01/15/12 1408   01/08/12 1300   vancomycin (VANCOCIN) IVPB 1000 mg/200 mL premix  Status:  Discontinued        1,000 mg 200 mL/hr over 60 Minutes Intravenous Every 12 hours 01/08/12 0856 01/09/12 1143   01/07/12 1300   vancomycin (VANCOCIN) 750 mg in sodium chloride 0.9 % 150 mL IVPB  Status:  Discontinued        750 mg 150 mL/hr over 60 Minutes Intravenous Every 12 hours 01/07/12 1235 01/08/12 0855   01/07/12 1230   piperacillin-tazobactam (ZOSYN) IVPB 3.375 g  Status:  Discontinued        3.375 g 12.5 mL/hr over 240 Minutes Intravenous Every 8 hours 01/07/12 1201 01/15/12 1001   01/07/12 1200   piperacillin-tazobactam (ZOSYN) IVPB 3.375 g  Status:  Discontinued          3.375 g 12.5 mL/hr over 240 Minutes Intravenous 4 times per day 01/07/12 1158 01/07/12 1200           Assessment: description of fluid (foul smelling) suggests an anaerobic infection likely related to poor dentition and aspiration (noting history of excessive alcohol use) with culture 6/17 confirmatory this is microaerophic strep > Zoysn adequate started 6/20. On 6/24: VATS canceled due to surgical risk. On 6/25:  1.2L pus draining post chest tube 6/27 drainage from Left chest tube <25cc x 24 hrs.   Plan:   - initial abx per ID- ceftx / flagyl - D/w Dr Edwyna Shell 6/26, changed to mini express  -add IS for pulmonary toilet. -on ZOSYN 3.375gm IV Q8h  -will likely need PICC line if he goes to SNF for prolonged abx   Alcohol abuse (01/07/2012)   Assessment: No prior history of DTs   Plan: PRN Lorazepam ordered.    L lung nodule (01/07/2012)   Assessment: appearance worrisome for malignancy. Needs eval after the more pressing issue of empyema is addressed   Plan: f/u with Dr Maple Hudson post hospitalization   Anemia of chronic disease -- Bedelia Person witness; will not accept prbc  Lab 01/14/12 0450 01/13/12 1516 01/11/12 0505  HGB 8.7* 9.0* 9.5*  No obvious bleed on 6/25. Agree with Heme that likely reflects acute illness and chronic illnesss PLAN - no transfusion ever for religious reasons  - aranesp Q2wks + Fe, MVI, Folic, Thiamine   -avoid blood draws and therefore conserve blood  - dc daily lab draws (PCCM MD approval needed for lab draws) -next cbc for monday - choose drugs that do not need serum monitoring if possible   Diarrhea - stop colace & colchicine If persists, chk c diff   Gout - XR lt knee==> large effusion -only Uric acid level on chart was 1/12 =7.6 at [facility] & no mention of crystals on prev outpt aspirates -He gets freq [facility] aspirations [facility] -need Ortho consult for asp & injection> check fluid for uric acid crystals. - tapped6/29 by Oley Balm & fluid pos Urate  crystals & knee injected -we will add Pred (needs to be tapered ASAP), Indocin (needs to be stopped as soon as practical), cover w/ Protonix, he will need Allopurinol started later in follow up...   [facility] consult - this would be best option for prolonged abx & rehab... Seen by DrSwartz 6/28> awaiting decision Monday... If not accepted by CIR, place PICC & Plan for SNF with chest tube  and mini vac Chest tube to remain 4-6 weeks and follow up with  CVTS for removal.   DENTITION  - poor; needs dental consult at some point   [facility]. Kriste Basque, MD 01/17/2012, 7:31 AM Gonzalo Waymire Judie Petit

## 2012-01-18 ENCOUNTER — Inpatient Hospital Stay (HOSPITAL_COMMUNITY): Payer: Medicare Other

## 2012-01-18 DIAGNOSIS — M1710 Unilateral primary osteoarthritis, unspecified knee: Secondary | ICD-10-CM

## 2012-01-18 LAB — CBC
Hemoglobin: 8.7 g/dL — ABNORMAL LOW (ref 13.0–17.0)
MCH: 32.2 pg (ref 26.0–34.0)
MCHC: 35.8 g/dL (ref 30.0–36.0)
MCV: 90 fL (ref 78.0–100.0)

## 2012-01-18 LAB — RETICULOCYTES: Retic Ct Pct: 3.7 % — ABNORMAL HIGH (ref 0.4–3.1)

## 2012-01-18 NOTE — Progress Notes (Signed)
PULMONARY/CCM CONSULT NOTE  Requesting MD/Service: Burney/TCTS Date of admission: 6/20  Date of consult: 6/20 Reason for consultation: peri-op med mgmt   Pt Profile:  78 y/o bm initially seen in consultation on 6/14 by Dr Maple Hudson for eval of L lung nodule and large R effusion. US guided thoracentesis was performed by IR on 6/17 with 450 cc of foul smelling cloudy fluid consistent with empyema was removed> admitted 6/20 by TCTS service in preparation for VATS drainage of R pleural space planned for 6/21.     EVENTS 6/24 - IR plans pig tail on 6/25. Due to drop in hgb Dr Edwyna Shell deferred VATS 6/25 - s/p pigtail IR and 1200cc pus drained out 6/29 - L knee effusion aspirated 80ml fluid, depo medrol injection  SUBJECTIVE/OVERNIGHT/INTERVAL HX Patient reports he feels much better.  Hopeful to be able to go home, wanting to work with PT.    Filed Vitals:   01/17/12 0544 01/17/12 1700 01/17/12 2144 01/18/12 0428  BP: 165/63 154/89 151/85 152/83  Pulse: 83 89 78 74  Temp: 98.4 F (36.9 C) 98.1 F (36.7 C) 98.3 F (36.8 C) 97.4 F (36.3 C)  TempSrc: Oral Oral Oral Oral  Resp: 18 16 18 18   Height:      Weight:      SpO2: 97% 98% 98% 98%    EXAM:  Gen: WDWN in NAD HEENT: very poor dentition Neck:  No JVD, no LAN Chest:  s1s2 rrr, no m/r/g, Rt chest tube with cloudy yellow/brown drainage Lungs: clear on L, R diminished, resp's even/non-labored Cardiovascular: RRR s M Abdomen: NABS, soft, NT Ext: 1-2 symmetric edema, L knee effusion, no erythema, heat Neuro: no focal deficits .   Radiology  Dg Chest 2 View  01/18/2012  *RADIOLOGY REPORT*  Clinical Data: Right-sided chest tube.  Right sided rib fractures.  CHEST - 2 VIEW  Comparison: 01/15/2012.  Findings: Right basilar thoracostomy tube remains present.  Right pleural fluid and atelectasis remains present with some positional shift compared to prior. Collapse / consolidation of the right middle and right lower lobes present and appears  similar to prior. Development of subsegmental atelectasis along the cardiac apex. Frontal lateral views demonstrate the right basilar pleural fluid is moderate in size.  IMPRESSION: 1.  Stable support apparatus. 2. Positional shift in right pleural fluid. 3. Unchanged right middle and right lower lobe collapse / consolidation.  Original Report Authenticated By: Andreas Newport, M.D.   LABS  Lab 01/14/12 0450 01/12/12 0519  NA 135 135  K 3.8 4.2  CL 101 101  CO2 25 22  BUN 21 21  CREATININE 1.44* 1.11  GLUCOSE 90 78    Lab 01/18/12 0818 01/14/12 0450 01/13/12 1516  HGB 8.7* 8.7* 9.0*  HCT 24.3* 23.9* 24.7*  WBC 14.5* 19.6* 22.0*  PLT 598* 684* 679*      IMPRESSION/RECS:    Empyema (01/07/2012)  Anti-infectives     Start     Dose/Rate Route Frequency Ordered Stop   01/15/12 1500  piperacillin-tazobactam (ZOSYN) IVPB 3.375 g       3.375 g 12.5 mL/hr over 240 Minutes Intravenous 3 times per day 01/15/12 1409     01/15/12 1400   metroNIDAZOLE (FLAGYL) tablet 500 mg  Status:  Discontinued        500 mg Oral 3 times per day 01/15/12 1001 01/15/12 1408   01/15/12 1100   cefTRIAXone (ROCEPHIN) 1 g in dextrose 5 % 50 mL IVPB  Status:  Discontinued  1 g 100 mL/hr over 30 Minutes Intravenous Every 24 hours 01/15/12 1001 01/15/12 1408   01/08/12 1300   vancomycin (VANCOCIN) IVPB 1000 mg/200 mL premix  Status:  Discontinued        1,000 mg 200 mL/hr over 60 Minutes Intravenous Every 12 hours 01/08/12 0856 01/09/12 1143   01/07/12 1300   vancomycin (VANCOCIN) 750 mg in sodium chloride 0.9 % 150 mL IVPB  Status:  Discontinued        750 mg 150 mL/hr over 60 Minutes Intravenous Every 12 hours 01/07/12 1235 01/08/12 0855   01/07/12 1230   piperacillin-tazobactam (ZOSYN) IVPB 3.375 g  Status:  Discontinued        3.375 g 12.5 mL/hr over 240 Minutes Intravenous Every 8 hours 01/07/12 1201 01/15/12 1001   01/07/12 1200   piperacillin-tazobactam (ZOSYN) IVPB 3.375 g  Status:   Discontinued        3.375 g 12.5 mL/hr over 240 Minutes Intravenous 4 times per day 01/07/12 1158 01/07/12 1200          Assessment: description of fluid (foul smelling) suggests an anaerobic infection likely related to poor dentition and aspiration (noting history of excessive alcohol use) with culture 6/17 confirmatory this is microaerophic strep > Zoysn adequate started 6/20. 6/24: VATS canceled due to surgical risk.  6/25:  1.2L pus draining post chest tube 6/27 drainage from Left chest tube <25cc x 24 hrs.    Plan:   -abx as above - D/w Dr Edwyna Shell 6/26, changed to mini express -add IS for pulmonary toilet. -will likely need PICC line if he goes to CIR vs. home for prolonged abx -would prefer alternative to Zosyn, but had hives with ceftx -Chest tube to remain 4-6 weeks and follow up with CVTS for removal -needs earlier appt with TCTS in 10 ds or so   Alcohol abuse (01/07/2012) Assessment: No prior history of DTs Plan:  -PRN Lorazepam     L lung nodule (01/07/2012) Assessment: appearance worrisome for malignancy. Needs eval after the more pressing issue of empyema is addressed Plan:  -f/u with Dr Maple Hudson post hospitalization   Anemia of chronic disease - Jevohah witness  Lab 01/18/12 0818 01/14/12 0450 01/13/12 1516  HGB 8.7* 8.7* 9.0*   No obvious bleed on 6/25. Agree with Heme that likely reflects acute illness and chronic illnesss Plan: -note hx of Jevohah's witness / no PRBC's -aranesp Q2wks + Fe, MVI, Folic, Thiamine -avoid blood draws as able   Diarrhea  Plan: -off colace & colchicine -cdiff negative   Gout - XR lt knee==> large effusion & OA Plan: -only Uric acid level on chart was 1/12 =7.6 at [facility] & no mention of crystals on aspirates -He gets freq [facility] aspirations [facility] -Appreciate Ortho Consult -dc on low dose prednisone + indomethacin  DENTITION -poor, likely contributing factor with pulmonary issues Plan: -needs dental consult at some  point   [facility] consult - this would be best option for prolonged abx & rehab.  Seen by Dr. Riley Kill 6/28> awaiting decision. If not accepted by CIR, place PICC & Plan for SNF with chest tube and mini vac.     Canary Brim, NP-C [facility] Pgr: [phone] or [phone]  Independently examined pt, evaluated data & formulated above care plan with NP   [facility] V.  [phone]

## 2012-01-18 NOTE — Progress Notes (Signed)
Discussed with therapy who saw patient this morning. Patient is supervision to minguard assist and it is recommended home with home health at this time. I met with his wife at bedside and she is in agreement. [phone]

## 2012-01-18 NOTE — Progress Notes (Signed)
Physical Therapy Treatment Patient Details Name: Bob Wilson MRN: [id number] DOB: September 05, 1932 Today's Date: 01/18/2012 Time: 7829-5621 PT Time Calculation (min): 25 min  PT Assessment / Plan / Recommendation Comments on Treatment Session  Patient with empyema, L Lung nodule and R effusion.  Moving much better today.  Wife present and feels comfortable taking patient home at present level.  Wife asking about getting a lift chair and hospital bed.  Will need HHPT f/u.      Follow Up Recommendations  Home health PT;Supervision/Assistance - 24 hour    Barriers to Discharge  None      Equipment Recommendations  Hospital bed (lift chair)    Recommendations for Other Services OT consult  Frequency Min 3X/week   Plan Discharge plan remains appropriate;Frequency remains appropriate    Precautions / Restrictions Precautions Precautions: Fall Precaution Comments: L knee better Restrictions Weight Bearing Restrictions: No   Pertinent Vitals/Pain VSS/no pain    Mobility  Bed Mobility Bed Mobility: Rolling Right;Right Sidelying to Sit;Sitting - Scoot to Edge of Bed Rolling Right: 4: Min guard Right Sidelying to Sit: 4: Min guard;HOB flat;With rails Supine to Sit: Not tested (comment) Sitting - Scoot to Edge of Bed: 4: Min guard Details for Bed Mobility Assistance: cues for hand placement and technique only Transfers Transfers: Sit to Stand;Stand to Sit Sit to Stand: 4: Min guard;With upper extremity assist;From bed;From elevated surface Stand to Sit: 4: Min guard;With upper extremity assist;With armrests;To chair/3-in-1 Details for Transfer Assistance: Wife present.  Wife observed and assisted as well.  Patient needed cues for hand placement.  Steady once up.  PAtient with decr control of descent with cues for hand placement.   Ambulation/Gait Ambulation/Gait Assistance: 4: Min guard Ambulation Distance (Feet): 100 Feet Assistive device: Rolling walker Ambulation/Gait Assistance  Details: Assist needed for sequencing steps only.  Overall patient improving.  Wife assisted with all mobility during treatment and feels comfortable assisting patient to d/c home in next few days.   Gait Pattern: Step-to pattern;Decreased stance time - left;Shuffle;Trunk flexed Gait velocity: Decreased Stairs: No Wheelchair Mobility Wheelchair Mobility: No    PT Goals Acute Rehab PT Goals PT Goal: Supine/Side to Sit - Progress: Progressing toward goal PT Goal: Sit to Supine/Side - Progress: Progressing toward goal PT Goal: Sit to Stand - Progress: Met PT Goal: Stand to Sit - Progress: Met PT Goal: Ambulate - Progress: Met  Visit Information  Last PT Received On: 01/18/12 Assistance Needed: +1    Subjective Data  Subjective: "I can't go outside the room."   Cognition  Overall Cognitive Status: Appears within functional limits for tasks assessed/performed Arousal/Alertness: Awake/alert Orientation Level: Appears intact for tasks assessed Behavior During Session: [facility] for tasks performed    Balance  Static Sitting Balance Static Sitting - Balance Support: No upper extremity supported;Feet supported Static Sitting - Level of Assistance: 6: Modified independent (Device/Increase time) Static Sitting - Comment/# of Minutes: Sat EOB 5 min  Static Standing Balance Static Standing - Balance Support: Bilateral upper extremity supported;During functional activity Static Standing - Level of Assistance: 5: Stand by assistance Static Standing - Comment/# of Minutes: No problems standing.  No posterior lean.    End of Session PT - End of Session Equipment Utilized During Treatment: Gait belt Activity Tolerance: Patient limited by fatigue Patient left: in chair;with call bell/phone within reach;with family/visitor present Nurse Communication: Mobility status        INGOLD,Toriano Aikey 01/18/2012, 12:03 PM  [facility] [phone] (pager)

## 2012-01-18 NOTE — Progress Notes (Signed)
Clinical Social Work-CSW received call from MD re: [facility] possibilities notified CM and per chart review pt will d/c home-No further needs at this time-Please re-consult if needs arise/plan changes- Jodean Lima, [phone]

## 2012-01-18 NOTE — Progress Notes (Addendum)
10 Days Post-Op Procedure(s) (LRB): VIDEO ASSISTED THORACOSCOPY (VATS)/EMPYEMA (Right) Subjective: Mr.Caporaso feels better this morning.  States he is finally able to take a deep breath.  He continues to have diarrhea but states it has improved.  C. Diff is pending  Objective: Vital signs in last 24 hours: Temp:  [97.4 F (36.3 C)-98.3 F (36.8 C)] 97.4 F (36.3 C) (07/01 0428) Pulse Rate:  [74-89] 74  (07/01 0428) Cardiac Rhythm:  [-] Normal sinus rhythm (06/30 2030) Resp:  [16-18] 18  (07/01 0428) BP: (151-154)/(83-89) 152/83 mmHg (07/01 0428) SpO2:  [98 %] 98 % (07/01 0428)  06/30 0701 - 07/01 0700 In: -  Out: 450 [Urine:450]    General appearance: alert, cooperative and no distress Heart: regular rate and rhythm Lungs: diminished breath sounds RLL Abdomen: soft, non-tender; bowel sounds normal; no masses,  no organomegaly Wound: clean and dry  Lab Results: No results found for this basename: WBC:2,HGB:2,HCT:2,PLT:2 in the last 72 hours BMET: No results found for this basename: NA:2,K:2,CL:2,CO2:2,GLUCOSE:2,BUN:2,CREATININE:2,CALCIUM:2 in the last 72 hours  PT/INR: No results found for this basename: LABPROT,INR in the last 72 hours ABG    Component Value Date/Time   PHART 7.501* 01/07/2012 1345   HCO3 25.1* 01/07/2012 1345   TCO2 26.1 01/07/2012 1345   O2SAT 93.0 01/07/2012 1345   CBG (last 3)  No results found for this basename: GLUCAP:3 in the last 72 hours  Assessment/Plan: S/P Procedure(s) (LRB): VIDEO ASSISTED THORACOSCOPY (VATS)/EMPYEMA (Right)  1.Pulmonary- S/P chest tube placement for empyema connected to Mini Express which will remain in place at discharge, CXR continues to show Right lung collapse/consolidation 2.Diarrhea- improving C. Diff pending 3. Leukocytosis- Last WBC trending down, will require 3-6 weeks of ABX per Dr. Edwyna Shell for empyema 4. Further management per primary team  LOS: 11 days    BARRETT, ERIN 01/18/2012   patient examined and  medical record reviewed,agree with above note. VAN TRIGT III,Latwan Luchsinger 01/18/2012

## 2012-01-19 MED ORDER — THIAMINE HCL 100 MG PO TABS: 100.0000 mg | ORAL_TABLET | Freq: Every day | ORAL | Status: AC

## 2012-01-19 MED ORDER — ADULT MULTIVITAMIN W/MINERALS CH: 1.0000 | ORAL_TABLET | Freq: Every day | ORAL | Status: DC

## 2012-01-19 MED ORDER — POLYSACCHARIDE IRON COMPLEX 150 MG PO CAPS: 150.0000 mg | ORAL_CAPSULE | Freq: Every day | ORAL | Status: DC

## 2012-01-19 MED ORDER — INDOMETHACIN 25 MG PO CAPS: 25.0000 mg | ORAL_CAPSULE | Freq: Two times a day (BID) | ORAL | Status: DC

## 2012-01-19 MED ORDER — FUROSEMIDE 20 MG PO TABS: 20.0000 mg | ORAL_TABLET | Freq: Every day | ORAL | Status: DC | PRN

## 2012-01-19 MED ORDER — FOLIC ACID 1 MG PO TABS: 1.0000 mg | ORAL_TABLET | Freq: Every day | ORAL | Status: AC

## 2012-01-19 MED ORDER — LOPERAMIDE HCL 2 MG PO CAPS: 2.0000 mg | ORAL_CAPSULE | ORAL | Status: AC | PRN

## 2012-01-19 MED ORDER — SODIUM CHLORIDE 0.9 % IV SOLN
1.0000 g | INTRAVENOUS | Status: DC
  Filled 2012-01-19: qty 1

## 2012-01-19 MED ORDER — SODIUM CHLORIDE 0.9 % IJ SOLN: 10.0000 mL | INTRAMUSCULAR | Status: DC | PRN

## 2012-01-19 MED ORDER — SODIUM CHLORIDE 0.9 % IV SOLN: 1.0000 g | INTRAVENOUS | Status: DC

## 2012-01-19 MED ORDER — PREDNISONE 10 MG PO TABS: ORAL_TABLET | ORAL | Status: DC

## 2012-01-19 MED ORDER — DIPHENHYDRAMINE HCL 25 MG PO CAPS: 25.0000 mg | ORAL_CAPSULE | ORAL | Status: DC | PRN

## 2012-01-19 NOTE — Discharge Summary (Addendum)
Physician Discharge Summary  Patient ID: Bob Wilson MRN: [id number] DOB/AGE: Mar 09, 1933 78 y.o.  Admit date: 01/07/2012 Discharge date: 01/19/2012    Discharge Diagnoses:   Empyema s/p VATS with chest tube insertion Acute Gouty Arthritis / Left knee effusion s/p aspiration.   Alcohol abuse Hypokalemia Hyponatremia Lung nodule Deconditioning Tooth Decay Diarrhea  Anemia    Brief Summary: Bob Wilson is a 78 y.o. y/o male with a PMH of Anemia, Gout, HLD, HTN, GERD, ETOH abuse  initially seen in consultation on 6/14 by Dr Maple Hudson for evaluation of left lung nodule and large Right effusion. US guided thoracentesis was performed by IR on 6/17 with 450 cc of foul smelling cloudy fluid consistent with empyema was removed> admitted 6/20 by TCTS service in preparation for VATS drainage of R pleural space planned for 6/21.  However, patient was felt to be high surgical risk and did not have VATS.  CT remained in place for drainage.  Treated with IV antibiotics.  Cultures positive for microaerophilic strep.  Discharge plan:  Home with IV abx until follow up with ID.      Hospital Course by Discharge Diagnosis  Empyema s/p with chest tube insertion / Left Lung nodule Presented initially to Dr. Maple Hudson in Pulmonary office.  Large right effusion noted on CXR.  Underwent US guided thoracentesis was performed by IR on 6/17 with 450 cc of foul smelling cloudy fluid consistent with empyema (likely related to poor dentition and aspiration with noted history of excessive alcohol use).  Culture 6/17 confirmatory this is microaerophic strep.  He was deferred as a candidate for VATS due to high surgical risk.  He was treated with IV Zosyn (did not tolerate rocephin due to hives).  At time of discharge, he will continue on Ertapenem 1g daily until follow up with Dr. Drue Second (ID).  He will be discharged home with R chest tube and PICC line for IV abx.  Home health will follow for IV needs and chest tube.    Recommend he follow up with Dr. Maple Hudson regarding pulmonary nodule once further stabilized from acute pulmonary issues.   Acute Gouty Arthritis / Left knee effusion s/p aspiration.   Left knee effusion s/p aspiration and confirmed acute gout.  Aspirated by Ortho and injected with depo-medrol and significant improvement. He will continue on medications as below with prednisone taper.  PRN follow up with Ortho as needed. Colchicine held due to diarrhea.  Alcohol abuse / Tooth Decay Hx of significant ETOH abuse.  Decay of teeth.  Needs to follow up with a dentist and outpatient ETOH treatment if patient accepts.   Hypokalemia /Hponatremia Resolved.    Deconditioning In the setting of prolonged critical illness.  He will be discharged with home PT/OT, RN.  SNF rehab offered but patient insistent to go home.   Diarrhea  Noted significant diarrhea during hospitalization.  C-diff negative.  Thought to be related to colchicine and colace.  Both held and diarrhea resolved.    EVENTS  6/24 - IR plans pig tail on 6/25. Due to drop in hgb Dr Edwyna Shell deferred VATS  6/25 - s/p pigtail IR and 1200cc pus drained out  6/29 - L knee effusion aspirated 80ml fluid, depo medrol injection     Consults: TCTS - Dr. Kennith Maes   Discharge Exam Gen: WDWN in NAD  HEENT: very poor dentition  Neck: No JVD, no LAN  Chest: s1s2 rrr, no m/r/g, Rt chest tube with cloudy yellow/brown drainage  Lungs: clear  on L, R diminished, resp's even/non-labored  Cardiovascular: RRR s M  Abdomen: NABS, soft, NT  Ext: 1-2 symmetric edema, L knee effusion, no erythema, heat  Neuro: no focal deficits .   Filed Vitals:   01/17/12 2144 01/18/12 0428 01/18/12 2029 01/19/12 0445  BP: 151/85 152/83 151/85 166/89  Pulse: 78 74 78 80  Temp: 98.3 F (36.8 C) 97.4 F (36.3 C) 97.4 F (36.3 C) 98.9 F (37.2 C)  TempSrc: Oral Oral Oral Oral  Resp: 18 18 18 20   Height:      Weight:    172 lb 9.9 oz (78.3 kg)  SpO2: 98% 98%  99% 97%    Discharge Labs  BMET  Lab 01/14/12 0450  NA 135  K 3.8  CL 101  CO2 25  GLUCOSE 90  BUN 21  CREATININE 1.44*  CALCIUM 8.1*  MG --  PHOS --    CBC   Lab 01/18/12 0818 01/14/12 0450 01/13/12 1516  HGB 8.7* 8.7* 9.0*  HCT 24.3* 23.9* 24.7*  WBC 14.5* 19.6* 22.0*  PLT 598* 684* 679*     Discharge Orders    Future Appointments: Provider: Department: Dept Phone: Center:   01/28/2012 10:30 AM Judyann Munson, MD [facility] [phone] [facility]   02/02/2012 3:30 PM Julio Sicks, NP [facility] [phone] None   02/16/2012 9:30 AM Waymon Budge, MD [facility] [phone] None   02/18/2012 3:30 PM Delight Ovens, MD [facility] [phone] [facility]     Future Orders Please Complete By Expires   Diet - low sodium heart healthy      Increase activity slowly      Discharge instructions      Comments:   [facility]- PT, OT, RN for home management of Right Chest Tube, IV antibiotics.   Discharge wound care:      Comments:   CT dressing to be changed per home health protocol.  Keep clean and dry.       Follow-up Information    Follow up with GERHARDT,EDWARD B, MD. (PA/LAT CXR to be taken (at [facility] which is in the same building as Dr. Dennie Maizes office) on 02/18/2012 at 2:30 pm;Appointment with Dr. Tyrone Sage is on 02/18/2012 at 3:30 pm)    Contact information:   [address] [place] [address] [phone]       Follow up with Eulas Post, MD. (As needed)    Contact information:   [facility] [address], [address] [place] [address] [phone]       Follow up with Judyann Munson, MD on 01/28/2012. (Appt at 1030 am)    Contact information:   [address] [place] [address] [phone]       Follow up with [facility], NP on 02/02/2012. (Appt at 3:30 pm)    Contact information:   [facility] [address] [place] [address] [phone]       Follow up with Delight Ovens, MD on 02/04/2012. (Appt at 11 AM.   Arrive at 10 AM for CXR at [facility].  In same building as Dr. Dennie Maizes office. )    Contact information:   [address] [place] [address] [phone]           Medication List  As of 01/19/2012 12:48 PM   START taking these medications  diphenhydrAMINE 25 mg capsule   Commonly known as: BENADRYL   Take 1 capsule (25 mg total) by mouth every 4 (four) hours as needed for itching.      folic acid 1 MG tablet   Commonly known as: FOLVITE   Take 1 tablet (1 mg total) by mouth daily.      indomethacin 25 MG capsule   Commonly known as: INDOCIN   Take 1 capsule (25 mg total) by mouth 2 (two) times daily with a meal.      iron polysaccharides 150 MG capsule   Commonly known as: NIFEREX   Take 1 capsule (150 mg total) by mouth daily.      loperamide 2 MG capsule   Commonly known as: IMODIUM   Take 1 capsule (2 mg total) by mouth as needed for diarrhea or loose stools.      multivitamin with minerals Tabs   Take 1 tablet by mouth daily.      predniSONE 10 MG tablet   Commonly known as: DELTASONE   Two tabs daily for 5 days, then decrease to One tab daily for 5 days then stop      sodium chloride 0.9 % SOLN 50 mL with ertapenem 1 G SOLR 1 g   Inject 1 g into the vein daily.      thiamine 100 MG tablet   Take 1 tablet (100 mg total) by mouth daily.         CHANGE how you take these medications         furosemide 20 MG tablet   Commonly known as: LASIX   Take 1 tablet (20 mg total) by mouth daily as needed (As needed for lower extremity swelling ).   What changed: - how often to take the med - reasons to take the med         CONTINUE taking these medications         HYDROcodone-acetaminophen 5-500 MG per tablet   Commonly known as: VICODIN      ibuprofen 200  MG tablet   Commonly known as: ADVIL,MOTRIN         STOP taking these medications         colchicine 0.6 MG tablet          Where to get your medications    These are the prescriptions that you need to pick up.   You may get these medications from any pharmacy.         folic acid 1 MG tablet   furosemide 20 MG tablet   indomethacin 25 MG capsule   iron polysaccharides 150 MG capsule   predniSONE 10 MG tablet   sodium chloride 0.9 % SOLN 50 mL with ertapenem 1 G SOLR 1 g   thiamine 100 MG tablet         Information on where to get these meds is not yet available. Ask your nurse or doctor.         diphenhydrAMINE 25 mg capsule   loperamide 2 MG capsule   multivitamin with minerals Tabs              Disposition: Home with wife.  PT, OT, RN for discharge to assist with home health needs.   He is recommended fall precautions at home and PT 3x per week.    Discharged Condition: DEAGLAN LILE has met maximum benefit of inpatient care and is medically stable and cleared for discharge.  Patient is pending  follow up as above.      Time spent on disposition:  Greater than 35 minutes.   Signed: Canary Brim, NP-C  Pulmonary & Critical Care Pgr: [phone]     Independently examined pt, evaluated data & formulated above discharge care plan with NP. Appts made with TCTS, ID. Change to Ertapenem - for ease of dosing (note he had hives with ceftriaxone but tolerated zosyn well) - can be changed to PO augmentin at some point. Probiotics to prevent c. Diff. Minimise blood draws given Luan Moore requirements. Left lung nodule will need FU (Dr Maple Hudson) once acute issues resolved Low dose prednisone for gouty flare.  Abishai [place]iegas V.  [phone]

## 2012-01-19 NOTE — Progress Notes (Signed)
Peripherally Inserted Central Catheter/Midline Placement  The IV Nurse has discussed with the patient and/or persons authorized to consent for the patient, the purpose of this procedure and the potential benefits and risks involved with this procedure.  The benefits include less needle sticks, lab draws from the catheter and patient may be discharged home with the catheter.  Risks include, but not limited to, infection, bleeding, blood clot (thrombus formation), and puncture of an artery; nerve damage and irregular heat beat.  Alternatives to this procedure were also discussed.  PICC/Midline Placement Documentation        Stacie Glaze Horton 01/19/2012, 12:14 PM

## 2012-01-19 NOTE — Progress Notes (Signed)
Physical Therapy Treatment Patient Details Name: Bob Wilson MRN: [id number] DOB: 1933/04/15 Today's Date: 01/19/2012 Time: 8657-8469 PT Time Calculation (min): 23 min  PT Assessment / Plan / Recommendation Comments on Treatment Session  Patient with empyema, L lung nodule and R effusion.  Moving much better everyday.  Will need HHPT f/u.      Follow Up Recommendations  Home health PT;Supervision/Assistance - 24 hour    Barriers to Discharge  None      Equipment Recommendations  Hospital bed (lift chair)    Recommendations for Other Services  None  Frequency Min 3X/week   Plan Discharge plan remains appropriate;Frequency remains appropriate    Precautions / Restrictions Precautions Precautions: None Restrictions Weight Bearing Restrictions: No   Pertinent Vitals/Pain VSS, No pain    Mobility  Bed Mobility Bed Mobility: Rolling Right;Right Sidelying to Sit;Sitting - Scoot to Edge of Bed Rolling Right: 5: Supervision Right Sidelying to Sit: 5: Supervision Supine to Sit: Not tested (comment) Sitting - Scoot to Edge of Bed: 5: Supervision Details for Bed Mobility Assistance: no cues needed.  Doing better with bed mobility.   Transfers Transfers: Sit to Stand;Stand to Sit Sit to Stand: 5: Supervision;With upper extremity assist;From bed Stand to Sit: 5: Supervision;With upper extremity assist;To chair/3-in-1;With armrests Details for Transfer Assistance: Patient did not need cues for hand placement.  Continues to progress with transfers. Ambulation/Gait Ambulation/Gait Assistance: 5: Supervision Ambulation Distance (Feet): 150 Feet Assistive device: Rolling walker Ambulation/Gait Assistance Details: No cues or assist needed.  Patient states he is ready to d/c home.   Gait Pattern: Step-through pattern;Trunk flexed Stairs: No Wheelchair Mobility Wheelchair Mobility: No     PT Goals Acute Rehab PT Goals PT Goal: Supine/Side to Sit - Progress: Met PT Goal: Sit to  Supine/Side - Progress: Met PT Goal: Sit to Stand - Progress: Met PT Goal: Stand to Sit - Progress: Met PT Transfer Goal: Bed to Chair/Chair to Bed - Progress: Met PT Goal: Ambulate - Progress: Met  Visit Information  Last PT Received On: 01/19/12 Assistance Needed: +1    Subjective Data  Subjective: "I may go home today."   Cognition  Overall Cognitive Status: Appears within functional limits for tasks assessed/performed Arousal/Alertness: Awake/alert Orientation Level: Appears intact for tasks assessed Behavior During Session: [facility] for tasks performed    Balance  Static Sitting Balance Static Sitting - Balance Support: Bilateral upper extremity supported;Feet supported Static Sitting - Level of Assistance: 6: Modified independent (Device/Increase time) Static Sitting - Comment/# of Minutes: sat EOB 5 min Static Standing Balance Static Standing - Balance Support: Bilateral upper extremity supported;During functional activity Static Standing - Level of Assistance: 6: Modified independent (Device/Increase time) Static Standing - Comment/# of Minutes: 2 min  End of Session PT - End of Session Equipment Utilized During Treatment: Gait belt Activity Tolerance: Patient tolerated treatment well Patient left: in chair;with call bell/phone within reach Nurse Communication: Mobility status       INGOLD,Caydee [place] 01/19/2012, 10:15 AM [facility] [phone] (pager)

## 2012-01-19 NOTE — Progress Notes (Signed)
11 Days Post-Op Procedure(s) (LRB): VIDEO ASSISTED THORACOSCOPY (VATS)/EMPYEMA (Right) Subjective: No new complaints.  Patient states he is ready to go home  Objective: Vital signs in last 24 hours: Temp:  [97.4 F (36.3 C)-98.9 F (37.2 C)] 98.9 F (37.2 C) (07/02 0445) Pulse Rate:  [78-80] 80  (07/02 0445) Cardiac Rhythm:  [-] Normal sinus rhythm;Other (Comment) (07/01 1930) Resp:  [18-20] 20  (07/02 0445) BP: (151-166)/(85-89) 166/89 mmHg (07/02 0445) SpO2:  [97 %-99 %] 97 % (07/02 0445) Weight:  [172 lb 9.9 oz (78.3 kg)] 172 lb 9.9 oz (78.3 kg) (07/02 0445)   Intake/Output from previous day: 07/01 0701 - 07/02 0700 In: 1010 [P.O.:960; IV Piggyback:50] Out: 1060 [Urine:1050; Chest Tube:10]  General appearance: alert, cooperative and no distress Heart: regular rate and rhythm Lungs: diminished breath sounds on the right Abdomen: soft, non-tender; bowel sounds normal; no masses,  no organomegaly Wound: clean and dry  Lab Results:  Basename 01/18/12 0818  WBC 14.5*  HGB 8.7*  HCT 24.3*  PLT 598*   BMET: No results found for this basename: NA:2,K:2,CL:2,CO2:2,GLUCOSE:2,BUN:2,CREATININE:2,CALCIUM:2 in the last 72 hours  PT/INR: No results found for this basename: LABPROT,INR in the last 72 hours ABG    Component Value Date/Time   PHART 7.501* 01/07/2012 1345   HCO3 25.1* 01/07/2012 1345   TCO2 26.1 01/07/2012 1345   O2SAT 93.0 01/07/2012 1345   CBG (last 3)  No results found for this basename: GLUCAP:3 in the last 72 hours  Assessment/Plan: S/P Procedure(s) (LRB): VIDEO ASSISTED THORACOSCOPY (VATS)/EMPYEMA (Right)  1. Pulmonary- chest tube will remain in place, hooked up to mini express with drainage present, will repeat CXR in AM 2. Diarrhea- resolving, C. Diff negative 3. Leukocytosis- improving, will continue ABX for 3-6 weeks  4. Management per primary team, patient will need to follow up at [facility], appointment is in the chart    LOS: 12 days    Gloriana Piltz,  Doyle Kunath 01/19/2012

## 2012-01-20 LAB — BODY FLUID CULTURE: Culture: NO GROWTH

## 2012-01-22 ENCOUNTER — Other Ambulatory Visit: Payer: Self-pay | Admitting: Cardiothoracic Surgery

## 2012-01-22 DIAGNOSIS — J90 Pleural effusion, not elsewhere classified: Secondary | ICD-10-CM

## 2012-01-26 ENCOUNTER — Encounter: Payer: Self-pay | Admitting: Family Medicine

## 2012-01-26 ENCOUNTER — Ambulatory Visit (INDEPENDENT_AMBULATORY_CARE_PROVIDER_SITE_OTHER): Payer: Medicare Other | Admitting: Family Medicine

## 2012-01-26 VITALS — BP 138/78 | HR 84 | Temp 98.4°F | Wt 179.0 lb

## 2012-01-26 DIAGNOSIS — R609 Edema, unspecified: Secondary | ICD-10-CM | POA: Insufficient documentation

## 2012-01-26 DIAGNOSIS — J90 Pleural effusion, not elsewhere classified: Secondary | ICD-10-CM

## 2012-01-26 LAB — CBC WITH DIFFERENTIAL/PLATELET
Basophils Absolute: 0 10*3/uL (ref 0.0–0.1)
Eosinophils Absolute: 0.1 10*3/uL (ref 0.0–0.7)
Eosinophils Relative: 0.6 % (ref 0.0–5.0)
Lymphs Abs: 1.3 10*3/uL (ref 0.7–4.0)
MCV: 98.5 fl (ref 78.0–100.0)
Monocytes Absolute: 1.1 10*3/uL — ABNORMAL HIGH (ref 0.1–1.0)
Neutrophils Relative %: 83.4 % — ABNORMAL HIGH (ref 43.0–77.0)
Platelets: 502 10*3/uL — ABNORMAL HIGH (ref 150.0–400.0)
RDW: 18.9 % — ABNORMAL HIGH (ref 11.5–14.6)
WBC: 15 10*3/uL — ABNORMAL HIGH (ref 4.5–10.5)

## 2012-01-26 LAB — COMPREHENSIVE METABOLIC PANEL
ALT: 16 U/L (ref 0–53)
AST: 24 U/L (ref 0–37)
Albumin: 2 g/dL — ABNORMAL LOW (ref 3.5–5.2)
Alkaline Phosphatase: 130 U/L — ABNORMAL HIGH (ref 39–117)
Potassium: 3.5 mEq/L (ref 3.5–5.1)
Sodium: 138 mEq/L (ref 135–145)
Total Bilirubin: 0.5 mg/dL (ref 0.3–1.2)
Total Protein: 6.8 g/dL (ref 6.0–8.3)

## 2012-01-26 NOTE — Progress Notes (Signed)
Admitted with empyema, VATS not done, treated with ertapenem via PICC with chest tube in place.  Has f/u with ID and [facility] pending.  Had a gout flare in L knee during admission, aspirated and injected with steroids.  Since coming home, no CP and not more sob but noted inc in BLE edema.  Came in today for eval.  No rash, no fevers, no sputum.  Inc in salt intake since coming home from hospital.   Hospital discharge records noted.   PMH and SH reviewed  ROS: See HPI, otherwise noncontributory.  Meds, vitals, and allergies reviewed.   nad In wheelchair Neck supple rrr Dec BS at the RLL, ctab o/w Chest tube in place PICC site CDI on R arm abd soft, not ttp Ext with 2+ pitting nontender edema at the ankles w/o erythema. 1+ DP pulses

## 2012-01-26 NOTE — Patient Instructions (Addendum)
Don't change your meds for now.  Go to the lab on the way out.  We'll contact you with your lab report. Take care.  

## 2012-01-26 NOTE — Assessment & Plan Note (Addendum)
Worsened.  Likely related to salt load since coming home, but check cmet/cbc/bnp given the recent events.  He has f/u with ID and [facility] pending.  Will address lasix dose after labs are back.  Has been elevating legs.

## 2012-01-27 ENCOUNTER — Other Ambulatory Visit: Payer: Self-pay | Admitting: Family Medicine

## 2012-01-27 ENCOUNTER — Ambulatory Visit: Payer: Medicare Other | Admitting: Family Medicine

## 2012-01-28 ENCOUNTER — Ambulatory Visit (INDEPENDENT_AMBULATORY_CARE_PROVIDER_SITE_OTHER): Payer: Medicare Other | Admitting: Cardiothoracic Surgery

## 2012-01-28 ENCOUNTER — Encounter: Payer: Self-pay | Admitting: Cardiothoracic Surgery

## 2012-01-28 ENCOUNTER — Other Ambulatory Visit: Payer: Self-pay | Admitting: Cardiothoracic Surgery

## 2012-01-28 ENCOUNTER — Ambulatory Visit (INDEPENDENT_AMBULATORY_CARE_PROVIDER_SITE_OTHER): Payer: Medicare Other | Admitting: Internal Medicine

## 2012-01-28 ENCOUNTER — Encounter: Payer: Self-pay | Admitting: Internal Medicine

## 2012-01-28 ENCOUNTER — Ambulatory Visit
Admission: RE | Admit: 2012-01-28 | Discharge: 2012-01-28 | Disposition: A | Discharge status: Home / Self Care | Payer: Medicare Other | Source: Ambulatory Visit | Attending: Cardiothoracic Surgery | Admitting: Cardiothoracic Surgery

## 2012-01-28 VITALS — BP 161/93 | HR 69 | Resp 18 | Ht 75.0 in | Wt 179.0 lb

## 2012-01-28 VITALS — BP 158/83 | HR 68 | Temp 97.5°F

## 2012-01-28 DIAGNOSIS — J869 Pyothorax without fistula: Secondary | ICD-10-CM

## 2012-01-28 DIAGNOSIS — J90 Pleural effusion, not elsewhere classified: Secondary | ICD-10-CM

## 2012-01-28 DIAGNOSIS — Z09 Encounter for follow-up examination after completed treatment for conditions other than malignant neoplasm: Secondary | ICD-10-CM

## 2012-01-28 NOTE — Progress Notes (Signed)
[address]            [name] [address]          [phone]    PCP is Hannah Beat, MD Referring Provider is Hannah Beat, MD  Chief Complaint  Patient presents with  . Routine Post Op    1 WK WITH CXR    HPI: This 78 year old African American male has a long history of alcoholism and is still consuming alcohol. He's had at least 3 document syncopal episodes in the last 6 weeks. He has a history of arthritis. He has developed increasing weakness, weight loss, and dyspnea. CT scan of the chest showed a loculated right pleural effusion. His white count was elevated. He was seen by Dr. Levonne Spiller young and a thoracentesis was done which revealed increased WBCs and loculation. He was referred for possible drainage of an empyema. He also has a history of recent rib fractures on the right so this could be an infected hemothorax. he quit smoking several years ago. He gets around with a walker. He is complaining of increasing weakness. CT scan of the chest also shows a left lingular 2 cm lesion that could be an early lung cancer. Chest x-ray 8 months ago did not show the effusion or the lesion. Dr. Edwyna Shell had admitted the patient for drainage of the right effusion however found at the time of admission that he was severely anemic and a Jehovah's Witness refusing blood chance transfusion. Ultimately a radiology placed drainage catheter in the right chest was decided on. The patient now is been at home for approximately a week with IV antibiotics through a PICC line and with continued drainage of a right empyema tube. He seems to be improving slightly but still has significant lower extremity edema. He denies fevers or chills  Past Medical History  Diagnosis Date  . Anemia     NOS  . Arthritis   . Gout   . Hyperlipidemia   . Hypertension   . GERD (gastroesophageal reflux disease)   . Elevated PSA     multiple times, refused urology eval [facility] notes)  . ETOH  abuse   . Medically noncompliant     noncompliant with follow ups, labs.  . Alcoholism 06/25/2011    Past Surgical History  Procedure Date  . Lipoma excision      Social History History  Substance Use Topics  . Smoking status: Former Smoker    Types: Cigarettes    Quit date: 07/20/1974  . Smokeless tobacco: Former Neurosurgeon   Comment: Remote- quit 45 years ago.  . Alcohol Use: 0.0 oz/week     rarely    Current Outpatient Prescriptions  Medication Sig Dispense Refill  . diphenhydrAMINE (BENADRYL) 25 mg capsule Take 1 capsule (25 mg total) by mouth every 4 (four) hours as needed for itching.  30 capsule    . folic acid (FOLVITE) 1 MG tablet Take 1 tablet (1 mg total) by mouth daily.  30 tablet  3  . furosemide (LASIX) 20 MG tablet take 1 tablet by mouth once daily  30 tablet  2  . furosemide (LASIX) 40 MG tablet Take 40 mg by mouth daily. TIMES 3 DAYS, THEN RESUME 20 MGM PER DAY.      Marland Kitchen HYDROcodone-acetaminophen (VICODIN) 5-500 MG per tablet Take 1 tablet by mouth every 8 (eight) hours as needed. For pain      .  ibuprofen (ADVIL,MOTRIN) 200 MG tablet Take 400-600 mg by mouth every 6 (six) hours as needed. For pain      . indomethacin (INDOCIN) 25 MG capsule Take 1 capsule (25 mg total) by mouth 2 (two) times daily with a meal.  60 capsule  0  . iron polysaccharides (NIFEREX) 150 MG capsule Take 1 capsule (150 mg total) by mouth daily.  30 capsule  3  . loperamide (IMODIUM) 2 MG capsule Take 1 capsule (2 mg total) by mouth as needed for diarrhea or loose stools.  30 capsule    . Multiple Vitamin (MULTIVITAMIN WITH MINERALS) TABS Take 1 tablet by mouth daily.      . predniSONE (DELTASONE) 10 MG tablet Two tabs daily for 5 days, then decrease to One tab daily for 5 days then stop  15 tablet  0  . sodium chloride 0.9 % SOLN 50 mL with ertapenem 1 G SOLR 1 g Inject 1 g into the vein daily.  15 g  1  . thiamine 100 MG tablet Take 1 tablet (100 mg total) by mouth daily.  30 tablet  3  .  furosemide (LASIX) 20 MG tablet Take 1 tablet (20 mg total) by mouth daily as needed (As needed for lower extremity swelling ).  30 tablet  0    Allergies  Allergen Reactions  . Ace Inhibitors Other (See Comments)    REACTION: Angioedema  . Lisinopril Swelling and Other (See Comments)    REACTION: Facial Swelling    Review of Systems  Constitutional: Positive for fever, activity change, appetite change, fatigue and unexpected weight change.  HENT: Negative.   Eyes: Negative.   Respiratory: Positive for shortness of breath and wheezing.   Cardiovascular: Positive for palpitations and leg swelling.  Gastrointestinal: Positive for abdominal pain and abdominal distention.  Genitourinary: Positive for dysuria and difficulty urinating.  Musculoskeletal: Positive for myalgias, back pain, joint swelling, arthralgias and gait problem.  Skin: Negative.   Neurological: Positive for dizziness, syncope, weakness, light-headedness and numbness.  Hematological: Negative.   Psychiatric/Behavioral: Negative.     BP 161/93  Pulse 69  Resp 18  Ht 6\' 3"  (1.905 m)  Wt 179 lb (81.194 kg)  BMI 22.37 kg/m2  SpO2 96% Physical Exam  Constitutional: He is oriented to person, place, and time.   HENT:  Head: Normocephalic and atraumatic.  Right Ear: External ear normal.  Left Ear: External ear normal.  Mouth/Throat: Oropharynx is clear and moist.  Eyes: Conjunctivae and EOM are normal. Pupils are equal, round, and reactive to light.  Neck: Normal range of motion. Neck supple. No tracheal deviation present. No thyromegaly present.  Cardiovascular: S1 normal and S2 normal.  An irregular rhythm present.  Extrasystoles are present. Tachycardia present.   Murmur heard.  Systolic murmur is present with a grade of 2/6  Pulmonary/Chest: Tachypnea noted. He has decreased breath sounds in the right middle field and the right lower field. He has wheezes in the right middle field. He exhibits tenderness.    Abdominal: Soft. Bowel sounds are normal. He exhibits no mass. There is no tenderness. There is no rebound.  Musculoskeletal: He exhibits edema and tenderness.  Neurological: He is alert and oriented to person, place, and time. He has normal reflexes. A cranial nerve deficit is present.  Skin: Skin is warm and dry.  Psychiatric: He has a normal mood and affect. His behavior is normal. Judgment and thought content normal.     Dg Chest 2 View  01/28/2012  *RADIOLOGY REPORT*  Clinical Data: Cough, shortness of breath, right chest tube  CHEST - 2 VIEW  Comparison: Chest x-ray of 01/18/2012  Findings: A right upper extremity PICC line is present with the tip seen to the expected SVC - RA junction.  A right chest tube remains at the right lung base and there is little change in pleural and parenchymal opacity at the right lung base.  The left lung is clear.  Mediastinal contours are stable.  The heart is within upper limits normal.  IMPRESSION:  1.  Right upper extremity PICC line tip in lower SVC near SVC/RA junction. 2.  Right chest tube with no change in pleural and parenchymal opacity at the right lung base  Original Report Authenticated By: Juline Patch, M.D.    Impression:  Some improvement in the appearance of his infected right chest with IV antibiotics and a right chest tube in place. The drainage appears to be decreasing with only about 100 cc over the past several days. We'll obtain a followup chest x-ray one week from today, it did remain stable remove the chest tube He will continue on IV antibiotics for an additional 2 weeks and then can be converted to by mouth per infectious disease.   Delight Ovens MD  Beeper [phone] Office [phone] 01/28/2012 11:59 AM

## 2012-01-29 LAB — FUNGUS CULTURE W SMEAR: Fungal Smear: NONE SEEN

## 2012-01-31 NOTE — Progress Notes (Signed)
INFECTIOUS DISEASE CLINIC  RFV: hospital follow up for conservative management of empyema Subjective:    Patient ID: Bob Wilson, male    DOB: [birth year], 78 y.o.   MRN: [id number]  HPI  Bob Wilson is a 78 y.o. male with HTN, GERD, alcohol dependence who is also a jehovah's witness who was hospitalized from June 20 to July 3rd, loculated right sided empyema, WBC of 33.9K with 78N and 6% Bands, but Hgb/HCt  10.2/27., and  imaging which showed 1.5 x 1.3 cm macrolobulated nodule with some spiculated margins and retraction of the overlying pleura in the inferior segment lingula is concerning for primary bronchogenic neoplasm.  He saw Dr. Maple Hudson  As an outpatient on 6/17, where a thoracentesis removed 450 ml of cloudy, yellow, foul-smelling fluid. findings consistent with an empyema that cx isolated microaerophilic strep. Thus,the patient was admitted on 6/20 with the intent of undergoing VATS decortication for empyema and sending fluid for culture as well as cytology given concern for pulmonary neoplasm to be performed by Dr. Edwyna Shell.  Given the patient's religious beliefs regarding blood products, the surgery was postponed given the high likelihood for blood transfusions. A chest tube was placed on 6/20 for drainage of empyema. The patient was placed on vancomycin and piptazo since admit on 6/20 for broad coverage, and later narrowed to piptazo. We had attempted to change him to ceftriaxone and metronidazole, but the patient had developed a diffuse pruritic rash to ceftriaxone on the 2nd dose. The patient presents to clinic with a repeat cxr that shows some improvement from his last chest xray on 7/3. He denies any fever, chills. He has roughly 25-34mL output per day. He denies any diarrhea or thrush from his antibiotics. Since being home, he is feeling better, and has been gaining weight. He is about to meet with pulm critical care this afternoon to determine when his pig tail catheter will be removed.    Current Outpatient Prescriptions on File Prior to Visit  Medication Sig Dispense Refill  . diphenhydrAMINE (BENADRYL) 25 mg capsule Take 1 capsule (25 mg total) by mouth every 4 (four) hours as needed for itching.  30 capsule    . folic acid (FOLVITE) 1 MG tablet Take 1 tablet (1 mg total) by mouth daily.  30 tablet  3  . furosemide (LASIX) 20 MG tablet Take 1 tablet (20 mg total) by mouth daily as needed (As needed for lower extremity swelling ).  30 tablet  0  . furosemide (LASIX) 20 MG tablet take 1 tablet by mouth once daily  30 tablet  2  . HYDROcodone-acetaminophen (VICODIN) 5-500 MG per tablet Take 1 tablet by mouth every 8 (eight) hours as needed. For pain      . ibuprofen (ADVIL,MOTRIN) 200 MG tablet Take 400-600 mg by mouth every 6 (six) hours as needed. For pain      . indomethacin (INDOCIN) 25 MG capsule Take 1 capsule (25 mg total) by mouth 2 (two) times daily with a meal.  60 capsule  0  . iron polysaccharides (NIFEREX) 150 MG capsule Take 1 capsule (150 mg total) by mouth daily.  30 capsule  3  . Multiple Vitamin (MULTIVITAMIN WITH MINERALS) TABS Take 1 tablet by mouth daily.      . predniSONE (DELTASONE) 10 MG tablet Two tabs daily for 5 days, then decrease to One tab daily for 5 days then stop  15 tablet  0  . sodium chloride 0.9 % SOLN 50 mL with  ertapenem 1 G SOLR 1 g Inject 1 g into the vein daily.  15 g  1  . thiamine 100 MG tablet Take 1 tablet (100 mg total) by mouth daily.  30 tablet  3   Active Ambulatory Problems    Diagnosis Date Noted  . HYPERLIPIDEMIA 05/21/2008  . GOUT 02/11/2009  . ANEMIA-NOS 05/21/2008  . BENIGN PAROXYSMAL POSITIONAL VERTIGO 04/17/2010  . HYPERTENSION, ESSENTIAL NOS 02/11/2009  . GERD 05/21/2008  . DEGENERATIVE JOINT DISEASE, KNEE 05/21/2008  . HYPONATREMIA 07/15/2010  . LEUKOCYTOSIS 08/07/2010  . INGUINAL HERNIA, RIGHT 07/11/2010  . PARESTHESIA 07/11/2010  . TRANSAMINASES, SERUM, ELEVATED 08/07/2010  . PROSTATE SPECIFIC ANTIGEN,  ELEVATED 08/07/2010  . SOB (shortness of breath) on exertion 06/04/2011  . Alcoholism 06/25/2011  . Medically noncompliant 06/25/2011  . Pleural effusion on right 01/05/2012  . Nodule of left lung 01/05/2012  . Pre-operative respiratory examination 01/07/2012  . Empyema 01/07/2012  . Alcohol abuse 01/07/2012  . Hypokalemia 01/07/2012  . Hyponatremia 01/07/2012  . Lung nodule 01/07/2012  . Edema 01/26/2012   Resolved Ambulatory Problems    Diagnosis Date Noted  . ALCOHOLISM 08/07/2010   Past Medical History  Diagnosis Date  . Anemia   . Arthritis   . Gout   . Hyperlipidemia   . Hypertension   . GERD (gastroesophageal reflux disease)   . Elevated PSA   . ETOH abuse    family history is not on file. History  Substance Use Topics  . Smoking status: Former Smoker    Types: Cigarettes    Quit date: 07/20/1974  . Smokeless tobacco: Former Neurosurgeon   Comment: Remote- quit 45 years ago.  . Alcohol Use: 0.0 oz/week     rarely  - jehovah's witness   Review of Systems   Constitutional: Negative for fever, chills, diaphoresis, activity change, appetite change, fatigue and unexpected weight change.  HENT: Negative for congestion, sore throat, rhinorrhea, sneezing, trouble swallowing and sinus pressure.  Eyes: Negative for photophobia and visual disturbance.  Respiratory: Negative for cough, chest tightness, shortness of breath, wheezing and stridor.  Cardiovascular: Negative for chest pain, palpitations and leg swelling.  Gastrointestinal: Negative for nausea, vomiting, abdominal pain, diarrhea, constipation, blood in stool, abdominal distention and anal bleeding.  Genitourinary: Negative for dysuria, hematuria, flank pain and difficulty urinating.  Musculoskeletal: Negative for myalgias, back pain, joint swelling, arthralgias and gait problem.  Skin: Negative for color change, pallor, rash and wound.  Neurological: Negative for dizziness, tremors, weakness and light-headedness.   Hematological: Negative for adenopathy. Does not bruise/bleed easily.  Psychiatric/Behavioral: Negative for behavioral problems, confusion, sleep disturbance, dysphoric mood, decreased concentration and agitation.       Objective:   Physical Exam BP 158/83  Pulse 68  Temp 97.5 F (36.4 C) (Oral) Physical Exam  Constitutional: He is oriented to person, place, and time. He appears well-developed and well-nourished. No distress.  HENT: [place]/AT, PERRLA, EOMI. Poor dentition Mouth/Throat: Oropharynx is clear and moist. No oropharyngeal exudate.  Cardiovascular: Normal rate, regular rhythm and normal heart sounds. Exam reveals no gallop and no friction rub. No murmur heard.  Pulmonary/Chest: Effort normal and breath sounds normal, except decrease at the right base. No respiratory distress. He has no wheezes. Right side pig tail catheter. No erythema. Draining opaque yellowish fluid. Abdominal: Soft. Bowel sounds are normal. He exhibits no distension. There is no tenderness.  Lymphadenopathy:  He has no cervical adenopathy.  Neurological: He is alert and oriented to person, place, and time.  Skin:  Skin is warm and dry. No rash noted. No erythema.  Psychiatric: He has a normal mood and affect. His behavior is normal.   Labs/micro: 6/17 culture: microaerophilic strep Lab Results  Component Value Date   WBC 15.0* 01/26/2012   HGB 8.3 Repeated and verified X2.* 01/26/2012   HCT 25.3 Repeated and verified X2.* 01/26/2012   MCV 98.5 01/26/2012   PLT 502.0* 01/26/2012       Assessment & Plan:  Empyema = continue with piptazo, and plan to switch to amox/clav as oral regimen once pig tail catheter removed. Will check cbc with diff at next visit.   Anemia = asked patient to return to PCP to see what addn medications, iron repletion that he can undergo.  Thrombocytosis = likely secondary to underlying pulmonary infection  If he has side effects of diarrhea, he is instructed to call the clinic.  rtc  in 2 wks.

## 2012-02-01 ENCOUNTER — Ambulatory Visit (INDEPENDENT_AMBULATORY_CARE_PROVIDER_SITE_OTHER): Payer: Medicare Other | Admitting: Family Medicine

## 2012-02-01 ENCOUNTER — Encounter: Payer: Self-pay | Admitting: Family Medicine

## 2012-02-01 ENCOUNTER — Other Ambulatory Visit: Payer: Self-pay | Admitting: Licensed Clinical Social Worker

## 2012-02-01 ENCOUNTER — Other Ambulatory Visit: Payer: Self-pay | Admitting: Cardiothoracic Surgery

## 2012-02-01 VITALS — BP 148/78 | Temp 98.3°F | Ht 75.0 in | Wt 178.5 lb

## 2012-02-01 DIAGNOSIS — J869 Pyothorax without fistula: Secondary | ICD-10-CM

## 2012-02-01 DIAGNOSIS — J90 Pleural effusion, not elsewhere classified: Secondary | ICD-10-CM

## 2012-02-01 DIAGNOSIS — R0602 Shortness of breath: Secondary | ICD-10-CM

## 2012-02-01 DIAGNOSIS — R911 Solitary pulmonary nodule: Secondary | ICD-10-CM

## 2012-02-01 DIAGNOSIS — R609 Edema, unspecified: Secondary | ICD-10-CM

## 2012-02-01 LAB — BASIC METABOLIC PANEL
BUN: 8 mg/dL (ref 6–23)
CO2: 32 mEq/L (ref 19–32)
Calcium: 8.3 mg/dL — ABNORMAL LOW (ref 8.4–10.5)
GFR: 112.26 mL/min (ref >60.00)
Glucose, Bld: 99 mg/dL (ref 70–99)

## 2012-02-01 NOTE — Patient Instructions (Signed)
Increase furosemide to 20 mg in the morning and 20 mg at noon. (1 tablet in the morning and 1 tablet at noon)  F/u next week with Dr. Patsy Lager, will recheck swelling and kidney function.

## 2012-02-01 NOTE — Progress Notes (Signed)
Nature conservation officer at [facility] [address] [place] [address] Phone: [phone] Fax: [phone]  Date:  02/01/2012   Name:  Bob Wilson   DOB:  November 07, 1932   MRN:  [id number]  PCP:  Hannah Beat, MD    Chief Complaint: Follow-up   History of Present Illness:  Bob Wilson is a 78 y.o. very pleasant male patient who presents with the following:  The patient is well known with a history of right-sided infected empyema, status post chest tube placement and inpatient hospital stay. He also has a known 2 cm left lingular lesion it has not been biopsied up to this point due to his unstable status, hospitalization, and there is some concern for potential cancer.  He is feeling somewhat better, but he has had some dramatic lower extremity edema. A partner Dr. Cheree Ditto saw him last week, and placed him on some Lasix. His cardiac BNP was significantly elevated. No known prior diagnosis of congestive heart failure. I have advised him on several occasions to see cardiological evaluation and followup, but he is declined evaluation. When he was on 40 mg of Lasix, he did have improved urine out put and decreased lower extremity edema.  He is currently on IV antibiotics and on a PICC line and has a right-sided chest drainage tube.  Pro B Natriuretic peptide (BNP) 615.0 (H)   CBC:    Component Value Date/Time   WBC 15.0* 01/26/2012 1156   HGB 8.3 Repeated and verified X2.* 01/26/2012 1156   HCT 25.3 Repeated and verified X2.* 01/26/2012 1156   PLT 502.0* 01/26/2012 1156   MCV 98.5 01/26/2012 1156   NEUTROABS 12.5* 01/26/2012 1156   LYMPHSABS 1.3 01/26/2012 1156   MONOABS 1.1* 01/26/2012 1156   EOSABS 0.1 01/26/2012 1156   BASOSABS 0.0 01/26/2012 1156     120 cc of drain, last Friday.   7/11/CVTS OV: He's had at least 3 document syncopal episodes in the last 6 weeks. He has a history of arthritis. He has developed increasing weakness, weight loss, and dyspnea. CT scan of the chest showed a  loculated right pleural effusion. His white count was elevated. He was seen by Dr. Levonne Spiller young and a thoracentesis was done which revealed increased WBCs and loculation. He was referred for possible drainage of an empyema. He also has a history of recent rib fractures on the right so this could be an infected hemothorax. he quit smoking several years ago. He gets around with a walker. He is complaining of increasing weakness. CT scan of the chest also shows a left lingular 2 cm lesion that could be an early lung cancer. Chest x-ray 8 months ago did not show the effusion or the lesion. Dr. Edwyna Shell had admitted the patient for drainage of the right effusion however found at the time of admission that he was severely anemic and a Jehovah's Witness refusing blood chance transfusion. Ultimately a radiology placed drainage catheter in the right chest was decided on. The patient now is been at home for approximately a week with IV antibiotics through a PICC line and with continued drainage of a right empyema tube. He seems to be improving slightly but still has significant lower extremity edema. He denies fevers or chills   Past Medical History, Surgical History, Social History, Family History, Problem List, Medications, and Allergies have been reviewed and updated if relevant.  Current Outpatient Prescriptions on File Prior to Visit  Medication Sig Dispense Refill  . folic  acid (FOLVITE) 1 MG tablet Take 1 tablet (1 mg total) by mouth daily.  30 tablet  3  . furosemide (LASIX) 20 MG tablet take 1 tablet by mouth once daily  30 tablet  2  . furosemide (LASIX) 40 MG tablet Take 40 mg by mouth daily. TIMES 3 DAYS, THEN RESUME 20 MGM PER DAY.      Marland Kitchen HYDROcodone-acetaminophen (VICODIN) 5-500 MG per tablet Take 1 tablet by mouth every 8 (eight) hours as needed. For pain      . ibuprofen (ADVIL,MOTRIN) 200 MG tablet Take 400-600 mg by mouth every 6 (six) hours as needed. For pain      . indomethacin (INDOCIN) 25 MG  capsule Take 1 capsule (25 mg total) by mouth 2 (two) times daily with a meal.  60 capsule  0  . iron polysaccharides (NIFEREX) 150 MG capsule Take 1 capsule (150 mg total) by mouth daily.  30 capsule  3  . Multiple Vitamin (MULTIVITAMIN WITH MINERALS) TABS Take 1 tablet by mouth daily.      . sodium chloride 0.9 % SOLN 50 mL with ertapenem 1 G SOLR 1 g Inject 1 g into the vein daily.  15 g  1  . thiamine 100 MG tablet Take 1 tablet (100 mg total) by mouth daily.  30 tablet  3  . DISCONTD: furosemide (LASIX) 20 MG tablet Take 1 tablet (20 mg total) by mouth daily as needed (As needed for lower extremity swelling ).  30 tablet  0  . diphenhydrAMINE (BENADRYL) 25 mg capsule Take 1 capsule (25 mg total) by mouth every 4 (four) hours as needed for itching.  30 capsule      Review of Systems: Continued shortness of breath. The patient needs to use a walker to ambulate. Edema. No substernal chest pain, continued right sided lateral chest pain. The patient reports that he has not consumed alcohol over the last several weeks. He has had some shakes, but these are improving  Physical Examination: Filed Vitals:   02/01/12 0956  BP: 148/78  Temp: 98.3 F (36.8 C)   Filed Vitals:   02/01/12 0956  Height: 6\' 3"  (1.905 m)  Weight: 178 lb 8 oz (80.967 kg)   Body mass index is 22.31 kg/(m^2). Ideal Body Weight: Weight in (lb) to have BMI = 25: 199.6    GEN: WDWN, NAD, Non-toxic, A & O x 3 HEENT: Atraumatic, Normocephalic. Neck supple. No masses, No LAD. Ears and Nose: No external deformity. CV: RRR, No M/G/R PULM: Right-sided crackles and decreased breath sounds. No wheezing  EXTR: 2+ LE edema NEURO Normal gait.  PSYCH: Normally interactive. Conversant. Not depressed or anxious appearing.  Calm demeanor.    Assessment and Plan: 1. SOB (shortness of breath) on exertion    2. Pleural effusion on right    3. Nodule of left lung    4. Edema  Basic metabolic panel  5. Empyema      Followup on  lower extremity edema, the patient may have heart failure. He has had several frank syncopal episodes over the last 6 months. He has declined evaluation previously. Cardiac BNP is greater than 600. I am going to increase his Lasix to 20 mg in the morning and 20 mg at noon. Recheck in 10 days.  He will need a full cardiac evaluation, however, is immediate empyema, cardiothoracic problems and a median stability take precedence. This was discussed in full with the patient and his wife.  Hannah Beat, MD

## 2012-02-02 ENCOUNTER — Encounter: Payer: Self-pay | Admitting: *Deleted

## 2012-02-02 ENCOUNTER — Inpatient Hospital Stay: Payer: Medicare Other | Admitting: Adult Health

## 2012-02-03 ENCOUNTER — Telehealth: Payer: Self-pay | Admitting: Internal Medicine

## 2012-02-03 NOTE — Telephone Encounter (Signed)
i called in 10 more days of ertapenem since pt original hospital order ran out. I had originally recommended piptazo, but was changed to ertapenem by his pulmonologist. Will have picc line pulled after 10days and switch to oral amox/clav. i have decided to continue IV antibiotics since he still had small amount of purulent drainage from his pigtail catheter at our appt.

## 2012-02-04 ENCOUNTER — Encounter: Payer: Self-pay | Admitting: Cardiothoracic Surgery

## 2012-02-04 ENCOUNTER — Ambulatory Visit
Admission: RE | Admit: 2012-02-04 | Discharge: 2012-02-04 | Disposition: A | Discharge status: Home / Self Care | Payer: Medicare Other | Source: Ambulatory Visit | Attending: Cardiothoracic Surgery | Admitting: Cardiothoracic Surgery

## 2012-02-04 ENCOUNTER — Ambulatory Visit (INDEPENDENT_AMBULATORY_CARE_PROVIDER_SITE_OTHER): Payer: Medicare Other | Admitting: Cardiothoracic Surgery

## 2012-02-04 VITALS — BP 152/86 | HR 76 | Resp 20 | Ht 75.0 in | Wt 179.0 lb

## 2012-02-04 DIAGNOSIS — J869 Pyothorax without fistula: Secondary | ICD-10-CM

## 2012-02-04 NOTE — Patient Instructions (Signed)
Leave ct dressing on for two days Return in two weeks with chest xray

## 2012-02-04 NOTE — Progress Notes (Signed)
[address]            [name] [address]          [phone]       PCP is Hannah Beat, MD Referring Provider is Hannah Beat, MD  Chief Complaint  Patient presents with  . Empyema    1 wk f/u with cxr....has mini express, empyema    HPI: This 78 year old African American male has a long history of alcoholism and is still consuming alcohol. He's had at least 3 document syncopal episodes in the last 6 weeks. He has a history of arthritis. He has developed increasing weakness, weight loss, and dyspnea. CT scan of the chest showed a loculated right pleural effusion. His white count was elevated. He was seen by Dr. Levonne Spiller young and a thoracentesis was done which revealed increased WBCs and loculation. He was referred for possible drainage of an empyema. He also has a history of recent rib fractures on the right so this could be an infected hemothorax. he quit smoking several years ago. He gets around with a walker. He is complaining of increasing weakness. CT scan of the chest also shows a left lingular 2 cm lesion that could be an early lung cancer. Chest x-ray 8 months ago did not show the effusion or the lesion. Dr. Edwyna Shell had admitted the patient for drainage of the right effusion however found at the time of admission that he was severely anemic and a Jehovah's Witness refusing blood chance transfusion. Ultimately a radiology placed drainage catheter in the right chest was decided on. The patient now is been at home for approximately a week with IV antibiotics through a PICC line and with continued drainage of a right empyema tube. He seems to be improving slightly but still has significant lower extremity edema. He denies fevers or chills  Since last week he has had no fever or chills, continues on IV antibiotics through the PICC line. He has had very minimal drainage from the right chest tube.   Past Medical History  Diagnosis Date    . Anemia     NOS  . Arthritis   . Gout   . Hyperlipidemia   . Hypertension   . GERD (gastroesophageal reflux disease)   . Elevated PSA     multiple times, refused urology eval [facility] notes)  . ETOH abuse   . Medically noncompliant     noncompliant with follow ups, labs.  . Alcoholism 06/25/2011    Past Surgical History  Procedure Date  . Lipoma excision      Social History History  Substance Use Topics  . Smoking status: Former Smoker    Types: Cigarettes    Quit date: 07/20/1974  . Smokeless tobacco: Former Neurosurgeon   Comment: Remote- quit 45 years ago.  . Alcohol Use: 0.0 oz/week     rarely    Current Outpatient Prescriptions  Medication Sig Dispense Refill  . COLCRYS 0.6 MG tablet Take 0.6 mg by mouth daily.       . folic acid (FOLVITE) 1 MG tablet Take 1 tablet (1 mg total) by mouth daily.  30 tablet  3  . furosemide (LASIX) 20 MG tablet take 1 tablet by mouth once daily  30 tablet  2  . HYDROcodone-acetaminophen (VICODIN) 5-500 MG  per tablet Take 1 tablet by mouth every 8 (eight) hours as needed. For pain      . ibuprofen (ADVIL,MOTRIN) 200 MG tablet Take 400-600 mg by mouth every 6 (six) hours as needed. For pain      . indomethacin (INDOCIN) 25 MG capsule Take 1 capsule (25 mg total) by mouth 2 (two) times daily with a meal.  60 capsule  0  . iron polysaccharides (NIFEREX) 150 MG capsule Take 1 capsule (150 mg total) by mouth daily.  30 capsule  3  . Multiple Vitamin (MULTIVITAMIN WITH MINERALS) TABS Take 1 tablet by mouth daily.      . sodium chloride 0.9 % SOLN 50 mL with ertapenem 1 G SOLR 1 g Inject 1 g into the vein daily.  15 g  1  . thiamine 100 MG tablet Take 1 tablet (100 mg total) by mouth daily.  30 tablet  3  . diphenhydrAMINE (BENADRYL) 25 mg capsule Take 1 capsule (25 mg total) by mouth every 4 (four) hours as needed for itching.  30 capsule    . DISCONTD: furosemide (LASIX) 40 MG tablet Take 40 mg by mouth daily. TIMES 3 DAYS, THEN RESUME 20 MGM PER  DAY.        Allergies  Allergen Reactions  . Ace Inhibitors Other (See Comments)    REACTION: Angioedema  . Lisinopril Swelling and Other (See Comments)    REACTION: Facial Swelling    Review of Systems  Constitutional: Positive for fever, activity change, appetite change, fatigue and unexpected weight change.  HENT: Negative.   Eyes: Negative.   Respiratory: Positive for shortness of breath and wheezing.   Cardiovascular: Positive for palpitations and leg swelling.  Gastrointestinal: Positive for abdominal pain and abdominal distention.  Genitourinary: Positive for dysuria and difficulty urinating.  Musculoskeletal: Positive for myalgias, back pain, joint swelling, arthralgias and gait problem.  Skin: Negative.   Neurological: Positive for dizziness, syncope, weakness, light-headedness and numbness.  Hematological: Negative.   Psychiatric/Behavioral: Negative.     BP 152/86  Pulse 76  Resp 20  Ht 6\' 3"  (1.905 m)  Wt 179 lb (81.194 kg)  BMI 22.37 kg/m2  SpO2 97% Physical Exam  Constitutional: He is oriented to person, place, and time.   HENT:  Head: Normocephalic and atraumatic.  Right Ear: External ear normal.  Left Ear: External ear normal.  Mouth/Throat: Oropharynx is clear and moist.  Eyes: Conjunctivae and EOM are normal. Pupils are equal, round, and reactive to light.  Neck: Normal range of motion. Neck supple. No tracheal deviation present. No thyromegaly present.  Cardiovascular: S1 normal and S2 normal.  An irregular rhythm present.  Extrasystoles are present. Tachycardia present.   Murmur heard.  Systolic murmur is present with a grade of 2/6  Pulmonary/Chest: Tachypnea noted. He has decreased breath sounds in the right middle field and the right lower field. He has wheezes in the right middle field. He exhibits tenderness.  Abdominal: Soft. Bowel sounds are normal. He exhibits no mass. There is no tenderness. There is no rebound.  Musculoskeletal: He exhibits  edema and tenderness.  Neurological: He is alert and oriented to person, place, and time. He has normal reflexes. A cranial nerve deficit is present.  Skin: Skin is warm and dry.  Psychiatric: He has a normal mood and affect. His behavior is normal. Judgment and thought content normal.     Dg Chest 2 View  02/04/2012  *RADIOLOGY REPORT*  Clinical Data: History of rib fractures,  shortness of breath, follow-up  CHEST - 2 VIEW  Comparison: Chest x-ray of 01/28/2012  Findings: The right upper extremity PICC line remains in the mid lower SVC.  Pleural and parenchymal opacity at the right lung base is stable.  There does appear to be a pleurex catheter at the right lung base.  The left lung is clear.  Heart size is stable.  IMPRESSION:  1.  PICC line tip in lower SVC. 2.  No change in pleural and parenchymal opacity at the right lung base with a pleurex catheter remaining.  Original Report Authenticated By: Juline Patch, M.D.    Impression:  Some improvement in the appearance of his infected right chest with IV antibiotics and a right chest tube in place. The drainage appears to be decreasing to a very minimal amount over the past week. The right chest tube was removed in the office today, the patient was given wound care instructions. We will see him back in the office in 2 weeks with a followup chest x-ray  Delight Ovens MD  Beeper [phone] Office [phone] 02/04/2012 1:42 PM

## 2012-02-11 ENCOUNTER — Ambulatory Visit (INDEPENDENT_AMBULATORY_CARE_PROVIDER_SITE_OTHER): Payer: Medicare Other | Admitting: Family Medicine

## 2012-02-11 ENCOUNTER — Other Ambulatory Visit: Payer: Self-pay | Admitting: Cardiothoracic Surgery

## 2012-02-11 ENCOUNTER — Telehealth: Payer: Self-pay

## 2012-02-11 ENCOUNTER — Encounter: Payer: Self-pay | Admitting: Family Medicine

## 2012-02-11 VITALS — BP 130/90 | HR 70 | Temp 98.5°F | Ht 75.0 in | Wt 176.5 lb

## 2012-02-11 DIAGNOSIS — N289 Disorder of kidney and ureter, unspecified: Secondary | ICD-10-CM

## 2012-02-11 DIAGNOSIS — R609 Edema, unspecified: Secondary | ICD-10-CM

## 2012-02-11 DIAGNOSIS — E876 Hypokalemia: Secondary | ICD-10-CM

## 2012-02-11 DIAGNOSIS — J869 Pyothorax without fistula: Secondary | ICD-10-CM

## 2012-02-11 DIAGNOSIS — J90 Pleural effusion, not elsewhere classified: Secondary | ICD-10-CM

## 2012-02-11 LAB — BASIC METABOLIC PANEL
BUN: 6 mg/dL (ref 6–23)
Calcium: 8.3 mg/dL — ABNORMAL LOW (ref 8.4–10.5)
Chloride: 98 mEq/L (ref 96–112)
Creatinine, Ser: 0.8 mg/dL (ref 0.4–1.5)

## 2012-02-11 MED ORDER — HYDROCODONE-ACETAMINOPHEN 5-500 MG PO TABS: 1.0000 | ORAL_TABLET | Freq: Four times a day (QID) | ORAL | Status: DC | PRN

## 2012-02-11 NOTE — Progress Notes (Signed)
Nature conservation officer at [facility] [address] [place] [address] Phone: [phone] Fax: [phone]  Date:  02/11/2012   Name:  Bob Wilson   DOB:  May 04, 1933   MRN:  [id number]  PCP:  Hannah Beat, MD    Chief Complaint: Follow-up   History of Present Illness:  Bob Wilson is a 78 y.o. very pleasant male patient who presents with the following:  Recent complex medical history in a patient with an infected right-sided empyema, with recent chest tube removal by cardio thoracic surgery, he has been having a PICC line with IV antibiotics, and he also has a known 2 cm left lingular lesion has not been biopsied at this point to his instability, hospitalization, infection, and he also had very significant lower extremity edema and shortness of breath on his last office visit. We increased his Lasix to 20 mg by mouth twice a day, and he has had some improve diuresis.  Patient also has chronic severe gout, and has a large left-sided knee effusion. His knee was drained and injected on his previous hospitalization.   02/01/2012 OV The patient is well known with a history of right-sided infected empyema, status post chest tube placement and inpatient hospital stay. He also has a known 2 cm left lingular lesion it has not been biopsied up to this point due to his unstable status, hospitalization, and there is some concern for potential cancer.  He is feeling somewhat better, but he has had some dramatic lower extremity edema. A partner Dr. Cheree Ditto saw him last week, and placed him on some Lasix. His cardiac BNP was significantly elevated. No known prior diagnosis of congestive heart failure. I have advised him on several occasions to see cardiological evaluation and followup, but he is declined evaluation. When he was on 40 mg of Lasix, he did have improved urine out put and decreased lower extremity edema.  He is currently on IV antibiotics and on a PICC line and has a right-sided  chest drainage tube.  Pro B Natriuretic peptide (BNP) 615.0 (H)   CBC:    Component  Value  Date/Time     WBC  15.0*  01/26/2012 1156     HGB  8.3 Repeated and verified X2.*  01/26/2012 1156     HCT  25.3 Repeated and verified X2.*  01/26/2012 1156     PLT  502.0*  01/26/2012 1156     MCV  98.5  01/26/2012 1156     NEUTROABS  12.5*  01/26/2012 1156     LYMPHSABS  1.3  01/26/2012 1156     MONOABS  1.1*  01/26/2012 1156     EOSABS  0.1  01/26/2012 1156     BASOSABS  0.0  01/26/2012 1156      120 cc of drain, last Friday.   7/11/CVTS OV: He's had at least 3 document syncopal episodes in the last 6 weeks. He has a history of arthritis. He has developed increasing weakness, weight loss, and dyspnea. CT scan of the chest showed a loculated right pleural effusion. His white count was elevated. He was seen by Dr. Levonne Spiller young and a thoracentesis was done which revealed increased WBCs and loculation. He was referred for possible drainage of an empyema. He also has a history of recent rib fractures on the right so this could be an infected hemothorax. he quit smoking several years ago. He gets around with a walker. He is complaining of increasing weakness. CT  scan of the chest also shows a left lingular 2 cm lesion that could be an early lung cancer. Chest x-ray 8 months ago did not show the effusion or the lesion. Dr. Edwyna Shell had admitted the patient for drainage of the right effusion however found at the time of admission that he was severely anemic and a Jehovah's Witness refusing blood chance transfusion. Ultimately a radiology placed drainage catheter in the right chest was decided on. The patient now is been at home for approximately a week with IV antibiotics through a PICC line and with continued drainage of a right empyema tube. He seems to be improving slightly but still has significant lower extremity edema. He denies fevers or chills  He's had at least 3 document syncopal episodes in the last 6 weeks. He has a  history of arthritis. He has developed increasing weakness, weight loss, and dyspnea. CT scan of the chest showed a loculated right pleural effusion. His white count was elevated. He was seen by Dr. Levonne Spiller young and a thoracentesis was done which revealed increased WBCs and loculation. He was referred for possible drainage of an empyema. He also has a history of recent rib fractures on the right so this could be an infected hemothorax. he quit smoking several years ago. He gets around with a walker. He is complaining of increasing weakness. CT scan of the chest also shows a left lingular 2 cm lesion that could be an early lung cancer. Chest x-ray 8 months ago did not show the effusion or the lesion. Dr. Edwyna Shell had admitted the patient for drainage of the right effusion however found at the time of admission that he was severely anemic and a Jehovah's Witness refusing blood chance transfusion. Ultimately a radiology placed drainage catheter in the right chest was decided on. The patient now is been at home for approximately a week with IV antibiotics through a PICC line and with continued drainage of a right empyema tube. He seems to be improving slightly but still has significant lower extremity edema. He denies fevers or chills   Patient Active Problem List  Diagnosis  . HYPERLIPIDEMIA  . GOUT  . ANEMIA-NOS  . BENIGN PAROXYSMAL POSITIONAL VERTIGO  . HYPERTENSION, ESSENTIAL NOS  . GERD  . DEGENERATIVE JOINT DISEASE, KNEE  . HYPONATREMIA  . LEUKOCYTOSIS  . INGUINAL HERNIA, RIGHT  . PARESTHESIA  . TRANSAMINASES, SERUM, ELEVATED  . PROSTATE SPECIFIC ANTIGEN, ELEVATED  . SOB (shortness of breath) on exertion  . Alcoholism  . Medically noncompliant  . Pleural effusion on right  . Nodule of left lung  . Empyema  . Edema    Past Medical History  Diagnosis Date  . Anemia     NOS  . Arthritis   . Gout   . Hyperlipidemia   . Hypertension   . GERD (gastroesophageal reflux disease)   .  Elevated PSA     multiple times, refused urology eval [facility] notes)  . ETOH abuse   . Medically noncompliant     noncompliant with follow ups, labs.  . Alcoholism 06/25/2011    Past Surgical History  Procedure Date  . Lipoma excision     History  Substance Use Topics  . Smoking status: Former Smoker    Types: Cigarettes    Quit date: 07/20/1974  . Smokeless tobacco: Former Neurosurgeon   Comment: Remote- quit 45 years ago.  . Alcohol Use: 0.0 oz/week     rarely    No family history on  file.  Allergies  Allergen Reactions  . Ace Inhibitors Other (See Comments)    REACTION: Angioedema  . Lisinopril Swelling and Other (See Comments)    REACTION: Facial Swelling    Medication list has been reviewed and updated.  Current Outpatient Prescriptions on File Prior to Visit  Medication Sig Dispense Refill  . COLCRYS 0.6 MG tablet Take 0.6 mg by mouth daily.       . folic acid (FOLVITE) 1 MG tablet Take 1 tablet (1 mg total) by mouth daily.  30 tablet  3  . furosemide (LASIX) 20 MG tablet take 1 tablet by mouth once daily  30 tablet  2  . HYDROcodone-acetaminophen (VICODIN) 5-500 MG per tablet Take 1 tablet by mouth every 8 (eight) hours as needed. For pain      . ibuprofen (ADVIL,MOTRIN) 200 MG tablet Take 400-600 mg by mouth every 6 (six) hours as needed. For pain      . indomethacin (INDOCIN) 25 MG capsule Take 1 capsule (25 mg total) by mouth 2 (two) times daily with a meal.  60 capsule  0  . iron polysaccharides (NIFEREX) 150 MG capsule Take 1 capsule (150 mg total) by mouth daily.  30 capsule  3  . Multiple Vitamin (MULTIVITAMIN WITH MINERALS) TABS Take 1 tablet by mouth daily.      . sodium chloride 0.9 % SOLN 50 mL with ertapenem 1 G SOLR 1 g Inject 1 g into the vein daily.  15 g  1  . thiamine 100 MG tablet Take 1 tablet (100 mg total) by mouth daily.  30 tablet  3  . diphenhydrAMINE (BENADRYL) 25 mg capsule Take 1 capsule (25 mg total) by mouth every 4 (four) hours as needed for  itching.  30 capsule      Review of Systems:  Knee pain, died improved. Increase by mouth intake. Improved ambulation and decreased shortness of breath. Improved edema.  Physical Examination: Filed Vitals:   02/11/12 0955  BP: 130/90  Pulse: 70  Temp: 98.5 F (36.9 C)   Filed Vitals:   02/11/12 0955  Height: 6\' 3"  (1.905 m)  Weight: 176 lb 8 oz (80.06 kg)   Body mass index is 22.06 kg/(m^2). Ideal Body Weight: Weight in (lb) to have BMI = 25: 199.6    GEN: WDWN, NAD, Non-toxic, A & O x 3 HEENT: Atraumatic, Normocephalic. Neck supple. No masses, No LAD. Ears and Nose: No external deformity. CV: RRR, No M/G/R. No JVD. No thrill. No extra heart sounds. PULM: decr bs R lung base, improved from prior EXTR: tr le edema PSYCH: Normally interactive. Conversant. Not depressed or anxious appearing.  Calm demeanor.    Assessment and Plan:  1. Renal insufficiency  Basic metabolic panel  2. Pleural effusion on right    3. Empyema    4. Edema     Check renal function. Edema is improving.  Continued pain, significant knee pain. Not appropriate to drain his left knee in this setting with recent asp and injection. I am going to give him some Vicodin to use on an as-needed basis.  Orders Today:  Orders Placed This Encounter  Procedures  . Basic metabolic panel    Medications Today: (Includes new updates added during medication reconciliation) Meds ordered this encounter  Medications  . INVANZ 1 G injection    Sig:   . sodium chloride 0.9 % infusion    Sig:   . HYDROcodone-acetaminophen (VICODIN) 5-500 MG per tablet  Sig: Take 1 tablet by mouth every 6 (six) hours as needed. For pain    Dispense:  60 tablet    Refill:  3     Hannah Beat, MD

## 2012-02-11 NOTE — Patient Instructions (Signed)
F/u 6 weeks 

## 2012-02-11 NOTE — Telephone Encounter (Signed)
Result noted, will send in KCL

## 2012-02-11 NOTE — Telephone Encounter (Signed)
Bob Wilson with [facility] lab called critical lab K is 2.7.

## 2012-02-11 NOTE — Addendum Note (Signed)
Addended by: Hannah Beat on: 02/11/2012 05:00 PM   Modules accepted: Orders

## 2012-02-12 MED ORDER — POTASSIUM CHLORIDE CRYS ER 20 MEQ PO TBCR: 20.0000 meq | EXTENDED_RELEASE_TABLET | Freq: Every day | ORAL | Status: DC

## 2012-02-12 NOTE — Addendum Note (Signed)
Addended by: Consuello Masse on: 02/12/2012 03:39 PM   Modules accepted: Orders

## 2012-02-15 ENCOUNTER — Telehealth: Payer: Self-pay | Admitting: Licensed Clinical Social Worker

## 2012-02-15 MED ORDER — AMOXICILLIN-POT CLAVULANATE 875-125 MG PO TABS: 1.0000 | ORAL_TABLET | Freq: Two times a day (BID) | ORAL | Status: AC

## 2012-02-15 NOTE — Telephone Encounter (Signed)
Patient is wanting to know when is his follow up and can his picc be pulled today?

## 2012-02-15 NOTE — Telephone Encounter (Signed)
Called Amy the RN doing patient home healthcare and advised her to D/C the IV Vanc and pull the patient PICC Dr Drue Second. Also verified the pharmacy the patient uses to send Augmentin 875 twice a day for 2 weeks also per Dr Drue Second

## 2012-02-16 ENCOUNTER — Encounter: Payer: Self-pay | Admitting: Internal Medicine

## 2012-02-16 ENCOUNTER — Ambulatory Visit (INDEPENDENT_AMBULATORY_CARE_PROVIDER_SITE_OTHER): Payer: Medicare Other | Admitting: Internal Medicine

## 2012-02-16 VITALS — BP 138/70 | HR 66 | Ht 75.0 in | Wt 174.0 lb

## 2012-02-16 DIAGNOSIS — R911 Solitary pulmonary nodule: Secondary | ICD-10-CM

## 2012-02-16 DIAGNOSIS — J869 Pyothorax without fistula: Secondary | ICD-10-CM

## 2012-02-16 NOTE — Progress Notes (Signed)
01/01/12- 23 yoM former smoker referred courtesy of Dr Dallas Schimke with a lung nodule and pleural effusion. Wife is here He has past hx of ETOH and non-compliance. He has had syncope with falls, most recently about 2 weeks PTA. Broke 3 right ribs and wearing rib brace. Weight loss 25 lbs over 2-3 years. He had reported abdominal pain relieved after hydrocodone and absent at this visit. DOE, productive cough light yellow, palpitation. Abnormal CXR led to CT chest and abd 12/28/11-Images reviewed with him and wife-- IMPRESSION:  1. 1.5 x 1.3 cm macrolobulated nodule with some spiculated margins  and retraction of the overlying pleura in the inferior segment  lingula is concerning for primary bronchogenic neoplasm. This  could be further evaluated with PET CT or CT-guided percutaneous  needle biopsy if clinically indicated.  2. Large right-sided partially loculated rim enhancing pleural  fluid collection concerning for potential empyema, malignant  pleural effusion, or potentially a resolving hemothorax (given the  presence of multiple right-sided rib fracture of this may be a  possibility). Clinical correlation is recommended.  3. Extensive passive atelectasis through the majority of the right  lower lobe, in addition to portions of the right middle and upper  lobes. There is also some subsegmental atelectasis or scarring in  the left lower lobe.  4. There are calcifications of the aortic valve. Echocardiographic  correlation for evaluation of potential valvular dysfunction may be  warranted if clinically indicated.   02/16/12- 65 yoM former smoker with R empyema/microaerophilic strep, tooth decay, left lower lobe nodule. Complicated by ETOH, Jehovah's Witness.   Wife is here [facility] f/u- 6/20- 01/19/2012. He had given history of repeated falls and had healing rib fractures on the right. Thoracentesis and then tube drainage byTSGY yielded microaerophilic strep. High surgical risk prevented VATS.  Rocephin caused hives so he was treated with IV Zosyn then Ertapenem at home ( ID/ Dr Drue Second). Chest tube and PICC line have now been removed by home health and he is about to pick up  prescription for Augmentin. He has follow up appointment in 2 days with Dr. Fredia Sorrow. The left lower lobe nodule is presumed to be cancer but has not yet been biopsied. He reports feeling much better. Ambulates at home with a walker. He denies fever, chills, significant productive cough. CXR 02/04/12-images reviewed with them ( lines recently  removed). IMPRESSION:  1. PICC line tip in lower SVC.  2. No change in pleural and parenchymal opacity at the right lung  base with a pleurex catheter remaining.  Original Report Authenticated By: Juline Patch, M.D.   ROS-see HPI Constitutional:   No-  weight loss ,No- night sweats, fevers, chills, + fatigue, lassitude. HEENT:   No-  headaches, difficulty swallowing, tooth/dental problems, sore throat,       No-  sneezing, itching, ear ache, nasal congestion, post nasal drip,  CV:  No-  anginal chest pain, orthopnea, PND, +swelling in lower extremities, anasarca, dizziness, palpitations Resp: + shortness of breath with exertion or at rest.             Little  productive cough,  little non-productive cough,  No- coughing up of blood.             No- change in color of mucus.  No- wheezing.  Resolved right lateral chest pain since rib fx. Skin: No-   rash or lesions. GI:  No-   heartburn, indigestion, abdominal pain, nausea, vomiting, GU:  MS:  No-  joint pain or swelling.   Neuro-     generalized weakness,  no recent falls Psych:  No- change in mood or affect. No depression or anxiety.  No memory loss.  OBJ- Physical Exam General- Alert, Oriented, Affect-appropriate, Distress- none acute, wheelchair, tall, slender Skin- rash-none, lesions- none, excoriation- none Lymphadenopathy- none Head- atraumatic            Eyes- Gross vision intact, PERRLA, conjunctivae  and secretions clear            Ears- Hearing, canals-normal            Nose- Clear, no-Septal dev, mucus, polyps, erosion, perforation             Throat- Mallampati II , mucosa clear , drainage- none, tonsils- atrophic, +tooth decay Neck- flexible , trachea midline, no stridor , thyroid nl, carotid no bruit Chest - symmetrical excursion , unlabored           Heart/CV- RRR , no murmur , no gallop  , no rub, nl s1 s2                           - JVD- none , edema 2+ ankles,   stasis changes- none, varices- none           Lung- clear to P&A in upper zones, wheeze- none, cough- none ,+  Dullness right lower chest, rub- none,            Chest wall-  Abd-  Br/ Gen/ Rectal- Not done, not indicated Extrem- cyanosis- none, clubbing, none, atrophy- none, strength- nl,  Neuro- weakness/using a walker

## 2012-02-16 NOTE — Assessment & Plan Note (Signed)
This is a presumed cancer in this former smoker until proven otherwise. Now that he has improved lung function on the right, Dr. Tyrone Sage may feel ready to go forward with needle biopsy. I will assist repeated but will sign off for now to avoid duplication of care.

## 2012-02-16 NOTE — Assessment & Plan Note (Signed)
Residual fluid probably loculated in the right base. A followup appointment with Dr. Tyrone Sage would make further decisions about drainage. He has been followed by Dr. Drue Second for infectious disease. Starting Augmentin. I will step back to be available if needed

## 2012-02-16 NOTE — Patient Instructions (Addendum)
Please keep your appointment with Dr Tyrone Sage. He may feel it is time to arrange a needle biopsy of the left lung nodule.  I will be happy to see you again if I can help, but will not make another appointment now, since I am overlapping with your other doctors.

## 2012-02-17 ENCOUNTER — Other Ambulatory Visit: Payer: Medicare Other

## 2012-02-18 ENCOUNTER — Encounter: Payer: Medicare Other | Admitting: Cardiothoracic Surgery

## 2012-02-18 ENCOUNTER — Other Ambulatory Visit: Payer: Medicare Other

## 2012-02-18 LAB — AFB CULTURE WITH SMEAR (NOT AT ARMC)

## 2012-02-19 ENCOUNTER — Encounter: Payer: Medicare Other | Admitting: Cardiothoracic Surgery

## 2012-02-19 ENCOUNTER — Other Ambulatory Visit (INDEPENDENT_AMBULATORY_CARE_PROVIDER_SITE_OTHER): Payer: Medicare Other

## 2012-02-19 ENCOUNTER — Ambulatory Visit
Admission: RE | Admit: 2012-02-19 | Discharge: 2012-02-19 | Disposition: A | Discharge status: Home / Self Care | Payer: Medicare Other | Source: Ambulatory Visit | Attending: Cardiothoracic Surgery | Admitting: Cardiothoracic Surgery

## 2012-02-19 DIAGNOSIS — J869 Pyothorax without fistula: Secondary | ICD-10-CM

## 2012-02-19 DIAGNOSIS — E876 Hypokalemia: Secondary | ICD-10-CM

## 2012-02-19 LAB — BASIC METABOLIC PANEL
BUN: 6 mg/dL (ref 6–23)
Calcium: 8.3 mg/dL — ABNORMAL LOW (ref 8.4–10.5)
GFR: 138.15 mL/min (ref >60.00)
Glucose, Bld: 99 mg/dL (ref 70–99)
Potassium: 3.7 mEq/L (ref 3.5–5.1)
Sodium: 137 mEq/L (ref 135–145)

## 2012-02-24 ENCOUNTER — Other Ambulatory Visit: Payer: Self-pay | Admitting: *Deleted

## 2012-02-24 MED ORDER — INDOMETHACIN 25 MG PO CAPS: 25.0000 mg | ORAL_CAPSULE | Freq: Two times a day (BID) | ORAL | Status: DC

## 2012-02-24 NOTE — Telephone Encounter (Signed)
Patient needs order for walker with set on it can you please give written order for this.

## 2012-02-25 NOTE — Telephone Encounter (Signed)
Patient advised rx ready.

## 2012-03-01 ENCOUNTER — Other Ambulatory Visit: Payer: Self-pay | Admitting: Cardiothoracic Surgery

## 2012-03-01 DIAGNOSIS — J869 Pyothorax without fistula: Secondary | ICD-10-CM

## 2012-03-03 ENCOUNTER — Other Ambulatory Visit: Payer: Self-pay | Admitting: Cardiothoracic Surgery

## 2012-03-03 ENCOUNTER — Encounter: Payer: Self-pay | Admitting: Cardiothoracic Surgery

## 2012-03-03 ENCOUNTER — Ambulatory Visit (INDEPENDENT_AMBULATORY_CARE_PROVIDER_SITE_OTHER): Payer: Medicare Other | Admitting: Cardiothoracic Surgery

## 2012-03-03 VITALS — BP 138/77 | HR 90 | Temp 97.0°F | Resp 18 | Ht 75.0 in | Wt 179.0 lb

## 2012-03-03 DIAGNOSIS — D381 Neoplasm of uncertain behavior of trachea, bronchus and lung: Secondary | ICD-10-CM

## 2012-03-03 DIAGNOSIS — J869 Pyothorax without fistula: Secondary | ICD-10-CM

## 2012-03-03 DIAGNOSIS — Z09 Encounter for follow-up examination after completed treatment for conditions other than malignant neoplasm: Secondary | ICD-10-CM

## 2012-03-03 NOTE — Progress Notes (Signed)
[address]            [name] [address]          [phone]         PCP is Hannah Beat, MD Referring Provider is Hannah Beat, MD  Chief Complaint  Patient presents with  . Routine Post Op    2 week f/u with CXR, empyema    HPI: This 78 year old African American male has a long history of alcoholism and is still consuming alcohol. He's had at least 3 document syncopal episodes in the last 6 weeks. He has a history of arthritis. He has developed increasing weakness, weight loss, and dyspnea. CT scan of the chest showed a loculated right pleural effusion. His white count was elevated. He was seen by Dr. Levonne Spiller young and a thoracentesis was done which revealed increased WBCs and loculation. He was referred for possible drainage of an empyema. He also has a history of recent rib fractures on the right so this could be an infected hemothorax. he quit smoking several years ago. He gets around with a walker. He is complaining of increasing weakness. CT scan of the chest also shows a left lingular 2 cm lesion that could be an early lung cancer. Chest x-ray 8 months ago did not show the effusion or the lesion. Dr. Edwyna Shell had admitted the patient for drainage of the right effusion however found at the time of admission that he was severely anemic and a Jehovah's Witness refusing blood chance transfusion. Ultimately a radiology placed drainage catheter in the right chest was decided on. The patient now is been at home for approximately a week with IV antibiotics through a PICC line and with continued drainage of a right empyema tube. He seems to be improving slightly but still has significant lower extremity edema. He denies fevers or chills  Patient has some night sweats, no fever has completed course of antibiotics . The PICC line has been removed.    Past Medical History  Diagnosis Date  . Anemia     NOS  . Arthritis     . Gout   . Hyperlipidemia   . Hypertension   . GERD (gastroesophageal reflux disease)   . Elevated PSA     multiple times, refused urology eval [facility] notes)  . ETOH abuse   . Medically noncompliant     noncompliant with follow ups, labs.  . Alcoholism 06/25/2011    Past Surgical History  Procedure Date  . Lipoma excision      Social History History  Substance Use Topics  . Smoking status: Former Smoker    Types: Cigarettes    Quit date: 07/20/1974  . Smokeless tobacco: Former Neurosurgeon   Comment: Remote- quit 45 years ago.  . Alcohol Use: 0.0 oz/week     rarely    Current Outpatient Prescriptions  Medication Sig Dispense Refill  . COLCRYS 0.6 MG tablet Take 0.6 mg by mouth daily.       . diphenhydrAMINE (BENADRYL) 25 mg capsule Take 1 capsule (25 mg total) by mouth every 4 (four) hours as needed for itching.  30 capsule    . folic acid (FOLVITE) 1 MG tablet Take  1 tablet (1 mg total) by mouth daily.  30 tablet  3  . furosemide (LASIX) 40 MG tablet Take 40 mg by mouth daily.      Marland Kitchen HYDROcodone-acetaminophen (VICODIN) 5-500 MG per tablet Take 1 tablet by mouth every 6 (six) hours as needed. For pain  60 tablet  3  . ibuprofen (ADVIL,MOTRIN) 200 MG tablet Take 400-600 mg by mouth every 6 (six) hours as needed. For pain      . indomethacin (INDOCIN) 25 MG capsule Take 1 capsule (25 mg total) by mouth 2 (two) times daily with a meal.  60 capsule  6  . iron polysaccharides (NIFEREX) 150 MG capsule Take 1 capsule (150 mg total) by mouth daily.  30 capsule  3  . Multiple Vitamin (MULTIVITAMIN WITH MINERALS) TABS Take 1 tablet by mouth daily.      . potassium chloride SA (KLOR-CON M20) 20 MEQ tablet Take 1 tablet (20 mEq total) by mouth daily.  30 tablet  5  . thiamine 100 MG tablet Take 1 tablet (100 mg total) by mouth daily.  30 tablet  3    Allergies  Allergen Reactions  . Ace Inhibitors Other (See Comments)    REACTION: Angioedema  . Lisinopril Swelling and Other (See  Comments)    REACTION: Facial Swelling    Review of Systems  Constitutional: Positive for fever, activity change, appetite change, fatigue and unexpected weight change.  HENT: Negative.   Eyes: Negative.   Respiratory: Positive for shortness of breath and wheezing.   Cardiovascular: Positive for palpitations and leg swelling.  Gastrointestinal: Positive for abdominal pain and abdominal distention.  Genitourinary: Positive for dysuria and difficulty urinating.  Musculoskeletal: Positive for myalgias, back pain, joint swelling, arthralgias and gait problem.  Skin: Negative.   Neurological: Positive for dizziness, syncope, weakness, light-headedness and numbness.  Hematological: Negative.   Psychiatric/Behavioral: Negative.     BP 138/77  Pulse 90  Temp 97 F (36.1 C) (Oral)  Resp 18  Ht 6\' 3"  (1.905 m)  Wt 179 lb (81.194 kg)  BMI 22.37 kg/m2  SpO2 97% Physical Exam  Constitutional: He is oriented to person, place, and time.   HENT:  Head: Normocephalic and atraumatic.  Right Ear: External ear normal.  Left Ear: External ear normal.  Mouth/Throat: Oropharynx is clear and moist.  Eyes: Conjunctivae and EOM are normal. Pupils are equal, round, and reactive to light.  Neck: Normal range of motion. Neck supple. No tracheal deviation present. No thyromegaly present.  Cardiovascular: S1 normal and S2 normal.  Systolic murmur is present with a grade of 2/6  Pulmonary/Chest: Tachypnea noted. Breath sounds improved Abdominal: Soft. Bowel sounds are normal. He exhibits no mass. There is no tenderness. There is no rebound.  Musculoskeletal: He exhibits edema and tenderness.  Neurological: He is alert and oriented to person, place, and time. He has normal reflexes. A cranial nerve deficit is present.  Skin: Skin is warm and dry.  Psychiatric: He has a normal mood and affect. His behavior is normal. Judgment and thought content normal.    Dg Chest 2 View  02/19/2012  *RADIOLOGY REPORT*   Clinical Data: Empyema  CHEST - 2 VIEW  Comparison: 02/04/2012  Findings: Right PICC removed.  Right chest tube removed.  Loculated right pleural effusion and basilar heterogeneous opacities stable. Left lung clear.  No pneumothorax.  IMPRESSION: Chest tube removal without pneumothorax.  Stable right loculated pleural effusion.  Original Report Authenticated By: Donavan Burnet, M.D.  Dg Chest 2 View  02/04/2012  *RADIOLOGY REPORT*  Clinical Data: History of rib fractures, shortness of breath, follow-up  CHEST - 2 VIEW  Comparison: Chest x-ray of 01/28/2012  Findings: The right upper extremity PICC line remains in the mid lower SVC.  Pleural and parenchymal opacity at the right lung base is stable.  There does appear to be a pleurex catheter at the right lung base.  The left lung is clear.  Heart size is stable.  IMPRESSION:  1.  PICC line tip in lower SVC. 2.  No change in pleural and parenchymal opacity at the right lung base with a pleurex catheter remaining.  Original Report Authenticated By: Juline Patch, M.D.   Impression:  Marked  improvement in the appearance of his infected right chest.  Will get follow up ct of the chest both to evaluate the rt empyema and left lung nodule on previous ct Mya need left lung bx  Delight Ovens MD  Beeper [phone] Office [phone] 03/03/2012 3:14 PM

## 2012-03-03 NOTE — Progress Notes (Signed)
[address]            [place] [address]          [phone]        PCP is Hannah Beat, MD Referring Provider is Hannah Beat, MD  Chief Complaint  Patient presents with  . Routine Post Op    2 week f/u with CXR, empyema    HPI: This 78 year old African American male has a long history of alcoholism and is still consuming alcohol. He's had at least 3 document syncopal episodes in the last 6 weeks. He has a history of arthritis. He has developed increasing weakness, weight loss, and dyspnea. CT scan of the chest showed a loculated right pleural effusion. His white count was elevated. He was seen by Dr. Levonne Spiller young and a thoracentesis was done which revealed increased WBCs and loculation. He was referred for possible drainage of an empyema. He also has a history of recent rib fractures on the right so this could be an infected hemothorax. he quit smoking several years ago. He gets around with a walker. He is complaining of increasing weakness. CT scan of the chest also shows a left lingular 2 cm lesion that could be an early lung cancer. Chest x-ray 8 months ago did not show the effusion or the lesion. Dr. Edwyna Shell had admitted the patient for drainage of the right effusion however found at the time of admission that he was severely anemic and a Jehovah's Witness refusing blood chance transfusion. Ultimately a radiology placed drainage catheter in the right chest was decided on. The patient now is been at home for approximately a week with IV antibiotics through a PICC line and with continued drainage of a right empyema tube. He seems to be improving slightly but still has significant lower extremity edema. He denies fevers or chills  Since last week he has had no fever or chills, continues on IV antibiotics through the PICC line. He has had very minimal drainage from the right chest tube.   Past Medical History  Diagnosis Date  .  Anemia     NOS  . Arthritis   . Gout   . Hyperlipidemia   . Hypertension   . GERD (gastroesophageal reflux disease)   . Elevated PSA     multiple times, refused urology eval [facility] notes)  . ETOH abuse   . Medically noncompliant     noncompliant with follow ups, labs.  . Alcoholism 06/25/2011    Past Surgical History  Procedure Date  . Lipoma excision      Social History History  Substance Use Topics  . Smoking status: Former Smoker    Types: Cigarettes    Quit date: 07/20/1974  . Smokeless tobacco: Former Neurosurgeon   Comment: Remote- quit 45 years ago.  . Alcohol Use: 0.0 oz/week     rarely    Current Outpatient Prescriptions  Medication Sig Dispense Refill  . COLCRYS 0.6 MG tablet Take 0.6 mg by mouth daily.       . diphenhydrAMINE (BENADRYL) 25 mg capsule Take 1 capsule (25 mg total) by mouth every 4 (four) hours as needed for itching.  30 capsule    . folic acid (FOLVITE) 1 MG tablet Take 1 tablet (1 mg total) by mouth daily.  30  tablet  3  . furosemide (LASIX) 40 MG tablet Take 40 mg by mouth daily.      Marland Kitchen HYDROcodone-acetaminophen (VICODIN) 5-500 MG per tablet Take 1 tablet by mouth every 6 (six) hours as needed. For pain  60 tablet  3  . ibuprofen (ADVIL,MOTRIN) 200 MG tablet Take 400-600 mg by mouth every 6 (six) hours as needed. For pain      . indomethacin (INDOCIN) 25 MG capsule Take 1 capsule (25 mg total) by mouth 2 (two) times daily with a meal.  60 capsule  6  . iron polysaccharides (NIFEREX) 150 MG capsule Take 1 capsule (150 mg total) by mouth daily.  30 capsule  3  . Multiple Vitamin (MULTIVITAMIN WITH MINERALS) TABS Take 1 tablet by mouth daily.      . potassium chloride SA (KLOR-CON M20) 20 MEQ tablet Take 1 tablet (20 mEq total) by mouth daily.  30 tablet  5  . thiamine 100 MG tablet Take 1 tablet (100 mg total) by mouth daily.  30 tablet  3    Allergies  Allergen Reactions  . Ace Inhibitors Other (See Comments)    REACTION: Angioedema  . Lisinopril  Swelling and Other (See Comments)    REACTION: Facial Swelling    Review of Systems  Constitutional: Positive for fever, activity change, appetite change, fatigue and unexpected weight change.  HENT: Negative.   Eyes: Negative.   Respiratory: Positive for shortness of breath and wheezing.   Cardiovascular: Positive for palpitations and leg swelling.  Gastrointestinal: Positive for abdominal pain and abdominal distention.  Genitourinary: Positive for dysuria and difficulty urinating.  Musculoskeletal: Positive for myalgias, back pain, joint swelling, arthralgias and gait problem.  Skin: Negative.   Neurological: Positive for dizziness, syncope, weakness, light-headedness and numbness.  Hematological: Negative.   Psychiatric/Behavioral: Negative.     BP 138/77  Pulse 90  Temp 97 F (36.1 C) (Oral)  Resp 18  Ht 6\' 3"  (1.905 m)  Wt 179 lb (81.194 kg)  BMI 22.37 kg/m2  SpO2 97% Physical Exam  Constitutional: He is oriented to person, place, and time.   HENT:  Head: Normocephalic and atraumatic.  Right Ear: External ear normal.  Left Ear: External ear normal.  Mouth/Throat: Oropharynx is clear and moist.  Eyes: Conjunctivae and EOM are normal. Pupils are equal, round, and reactive to light.  Neck: Normal range of motion. Neck supple. No tracheal deviation present. No thyromegaly present.  Cardiovascular: S1 normal and S2 normal.  An irregular rhythm present.  Extrasystoles are present. Tachycardia present.   Murmur heard.  Systolic murmur is present with a grade of 2/6  Pulmonary/Chest: Tachypnea noted. He has decreased breath sounds in the right middle field and the right lower field. He has wheezes in the right middle field. He exhibits tenderness.  Abdominal: Soft. Bowel sounds are normal. He exhibits no mass. There is no tenderness. There is no rebound.  Musculoskeletal: He exhibits edema and tenderness.  Neurological: He is alert and oriented to person, place, and time. He  has normal reflexes. A cranial nerve deficit is present.  Skin: Skin is warm and dry.  Psychiatric: He has a normal mood and affect. His behavior is normal. Judgment and thought content normal.    No results found.   Impression:  Some improvement in the appearance of his infected right chest with IV antibiotics and a right chest tube in place. The drainage appears to be decreasing to a very minimal amount over the past week.  The right chest tube was removed in the office today, the patient was given wound care instructions. We will see him back in the office in 2 weeks with a followup chest x-ray  Delight Ovens MD  Beeper [phone] Office [phone] 03/03/2012 3:06 PM

## 2012-03-23 ENCOUNTER — Ambulatory Visit
Admission: RE | Admit: 2012-03-23 | Discharge: 2012-03-23 | Disposition: A | Discharge status: Home / Self Care | Payer: Medicare Other | Source: Ambulatory Visit | Attending: Cardiothoracic Surgery | Admitting: Cardiothoracic Surgery

## 2012-03-23 DIAGNOSIS — D381 Neoplasm of uncertain behavior of trachea, bronchus and lung: Secondary | ICD-10-CM

## 2012-03-23 DIAGNOSIS — J869 Pyothorax without fistula: Secondary | ICD-10-CM

## 2012-03-24 ENCOUNTER — Ambulatory Visit (INDEPENDENT_AMBULATORY_CARE_PROVIDER_SITE_OTHER): Payer: Medicare Other | Admitting: Cardiothoracic Surgery

## 2012-03-24 ENCOUNTER — Encounter: Payer: Self-pay | Admitting: Cardiothoracic Surgery

## 2012-03-24 ENCOUNTER — Encounter: Payer: Self-pay | Admitting: Family Medicine

## 2012-03-24 ENCOUNTER — Ambulatory Visit (INDEPENDENT_AMBULATORY_CARE_PROVIDER_SITE_OTHER): Payer: Medicare Other | Admitting: Family Medicine

## 2012-03-24 VITALS — BP 174/98 | HR 50 | Temp 98.1°F | Resp 14 | Wt 171.8 lb

## 2012-03-24 VITALS — BP 171/93 | HR 82 | Resp 16 | Ht 75.0 in | Wt 179.0 lb

## 2012-03-24 DIAGNOSIS — R911 Solitary pulmonary nodule: Secondary | ICD-10-CM

## 2012-03-24 DIAGNOSIS — Z09 Encounter for follow-up examination after completed treatment for conditions other than malignant neoplasm: Secondary | ICD-10-CM

## 2012-03-24 DIAGNOSIS — D649 Anemia, unspecified: Secondary | ICD-10-CM

## 2012-03-24 DIAGNOSIS — J869 Pyothorax without fistula: Secondary | ICD-10-CM

## 2012-03-24 DIAGNOSIS — N289 Disorder of kidney and ureter, unspecified: Secondary | ICD-10-CM

## 2012-03-24 LAB — CBC WITH DIFFERENTIAL/PLATELET
Basophils Absolute: 0 10*3/uL (ref 0.0–0.1)
Eosinophils Relative: 2 % (ref 0.0–5.0)
HCT: 31.8 % — ABNORMAL LOW (ref 39.0–52.0)
Hemoglobin: 10.4 g/dL — ABNORMAL LOW (ref 13.0–17.0)
Lymphocytes Relative: 30.1 % (ref 12.0–46.0)
Monocytes Relative: 13.8 % — ABNORMAL HIGH (ref 3.0–12.0)
Platelets: 300 10*3/uL (ref 150.0–400.0)
RDW: 22.3 % — ABNORMAL HIGH (ref 11.5–14.6)
WBC: 8.5 10*3/uL (ref 4.5–10.5)

## 2012-03-24 LAB — BASIC METABOLIC PANEL
CO2: 28 mEq/L (ref 19–32)
Chloride: 104 mEq/L (ref 96–112)
Glucose, Bld: 86 mg/dL (ref 70–99)
Sodium: 140 mEq/L (ref 135–145)

## 2012-03-24 NOTE — Progress Notes (Signed)
[address]            [name] [address]          [phone]           PCP is Hannah Beat, MD Referring Provider is Hannah Beat, MD  Chief Complaint  Patient presents with  . Routine Post Op    3 week f/u with Chest CT Super D, eval right empyema and left lung nodule     HPI: This 78 year old African American male has a long history of alcoholism and is still consuming alcohol. He's not had any more syncopal episodes in the last 6 weeks. He has a history of arthritis. He has developed increasing weakness, weight loss, and dyspnea. CT scan of the chest showed a loculated right pleural effusion. His white count was elevated. He was seen by Dr. Levonne Spiller young and a thoracentesis was done which revealed increased WBCs and loculation. He was referred for possible drainage of an empyema. He also has a history of recent rib fractures on the right so this could be an infected hemothorax. he quit smoking several years ago. He gets around with a walker. He is complaining of increasing weakness. CT scan of the chest also shows a left lingular 2 cm lesion that could be an early lung cancer. Chest x-ray 8 months ago did not show the effusion or the lesion. Dr. Edwyna Shell had admitted the patient for drainage of the right effusion however found at the time of admission that he was severely anemic and a Jehovah's Witness refusing blood chance transfusion. Ultimately a radiology placed drainage catheter in the right chest was decided on.    He feels moch better, ambulating with only cane now, says he has improved a lot in past 2 weeks. He denies fevers or chills  He  has completed course of antibiotics . The PICC line has been removed.    Past Medical History  Diagnosis Date  . Anemia     NOS  . Arthritis   . Gout   . Hyperlipidemia   . Hypertension   . GERD (gastroesophageal reflux disease)   . Elevated PSA     multiple  times, refused urology eval [facility] notes)  . ETOH abuse   . Medically noncompliant     noncompliant with follow ups, labs.  . Alcoholism 06/25/2011    Past Surgical History  Procedure Date  . Lipoma excision      Social History History  Substance Use Topics  . Smoking status: Former Smoker    Types: Cigarettes    Quit date: 07/20/1974  . Smokeless tobacco: Former Neurosurgeon   Comment: Remote- quit 45 years ago.  . Alcohol Use: 0.0 oz/week     rarely    Current Outpatient Prescriptions  Medication Sig Dispense Refill  . COLCRYS 0.6 MG tablet Take 0.6 mg by mouth daily.       . folic acid (FOLVITE) 1 MG tablet Take 1 tablet (1 mg total) by mouth daily.  30 tablet  3  . furosemide (LASIX) 40 MG tablet Take 40 mg by mouth daily.      Marland Kitchen HYDROcodone-acetaminophen (VICODIN) 5-500 MG per tablet Take 1 tablet  by mouth every 6 (six) hours as needed. For pain  60 tablet  3  . ibuprofen (ADVIL,MOTRIN) 200 MG tablet Take 400-600 mg by mouth every 6 (six) hours as needed. For pain      . indomethacin (INDOCIN) 25 MG capsule Take 1 capsule (25 mg total) by mouth 2 (two) times daily with a meal.  60 capsule  6  . iron polysaccharides (NIFEREX) 150 MG capsule Take 1 capsule (150 mg total) by mouth daily.  30 capsule  3  . Multiple Vitamin (MULTIVITAMIN WITH MINERALS) TABS Take 1 tablet by mouth daily.      . potassium chloride SA (KLOR-CON M20) 20 MEQ tablet Take 1 tablet (20 mEq total) by mouth daily.  30 tablet  5  . thiamine 100 MG tablet Take 1 tablet (100 mg total) by mouth daily.  30 tablet  3    Allergies  Allergen Reactions  . Ace Inhibitors Other (See Comments)    REACTION: Angioedema  . Lisinopril Swelling and Other (See Comments)    REACTION: Facial Swelling    Review of Systems  Constitutional: Positive for fever, activity change, appetite change has improved, fatigue in past now resolved and  weight change has stablised.  HENT: Negative.   Eyes: Negative.   Respiratory:  Positive for shortness of breath and wheezing.  Both improving Cardiovascular: Positive for palpitations and leg swelling. Leg swelling better, chronic numbness in feet Gastrointestinal: Positive for abdominal pain and abdominal distention.  Genitourinary: Positive for dysuria and difficulty urinating.  Musculoskeletal: Positive for myalgias, back pain, joint swelling, arthralgias and gait problem.  Skin: Negative.   Neurological: Positive for dizziness, syncope, weakness, light-headedness and numbness.  Hematological: Negative.   Psychiatric/Behavioral: Negative.     BP 171/93  Pulse 82  Resp 16  Ht 6\' 3"  (1.905 m)  Wt 179 lb (81.194 kg)  BMI 22.37 kg/m2  SpO2 98% Physical Exam  Constitutional: He is oriented to person, place, and time.   HENT:  Head: Normocephalic and atraumatic.  Right Ear: External ear normal.  Left Ear: External ear normal.  Mouth/Throat: Oropharynx is clear and moist.  Eyes: Conjunctivae and EOM are normal. Pupils are equal, round, and reactive to light.  Neck: Normal range of motion. Neck supple. No tracheal deviation present. No thyromegaly present.  Cardiovascular: S1 normal and S2 normal.  Systolic murmur is present with a grade of 2/6  Pulmonary/Chest: . Breath sounds improved Abdominal: Soft. Bowel sounds are normal. He exhibits no mass. There is no tenderness. There is no rebound.  Musculoskeletal: He exhibits no  edema .  Neurological: He is alert and oriented to person, place, and time. He has normal reflexes.  Skin: Skin is warm and dry.  Psychiatric: He has a normal mood and affect. His behavior is normal. Judgment and thought content normal.   Ct Super D Chest Wo Contrast  03/23/2012  *RADIOLOGY REPORT*  Clinical Data:  History of empyema.  Right thoracostomy tube removed.  Ex-smoker.  CT CHEST WITHOUT CONTRAST  Technique:  Multidetector CT imaging of the chest was performed using thin slice collimation for electromagnetic bronchoscopy planning  purposes, without intravenous contrast.  Comparison:  Chest radiograph 02/19/2012 and CT chest 01/11/2012.  Findings:  No pathologically enlarged mediastinal or axillary lymph nodes.  Hilar regions are difficult to definitively evaluate without IV contrast.  Heart size normal.  No pericardial effusion.  Small right pleural effusion appears partially loculated and contains some areas of high attenuation, suggesting chronicity and/or  talc pleurodesis.  Scarring is seen in the right lung, predominantly in the right middle and right lower lobes, with minimal involvement of the right upper lobe.  Minimal scarring in the lingula.  4 mm left lower lobe nodule (image 36) is unchanged. Airway is unremarkable.  Incidental imaging of the upper abdomen shows a low attenuation lesion in the right hepatic lobe, measuring 1.7 x 3.0 cm, which is progressive from 01/11/2012 and new from 12/30/2011.  A 2 cm low attenuation lesion is seen in the right kidney, as before.  Nodular thickening of the left adrenal gland, stable.  No worrisome lytic or sclerotic lesions.  IMPRESSION:  1.  Small residual partially loculated right pleural fluid collection with scarring and mild volume loss in the adjacent right lung. 2.  Resolved lobular consolidation in the lingula. 3.  Low attenuation lesion in the right hepatic lobe is progressive from 01/11/2012 and new from 12/30/2011.  An abscess could have this appearance. These results will be called to the ordering clinician or representative by the Radiologist Assistant, and communication documented in the PACS Dashboard. 4.  Stable nodular thickening of the left adrenal gland.   Original Report Authenticated By: Reyes Ivan, M.D.    Impression:  The lesion in the left lower lobe, superior segment and lingula are both gone on ct today, 3.5 mm nodule on left, rt chest much improved Issue of liver lesion evident, but no clinical signs of liver abscess, will follow up withrepeat scan 4  months.   Delight Ovens MD  Beeper [phone] Office [phone] 03/24/2012 9:45 AM

## 2012-03-24 NOTE — Patient Instructions (Signed)
CT looks better, mass in left lung Dr Maple Hudson worried about is now gone Will get repeat CT of chest 4 months

## 2012-03-24 NOTE — Progress Notes (Signed)
Nature conservation officer at [facility] [address] [place] [address] Phone: [phone] Fax: [phone]  Date:  03/24/2012   Name:  Bob Wilson   DOB:  [birth year]   MRN:  [id number]  PCP:  Hannah Beat, MD    Chief Complaint: Follow-up   History of Present Illness:  Bob Wilson is a 78 y.o. very pleasant male patient who presents with the following:  Recent complex medical history in a patient with an infected right-sided empyema, with recent chest tube removal by cardio thoracic surgery, he has been having a PICC line with IV antibiotics - now d/c, and he also has a known 2 cm left lingular lesion has not been biopsied at this point due to his instability, hospitalization, infection, and he also had very significant lower extremity edema and shortness of breath on his last office visit. We increased his Lasix to 20 mg by mouth twice a day, and he has had some improve diuresis.  No HF on Echo 12/2011.  He is feeling much better now, but has resumed drinking about a half-gallon of gin a week. Much better, minimal pain, breathing better.   02/01/2012 OV The patient is well known with a history of right-sided infected empyema, status post chest tube placement and inpatient hospital stay. He also has a known 2 cm left lingular lesion it has not been biopsied up to this point due to his unstable status, hospitalization, and there is some concern for potential cancer.  7/11/CVTS OV: He's had at least 3 document syncopal episodes in the last 6 weeks. He has a history of arthritis. He has developed increasing weakness, weight loss, and dyspnea. CT scan of the chest showed a loculated right pleural effusion. His white count was elevated. He was seen by Dr. Levonne Spiller young and a thoracentesis was done which revealed increased WBCs and loculation. He was referred for possible drainage of an empyema. He also has a history of recent rib fractures on the right so this could be an infected  hemothorax. he quit smoking several years ago. He gets around with a walker. He is complaining of increasing weakness. CT scan of the chest also shows a left lingular 2 cm lesion that could be an early lung cancer. Chest x-ray 8 months ago did not show the effusion or the lesion. Dr. Edwyna Shell had admitted the patient for drainage of the right effusion however found at the time of admission that he was severely anemic and a Jehovah's Witness refusing blood chance transfusion. Ultimately a radiology placed drainage catheter in the right chest was decided on. The patient now is been at home for approximately a week with IV antibiotics through a PICC line and with continued drainage of a right empyema tube. He seems to be improving slightly but still has significant lower extremity edema. He denies fevers or chills  He's had at least 3 document syncopal episodes in the last 6 weeks. He has a history of arthritis. He has developed increasing weakness, weight loss, and dyspnea. CT scan of the chest showed a loculated right pleural effusion. His white count was elevated. He was seen by Dr. Levonne Spiller young and a thoracentesis was done which revealed increased WBCs and loculation. He was referred for possible drainage of an empyema. He also has a history of recent rib fractures on the right so this could be an infected hemothorax. he quit smoking several years ago. He gets around with a walker. He is complaining of increasing  weakness. CT scan of the chest also shows a left lingular 2 cm lesion that could be an early lung cancer. Chest x-ray 8 months ago did not show the effusion or the lesion. Dr. Edwyna Shell had admitted the patient for drainage of the right effusion however found at the time of admission that he was severely anemic and a Jehovah's Witness refusing blood chance transfusion. Ultimately a radiology placed drainage catheter in the right chest was decided on. The patient now is been at home for approximately a week  with IV antibiotics through a PICC line and with continued drainage of a right empyema tube. He seems to be improving slightly but still has significant lower extremity edema. He denies fevers or chills   Patient Active Problem List  Diagnosis  . HYPERLIPIDEMIA  . GOUT  . ANEMIA-NOS  . BENIGN PAROXYSMAL POSITIONAL VERTIGO  . HYPERTENSION, ESSENTIAL NOS  . GERD  . DEGENERATIVE JOINT DISEASE, KNEE  . HYPONATREMIA  . LEUKOCYTOSIS  . INGUINAL HERNIA, RIGHT  . PARESTHESIA  . TRANSAMINASES, SERUM, ELEVATED  . PROSTATE SPECIFIC ANTIGEN, ELEVATED  . SOB (shortness of breath) on exertion  . Alcoholism  . Medically noncompliant  . Empyema of pleura  . Nodule of left lung  . Empyema  . Edema    Past Medical History  Diagnosis Date  . Anemia     NOS  . Arthritis   . Gout   . Hyperlipidemia   . Hypertension   . GERD (gastroesophageal reflux disease)   . Elevated PSA     multiple times, refused urology eval [facility] notes)  . ETOH abuse   . Medically noncompliant     noncompliant with follow ups, labs.  . Alcoholism 06/25/2011    Past Surgical History  Procedure Date  . Lipoma excision     History  Substance Use Topics  . Smoking status: Former Smoker    Types: Cigarettes    Quit date: 07/20/1974  . Smokeless tobacco: Former Neurosurgeon   Comment: Remote- quit 45 years ago.  . Alcohol Use: 0.0 oz/week     rarely    No family history on file.  Allergies  Allergen Reactions  . Ace Inhibitors Other (See Comments)    REACTION: Angioedema  . Lisinopril Swelling and Other (See Comments)    REACTION: Facial Swelling    Medication list has been reviewed and updated.  Current Outpatient Prescriptions on File Prior to Visit  Medication Sig Dispense Refill  . COLCRYS 0.6 MG tablet Take 0.6 mg by mouth daily.       . folic acid (FOLVITE) 1 MG tablet Take 1 tablet (1 mg total) by mouth daily.  30 tablet  3  . furosemide (LASIX) 40 MG tablet Take 40 mg by mouth daily.      Marland Kitchen  HYDROcodone-acetaminophen (VICODIN) 5-500 MG per tablet Take 1 tablet by mouth every 6 (six) hours as needed. For pain  60 tablet  3  . ibuprofen (ADVIL,MOTRIN) 200 MG tablet Take 400-600 mg by mouth every 6 (six) hours as needed. For pain      . indomethacin (INDOCIN) 25 MG capsule Take 1 capsule (25 mg total) by mouth 2 (two) times daily with a meal.  60 capsule  6  . iron polysaccharides (NIFEREX) 150 MG capsule Take 1 capsule (150 mg total) by mouth daily.  30 capsule  3  . Multiple Vitamin (MULTIVITAMIN WITH MINERALS) TABS Take 1 tablet by mouth daily.      Marland Kitchen  potassium chloride SA (KLOR-CON M20) 20 MEQ tablet Take 1 tablet (20 mEq total) by mouth daily.  30 tablet  5  . thiamine 100 MG tablet Take 1 tablet (100 mg total) by mouth daily.  30 tablet  3    Review of Systems:  Knee pain, died improved. Increase by mouth intake. Improved ambulation and decreased shortness of breath. Improved edema.  Overall, feeling much better.  Physical Examination: Filed Vitals:   03/24/12 0758  BP: 174/98  Pulse: 50  Temp: 98.1 F (36.7 C)  Resp: 14   Filed Vitals:   03/24/12 0758  Weight: 171 lb 12 oz (77.905 kg)   There is no height on file to calculate BMI. Ideal Body Weight:     GEN: WDWN, NAD, Non-toxic, A & O x 3 HEENT: Atraumatic, Normocephalic. Neck supple. No masses, No LAD. Ears and Nose: No external deformity. CV: RRR, No M/G/R. No JVD. No thrill. No extra heart sounds. PULM: BS with occ rhonchi, but overall much improved EXTR: effectively no le edema PSYCH: Normally interactive. Conversant. Not depressed or anxious appearing.  Calm demeanor.    Objective Data: Ct Super D Chest Wo Contrast  03/23/2012  *RADIOLOGY REPORT*  Clinical Data:  History of empyema.  Right thoracostomy tube removed.  Ex-smoker.  CT CHEST WITHOUT CONTRAST  Technique:  Multidetector CT imaging of the chest was performed using thin slice collimation for electromagnetic bronchoscopy planning purposes,  without intravenous contrast.  Comparison:  Chest radiograph 02/19/2012 and CT chest 01/11/2012.  Findings:  No pathologically enlarged mediastinal or axillary lymph nodes.  Hilar regions are difficult to definitively evaluate without IV contrast.  Heart size normal.  No pericardial effusion.  Small right pleural effusion appears partially loculated and contains some areas of high attenuation, suggesting chronicity and/or talc pleurodesis.  Scarring is seen in the right lung, predominantly in the right middle and right lower lobes, with minimal involvement of the right upper lobe.  Minimal scarring in the lingula.  4 mm left lower lobe nodule (image 36) is unchanged. Airway is unremarkable.  Incidental imaging of the upper abdomen shows a low attenuation lesion in the right hepatic lobe, measuring 1.7 x 3.0 cm, which is progressive from 01/11/2012 and new from 12/30/2011.  A 2 cm low attenuation lesion is seen in the right kidney, as before.  Nodular thickening of the left adrenal gland, stable.  No worrisome lytic or sclerotic lesions.  IMPRESSION:  1.  Small residual partially loculated right pleural fluid collection with scarring and mild volume loss in the adjacent right lung. 2.  Resolved lobular consolidation in the lingula. 3.  Low attenuation lesion in the right hepatic lobe is progressive from 01/11/2012 and new from 12/30/2011.  An abscess could have this appearance. These results will be called to the ordering clinician or representative by the Radiologist Assistant, and communication documented in the PACS Dashboard. 4.  Stable nodular thickening of the left adrenal gland.   Original Report Authenticated By: Reyes Ivan, M.D.      Assessment and Plan:  1. Anemia  CBC with Differential  2. Renal insufficiency  Basic metabolic panel  3. Empyema     Check renal function. Edema is improved  Liver lesion noted.  F/u with CVTS today, and I am interested in Dr. Dennie Maizes opinion regarding  CT of chest.   Orders Today:  Orders Placed This Encounter  Procedures  . CBC with Differential  . Basic metabolic panel    Nicolina Hirt,  MD ,

## 2012-04-09 ENCOUNTER — Other Ambulatory Visit: Payer: Self-pay | Admitting: Family Medicine

## 2012-06-02 ENCOUNTER — Ambulatory Visit (INDEPENDENT_AMBULATORY_CARE_PROVIDER_SITE_OTHER): Payer: Medicare Other | Admitting: Family Medicine

## 2012-06-02 ENCOUNTER — Encounter: Payer: Self-pay | Admitting: Family Medicine

## 2012-06-02 VITALS — BP 130/88 | HR 81 | Temp 98.9°F | Ht 75.0 in | Wt 187.5 lb

## 2012-06-02 DIAGNOSIS — F1020 Alcohol dependence, uncomplicated: Secondary | ICD-10-CM

## 2012-06-02 DIAGNOSIS — I10 Essential (primary) hypertension: Secondary | ICD-10-CM

## 2012-06-02 DIAGNOSIS — J869 Pyothorax without fistula: Secondary | ICD-10-CM

## 2012-06-02 DIAGNOSIS — D509 Iron deficiency anemia, unspecified: Secondary | ICD-10-CM

## 2012-06-02 DIAGNOSIS — R209 Unspecified disturbances of skin sensation: Secondary | ICD-10-CM

## 2012-06-02 NOTE — Progress Notes (Signed)
Nature conservation officer at [facility] [address] [place] [address] Phone: [phone] Fax: [phone]  Date:  06/02/2012   Name:  Bob Wilson   DOB:  Apr 30, 1933   MRN:  [id number] Gender: male Age: 78 y.o.  PCP:  Hannah Beat, MD  Evaluating MD: Hannah Beat, MD   Chief Complaint: Follow-up   History of Present Illness:  Bob Wilson is a 78 y.o. pleasant patient who presents with the following:  complex medical history in a patient with an infected right-sided empyema, with recent chest tube removal by cardio thoracic surgery, he has been having a PICC line with IV antibiotics, he had a lingular L sided lesion with initial concern for potential lung CA, but since resolved after long ABX course.   Significant anemia, but has been taking his iron, overall feeling much better  CBC:    Component Value Date/Time   WBC 8.5 03/24/2012 0837   HGB 10.4* 03/24/2012 0837   HCT 31.8* 03/24/2012 0837   PLT 300.0 03/24/2012 0837   MCV 90.7 03/24/2012 0837   NEUTROABS 4.6 03/24/2012 0837   LYMPHSABS 2.6 03/24/2012 0837   MONOABS 1.2* 03/24/2012 0837   EOSABS 0.2 03/24/2012 0837   BASOSABS 0.0 03/24/2012 0837    He has resumed drinking alcohol again, h/o severe chronic  Alcoholism, says he is drinking a lot less than before - limited mostly by his finances.  Eating strength up Knees, legs and feet are hurting.   Wt Readings from Last 3 Encounters:  06/02/12 187 lb 8 oz (85.049 kg)  03/24/12 179 lb (81.194 kg)  03/24/12 171 lb 12 oz (77.905 kg)   03/24/2012 OV: Recent complex medical history in a patient with an infected right-sided empyema, with recent chest tube removal by cardio thoracic surgery, he has been having a PICC line with IV antibiotics - now d/c, and he also has a known 2 cm left lingular lesion has not been biopsied at this point due to his instability, hospitalization, infection, and he also had very significant lower extremity edema and shortness of breath on his  last office visit. We increased his Lasix to 20 mg by mouth twice a day, and he has had some improve diuresis.  No HF on Echo 12/2011.  He is feeling much better now, but has resumed drinking about a half-gallon of gin a week. Much better, minimal pain, breathing better.   02/01/2012 OV The patient is well known with a history of right-sided infected empyema, status post chest tube placement and inpatient hospital stay. He also has a known 2 cm left lingular lesion it has not been biopsied up to this point due to his unstable status, hospitalization, and there is some concern for potential cancer.  7/11/CVTS OV: He's had at least 3 document syncopal episodes in the last 6 weeks. He has a history of arthritis. He has developed increasing weakness, weight loss, and dyspnea. CT scan of the chest showed a loculated right pleural effusion. His white count was elevated. He was seen by Dr. Levonne Spiller young and a thoracentesis was done which revealed increased WBCs and loculation. He was referred for possible drainage of an empyema. He also has a history of recent rib fractures on the right so this could be an infected hemothorax. he quit smoking several years ago. He gets around with a walker. He is complaining of increasing weakness. CT scan of the chest also shows a left lingular 2 cm lesion that could be  an early lung cancer. Chest x-ray 8 months ago did not show the effusion or the lesion. Dr. Edwyna Shell had admitted the patient for drainage of the right effusion however found at the time of admission that he was severely anemic and a Jehovah's Witness refusing blood chance transfusion. Ultimately a radiology placed drainage catheter in the right chest was decided on. The patient now is been at home for approximately a week with IV antibiotics through a PICC line and with continued drainage of a right empyema tube. He seems to be improving slightly but still has significant lower extremity edema. He denies fevers or  chills  He's had at least 3 document syncopal episodes in the last 6 weeks. He has a history of arthritis. He has developed increasing weakness, weight loss, and dyspnea. CT scan of the chest showed a loculated right pleural effusion. His white count was elevated. He was seen by Dr. Levonne Spiller young and a thoracentesis was done which revealed increased WBCs and loculation. He was referred for possible drainage of an empyema. He also has a history of recent rib fractures on the right so this could be an infected hemothorax. he quit smoking several years ago. He gets around with a walker. He is complaining of increasing weakness. CT scan of the chest also shows a left lingular 2 cm lesion that could be an early lung cancer. Chest x-ray 8 months ago did not show the effusion or the lesion. Dr. Edwyna Shell had admitted the patient for drainage of the right effusion however found at the time of admission that he was severely anemic and a Jehovah's Witness refusing blood chance transfusion. Ultimately a radiology placed drainage catheter in the right chest was decided on. The patient now is been at home for approximately a week with IV antibiotics through a PICC line and with continued drainage of a right empyema tube. He seems to be improving slightly but still has significant lower extremity edema. He denies fevers or chills   Patient Active Problem List  Diagnosis  . HYPERLIPIDEMIA  . GOUT  . ANEMIA-NOS  . BENIGN PAROXYSMAL POSITIONAL VERTIGO  . HYPERTENSION, ESSENTIAL NOS  . GERD  . DEGENERATIVE JOINT DISEASE, KNEE  . HYPONATREMIA  . LEUKOCYTOSIS  . INGUINAL HERNIA, RIGHT  . PARESTHESIA  . TRANSAMINASES, SERUM, ELEVATED  . PROSTATE SPECIFIC ANTIGEN, ELEVATED  . SOB (shortness of breath) on exertion  . Alcoholism  . Medically noncompliant  . Empyema of pleura  . Nodule of left lung  . Empyema  . Edema    Past Medical History  Diagnosis Date  . Anemia     NOS  . Arthritis   . Gout   .  Hyperlipidemia   . Hypertension   . GERD (gastroesophageal reflux disease)   . Elevated PSA     multiple times, refused urology eval [facility] notes)  . ETOH abuse   . Medically noncompliant     noncompliant with follow ups, labs.  . Alcoholism 06/25/2011    Past Surgical History  Procedure Date  . Lipoma excision     History  Substance Use Topics  . Smoking status: Former Smoker    Types: Cigarettes    Quit date: 07/20/1974  . Smokeless tobacco: Former Neurosurgeon     Comment: Remote- quit 45 years ago.  . Alcohol Use: 0.0 oz/week     Comment: rarely    No family history on file.  Allergies  Allergen Reactions  . Ace Inhibitors Other (See Comments)  REACTION: Angioedema  . Lisinopril Swelling and Other (See Comments)    REACTION: Facial Swelling    Medication list has been reviewed and updated.  Outpatient Prescriptions Prior to Visit  Medication Sig Dispense Refill  . COLCRYS 0.6 MG tablet Take 0.6 mg by mouth daily.       . folic acid (FOLVITE) 1 MG tablet Take 1 tablet (1 mg total) by mouth daily.  30 tablet  3  . furosemide (LASIX) 20 MG tablet take 1 tablet by mouth once daily  30 tablet  6  . HYDROcodone-acetaminophen (VICODIN) 5-500 MG per tablet Take 1 tablet by mouth every 6 (six) hours as needed. For pain  60 tablet  3  . ibuprofen (ADVIL,MOTRIN) 200 MG tablet Take 400-600 mg by mouth every 6 (six) hours as needed. For pain      . iron polysaccharides (NIFEREX) 150 MG capsule Take 1 capsule (150 mg total) by mouth daily.  30 capsule  3  . Multiple Vitamin (MULTIVITAMIN WITH MINERALS) TABS Take 1 tablet by mouth daily.      . potassium chloride SA (KLOR-CON M20) 20 MEQ tablet Take 1 tablet (20 mEq total) by mouth daily.  30 tablet  5  . thiamine 100 MG tablet Take 1 tablet (100 mg total) by mouth daily.  30 tablet  3  . [DISCONTINUED] furosemide (LASIX) 40 MG tablet Take 40 mg by mouth daily.       Last reviewed on 06/02/2012  3:26 PM by Consuello Masse,  CMA  Review of Systems:  Knee pain B, weakness improving, no cough or chest pain, mild le edema  Physical Examination: Filed Vitals:   06/02/12 1523  BP: 130/88  Pulse: 81  Temp: 98.9 F (37.2 C)  TempSrc: Oral  Height: 6\' 3"  (1.905 m)  Weight: 187 lb 8 oz (85.049 kg)  SpO2: 97%    Body mass index is 23.44 kg/(m^2). Ideal Body Weight: Weight in (lb) to have BMI = 25: 199.6    GEN: WDWN, NAD, Non-toxic, A & O x 3 HEENT: Atraumatic, Normocephalic. Neck supple. No masses, No LAD. Ears and Nose: No external deformity. CV: RRR, No M/G/R. No JVD. No thrill. No extra heart sounds. PULM: CTA B, no wheezes, crackles, rhonchi. No retractions. No resp. distress. No accessory muscle use. EXTR: No c/c. Tr le edema NEURO Normal gait.  PSYCH: Normally interactive. Conversant. Not depressed or anxious appearing.  Calm demeanor.    Assessment and Plan:  1. Iron deficiency anemia - feeling better, recheck, cont iron CBC with Differential  2. Empyema of pleura - resolved   3. HYPERTENSION, ESSENTIAL NOS stable   4. PARESTHESIA : challenging, neuropathy, ? ETOH induced, with his severe alcoholism I don't want to put him on a neuropathic agent or tca   5. Alcoholism       Orders Today:  Orders Placed This Encounter  Procedures  . CBC with Differential    Updated Medication List: (Includes new medications, updates to list, dose adjustments) Meds ordered this encounter  Medications  . indomethacin (INDOCIN) 25 MG capsule    Sig:     Medications Discontinued: Medications Discontinued During This Encounter  Medication Reason  . furosemide (LASIX) 40 MG tablet Error     Hannah Beat, MD

## 2012-06-03 ENCOUNTER — Encounter: Payer: Self-pay | Admitting: *Deleted

## 2012-06-03 LAB — CBC WITH DIFFERENTIAL/PLATELET
Basophils Relative: 3.5 % — ABNORMAL HIGH (ref 0.0–3.0)
Eosinophils Absolute: 0.5 10*3/uL (ref 0.0–0.7)
HCT: 34.4 % — ABNORMAL LOW (ref 39.0–52.0)
Hemoglobin: 11.5 g/dL — ABNORMAL LOW (ref 13.0–17.0)
Lymphs Abs: 2.2 10*3/uL (ref 0.7–4.0)
MCHC: 33.5 g/dL (ref 30.0–36.0)
MCV: 97.3 fl (ref 78.0–100.0)
Monocytes Absolute: 0.8 10*3/uL (ref 0.1–1.0)
Neutro Abs: 4.3 10*3/uL (ref 1.4–7.7)
RBC: 3.53 Mil/uL — ABNORMAL LOW (ref 4.22–5.81)
RDW: 15.6 % — ABNORMAL HIGH (ref 11.5–14.6)

## 2012-07-11 ENCOUNTER — Other Ambulatory Visit: Payer: Self-pay | Admitting: *Deleted

## 2012-07-11 DIAGNOSIS — D381 Neoplasm of uncertain behavior of trachea, bronchus and lung: Secondary | ICD-10-CM

## 2012-07-11 DIAGNOSIS — J869 Pyothorax without fistula: Secondary | ICD-10-CM

## 2012-07-12 ENCOUNTER — Other Ambulatory Visit: Payer: Self-pay | Admitting: *Deleted

## 2012-07-12 DIAGNOSIS — J869 Pyothorax without fistula: Secondary | ICD-10-CM

## 2012-07-25 ENCOUNTER — Telehealth: Payer: Self-pay | Admitting: Family Medicine

## 2012-07-25 NOTE — Telephone Encounter (Signed)
Patient Information:  Caller Name: Pattricia Boss  Phone: [phone]  Patient: Bob, Wilson  Gender: Male  DOB: 07-30-1932  Age: 79 Years  PCP: Hannah Beat [facility])  Office Follow Up:  Does the office need to follow up with this patient?: Yes  Instructions For The Office: Refused ED; No appointments remain at office.  Please call back and reinforce need for ED now for severe vomiting with diarrhea.  RN Note:  Throat sore from frequent vomiting. Vomited at least 6 times and 4 watery stools. Last void at 1430. Notes weakness when up and shortness of breath.  Symptoms  Reason For Call & Symptoms: Severe vomiting with diarrhea  Reviewed Health History In EMR: Yes  Reviewed Medications In EMR: Yes  Reviewed Allergies In EMR: Yes  Reviewed Surgeries / Procedures: Yes  Date of Onset of Symptoms: 07/25/2012  Treatments Tried: sips of ginger ale  Treatments Tried Worked: No  Guideline(s) Used:  Vomiting  Disposition Per Guideline:   Go to ED Now  Reason For Disposition Reached:   Severe vomiting (e.g., 6 or more times/day)  Advice Given:  N/A  Patient Refused Recommendation:  Patient Refused Care Advice  Refused to go to ED even when advised of serious health risk from dehydration and loss of electrolytes.

## 2012-07-25 NOTE — Telephone Encounter (Signed)
This can probably wait until the morning to be checked. Can you help get him an appointment in the morning?  If he wants, we can send in some  Phenergan 25 mg, 1 po q 6 hours prn nausea, #30, 0 refills  Could crush and put in applesauce if needed

## 2012-07-25 NOTE — Telephone Encounter (Signed)
Wife advised.  Appt scheduled with Dr. Ermalene Searing on 07/26/12.  Patient's wife says they don't have the money to get the Phenergan so she declines the Rx.

## 2012-07-26 ENCOUNTER — Ambulatory Visit: Payer: Medicare Other | Admitting: Family Medicine

## 2012-08-04 ENCOUNTER — Encounter: Payer: Medicare Other | Admitting: Cardiothoracic Surgery

## 2012-08-04 ENCOUNTER — Other Ambulatory Visit: Payer: Medicare Other

## 2012-10-06 ENCOUNTER — Other Ambulatory Visit: Payer: Self-pay | Admitting: Family Medicine

## 2013-04-19 ENCOUNTER — Other Ambulatory Visit: Payer: Self-pay | Admitting: Family Medicine

## 2013-06-23 ENCOUNTER — Encounter: Payer: Self-pay | Admitting: *Deleted

## 2013-08-14 ENCOUNTER — Ambulatory Visit (INDEPENDENT_AMBULATORY_CARE_PROVIDER_SITE_OTHER): Payer: Medicare Other | Admitting: Family Medicine

## 2013-08-14 ENCOUNTER — Encounter: Payer: Self-pay | Admitting: Family Medicine

## 2013-08-14 VITALS — BP 140/82 | HR 88 | Temp 97.5°F | Ht 72.75 in | Wt 167.5 lb

## 2013-08-14 DIAGNOSIS — R911 Solitary pulmonary nodule: Secondary | ICD-10-CM

## 2013-08-14 DIAGNOSIS — M109 Gout, unspecified: Secondary | ICD-10-CM

## 2013-08-14 DIAGNOSIS — G990 Autonomic neuropathy in diseases classified elsewhere: Secondary | ICD-10-CM

## 2013-08-14 DIAGNOSIS — E785 Hyperlipidemia, unspecified: Secondary | ICD-10-CM

## 2013-08-14 DIAGNOSIS — I10 Essential (primary) hypertension: Secondary | ICD-10-CM

## 2013-08-14 DIAGNOSIS — E871 Hypo-osmolality and hyponatremia: Secondary | ICD-10-CM

## 2013-08-14 DIAGNOSIS — Z0000 Encounter for general adult medical examination without abnormal findings: Secondary | ICD-10-CM

## 2013-08-14 DIAGNOSIS — G609 Hereditary and idiopathic neuropathy, unspecified: Secondary | ICD-10-CM

## 2013-08-14 DIAGNOSIS — Z79899 Other long term (current) drug therapy: Secondary | ICD-10-CM

## 2013-08-14 DIAGNOSIS — R7401 Elevation of levels of liver transaminase levels: Secondary | ICD-10-CM

## 2013-08-14 DIAGNOSIS — Z9119 Patient's noncompliance with other medical treatment and regimen: Secondary | ICD-10-CM

## 2013-08-14 DIAGNOSIS — F1020 Alcohol dependence, uncomplicated: Secondary | ICD-10-CM

## 2013-08-14 DIAGNOSIS — K769 Liver disease, unspecified: Secondary | ICD-10-CM

## 2013-08-14 DIAGNOSIS — R740 Nonspecific elevation of levels of transaminase and lactic acid dehydrogenase [LDH]: Secondary | ICD-10-CM

## 2013-08-14 DIAGNOSIS — R7402 Elevation of levels of lactic acid dehydrogenase (LDH): Secondary | ICD-10-CM

## 2013-08-14 DIAGNOSIS — K7030 Alcoholic cirrhosis of liver without ascites: Secondary | ICD-10-CM

## 2013-08-14 DIAGNOSIS — G63 Polyneuropathy in diseases classified elsewhere: Secondary | ICD-10-CM

## 2013-08-14 DIAGNOSIS — E237 Disorder of pituitary gland, unspecified: Secondary | ICD-10-CM

## 2013-08-14 DIAGNOSIS — Z91199 Patient's noncompliance with other medical treatment and regimen due to unspecified reason: Secondary | ICD-10-CM

## 2013-08-14 DIAGNOSIS — E46 Unspecified protein-calorie malnutrition: Secondary | ICD-10-CM

## 2013-08-14 NOTE — Progress Notes (Signed)
Pre-visit discussion using our clinic review tool. No additional management support is needed unless otherwise documented below in the visit note.  

## 2013-08-14 NOTE — Progress Notes (Signed)
Date:  08/14/2013   Name:  Bob Wilson   DOB:  12-02-1932   MRN:  [id number] Gender: male Age: 80 y.o.  Primary Physician:  Owens Loffler, MD   Chief Complaint: Medicare Wellness   Subjective:   History of Present Illness:  Bob Wilson is a 80 y.o. pleasant patient who presents with the following:  Preventative Health Maintenance Visit:  Rarely gets out of the house Chronic alcoholic Here with his wife Stopped going to church  Health Maintenance Summary Reviewed and updated, unless pt declines services.  Tobacco History Reviewed. Alcohol: DRINKS > 1/5 LIQUOR A DAY, USUALLY ALL DAY Exercise Habits: NONE STD concerns: no risk or activity to increase risk Drug Use: None Encouraged self-testicular check  Health Maintenance  Topic Date Due  . Influenza Vaccine  08/15/2014  . Colonoscopy  08/15/2014  . Pneumococcal Polysaccharide Vaccine Age 6 And Over  08/15/2014  . Zostavax  08/15/2014  . Tetanus/tdap  08/15/2014  (all refused)  There is no immunization history for the selected administration types on file for this patient.  Patient Active Problem List   Diagnosis Date Noted  . Protein-calorie malnutrition 08/15/2013    Priority: High  . Cirrhosis, alcoholic 95/62/1308    Priority: High  . 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 Chronic alcoholism 06/25/2011    Priority: High  . Medically noncompliant 06/25/2011    Priority: High  . DEGENERATIVE JOINT DISEASE, KNEE 05/21/2008    Priority: Medium  . Empyema of pleura 01/05/2012  . Nodule of left lung 01/05/2012  . SOB (shortness of breath) on exertion 06/04/2011  . LEUKOCYTOSIS 08/07/2010  . TRANSAMINASES, SERUM, ELEVATED 08/07/2010  . PROSTATE SPECIFIC ANTIGEN, ELEVATED 08/07/2010  . HYPONATREMIA 07/15/2010  . INGUINAL HERNIA, RIGHT 07/11/2010  . GOUT 02/11/2009  . HYPERTENSION, ESSENTIAL NOS 02/11/2009  . HYPERLIPIDEMIA 05/21/2008  . ANEMIA-NOS 05/21/2008  . GERD  05/21/2008    Past Medical History  Diagnosis Date  . Anemia     NOS  . Arthritis   . Gout   . Hyperlipidemia   . Hypertension   . GERD (gastroesophageal reflux disease)   . Elevated PSA     multiple times, refused urology eval [facility] notes)  . ETOH abuse   . Medically noncompliant     noncompliant with follow ups, labs.  . Alcoholism 06/25/2011  . Cirrhosis, alcoholic 6/57/8469  . 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 Lipoma excision      History   Social History  . Marital Status: Married    Spouse Name: N/A    Number of Children: N/A  . Years of Education: N/A   Occupational History  . former Engineer, building services    Social History Main Topics  . Smoking status: Former Smoker    Types: Cigarettes    Quit date: 07/20/1974  . Smokeless tobacco: Never Used     Comment: Remote- quit 45 years ago.  . Alcohol Use: 0.0 oz/week     Comment: rarely  . Drug Use: No  . Sexual Activity: Not Currently   Other Topics Concern  . Not on file   Social History Narrative  . No narrative on file    No family history on file.  Allergies  Allergen Reactions  . Ace Inhibitors Other (See Comments)    REACTION: Angioedema  . Lisinopril Swelling and Other (See Comments)    REACTION:  Facial Swelling    Medication list has been reviewed and updated.  Review of Systems:  General: Denies fever, chills, sweats. No significant weight loss. Eyes: Denies blurring,significant itching ENT: Denies earache, sore throat, and hoarseness. Cardiovascular: Denies chest pains, palpitations, dyspnea on exertion Respiratory: Denies cough, dyspnea at rest,wheeezing Breast: no concerns about lumps GI: Denies nausea, vomiting, diarrhea, constipation, change in bowel habits, abdominal pain, melena, hematochezia GU: Denies penile discharge, ED, urinary flow / outflow problems. No STD concerns. Musculoskeletal: Denies back pain. Foot pain.  Knee pain. Derm: Denies rash, itching Neuro: 2 falls this year. NEUROPATHY Psych: Denies depression, anxiety Endocrine: Denies cold intolerance, heat intolerance, polydipsia Heme: Denies enlarged lymph nodes Allergy: No hayfever  Objective:   Physical Examination: BP 140/82  Pulse 88  Temp(Src) 97.5 F (36.4 C) (Oral)  Ht 6' 0.75" (1.848 m)  Wt 167 lb 8 oz (75.978 kg)  BMI 22.25 kg/m2  SpO2 98% Ideal Body Weight: Weight in (lb) to have BMI = 25: 187.8  GEN: well developed, well nourished, no acute distress Eyes: conjunctiva and lids normal, PERRLA, EOMI ENT: TM clear, nares clear, oral exam WNL Neck: supple, no lymphadenopathy, no thyromegaly, no JVD Pulm: clear to auscultation and percussion, respiratory effort normal CV: regular rate and rhythm, S1-S2, no murmur, rub or gallop, no bruits, peripheral pulses normal and symmetric, no cyanosis, clubbing, edema or varicosities GI: soft, non-tender; no hepatosplenomegaly, masses; active bowel sounds all quadrants GU: defer Lymph: no cervical, axillary or inguinal adenopathy MSK: gait normal, muscle tone and strength WNL, significant knee crepitus  SKIN: clear, good turgor, color WNL, no rashes, lesions, or ulcerations Neuro: normal mental status, normal strength, sensation, and motion Psych: alert; oriented to person, place and time, normally interactive and not anxious or depressed in appearance.   Assessment & Plan:   Health Maintenance Exam: The patient's preventative maintenance and recommended screening tests for an annual wellness exam were reviewed in full today. Brought up to date unless services declined.  Counselled on the importance of diet, exercise, and its role in overall health and mortality. The patient's FH and SH was reviewed, including their home life, tobacco status, and drug and alcohol status.  I have personally reviewed the Medicare Annual Wellness questionnaire and have noted 1. The patient's medical  and social history 2. Their use of alcohol, tobacco or illicit drugs 3. Their current medications and supplements 4. The patient's functional ability including ADL's, fall risks, home safety risks and hearing or visual             impairment. 5. Diet and physical activities 6. Evidence for depression or mood disorders  The patients weight, height, BMI and visual acuity have been recorded in the chart I have made referrals, counseling and provided education to the patient based review of the above and I have provided the pt with a written personalized care plan for preventive services.  I have provided the patient with a copy of your personalized plan for preventive services. Instructed to take the time to review along with their updated medication list.   The patient declines routine health maintenance services noted. We reviewed that could lead to missing significant problems that could affect their mortality. He essentially declined all of my recommendations except for getting routine bloodwork. He declines all vaccines, all preventative maintenance, and he declines follow-up recommended on his abnormal CT's of the chest. He also knows that his last PSA was 13. The patient and his wife indicated that they understood  this and was willing to accept those risks. I was very clear and made sure they understand that declining services may mean missing existing cancer and other serious life-threatening problems that can result in death.  Recommended stopping alcohol. Alcohol I suspect has led to his malnutrition, hyponatremia, anemia, and cirrhosis.

## 2013-08-15 ENCOUNTER — Encounter: Payer: Self-pay | Admitting: Family Medicine

## 2013-08-15 DIAGNOSIS — K7030 Alcoholic cirrhosis of liver without ascites: Secondary | ICD-10-CM | POA: Insufficient documentation

## 2013-08-15 DIAGNOSIS — G990 Autonomic neuropathy in diseases classified elsewhere: Secondary | ICD-10-CM

## 2013-08-15 DIAGNOSIS — E46 Unspecified protein-calorie malnutrition: Secondary | ICD-10-CM | POA: Insufficient documentation

## 2013-08-15 DIAGNOSIS — K769 Liver disease, unspecified: Secondary | ICD-10-CM

## 2013-08-15 HISTORY — DX: Autonomic neuropathy in diseases classified elsewhere: G99.0

## 2013-08-15 HISTORY — DX: Alcoholic cirrhosis of liver without ascites: K70.30

## 2013-08-15 LAB — CBC WITH DIFFERENTIAL/PLATELET
BASOS ABS: 0.1 10*3/uL (ref 0.0–0.1)
BASOS PCT: 0.9 % (ref 0.0–3.0)
Eosinophils Absolute: 0.1 10*3/uL (ref 0.0–0.7)
Eosinophils Relative: 0.6 % (ref 0.0–5.0)
HEMATOCRIT: 32.2 % — AB (ref 39.0–52.0)
HEMOGLOBIN: 11.1 g/dL — AB (ref 13.0–17.0)
LYMPHS ABS: 2.2 10*3/uL (ref 0.7–4.0)
Lymphocytes Relative: 18 % (ref 12.0–46.0)
MCHC: 34.5 g/dL (ref 30.0–36.0)
MCV: 109.9 fl — ABNORMAL HIGH (ref 78.0–100.0)
MONOS PCT: 8.5 % (ref 3.0–12.0)
Monocytes Absolute: 1 10*3/uL (ref 0.1–1.0)
NEUTROS ABS: 8.8 10*3/uL — AB (ref 1.4–7.7)
Neutrophils Relative %: 72 % (ref 43.0–77.0)
Platelets: 250 10*3/uL (ref 150.0–400.0)
RBC: 2.93 Mil/uL — AB (ref 4.22–5.81)
RDW: 15.1 % — AB (ref 11.5–14.6)
WBC: 12.2 10*3/uL — ABNORMAL HIGH (ref 4.5–10.5)

## 2013-08-15 LAB — LIPID PANEL
Cholesterol: 131 mg/dL (ref 0–200)
HDL: 55.4 mg/dL (ref >39.00)
LDL Cholesterol: 67 mg/dL (ref 0–99)
Total CHOL/HDL Ratio: 2
Triglycerides: 43 mg/dL (ref 0.0–149.0)
VLDL: 8.6 mg/dL (ref 0.0–40.0)

## 2013-08-15 LAB — BASIC METABOLIC PANEL
BUN: 8 mg/dL (ref 6–23)
CO2: 25 meq/L (ref 19–32)
Calcium: 8.7 mg/dL (ref 8.4–10.5)
Chloride: 91 mEq/L — ABNORMAL LOW (ref 96–112)
Creatinine, Ser: 0.9 mg/dL (ref 0.4–1.5)
GFR: 100.79 mL/min (ref >60.00)
GLUCOSE: 101 mg/dL — AB (ref 70–99)
POTASSIUM: 4.2 meq/L (ref 3.5–5.1)
SODIUM: 127 meq/L — AB (ref 135–145)

## 2013-08-15 LAB — HEPATIC FUNCTION PANEL
ALBUMIN: 3 g/dL — AB (ref 3.5–5.2)
ALT: 27 U/L (ref 0–53)
AST: 59 U/L — AB (ref 0–37)
Alkaline Phosphatase: 231 U/L — ABNORMAL HIGH (ref 39–117)
Bilirubin, Direct: 0.4 mg/dL — ABNORMAL HIGH (ref 0.0–0.3)
Total Bilirubin: 2.1 mg/dL — ABNORMAL HIGH (ref 0.3–1.2)
Total Protein: 7.1 g/dL (ref 6.0–8.3)

## 2013-08-15 LAB — VITAMIN B12: VITAMIN B 12: 816 pg/mL (ref 211–911)

## 2013-08-15 LAB — FERRITIN: FERRITIN: 576.1 ng/mL — AB (ref 22.0–322.0)

## 2013-08-15 LAB — TSH: TSH: 1.32 u[IU]/mL (ref 0.35–5.50)

## 2013-08-17 ENCOUNTER — Encounter: Payer: Self-pay | Admitting: *Deleted

## 2013-08-18 LAB — VITAMIN B6: Vitamin B6: <2 ng/mL — ABNORMAL LOW (ref 2.1–21.7)

## 2014-01-08 ENCOUNTER — Telehealth: Payer: Self-pay

## 2014-01-08 NOTE — Telephone Encounter (Signed)
Appointment scheduled for 01/11/2014 @ 10:30am.

## 2014-01-08 NOTE — Telephone Encounter (Signed)
Mrs Dortch left note; pt started having swelling in both feet again; Lasix had been stopped last year due to swelling of feet not bothering pt. Mrs Colclough wants to know if should restart Lasix. When pt has feet up over night has slight improvement in swelling but swelling never completely goes away. No SOB or CP. Pt last seen 08/14/13.Please advise.[address]Mrs [place] request cb/

## 2014-01-08 NOTE — Telephone Encounter (Signed)
He really should follow-up, 30 mins, on wed or thurs to recheck before changing medication up. He has enough medical problems, we should check and make sure kidneys and liver are doing ok.

## 2014-01-11 ENCOUNTER — Ambulatory Visit (INDEPENDENT_AMBULATORY_CARE_PROVIDER_SITE_OTHER): Payer: Medicare Other | Admitting: Family Medicine

## 2014-01-11 ENCOUNTER — Ambulatory Visit: Payer: Medicare Other | Admitting: Family Medicine

## 2014-01-11 ENCOUNTER — Encounter: Payer: Self-pay | Admitting: Family Medicine

## 2014-01-11 VITALS — BP 134/80 | HR 85 | Temp 97.9°F | Ht 72.75 in | Wt 190.0 lb

## 2014-01-11 DIAGNOSIS — D509 Iron deficiency anemia, unspecified: Secondary | ICD-10-CM

## 2014-01-11 DIAGNOSIS — R609 Edema, unspecified: Secondary | ICD-10-CM

## 2014-01-11 DIAGNOSIS — E46 Unspecified protein-calorie malnutrition: Secondary | ICD-10-CM

## 2014-01-11 DIAGNOSIS — F1020 Alcohol dependence, uncomplicated: Secondary | ICD-10-CM

## 2014-01-11 DIAGNOSIS — R0609 Other forms of dyspnea: Secondary | ICD-10-CM

## 2014-01-11 DIAGNOSIS — R0989 Other specified symptoms and signs involving the circulatory and respiratory systems: Secondary | ICD-10-CM

## 2014-01-11 DIAGNOSIS — R0600 Dyspnea, unspecified: Secondary | ICD-10-CM

## 2014-01-11 DIAGNOSIS — K7030 Alcoholic cirrhosis of liver without ascites: Secondary | ICD-10-CM

## 2014-01-11 DIAGNOSIS — Z79899 Other long term (current) drug therapy: Secondary | ICD-10-CM

## 2014-01-11 MED ORDER — FUROSEMIDE 20 MG PO TABS: 20.0000 mg | ORAL_TABLET | Freq: Every day | ORAL | Status: DC

## 2014-01-11 NOTE — Progress Notes (Signed)
Pre visit review using our clinic review tool, if applicable. No additional management support is needed unless otherwise documented below in the visit note. 

## 2014-01-11 NOTE — Progress Notes (Signed)
[place] [address] Phone: [phone] Fax: [phone]  Patient ID: Bob Wilson MRN: [id number], DOB: 06-Jul-1933, 80 y.o. Date of Encounter: 01/11/2014  Primary Physician:  Owens Loffler, MD   Chief Complaint: Foot Swelling  Subjective:   History of Present Illness:  Bob Wilson is a 80 y.o. pleasant patient who presents with the following:  The patient is a chronic alcoholic and one at 52 years, and he has cirrhosis at this point, alteration in his liver function, and he is also medically noncompliant with those recommendations.  He also has some significant protein calorie malnutrition, and this is been ongoing for years. He basically will not drink anything other than a liquid diet, and he does consume alcohol long.  Today he presents with some edema, which is not that new for the patient. Previously was on some diuretics and took some Lasix intermittently.  He is accompanied by his wife, also helps with that history.  Patient Active Problem List   Diagnosis Date Noted  . Protein-calorie malnutrition 08/15/2013    Priority: High  . Cirrhosis, alcoholic 50/38/8828    Priority: High  . 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 Continuous chronic alcoholism 06/25/2011    Priority: High  . Medically noncompliant 06/25/2011    Priority: High  . DEGENERATIVE JOINT DISEASE, KNEE 05/21/2008    Priority: Medium  . Empyema of pleura 01/05/2012  . Nodule of left lung 01/05/2012  . SOB (shortness of breath) on exertion 06/04/2011  . LEUKOCYTOSIS 08/07/2010  . TRANSAMINASES, SERUM, ELEVATED 08/07/2010  . PROSTATE SPECIFIC ANTIGEN, ELEVATED 08/07/2010  . HYPONATREMIA 07/15/2010  . INGUINAL HERNIA, RIGHT 07/11/2010  . GOUT 02/11/2009  . HYPERTENSION, ESSENTIAL NOS 02/11/2009  . HYPERLIPIDEMIA 05/21/2008  . ANEMIA-NOS 05/21/2008  . GERD 05/21/2008   Past Medical History  Diagnosis Date  . Anemia     NOS  . Arthritis   . Gout     . Hyperlipidemia   . Hypertension   . GERD (gastroesophageal reflux disease)   . Elevated PSA     multiple times, refused urology eval [facility] notes)  . ETOH abuse   . Medically noncompliant     noncompliant with follow ups, labs.  . Alcoholism 06/25/2011  . Cirrhosis, alcoholic 0/09/4915  . 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 Lipoma excision     History   Social History  . Marital Status: Married    Spouse Name: N/A    Number of Children: N/A  . Years of Education: N/A   Occupational History  . former Engineer, building services    Social History Main Topics  . Smoking status: Former Smoker    Types: Cigarettes    Quit date: 07/20/1974  . Smokeless tobacco: Never Used     Comment: Remote- quit 45 years ago.  . Alcohol Use: 0.0 oz/week     Comment: rarely  . Drug Use: No  . Sexual Activity: Not Currently   Other Topics Concern  . Not on file   Social History Narrative  . No narrative on file   No family history on file. Allergies  Allergen Reactions  . Ace Inhibitors Other (See Comments)    REACTION: Angioedema  . Lisinopril Swelling and Other (See Comments)    REACTION: Facial Swelling   Medication list has been reviewed and updated.  Review of Systems:  GEN: No acute illnesses, no  fevers, chills. GI: having trouble eating Pulm: No SOB Edema Interactive and getting along well at home.  Otherwise, ROS is as per the HPI.  Objective:   Physical Examination: BP 134/80  Pulse 85  Temp(Src) 97.9 F (36.6 C) (Oral)  Ht 6' 0.75" (1.848 m)  Wt 190 lb (86.183 kg)  BMI 25.24 kg/m2  SpO2 98%   GEN: WDWN, NAD, Non-toxic, A & O x 3 HEENT: Atraumatic, Normocephalic. Neck supple. No masses, No LAD. Ears and Nose: No external deformity. CV: RRR, No M/G/R. No JVD. No thrill. No extra heart sounds. PULM: CTA B, no wheezes, crackles, rhonchi. No retractions. No resp. distress. No accessory muscle use. EXTR: 1+  LE Edema NEURO wheelchair.  PSYCH: Normally interactive. Conversant. Not depressed or anxious appearing.  Calm demeanor.   Laboratory and Imaging Data: Results for orders placed in visit on 40/98/11  BASIC METABOLIC PANEL      Result Value Ref Range   Sodium 127 (*) 135 - 145 mEq/L   Potassium 4.2  3.5 - 5.1 mEq/L   Chloride 91 (*) 96 - 112 mEq/L   CO2 25  19 - 32 mEq/L   Glucose, Bld 101 (*) 70 - 99 mg/dL   BUN 8  6 - 23 mg/dL   Creatinine, Ser 0.9  0.4 - 1.5 mg/dL   Calcium 8.7  8.4 - 10.5 mg/dL   GFR 100.79  >60.00 mL/min  CBC WITH DIFFERENTIAL      Result Value Ref Range   WBC 12.2 (*) 4.5 - 10.5 K/uL   RBC 2.93 (*) 4.22 - 5.81 Mil/uL   Hemoglobin 11.1 (*) 13.0 - 17.0 g/dL   HCT 32.2 (*) 39.0 - 52.0 %   MCV 109.9 (*) 78.0 - 100.0 fl   MCHC 34.5  30.0 - 36.0 g/dL   RDW 15.1 (*) 11.5 - 14.6 %   Platelets 250.0  150.0 - 400.0 K/uL   Neutrophils Relative % 72.0  43.0 - 77.0 %   Lymphocytes Relative 18.0  12.0 - 46.0 %   Monocytes Relative 8.5  3.0 - 12.0 %   Eosinophils Relative 0.6  0.0 - 5.0 %   Basophils Relative 0.9  0.0 - 3.0 %   Neutro Abs 8.8 (*) 1.4 - 7.7 K/uL   Lymphs Abs 2.2  0.7 - 4.0 K/uL   Monocytes Absolute 1.0  0.1 - 1.0 K/uL   Eosinophils Absolute 0.1  0.0 - 0.7 K/uL   Basophils Absolute 0.1  0.0 - 0.1 K/uL  HEPATIC FUNCTION PANEL      Result Value Ref Range   Total Bilirubin 2.1 (*) 0.3 - 1.2 mg/dL   Bilirubin, Direct 0.4 (*) 0.0 - 0.3 mg/dL   Alkaline Phosphatase 231 (*) 39 - 117 U/L   AST 59 (*) 0 - 37 U/L   ALT 27  0 - 53 U/L   Total Protein 7.1  6.0 - 8.3 g/dL   Albumin 3.0 (*) 3.5 - 5.2 g/dL  LIPID PANEL      Result Value Ref Range   Cholesterol 131  0 - 200 mg/dL   Triglycerides 43.0  0.0 - 149.0 mg/dL   HDL 55.40  >39.00 mg/dL   VLDL 8.6  0.0 - 40.0 mg/dL   LDL Cholesterol 67  0 - 99 mg/dL   Total CHOL/HDL Ratio 2    TSH      Result Value Ref Range   TSH 1.32  0.35 - 5.50 uIU/mL  FERRITIN  Result Value Ref Range   Ferritin 576.1  (*) 22.0 - 322.0 ng/mL  VITAMIN B12      Result Value Ref Range   Vitamin B-12 816  211 - 911 pg/mL  VITAMIN B6      Result Value Ref Range   Vitamin B6 <2.0 (*) 2.1 - 21.7 ng/mL     Assessment & Plan:   Edema - Plan: Basic metabolic panel, Hepatic function panel  Dyspnea - Plan: CANCELED: Brain natriuretic peptide  Iron deficiency anemia - Plan: CBC with Differential  Encounter for long-term (current) use of other medications - Plan: Basic metabolic panel  Alcoholic cirrhosis of liver without ascites  Continuous chronic alcoholism  Protein-calorie malnutrition  Only to recheck his laboratories, when they called and told me that his edema has gotten much worse. Ensure that his renal function is adequate, and recheck his liver enzymes.  I told him he can eat anything he would like. Equate shakes TID.  New Prescriptions   FUROSEMIDE (LASIX) 20 MG TABLET    Take 1 tablet (20 mg total) by mouth daily. If needed for swelling   Modified Medications   No medications on file   Orders Placed This Encounter  Procedures  . Basic metabolic panel  . CBC with Differential  . Hepatic function panel   Follow-up: No Follow-up on file. Unless noted above, the patient is to follow-up if symptoms worsen. Red flags were reviewed with the patient.  Signed,  Maud Deed. Rease Swinson, MD, [facility]   Discontinued Medications   No medications on file   Current Medications at Discharge:   Medication List       This list is accurate as of: 01/11/14  6:02 PM.  Always use your most recent med list.               COLCRYS 0.6 MG tablet  Generic drug:  colchicine  take 1 tablet by mouth twice a day if needed     furosemide 20 MG tablet  Commonly known as:  LASIX  Take 1 tablet (20 mg total) by mouth daily. If needed for swelling     HYDROcodone-acetaminophen 5-500 MG per tablet  Commonly known as:  VICODIN  Take 1 tablet by mouth every 6 (six) hours as needed. For pain       ibuprofen 200 MG tablet  Commonly known as:  ADVIL,MOTRIN  Take 400-600 mg by mouth every 6 (six) hours as needed. For pain     indomethacin 25 MG capsule  Commonly known as:  INDOCIN  take 1 capsule by mouth twice a day with meals     iron polysaccharides 150 MG capsule  Commonly known as:  NIFEREX  Take 150 mg by mouth every other day.     multivitamin with minerals Tabs tablet  Take 1 tablet by mouth every other day.

## 2014-01-12 ENCOUNTER — Encounter: Payer: Self-pay | Admitting: *Deleted

## 2014-01-12 LAB — HEPATIC FUNCTION PANEL
ALBUMIN: 3.3 g/dL — AB (ref 3.5–5.2)
ALK PHOS: 162 U/L — AB (ref 39–117)
ALT: 34 U/L (ref 0–53)
AST: 77 U/L — AB (ref 0–37)
BILIRUBIN DIRECT: 0.6 mg/dL — AB (ref 0.0–0.3)
Total Bilirubin: 2.1 mg/dL — ABNORMAL HIGH (ref 0.2–1.2)
Total Protein: 7.2 g/dL (ref 6.0–8.3)

## 2014-01-12 LAB — CBC WITH DIFFERENTIAL/PLATELET
BASOS PCT: 0.4 % (ref 0.0–3.0)
Basophils Absolute: 0 10*3/uL (ref 0.0–0.1)
Eosinophils Absolute: 0.1 10*3/uL (ref 0.0–0.7)
Eosinophils Relative: 0.8 % (ref 0.0–5.0)
HCT: 33.5 % — ABNORMAL LOW (ref 39.0–52.0)
HEMOGLOBIN: 11.4 g/dL — AB (ref 13.0–17.0)
LYMPHS PCT: 18.4 % (ref 12.0–46.0)
Lymphs Abs: 2.1 10*3/uL (ref 0.7–4.0)
MCHC: 33.9 g/dL (ref 30.0–36.0)
MCV: 112.4 fl — ABNORMAL HIGH (ref 78.0–100.0)
MONOS PCT: 9.8 % (ref 3.0–12.0)
Monocytes Absolute: 1.1 10*3/uL — ABNORMAL HIGH (ref 0.1–1.0)
NEUTROS ABS: 8 10*3/uL — AB (ref 1.4–7.7)
NEUTROS PCT: 70.6 % (ref 43.0–77.0)
Platelets: 198 10*3/uL (ref 150.0–400.0)
RBC: 2.98 Mil/uL — AB (ref 4.22–5.81)
RDW: 17.3 % — ABNORMAL HIGH (ref 11.5–15.5)
WBC: 11.3 10*3/uL — ABNORMAL HIGH (ref 4.0–10.5)

## 2014-01-12 LAB — BASIC METABOLIC PANEL
BUN: 11 mg/dL (ref 6–23)
CALCIUM: 8.7 mg/dL (ref 8.4–10.5)
CO2: 31 mEq/L (ref 19–32)
Chloride: 89 mEq/L — ABNORMAL LOW (ref 96–112)
Creatinine, Ser: 1 mg/dL (ref 0.4–1.5)
GFR: 97.06 mL/min (ref >60.00)
Glucose, Bld: 97 mg/dL (ref 70–99)
Potassium: 3.5 mEq/L (ref 3.5–5.1)
SODIUM: 127 meq/L — AB (ref 135–145)

## 2014-01-17 ENCOUNTER — Emergency Department (HOSPITAL_COMMUNITY)
Admission: EM | Admit: 2014-01-17 | Discharge: 2014-01-18 | Disposition: A | Discharge status: Home / Self Care | Payer: Medicare Other | Attending: Emergency Medicine | Admitting: Emergency Medicine

## 2014-01-17 ENCOUNTER — Encounter (HOSPITAL_COMMUNITY): Payer: Self-pay | Admitting: Emergency Medicine

## 2014-01-17 ENCOUNTER — Emergency Department (HOSPITAL_COMMUNITY): Payer: Medicare Other

## 2014-01-17 DIAGNOSIS — Z791 Long term (current) use of non-steroidal anti-inflammatories (NSAID): Secondary | ICD-10-CM | POA: Insufficient documentation

## 2014-01-17 DIAGNOSIS — Y92009 Unspecified place in unspecified non-institutional (private) residence as the place of occurrence of the external cause: Secondary | ICD-10-CM | POA: Insufficient documentation

## 2014-01-17 DIAGNOSIS — F1021 Alcohol dependence, in remission: Secondary | ICD-10-CM | POA: Insufficient documentation

## 2014-01-17 DIAGNOSIS — I10 Essential (primary) hypertension: Secondary | ICD-10-CM | POA: Insufficient documentation

## 2014-01-17 DIAGNOSIS — S63599A Other specified sprain of unspecified wrist, initial encounter: Secondary | ICD-10-CM | POA: Insufficient documentation

## 2014-01-17 DIAGNOSIS — Z79899 Other long term (current) drug therapy: Secondary | ICD-10-CM | POA: Insufficient documentation

## 2014-01-17 DIAGNOSIS — S62609A Fracture of unspecified phalanx of unspecified finger, initial encounter for closed fracture: Secondary | ICD-10-CM

## 2014-01-17 DIAGNOSIS — W1809XA Striking against other object with subsequent fall, initial encounter: Secondary | ICD-10-CM | POA: Insufficient documentation

## 2014-01-17 DIAGNOSIS — Z87891 Personal history of nicotine dependence: Secondary | ICD-10-CM | POA: Insufficient documentation

## 2014-01-17 DIAGNOSIS — Z8719 Personal history of other diseases of the digestive system: Secondary | ICD-10-CM | POA: Insufficient documentation

## 2014-01-17 DIAGNOSIS — S0990XA Unspecified injury of head, initial encounter: Secondary | ICD-10-CM | POA: Insufficient documentation

## 2014-01-17 DIAGNOSIS — Y9389 Activity, other specified: Secondary | ICD-10-CM | POA: Insufficient documentation

## 2014-01-17 DIAGNOSIS — W19XXXA Unspecified fall, initial encounter: Secondary | ICD-10-CM

## 2014-01-17 DIAGNOSIS — S01511A Laceration without foreign body of lip, initial encounter: Secondary | ICD-10-CM

## 2014-01-17 DIAGNOSIS — M129 Arthropathy, unspecified: Secondary | ICD-10-CM | POA: Insufficient documentation

## 2014-01-17 DIAGNOSIS — S0081XA Abrasion of other part of head, initial encounter: Secondary | ICD-10-CM

## 2014-01-17 DIAGNOSIS — S01501A Unspecified open wound of lip, initial encounter: Secondary | ICD-10-CM | POA: Insufficient documentation

## 2014-01-17 DIAGNOSIS — M25332 Other instability, left wrist: Secondary | ICD-10-CM

## 2014-01-17 DIAGNOSIS — S66819A Strain of other specified muscles, fascia and tendons at wrist and hand level, unspecified hand, initial encounter: Secondary | ICD-10-CM

## 2014-01-17 DIAGNOSIS — Z8639 Personal history of other endocrine, nutritional and metabolic disease: Secondary | ICD-10-CM | POA: Insufficient documentation

## 2014-01-17 DIAGNOSIS — W108XXA Fall (on) (from) other stairs and steps, initial encounter: Secondary | ICD-10-CM | POA: Insufficient documentation

## 2014-01-17 DIAGNOSIS — Z862 Personal history of diseases of the blood and blood-forming organs and certain disorders involving the immune mechanism: Secondary | ICD-10-CM | POA: Insufficient documentation

## 2014-01-17 DIAGNOSIS — IMO0002 Reserved for concepts with insufficient information to code with codable children: Secondary | ICD-10-CM | POA: Insufficient documentation

## 2014-01-17 NOTE — ED Notes (Signed)
Pt has hematoma and abrasion over L eye. Abrasions on L side of face. Large lac to L upper lip. Bleeding controlled with pressure. Pt alert, no acute distress.

## 2014-01-17 NOTE — ED Notes (Signed)
Bed: WA01 Expected date:  Expected time:  Means of arrival:  Comments: 72yoM/fall

## 2014-01-17 NOTE — ED Notes (Signed)
Pt from home via [facility] c/o fall. He was walking down steps on his porch while trying to collect rain water. Pt slipped and fell hitting face on pavement. Pt was not able to get up after fall and was outside until his wife got home (about 1 1/2 hr) Pt has a laceration right above lip extending down to lip and large hematoma above left eye. Pt alert and oriented. No LOC and no blood thinners, bleeding controlled.

## 2014-01-17 NOTE — ED Notes (Signed)
Pt also c/o L wrist pain. Pt has some swelling to L wrist.

## 2014-01-18 ENCOUNTER — Emergency Department (HOSPITAL_COMMUNITY): Payer: Medicare Other

## 2014-01-18 MED ORDER — HYDROCODONE-ACETAMINOPHEN 5-325 MG PO TABS: 1.0000 | ORAL_TABLET | Freq: Four times a day (QID) | ORAL | Status: DC | PRN

## 2014-01-18 MED ORDER — IBUPROFEN 400 MG PO TABS: 400.0000 mg | ORAL_TABLET | Freq: Four times a day (QID) | ORAL | Status: DC | PRN

## 2014-01-18 MED ORDER — LIDOCAINE HCL 1 % IJ SOLN
5.0000 mL | Freq: Once | INTRAMUSCULAR | Status: AC
  Administered 2014-01-18: 5 mL
  Filled 2014-01-18: qty 20

## 2014-01-18 MED ORDER — CEPHALEXIN 500 MG PO CAPS: 500.0000 mg | ORAL_CAPSULE | Freq: Two times a day (BID) | ORAL | Status: DC

## 2014-01-18 NOTE — ED Provider Notes (Addendum)
CSN: [id number]     Arrival date & time 01/17/14  2246 History   First MD Initiated Contact with Patient 01/17/14 2307     Chief Complaint  Patient presents with  . Fall  . Lip Laceration     (Consider location/radiation/quality/duration/timing/severity/associated sxs/prior Treatment) HPI Comments: Pt comes in with cc of fall. Pt was walk down steps and slipped - in the process fell face first into bricks. Pt complains of lip pain, and left forehead pain. He is not on any blood thinners. Pt denies any chest pain, abd pain, hip pain.  Patient is a 80 y.o. male presenting with fall. The history is provided by the patient.  Fall Associated symptoms include headaches. Pertinent negatives include no chest pain, no abdominal pain and no shortness of breath.    Past Medical History  Diagnosis Date  . Anemia     NOS  . Arthritis   . Gout   . Hyperlipidemia   . Hypertension   . GERD (gastroesophageal reflux disease)   . Elevated PSA     multiple times, refused urology eval [facility] notes)  . ETOH abuse   . Medically noncompliant     noncompliant with follow ups, labs.  . Alcoholism 06/25/2011  . Cirrhosis, alcoholic 11/02/6061  . 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 Lipoma excision     No family history on file. History  Substance Use Topics  . Smoking status: Former Smoker    Types: Cigarettes    Quit date: 07/20/1974  . Smokeless tobacco: Never Used     Comment: Remote- quit 45 years ago.  . Alcohol Use: 0.0 oz/week     Comment: rarely    Review of Systems  Constitutional: Negative for activity change and appetite change.  HENT: Positive for facial swelling.   Respiratory: Negative for cough and shortness of breath.   Cardiovascular: Negative for chest pain.  Gastrointestinal: Negative for abdominal pain.  Genitourinary: Negative for dysuria.  Musculoskeletal: Negative for arthralgias, back pain, joint swelling and  neck pain.  Skin: Positive for wound.  Neurological: Positive for headaches. Negative for light-headedness.  Hematological: Does not bruise/bleed easily.      Allergies  Ace inhibitors and Lisinopril  Home Medications   Prior to Admission medications   Medication Sig Start Date End Date Taking? Authorizing Provider  colchicine 0.6 MG tablet Take 0.6 mg by mouth 2 (two) times daily as needed (gout).   Yes Historical Provider, MD  ENSURE (ENSURE) Take 237 mLs by mouth 2 (two) times daily.   Yes Historical Provider, MD  furosemide (LASIX) 20 MG tablet Take 1 tablet (20 mg total) by mouth daily. If needed for swelling 01/11/14  Yes Owens Loffler, MD  indomethacin (INDOCIN) 25 MG capsule take 1 capsule by mouth twice a day with meals 04/19/13  Yes Spencer Copland, MD  naproxen sodium (ANAPROX) 220 MG tablet Take 440 mg by mouth 2 (two) times daily as needed (pain).   Yes Historical Provider, MD   BP 177/112  Pulse 91  Temp(Src) 98.3 F (36.8 C) (Oral)  Resp 18  SpO2 96% Physical Exam  Nursing note and vitals reviewed. Constitutional: He is oriented to person, place, and time. He appears well-developed.  HENT:  Head: Normocephalic and atraumatic.  Complex superior lip laceration crossing the vermilion border which is deep. Teeth are intact  Eyes: Conjunctivae and EOM are normal. Pupils are equal, round, and reactive to light.  Neck: Normal range of motion. Neck supple.  Cardiovascular: Normal rate and regular rhythm.   Pulmonary/Chest: Effort normal and breath sounds normal.  Abdominal: Soft. Bowel sounds are normal. He exhibits no distension. There is no tenderness. There is no rebound and no guarding.  Musculoskeletal:  Patient has a complex laceration to the lip, and several facial abrasions and frontal hematoma. Also has some tenderness over the ulnar aspect of the hand with some swelling. No scaphoid tenderness. OTHERWISE: Head to toe evaluation shows no hematoma, bleeding of  the scalp, no facial abrasions, step offs, crepitus, no tenderness to palpation of the bilateral upper and lower extremities, no gross deformities, no chest tenderness, no pelvic pain.   Neurological: He is alert and oriented to person, place, and time.  Skin: Skin is warm.    ED Course  Procedures (including critical care time) Labs Review Labs Reviewed - No data to display  Imaging Review Dg Wrist Complete Left  01/17/2014   CLINICAL DATA:  Fall with lip laceration. Ulnar sided wrist swelling.  EXAM: LEFT WRIST - COMPLETE 3+ VIEW  COMPARISON:  None.  FINDINGS: Medial and lateral cortical disruption involving the proximal aspect of the fifth proximal phalanx. Widening of the scapholunate interval to 5 mm. There is mild settling of the capitate, suggesting chronic dissociation. 2 mm foreign body in the soft tissues superficial to the thumb base. There is associated soft tissue swelling such that this could be an acute foreign body. Diffuse osteopenia. Osteoarthritis, with advanced narrowing at the first [facility] and third MCP joints.  IMPRESSION: 1. Possible fifth proximal phalanx fracture, although only seen in 1 projection. If focal tenderness, dedicated hand or digit imaging suggested. 2. Scapholunate dissociation which is likely degenerative. 3. Age indeterminate 2 mm foreign body superficial to the base of thumb.   Electronically Signed   By: Jorje Guild M.D.   On: 01/17/2014 23:59   Ct Head Wo Contrast  01/18/2014   CLINICAL DATA:  Fall with lip laceration  EXAM: CT HEAD WITHOUT CONTRAST  CT MAXILLOFACIAL WITHOUT CONTRAST  TECHNIQUE: Multidetector CT imaging of the head and maxillofacial structures were performed using the standard protocol without intravenous contrast. Multiplanar CT image reconstructions of the maxillofacial structures were also generated.  COMPARISON:  01/07/2012 head CT  FINDINGS: CT HEAD FINDINGS  Skull and Sinuses:There is a large left frontal scalp contusion/ hematoma. No  foreign body or calvarial fracture. There is partial opacification of air cells in the left mastoid tip, with sclerosis, consistent with chronic mastoiditis. Sinus findings described on dedicated face imaging.  Orbits: See below.  Brain: No evidence of acute abnormality, such as acute infarction, hemorrhage, hydrocephalus, or mass lesion/mass effect. Enlargement of the extra-axial spaces which is likely from atrophy rather than small hygromas as there is no mass effect on the brain and there appears to be traversing vessels. There is brain atrophy which has a mild frontotemporal and cerebellar predilection. The atrophy has progressed from 2013, the pattern is stable from that time. There is moderate white matter disease with ischemic gliosis confluent around the lateral ventricles.  CT MAXILLOFACIAL FINDINGS  There is a large left frontal scalp hematoma. No associated foreign body. There is a large lower left lip laceration with laceration widening and subcutaneous gas. There is a metallic foreign body over the lower left face which is chronic based on neck CT 12/02/2006.  No acute fracture. No evidence of globe injury or postseptal hematoma.  Chronic sinusitis with diffuse mucosal thickening. There  have been medial maxillary antrostomies and partial bilateral ethmoidectomies. Lobulated, partly mineralized tissue in the inferior bilateral maxillary sinuses could represent polyps or mucous retention cysts.  IMPRESSION: 1. No acute intracranial hemorrhage. 2. Multi focal facial contusion and laceration; no acute fracture. 3. Frontal predominant brain atrophy which has progressed from 2013. 4. Chronic sinusitis and left mastoiditis.   Electronically Signed   By: Jorje Guild M.D.   On: 01/18/2014 01:05   Ct Maxillofacial Wo Cm  01/18/2014   CLINICAL DATA:  Fall with lip laceration  EXAM: CT HEAD WITHOUT CONTRAST  CT MAXILLOFACIAL WITHOUT CONTRAST  TECHNIQUE: Multidetector CT imaging of the head and maxillofacial  structures were performed using the standard protocol without intravenous contrast. Multiplanar CT image reconstructions of the maxillofacial structures were also generated.  COMPARISON:  01/07/2012 head CT  FINDINGS: CT HEAD FINDINGS  Skull and Sinuses:There is a large left frontal scalp contusion/ hematoma. No foreign body or calvarial fracture. There is partial opacification of air cells in the left mastoid tip, with sclerosis, consistent with chronic mastoiditis. Sinus findings described on dedicated face imaging.  Orbits: See below.  Brain: No evidence of acute abnormality, such as acute infarction, hemorrhage, hydrocephalus, or mass lesion/mass effect. Enlargement of the extra-axial spaces which is likely from atrophy rather than small hygromas as there is no mass effect on the brain and there appears to be traversing vessels. There is brain atrophy which has a mild frontotemporal and cerebellar predilection. The atrophy has progressed from 2013, the pattern is stable from that time. There is moderate white matter disease with ischemic gliosis confluent around the lateral ventricles.  CT MAXILLOFACIAL FINDINGS  There is a large left frontal scalp hematoma. No associated foreign body. There is a large lower left lip laceration with laceration widening and subcutaneous gas. There is a metallic foreign body over the lower left face which is chronic based on neck CT 12/02/2006.  No acute fracture. No evidence of globe injury or postseptal hematoma.  Chronic sinusitis with diffuse mucosal thickening. There have been medial maxillary antrostomies and partial bilateral ethmoidectomies. Lobulated, partly mineralized tissue in the inferior bilateral maxillary sinuses could represent polyps or mucous retention cysts.  IMPRESSION: 1. No acute intracranial hemorrhage. 2. Multi focal facial contusion and laceration; no acute fracture. 3. Frontal predominant brain atrophy which has progressed from 2013. 4. Chronic sinusitis  and left mastoiditis.   Electronically Signed   By: Jorje Guild M.D.   On: 01/18/2014 01:05     EKG Interpretation None      MDM   Final diagnoses:  Finger fracture, left, closed, initial encounter  Scapholunate dissociation of left wrist  Fall, initial encounter  Facial abrasion, initial encounter  Lip laceration, initial encounter    DDx includes: - Mechanical falls - ICH - Fractures - Contusions - Soft tissue injury  Pt is UTD with tetanus and healthy gentleman comes in after a fall.  Complex liver laceration - ENT consulted as the laceration violates the vermilion border. Imaging is negative, teeth are intact.  Pt also has a 5th digit proximal phalange fx and likely old scapholunate dissociation. Will splint and give hand f.u.  OFF NOTE - patient has RA, and the ring finger in trapped. However, there at the level of the phalange where the ring sits, there is room - at the joint is where it cannot be freed. Pt is electing for ring removal.  SPLINT APPLICATION Date/Time: 2:68 AM Authorized by: Varney Biles Consent: Verbal consent obtained.  Risks and benefits: risks, benefits and alternatives were discussed Consent given by: patient Splint applied by: orthopedic technician Location details: left hand Splint type: Volar Supplies used: gauze, ace wrap, plaster Post-procedure: The splinted body part was neurovascularly unchanged following the procedure. Patient tolerance: Patient tolerated the procedure well with no immediate complications.      Varney Biles, MD 01/18/14 4784  Varney Biles, MD 01/18/14 0124  LATE ENTRY: 4:30 PM Pt had his laceration repaired by ENT.  Varney Biles, MD 02/06/14 [phone]

## 2014-01-18 NOTE — ED Notes (Signed)
Pt at CT

## 2014-01-18 NOTE — Discharge Instructions (Signed)
Facial Laceration A facial laceration is a cut on the face. These injuries can be painful and cause bleeding. Some cuts may need to be closed with stitches (sutures), skin adhesive strips, or wound glue. Cuts usually heal quickly but can leave a scar. It can take 1-2 years for the scar to go away completely. HOME CARE   Only take medicines as told by your doctor.  Follow your doctor's instructions for wound care. For Stitches:  Keep the cut clean and dry.  If you have a bandage (dressing), change it at least once a day. Change the bandage if it gets wet or dirty, or as told by your doctor.  Wash the cut with soap and water 2 times a day. Rinse the cut with water. Pat it dry with a clean towel.  Put a thin layer of medicated cream on the cut as told by your doctor.  You may shower after the first 24 hours. Do not soak the cut in water until the stitches are removed.  Have your stitches removed as told by your doctor.  Do not wear any makeup until a few days after your stitches are removed. For Skin Adhesive Strips:  Keep the cut clean and dry.  Do not get the strips wet. You may take a bath, but be careful to keep the cut dry.  If the cut gets wet, pat it dry with a clean towel.  The strips will fall off on their own. Do not remove the strips that are still stuck to the cut. For Wound Glue:  You may shower or take baths. Do not soak or scrub the cut. Do not swim. Avoid heavy sweating until the glue falls off on its own. After a shower or bath, pat the cut dry with a clean towel.  Do not put medicine or makeup on your cut until the glue falls off.  If you have a bandage, do not put tape over the glue.  Avoid lots of sunlight or tanning lamps until the glue falls off.  The glue will fall off on its own in 5-10 days. Do not pick at the glue. After Healing: Put sunscreen on the cut for the first year to reduce your scar. GET HELP RIGHT AWAY IF:   Your cut area gets red,  painful, or puffy (swollen).  You see a yellowish-white fluid (pus) coming from the cut.  You have chills or a fever. MAKE SURE YOU:   Understand these instructions.  Will watch your condition.  Will get help right away if you are not doing well or get worse. Document Released: 12/23/2007 Document Revised: 04/26/2013 Document Reviewed: 02/16/2013 [facility] Patient Information 2015 [place], [place]. This information is not intended to replace advice given to you by your health care provider. Make sure you discuss any questions you have with your health care provider.  Fall Prevention and Home Safety Falls cause injuries and can affect all age groups. It is possible to use preventive measures to significantly decrease the likelihood of falls. There are many simple measures which can make your home safer and prevent falls. OUTDOORS  Repair cracks and edges of walkways and driveways.  Remove high doorway thresholds.  Trim shrubbery on the main path into your home.  Have good outside lighting.  Clear walkways of tools, rocks, debris, and clutter.  Check that handrails are not broken and are securely fastened. Both sides of steps should have handrails.  Have leaves, snow, and ice cleared regularly.  Use  sand or salt on walkways during winter months.  In the garage, clean up grease or oil spills. BATHROOM  Install night lights.  Install grab bars by the toilet and in the tub and shower.  Use non-skid mats or decals in the tub or shower.  Place a plastic non-slip stool in the shower to sit on, if needed.  Keep floors dry and clean up all water on the floor immediately.  Remove soap buildup in the tub or shower on a regular basis.  Secure bath mats with non-slip, double-sided rug tape.  Remove throw rugs and tripping hazards from the floors. BEDROOMS  Install night lights.  Make sure a bedside light is easy to reach.  Do not use oversized bedding.  Keep a telephone  by your bedside.  Have a firm chair with side arms to use for getting dressed.  Remove throw rugs and tripping hazards from the floor. KITCHEN  Keep handles on pots and pans turned toward the center of the stove. Use back burners when possible.  Clean up spills quickly and allow time for drying.  Avoid walking on wet floors.  Avoid hot utensils and knives.  Position shelves so they are not too high or low.  Place commonly used objects within easy reach.  If necessary, use a sturdy step stool with a grab bar when reaching.  Keep electrical cables out of the way.  Do not use floor polish or wax that makes floors slippery. If you must use wax, use non-skid floor wax.  Remove throw rugs and tripping hazards from the floor. STAIRWAYS  Never leave objects on stairs.  Place handrails on both sides of stairways and use them. Fix any loose handrails. Make sure handrails on both sides of the stairways are as long as the stairs.  Check carpeting to make sure it is firmly attached along stairs. Make repairs to worn or loose carpet promptly.  Avoid placing throw rugs at the top or bottom of stairways, or properly secure the rug with carpet tape to prevent slippage. Get rid of throw rugs, if possible.  Have an electrician put in a light switch at the top and bottom of the stairs. OTHER FALL PREVENTION TIPS  Wear low-heel or rubber-soled shoes that are supportive and fit well. Wear closed toe shoes.  When using a stepladder, make sure it is fully opened and both spreaders are firmly locked. Do not climb a closed stepladder.  Add color or contrast paint or tape to grab bars and handrails in your home. Place contrasting color strips on first and last steps.  Learn and use mobility aids as needed. Install an electrical emergency response system.  Turn on lights to avoid dark areas. Replace light bulbs that burn out immediately. Get light switches that glow.  Arrange furniture to create  clear pathways. Keep furniture in the same place.  Firmly attach carpet with non-skid or double-sided tape.  Eliminate uneven floor surfaces.  Select a carpet pattern that does not visually hide the edge of steps.  Be aware of all pets. OTHER HOME SAFETY TIPS  Set the water temperature for 120 F (48.8 C).  Keep emergency numbers on or near the telephone.  Keep smoke detectors on every level of the home and near sleeping areas. Document Released: 06/26/2002 Document Revised: 01/05/2012 Document Reviewed: 09/25/2011 [facility] Patient Information 2015 [place], [place]. This information is not intended to replace advice given to you by your health care provider. Make sure you discuss any questions  you have with your health care provider.  Finger Fracture Fractures of fingers are breaks in the bones of the fingers. There are many types of fractures. There are different ways of treating these fractures. Your health care provider will discuss the best way to treat your fracture. CAUSES Traumatic injury is the main cause of broken fingers. These include:  Injuries while playing sports.  Workplace injuries.  Falls. RISK FACTORS Activities that can increase your risk of finger fractures include:  Sports.  Workplace activities that involve machinery.  A condition called osteoporosis, which can make your bones less dense and cause them to fracture more easily. SIGNS AND SYMPTOMS The main symptoms of a broken finger are pain and swelling within 15 minutes after the injury. Other symptoms include:  Bruising of your finger.  Stiffness of your finger.  Numbness of your finger.  Exposed bones (compound fracture) if the fracture is severe. DIAGNOSIS  The best way to diagnose a broken bone is with X-ray imaging. Additionally, your health care provider will use this X-ray image to evaluate the position of the broken finger bones.  TREATMENT  Finger fractures can be treated with:    Nonreduction--This means the bones are in place. The finger is splinted without changing the positions of the bone pieces. The splint is usually left on for about a week to 10 days. This will depend on your fracture and what your health care provider thinks.  Closed reduction--The bones are put back into position without using surgery. The finger is then splinted.  Open reduction and internal fixation--The fracture site is opened. Then the bone pieces are fixed into place with pins or some type of hardware. This is seldom required. It depends on the severity of the fracture. HOME CARE INSTRUCTIONS   Follow your health care provider's instructions regarding activities, exercises, and physical therapy.  Only take over-the-counter or prescription medicines for pain, discomfort, or fever as directed by your health care provider. SEEK MEDICAL CARE IF: You have pain or swelling that limits the motion or use of your fingers. SEEK IMMEDIATE MEDICAL CARE IF:  Your finger becomes numb. MAKE SURE YOU:   Understand these instructions.  Will watch your condition.  Will get help right away if you are not doing well or get worse. Document Released: 10/18/2000 Document Revised: 04/26/2013 Document Reviewed: 02/15/2013 [facility] Patient Information 2015 [place], [place]. This information is not intended to replace advice given to you by your health care provider. Make sure you discuss any questions you have with your health care provider.

## 2014-01-18 NOTE — ED Notes (Signed)
ENT at bedside

## 2014-01-18 NOTE — Consult Note (Signed)
Reason for Consult: Complex lip laceration Referring Physician: Varney Biles, MD  HPI:  Bob Wilson is an 80 y.o. male who presents to [facility] ER c/o facial trauma s/p fall. Pt was walking down steps and slipped - in the process fell face first onto bricks. Pt complains of lip pain, and left forehead pain. He is not on any blood thinners. Pt denies any chest pain, abd pain, hip pain. Associated symptoms include headaches. Pertinent negatives include no chest pain, no abdominal pain and no shortness of breath. ENT consulted to evaluate the large and complex left upper lip laceration.   Past Medical History  Diagnosis Date  . Anemia     NOS  . Arthritis   . Gout   . Hyperlipidemia   . Hypertension   . GERD (gastroesophageal reflux disease)   . Elevated PSA     multiple times, refused urology eval [facility] notes)  . ETOH abuse   . Medically noncompliant     noncompliant with follow ups, labs.  . Alcoholism 06/25/2011  . Cirrhosis, alcoholic 6/76/1950  . 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 Lipoma excision      No family history on file.  Social History:  reports that he quit smoking about 39 years ago. His smoking use included Cigarettes. He smoked 0.00 packs per day. He has never used smokeless tobacco. He reports that he drinks alcohol. He reports that he does not use illicit drugs.  Allergies:  Allergies  Allergen Reactions  . Ace Inhibitors Other (See Comments)    REACTION: Angioedema  . Lisinopril Swelling and Other (See Comments)    REACTION: Facial Swelling    Home Medications    Prior to Admission medications   Medication  Sig  Start Date  End Date  Taking?  Authorizing Provider   colchicine 0.6 MG tablet  Take 0.6 mg by mouth 2 (two) times daily as needed (gout).    Yes  Historical Provider, MD   ENSURE (ENSURE)  Take 237 mLs by mouth 2 (two) times daily.    Yes  Historical Provider, MD   furosemide  (LASIX) 20 MG tablet  Take 1 tablet (20 mg total) by mouth daily. If needed for swelling  01/11/14   Yes  Owens Loffler, MD   indomethacin (INDOCIN) 25 MG capsule  take 1 capsule by mouth twice a day with meals  04/19/13   Yes  Spencer Copland, MD   naproxen sodium (ANAPROX) 220 MG tablet  Take 440 mg by mouth 2 (two) times daily as needed (pain).    Yes  Historical     Ct Head Wo Contrast  01/18/2014   CLINICAL DATA:  Fall with lip laceration  EXAM: CT HEAD WITHOUT CONTRAST  CT MAXILLOFACIAL WITHOUT CONTRAST  TECHNIQUE: Multidetector CT imaging of the head and maxillofacial structures were performed using the standard protocol without intravenous contrast. Multiplanar CT image reconstructions of the maxillofacial structures were also generated.  COMPARISON:  01/07/2012 head CT  FINDINGS: CT HEAD FINDINGS  Skull and Sinuses:There is a large left frontal scalp contusion/ hematoma. No foreign body or calvarial fracture. There is partial opacification of air cells in the left mastoid tip, with sclerosis, consistent with chronic mastoiditis. Sinus findings described on dedicated face imaging.  Orbits: See below.  Brain: No evidence of acute abnormality, such as acute infarction, hemorrhage, hydrocephalus, or mass lesion/mass effect. Enlargement of the extra-axial spaces which is  likely from atrophy rather than small hygromas as there is no mass effect on the brain and there appears to be traversing vessels. There is brain atrophy which has a mild frontotemporal and cerebellar predilection. The atrophy has progressed from 2013, the pattern is stable from that time. There is moderate white matter disease with ischemic gliosis confluent around the lateral ventricles.  CT MAXILLOFACIAL FINDINGS  There is a large left frontal scalp hematoma. No associated foreign body. There is a large lower left lip laceration with laceration widening and subcutaneous gas. There is a metallic foreign body over the lower left face which  is chronic based on neck CT 12/02/2006.  No acute fracture. No evidence of globe injury or postseptal hematoma.  Chronic sinusitis with diffuse mucosal thickening. There have been medial maxillary antrostomies and partial bilateral ethmoidectomies. Lobulated, partly mineralized tissue in the inferior bilateral maxillary sinuses could represent polyps or mucous retention cysts.  IMPRESSION: 1. No acute intracranial hemorrhage. 2. Multi focal facial contusion and laceration; no acute fracture. 3. Frontal predominant brain atrophy which has progressed from 2013. 4. Chronic sinusitis and left mastoiditis.   Electronically Signed   By: Jorje Guild M.D.   On: 01/18/2014 01:05   Ct Maxillofacial Wo Cm  01/18/2014   CLINICAL DATA:  Fall with lip laceration  EXAM: CT HEAD WITHOUT CONTRAST  CT MAXILLOFACIAL WITHOUT CONTRAST  TECHNIQUE: Multidetector CT imaging of the head and maxillofacial structures were performed using the standard protocol without intravenous contrast. Multiplanar CT image reconstructions of the maxillofacial structures were also generated.  COMPARISON:  01/07/2012 head CT  FINDINGS: CT HEAD FINDINGS  Skull and Sinuses:There is a large left frontal scalp contusion/ hematoma. No foreign body or calvarial fracture. There is partial opacification of air cells in the left mastoid tip, with sclerosis, consistent with chronic mastoiditis. Sinus findings described on dedicated face imaging.  Orbits: See below.  Brain: No evidence of acute abnormality, such as acute infarction, hemorrhage, hydrocephalus, or mass lesion/mass effect. Enlargement of the extra-axial spaces which is likely from atrophy rather than small hygromas as there is no mass effect on the brain and there appears to be traversing vessels. There is brain atrophy which has a mild frontotemporal and cerebellar predilection. The atrophy has progressed from 2013, the pattern is stable from that time. There is moderate white matter disease with  ischemic gliosis confluent around the lateral ventricles.  CT MAXILLOFACIAL FINDINGS  There is a large left frontal scalp hematoma. No associated foreign body. There is a large lower left lip laceration with laceration widening and subcutaneous gas. There is a metallic foreign body over the lower left face which is chronic based on neck CT 12/02/2006.  No acute fracture. No evidence of globe injury or postseptal hematoma.  Chronic sinusitis with diffuse mucosal thickening. There have been medial maxillary antrostomies and partial bilateral ethmoidectomies. Lobulated, partly mineralized tissue in the inferior bilateral maxillary sinuses could represent polyps or mucous retention cysts.  IMPRESSION: 1. No acute intracranial hemorrhage. 2. Multi focal facial contusion and laceration; no acute fracture. 3. Frontal predominant brain atrophy which has progressed from 2013. 4. Chronic sinusitis and left mastoiditis.   Electronically Signed   By: Jorje Guild M.D.   On: 01/18/2014 01:05   Review of Systems  Constitutional: Negative for activity change and appetite change.  HENT: Positive for facial swelling.  Respiratory: Negative for cough and shortness of breath.  Cardiovascular: Negative for chest pain.  Gastrointestinal: Negative for abdominal pain.  Genitourinary:  Negative for dysuria.  Musculoskeletal: Negative for arthralgias, back pain, joint swelling and neck pain.  Skin: Positive for wound.  Neurological: Positive for headaches. Negative for light-headedness.  Hematological: Does not bruise/bleed easily.   Blood pressure 177/112, pulse 91, temperature 98.3 F (36.8 C), temperature source Oral, resp. rate 18, SpO2 96.00%. Physical Exam  Nursing note and vitals reviewed.  Constitutional: He is oriented to person, place, and time. He appears well-developed.  HENT:  Head: Normocephalic and atraumatic.  Ears: Normal EACs and auricles. Nose: Normal mucosa, septum. Complex thru and thru 5cm  superior lip laceration crossing the vermilion border. Teeth are intact  Eyes: Conjunctivae and EOM are normal. Pupils are equal, round, and reactive to light.  Neck: Normal range of motion. Neck supple. Trachea midline. Cardiovascular: Normal rate and regular rhythm.  Pulmonary/Chest: Effort normal and breath sounds normal.  Neurological: He is alert and oriented to person, place, and time.  Skin: Skin is warm.   Procedure: Complex repair of left upper lip laceration (5cm) Anesthesia: Local anesthesia with 1% lidocaine with 1:100,000 epinephrine Description: The patient is placed supine on the exam table. The upper lip is prepped and draped in a sterile fashion.  After adequate local anesthesia is achieved, the laceration site is carefully debrided.  Due to the asymmetry of the laceration, an advancement flap is fashioned from the soft tissue adjacent to the laceration site. Extensive soft tissue undermining is performed to release the skin tension. The laceration is closed in layers with interrupted sutures.  The patient tolerated the procedure well.    Assessment/Plan: Complex and large left upper lip laceration.  Repaired in the ER under local anesthesia. Pt will f/u in my office in 1 week for suture removal.  Eustacia Urbanek,SUI W 01/18/2014, 2:48 AM

## 2014-01-18 NOTE — ED Notes (Signed)
Ortho Tech at bedside.  

## 2014-05-05 ENCOUNTER — Other Ambulatory Visit: Payer: Self-pay | Admitting: Family Medicine

## 2014-06-22 ENCOUNTER — Telehealth: Payer: Self-pay

## 2014-06-22 NOTE — Telephone Encounter (Signed)
Mrs Beougher request letter to excuse pt from jury duty permanently; neuropathy in legs and difficulty walking and pt gets SOB when walking. Pt is in w/c when goes out of home. Jury duty summons is for 08/02/14. Mrs Ulibarri is aware he does not have to serve due to pts age but to get pts name removed permanently for jury duty has to have letter from physician. Call Mrs Saunders when letter is ready.

## 2014-06-27 ENCOUNTER — Encounter: Payer: Self-pay | Admitting: Family Medicine

## 2014-06-27 NOTE — Telephone Encounter (Signed)
done

## 2014-06-27 NOTE — Telephone Encounter (Signed)
Mr. Reckner notified letter is ready to be picked up at front desk.

## 2014-07-24 ENCOUNTER — Emergency Department (HOSPITAL_COMMUNITY)
Admission: EM | Admit: 2014-07-24 | Discharge: 2014-07-24 | Disposition: A | Discharge status: Home / Self Care | Payer: Medicare Other | Attending: Emergency Medicine | Admitting: Emergency Medicine

## 2014-07-24 ENCOUNTER — Ambulatory Visit (INDEPENDENT_AMBULATORY_CARE_PROVIDER_SITE_OTHER): Payer: Medicare Other | Admitting: Internal Medicine

## 2014-07-24 ENCOUNTER — Encounter (HOSPITAL_COMMUNITY): Payer: Self-pay | Admitting: Emergency Medicine

## 2014-07-24 ENCOUNTER — Encounter: Payer: Self-pay | Admitting: Internal Medicine

## 2014-07-24 ENCOUNTER — Telehealth: Payer: Self-pay | Admitting: *Deleted

## 2014-07-24 VITALS — BP 150/90 | HR 103 | Temp 98.4°F

## 2014-07-24 DIAGNOSIS — R339 Retention of urine, unspecified: Secondary | ICD-10-CM

## 2014-07-24 DIAGNOSIS — R531 Weakness: Secondary | ICD-10-CM | POA: Diagnosis not present

## 2014-07-24 DIAGNOSIS — M1990 Unspecified osteoarthritis, unspecified site: Secondary | ICD-10-CM | POA: Insufficient documentation

## 2014-07-24 DIAGNOSIS — Z862 Personal history of diseases of the blood and blood-forming organs and certain disorders involving the immune mechanism: Secondary | ICD-10-CM | POA: Diagnosis not present

## 2014-07-24 DIAGNOSIS — Z791 Long term (current) use of non-steroidal anti-inflammatories (NSAID): Secondary | ICD-10-CM | POA: Insufficient documentation

## 2014-07-24 DIAGNOSIS — N390 Urinary tract infection, site not specified: Secondary | ICD-10-CM | POA: Diagnosis not present

## 2014-07-24 DIAGNOSIS — M109 Gout, unspecified: Secondary | ICD-10-CM | POA: Insufficient documentation

## 2014-07-24 DIAGNOSIS — I10 Essential (primary) hypertension: Secondary | ICD-10-CM | POA: Diagnosis not present

## 2014-07-24 DIAGNOSIS — Z87891 Personal history of nicotine dependence: Secondary | ICD-10-CM | POA: Diagnosis not present

## 2014-07-24 DIAGNOSIS — Z8639 Personal history of other endocrine, nutritional and metabolic disease: Secondary | ICD-10-CM | POA: Diagnosis not present

## 2014-07-24 DIAGNOSIS — Z79899 Other long term (current) drug therapy: Secondary | ICD-10-CM | POA: Diagnosis not present

## 2014-07-24 DIAGNOSIS — R197 Diarrhea, unspecified: Secondary | ICD-10-CM | POA: Diagnosis not present

## 2014-07-24 DIAGNOSIS — R1030 Lower abdominal pain, unspecified: Secondary | ICD-10-CM

## 2014-07-24 DIAGNOSIS — B9689 Other specified bacterial agents as the cause of diseases classified elsewhere: Secondary | ICD-10-CM | POA: Diagnosis not present

## 2014-07-24 DIAGNOSIS — Z8719 Personal history of other diseases of the digestive system: Secondary | ICD-10-CM | POA: Insufficient documentation

## 2014-07-24 DIAGNOSIS — R109 Unspecified abdominal pain: Secondary | ICD-10-CM | POA: Insufficient documentation

## 2014-07-24 LAB — I-STAT CHEM 8, ED
BUN: 9 mg/dL (ref 6–23)
CHLORIDE: 82 meq/L — AB (ref 96–112)
Calcium, Ion: 0.83 mmol/L — ABNORMAL LOW (ref 1.13–1.30)
Creatinine, Ser: 1.9 mg/dL — ABNORMAL HIGH (ref 0.50–1.35)
Glucose, Bld: 98 mg/dL (ref 70–99)
HCT: 32 % — ABNORMAL LOW (ref 39.0–52.0)
Hemoglobin: 10.9 g/dL — ABNORMAL LOW (ref 13.0–17.0)
POTASSIUM: 2.9 mmol/L — AB (ref 3.5–5.1)
Sodium: 125 mmol/L — ABNORMAL LOW (ref 135–145)
TCO2: 25 mmol/L (ref 0–100)

## 2014-07-24 LAB — CBC WITH DIFFERENTIAL/PLATELET
BASOS ABS: 0 10*3/uL (ref 0.0–0.1)
BASOS PCT: 0 % (ref 0–1)
EOS PCT: 0 % (ref 0–5)
Eosinophils Absolute: 0.1 10*3/uL (ref 0.0–0.7)
HCT: 26.1 % — ABNORMAL LOW (ref 39.0–52.0)
Hemoglobin: 9.8 g/dL — ABNORMAL LOW (ref 13.0–17.0)
LYMPHS PCT: 9 % — AB (ref 12–46)
Lymphs Abs: 2.2 10*3/uL (ref 0.7–4.0)
MCH: 37 pg — ABNORMAL HIGH (ref 26.0–34.0)
MCHC: 37.5 g/dL — ABNORMAL HIGH (ref 30.0–36.0)
MCV: 98.5 fL (ref 78.0–100.0)
Monocytes Absolute: 1.7 10*3/uL — ABNORMAL HIGH (ref 0.1–1.0)
Monocytes Relative: 7 % (ref 3–12)
Neutro Abs: 20.8 10*3/uL — ABNORMAL HIGH (ref 1.7–7.7)
Neutrophils Relative %: 84 % — ABNORMAL HIGH (ref 43–77)
PLATELETS: 215 10*3/uL (ref 150–400)
RBC: 2.65 MIL/uL — AB (ref 4.22–5.81)
RDW: 16.3 % — ABNORMAL HIGH (ref 11.5–15.5)
WBC: 24.8 10*3/uL — AB (ref 4.0–10.5)

## 2014-07-24 LAB — URINALYSIS, ROUTINE W REFLEX MICROSCOPIC
Bilirubin Urine: NEGATIVE
Glucose, UA: NEGATIVE mg/dL
KETONES UR: NEGATIVE mg/dL
Nitrite: NEGATIVE
Protein, ur: NEGATIVE mg/dL
Specific Gravity, Urine: 1.006 (ref 1.005–1.030)
Urobilinogen, UA: 0.2 mg/dL (ref 0.0–1.0)
pH: 7 (ref 5.0–8.0)

## 2014-07-24 LAB — URINE MICROSCOPIC-ADD ON

## 2014-07-24 MED ORDER — CEPHALEXIN 500 MG PO CAPS
500.0000 mg | ORAL_CAPSULE | ORAL | Status: AC
  Administered 2014-07-24: 500 mg via ORAL
  Filled 2014-07-24: qty 1

## 2014-07-24 MED ORDER — TAMSULOSIN HCL 0.4 MG PO CAPS: 0.4000 mg | ORAL_CAPSULE | Freq: Every day | ORAL | Status: DC

## 2014-07-24 MED ORDER — CEPHALEXIN 500 MG PO CAPS: 500.0000 mg | ORAL_CAPSULE | Freq: Three times a day (TID) | ORAL | Status: DC

## 2014-07-24 NOTE — Patient Instructions (Signed)
Please stop taking the lasix for the time being  Please got directly to Bob Wilson, where a catheter can be placed for the bladder, and you will likely need IV fluids as well  Please continue all other medications as before, and refills have been done if requested.  Please have the pharmacy call with any other refills you may need.  Please keep your appointments with your specialists as you may have planned

## 2014-07-24 NOTE — Telephone Encounter (Signed)
[facility] Patient Name: Bob Wilson Gender: Male DOB: 01-03-1933 Age: 81 Y 11 M 26 D Return Phone Number: [phone] (Primary) Address: [address] City/State/Zip: [place] [address] Client [facility] - Client Client Site [place] - Day Physician Copland, Spencer Contact Type Call Call Type Triage / Clinical Caller Name annie Vokes Relationship To Patient Spouse Return Phone Number [phone] (Primary) Chief Complaint Diarrhea Initial Comment caller states her husband is having diarrhea since Saturday PreDisposition Call Doctor Nurse Assessment Nurse: Marcelline Deist, RN, Kermit Balo Date/Time Eilene Ghazi Time): 07/24/2014 9:20:52 AM Confirm and document reason for call. If symptomatic, describe symptoms. ---Caller states her husband is having frequent & watery diarrhea since Saturday, has pain in middle of stomach. No fever. Has the patient traveled out of the country within the last 30 days? ---Not Applicable Does the patient require triage? ---Yes Related visit to physician within the last 2 weeks? ---No Does the PT have any chronic conditions? (i.e. diabetes, asthma, etc.) ---Yes List chronic conditions. ---neuropathy Guidelines Guideline Title Affirmed Question Affirmed Notes Nurse Date/Time (Eastern Time) Diarrhea [1] Age > 60 years AND [2] > 6 diarrhea stools in past 24 hours Marcelline Deist, Therapist, sports, Kermit Balo 07/24/2014 9:23:31 AM Disp. Time Eilene Ghazi Time) Disposition Final User 07/24/2014 9:35:06 AM See Physician within 4 Hours (or PCP triage) Yes Marcelline Deist, RN, Milana Kidney Understands: Yes Disagree/Comply: Comply PLEASE NOTE: All timestamps contained within this report are represented as [place] Standard Time. CONFIDENTIALTY NOTICE: This fax transmission is intended only for the addressee. It contains information that is legally  privileged, confidential or otherwise protected from use or disclosure. If you are not the intended recipient, you are strictly prohibited from reviewing, disclosing, copying using or disseminating any of this information or taking any action in reliance on or regarding this information. If you have received this fax in error, please notify us immediately by telephone so that we can arrange for its return to [place]. Phone: [phone], Toll-Free: [phone], Fax: [phone] Page: 2 of 2 Call Id: [id number] Care Advice Given Per Guideline SEE PHYSICIAN WITHIN 4 HOURS (or PCP triage): * IF NO PCP TRIAGE: You need to be seen. Go to _______________ (ED/UCC or office if it will be open) within the next 3 or 4 hours. Go sooner if you become worse. CLEAR FLUIDS: * Drink more fluids. * Sip water or a sports - rehydration drink (Gatorade or Powerade) CALL BACK IF: * You become worse. CARE ADVICE given per Diarrhea (Adult) guideline. After Care Instructions Given Call Event Type User Date / Time Description Comments User: Donald Siva, RN Date/Time Eilene Ghazi Time): 07/24/2014 9:36:43 AM Spoke with scheduling & there are no appts. available at office, but there is an appt. at the [facility] location at 11;15 am. If pt. is unable to go to that appt., will need to be seen at an UC or ER. Warm transferred to scheduler. Referrals REFERRED TO PCP OFFICE

## 2014-07-24 NOTE — ED Provider Notes (Signed)
CSN: [id number]     Arrival date & time 07/24/14  1221 History   First MD Initiated Contact with Patient 07/24/14 1327     Chief Complaint  Patient presents with  . Urinary Retention   (Consider location/radiation/quality/duration/timing/severity/associated sxs/prior Treatment) HPI Bob Wilson is a 81 yo with report of decreased urine output x 4 days.  Pt was seen in his PCP office today and sent here for eval for urinary retention.  He reports he has felt the urge to urinate but has only had a small amount out each time.  He denies any fever, chills, nausea, vomiting, or abd pain.   Past Medical History  Diagnosis Date  . Anemia     NOS  . Arthritis   . Gout   . Hyperlipidemia   . Hypertension   . GERD (gastroesophageal reflux disease)   . Elevated PSA     multiple times, refused urology eval [facility] notes)  . ETOH abuse   . Medically noncompliant     noncompliant with follow ups, labs.  . Alcoholism 06/25/2011  . Cirrhosis, alcoholic 3/41/9622  . 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 Lipoma excision     No family history on file. History  Substance Use Topics  . Smoking status: Former Smoker    Types: Cigarettes    Quit date: 07/20/1974  . Smokeless tobacco: Never Used     Comment: Remote- quit 45 years ago.  . Alcohol Use: 0.0 oz/week     Comment: rarely    Review of Systems  Constitutional: Negative for fever and chills.  HENT: Negative for sore throat.   Eyes: Negative for visual disturbance.  Respiratory: Negative for cough and shortness of breath.   Cardiovascular: Negative for chest pain and leg swelling.  Gastrointestinal: Negative for nausea, vomiting and diarrhea.  Genitourinary: Positive for decreased urine volume. Negative for dysuria, penile swelling and testicular pain.  Musculoskeletal: Negative for myalgias.  Skin: Negative for rash.  Neurological: Negative for weakness, numbness and  headaches.     Allergies  Ace inhibitors and Lisinopril  Home Medications   Prior to Admission medications   Medication Sig Start Date End Date Taking? Authorizing Provider  colchicine 0.6 MG tablet Take 0.6 mg by mouth 2 (two) times daily as needed (gout).    Historical Provider, MD  ENSURE (ENSURE) Take 237 mLs by mouth 2 (two) times daily.    Historical Provider, MD  furosemide (LASIX) 20 MG tablet Take 1 tablet (20 mg total) by mouth daily. If needed for swelling 01/11/14   Owens Loffler, MD  HYDROcodone-acetaminophen (NORCO/VICODIN) 5-325 MG per tablet Take 1 tablet by mouth every 6 (six) hours as needed. 01/18/14   Varney Biles, MD  ibuprofen (ADVIL,MOTRIN) 400 MG tablet Take 1 tablet (400 mg total) by mouth every 6 (six) hours as needed. 01/18/14   Varney Biles, MD  indomethacin (INDOCIN) 25 MG capsule take 1 capsule by mouth twice a day with meals 05/05/14   Owens Loffler, MD  naproxen sodium (ANAPROX) 220 MG tablet Take 440 mg by mouth 2 (two) times daily as needed (pain).    Historical Provider, MD   BP 155/75 mmHg  Pulse 109  Temp(Src) 98 F (36.7 C) (Axillary)  Resp 18  SpO2 98% Physical Exam  Constitutional: He is oriented to person, place, and time. He appears well-developed and well-nourished. No distress.  HENT:  Head: Normocephalic and atraumatic.  Mouth/Throat: Oropharynx  is clear and moist. No oropharyngeal exudate.  Eyes: Conjunctivae are normal.  Neck: Neck supple. No thyromegaly present.  Cardiovascular: Normal rate, regular rhythm and intact distal pulses.   Heart rate manually counted in the 90s on exam.   Pulmonary/Chest: Effort normal and breath sounds normal. No respiratory distress. He has no wheezes. He has no rales. He exhibits no tenderness.  Abdominal: Soft. He exhibits no distension. There is no tenderness. There is no rebound and no guarding.  Genitourinary:  Foley catheter in place draining cloudy urine  Musculoskeletal: He exhibits no  tenderness.  Lymphadenopathy:    He has no cervical adenopathy.  Neurological: He is alert and oriented to person, place, and time. GCS eye subscore is 4. GCS verbal subscore is 5. GCS motor subscore is 6.  Skin: Skin is warm and dry. No rash noted. He is not diaphoretic.  Psychiatric: He has a normal mood and affect.  Nursing note and vitals reviewed.   ED Course  Procedures (including critical care time) Labs Review Labs Reviewed  URINALYSIS, ROUTINE W REFLEX MICROSCOPIC - Abnormal; Notable for the following:    Hgb urine dipstick TRACE (*)    Leukocytes, UA LARGE (*)    All other components within normal limits  URINE MICROSCOPIC-ADD ON - Abnormal; Notable for the following:    Bacteria, UA FEW (*)    All other components within normal limits    Imaging Review No results found.   EKG Interpretation None      MDM   Final diagnoses:  Urinary retention  UTI (lower urinary tract infection)   81 yo male with urinary retention x 4 days, bladder drained of 617ml in the ED with placement of indwelling catheter.  Discussed case with Dr. Alvino Chapel.   CBC, I-stat 8, UA ordered. Labs reviewed. UA significant for large leukocytes, indicating UTI, WBC elevated to 24.8, consistent with retained infected urine. Despite elevated WBC, pt is afebrile, alert, and reports relief of previous abd pressure with placement and drainage of urine and indicates his appetite has returned and drinking well in the ED. First dose of antibiotic given in the ED. Potassium low at 2.9 but not repleted due to mildly elevated creatinine of 1.9. Expect to normalize now that urinary retention resolved.   Discussed with pt and family will leave catheter in place, and will treat urinary tract infection.  Discussed elevated WBC due to UTI but because pt is well-appearing, alert, afebrile and and able to eat,drink and be compliant with abx, reasonable to discharge with instructions to arrange a follow-up appointment  with urologist and his primary care provider.  Discussed strict return precautions to the ED if pt has fever, chills, nausea, change in appetite or mental status or any other concerning symptom. Pt is well-appearing, in no acute distress and vital signs are stable.  They appear safe to be discharged. P and family aware of plan and in agreement.   Of note, tachycardia noted in nursing documentation, heart rate counted manually by me at 90s prior to discharge.  Elevated heart rate noted with initial placement of pulse ox however would normalize after given time to establish accurate wave form.   Filed Vitals:   07/24/14 1244 07/24/14 1425 07/24/14 1540 07/24/14 1615  BP:  151/71  133/75  Pulse:  118 90 125  Temp: 98 F (36.7 C) 97.9 F (36.6 C) 98.8 F (37.1 C) 97.9 F (36.6 C)  TempSrc: Axillary Oral Oral Oral  Resp:  14 16 18  SpO2:  100% 98% 99%   Meds given in ED:  Medications  cephALEXin (KEFLEX) capsule 500 mg (500 mg Oral Given 07/24/14 1600)    Discharge Medication List as of 07/24/2014  3:35 PM    START taking these medications   Details  cephALEXin (KEFLEX) 500 MG capsule Take 1 capsule (500 mg total) by mouth 3 (three) times daily., Starting 07/24/2014, Until Discontinued, Print    tamsulosin (FLOMAX) 0.4 MG CAPS capsule Take 1 capsule (0.4 mg total) by mouth daily., Starting 07/24/2014, Until Discontinued, Print           Britt Bottom, NP 07/27/14 [place] Alvino Chapel, MD 07/27/14 [phone]

## 2014-07-24 NOTE — Progress Notes (Signed)
  CARE MANAGEMENT ED NOTE 07/24/2014  Patient:  Bob Wilson, Bob Wilson   Account Number:  [id number]  Date Initiated:  07/24/2014  Documentation initiated by:  Jackelyn Poling  Subjective/Objective Assessment:   81 yr old [employer] came to [facility] ED c/o urinary retention, treated and d/c home     Subjective/Objective Assessment Detail:   pcp [facility]  Mr weyer states he may need assistance at home because of he does not walk well because of his neuropathy He agreed to have ED Cm contact pcp or EDP on 07/25/14 to assist with orders if home health is possible  Pt gave CM permission to speak with his wife about "insurance matters"  and choice of home health agency  Pt confirms with CM he is resting tonight and prefers call from home health staff on 07/25/14 or later CM informed pt most home health agencies were closed at this time     Action/Plan:   18[facility] ED CM called and spoke to pt (after CM consulted by ED SW - when pt & wife stated they may need some services at home)   Action/Plan Detail:   Anticipated DC Date:  07/24/2014     Status Recommendation to Physician:   Result of Recommendation:    Other ED Services  Consult Working Plan   In-house referral  Clinical Social Worker   DC Forensic scientist  Other  Outpatient Services - Pt will follow up   [place]   Choice offered to / List presented to:  C-1 Patient          Status of service:  Completed, signed off  ED Comments:   ED Comments Detail:

## 2014-07-24 NOTE — Telephone Encounter (Signed)
Can you check on him. i would like to keep him out of the ER. If able to keep down liquids (water, gatorade) and still making urine, then I can see him tomorrow. OK to put in any of my slots.  (If they want to go ahead today, that is ok, too - his wife is very responsible)

## 2014-07-24 NOTE — Progress Notes (Signed)
CSW met with patient at bedside. Wife was present.  Per notes, patient is from [place]. Patient presents to the ED because of urine retention. Patient informed CSW that he has trouble walking,and uses a walker but is able to complete most of his ADL's.  Wife states that she is interested in home health. Patient informed CSW that he feels better than when he first came into the ED.  Wife/ Jaimeson Gopal [phone]  Willette Brace [phone] ED CSW 07/24/2014 4:42 PM

## 2014-07-24 NOTE — Assessment & Plan Note (Signed)
Acute x 3 days - ? Viral vs other, afeb, no blood  - doubt c diff, but differential includes secondary to urinary retention as well

## 2014-07-24 NOTE — Assessment & Plan Note (Signed)
Most likely related to distended bladder by my exam, may require other imaging however given the diarrhea - ? colitis

## 2014-07-24 NOTE — Discharge Instructions (Signed)
Please follow the directions provided.  Be sure to follow -up with the urology doctor to ensure you are getting better.  Take your meds as directed.  Don't hesitate to return for any new, worsening or concerning symptoms.     SEEK IMMEDIATE MEDICAL CARE IF:  You have severe back pain or lower abdominal pain.  You develop chills.  You have nausea or vomiting.  You have continued burning or discomfort with urination.

## 2014-07-24 NOTE — Assessment & Plan Note (Addendum)
liekly some element of dehydration on top of pain?  May need IVF's, likely should have basic cbc, labs as well, hold lasix

## 2014-07-24 NOTE — Telephone Encounter (Signed)
Looks like he is scheduled to see Dr. Jenny Reichmann today.

## 2014-07-24 NOTE — Assessment & Plan Note (Addendum)
Likely some acute vs subacute recent worsening, afeb but with tender bladder and pt reports very small stream, will need ER eval and likely foley placement, needs to r/o UTI/BPH  Note:  Total time for pt hx, exam, review of record with pt in the room, determination of diagnoses and plan for further eval and tx is > 40 min, with over 50% spent in coordination and counseling of patient

## 2014-07-24 NOTE — Progress Notes (Signed)
Subjective:    Patient ID: Bob Wilson, male    DOB: May 24, 1933, 81 y.o.   MRN: [id number]  HPI  Here as new pt to me for acute visit with wife who mentions he has memory problems (? Chronic) with CC of diarrhea x 3 days.  Pt is difficult historian, wife only knows what he tells her, but has had 3 days onset watery diarrhea; usually does not wear depends, but recalls he went through a whole container of 16 yesterday, tends to have episodes with trying to stand, has some minor nausea assoc (but not clear if related), no vomiting, no BRB or fever, but also is taking lasix 20 qd, also with worsening general weakness.  Also mentions ongoing issue ? Maybe worse with slow urinary flow,  But Denies urinary symptoms such as dysuria, frequency, urgency, flank pain, hematuria or fever, chills.   Past Medical History  Diagnosis Date  . Anemia     NOS  . Arthritis   . Gout   . Hyperlipidemia   . Hypertension   . GERD (gastroesophageal reflux disease)   . Elevated PSA     multiple times, refused urology eval [facility] notes)  . ETOH abuse   . Medically noncompliant     noncompliant with follow ups, labs.  . Alcoholism 06/25/2011  . Cirrhosis, alcoholic 0/35/0093  . 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 Lipoma excision      reports that he quit smoking about 40 years ago. His smoking use included Cigarettes. He smoked 0.00 packs per day. He has never used smokeless tobacco. He reports that he drinks alcohol. He reports that he does not use illicit drugs. family history is not on file. Allergies  Allergen Reactions  . Ace Inhibitors Other (See Comments)    REACTION: Angioedema  . Lisinopril Swelling and Other (See Comments)    REACTION: Facial Swelling   Current Outpatient Prescriptions on File Prior to Visit  Medication Sig Dispense Refill  . colchicine 0.6 MG tablet Take 0.6 mg by mouth 2 (two) times daily as needed (gout).    . ENSURE  (ENSURE) Take 237 mLs by mouth 2 (two) times daily.    . furosemide (LASIX) 20 MG tablet Take 1 tablet (20 mg total) by mouth daily. If needed for swelling 30 tablet 5  . HYDROcodone-acetaminophen (NORCO/VICODIN) 5-325 MG per tablet Take 1 tablet by mouth every 6 (six) hours as needed. 8 tablet 0  . ibuprofen (ADVIL,MOTRIN) 400 MG tablet Take 1 tablet (400 mg total) by mouth every 6 (six) hours as needed. 30 tablet 0  . indomethacin (INDOCIN) 25 MG capsule take 1 capsule by mouth twice a day with meals 60 capsule 5  . naproxen sodium (ANAPROX) 220 MG tablet Take 440 mg by mouth 2 (two) times daily as needed (pain).     No current facility-administered medications on file prior to visit.   Review of Systems - pt some confused, not clear how accurate  Constitutional: Negative for unusual diaphoresis or other sweats  HENT: Negative for ringing in ear Eyes: Negative for double vision or worsening visual disturbance.  Respiratory: Negative for choking and stridor.   Gastrointestinal: Negative for vomiting Genitourinary: Negative for hematuria Musculoskeletal: Negative for other MSK pain or swelling Skin: Negative for color change and worsening wound.  Neurological: Negative for tremors and numbness other than noted  Psychiatric/Behavioral: Negative for agitation     Objective:  Physical Exam BP 150/90 mmHg  Pulse 103  Temp(Src) 98.4 F (36.9 C) (Oral)  SpO2 96% VS noted,  Non toxic but weak, slow in movements, using wheelchair, requires assist to stand and get on exam table. Constitutional: Pt appears well-developed, well-nourished.  HENT: Head: NCAT.  Right Ear: External ear normal.  Left Ear: External ear normal.  Eyes: . Pupils are equal, round, and reactive to light. Conjunctivae and EOM are normal Neck: Normal range of motion. Neck supple.  Cardiovascular: Normal rate and regular rhythm.   Pulmonary/Chest: Effort normal and breath sounds without rales or wheezing.  Abd:   Soft,ND, + BS but with mod tender to low mid abd without guarding or rebound, with large palpable tender probable urinary bladder as well Neurological: Pt is alert. + spmewhat confused , motor grossly intact Skin: Skin is warm. No rash Psychiatric: Pt behavior is normal. No agitation.     Assessment & Plan:

## 2014-07-24 NOTE — ED Notes (Signed)
Pt from [place] c/o urinary retention. He reports he's had problems urinating since last Friday and is only "dribbling".

## 2014-07-24 NOTE — Progress Notes (Signed)
Pre visit review using our clinic review tool, if applicable. No additional management support is needed unless otherwise documented below in the visit note. 

## 2014-07-25 ENCOUNTER — Telehealth: Payer: Self-pay

## 2014-07-25 NOTE — Telephone Encounter (Signed)
Kim case mgr at [facility] ED left v/m; pt seen at [facility] ED on 07/24/14 with diarrhea and urinary retention;pt was discharged home; pt is interested in home health services and is going to discuss with his wife to see which home health agency they are interested in for assistance with walking. Kim request [facility] to help pt decide what home health services would be beneficial for pt.Please advise.

## 2014-07-25 NOTE — Telephone Encounter (Signed)
Mr. Councilman scheduled 07/26/2014 at 2:00pm with Dr. Lorelei Pont to follow up [facility] ED visit and to discuss home health services.

## 2014-07-25 NOTE — Telephone Encounter (Signed)
Medicare rules do not allow me to manage home health without face to face encounter.  It is probably not a bad idea for him to check in with me tomorrow anyway with recent ER visit. At the least next week.  Electronically Signed  By: Owens Loffler, MD On: 07/25/2014 12:12 PM

## 2014-07-25 NOTE — Progress Notes (Signed)
07/25/14 1007 called pcp ([facility] at [place]) office [phone] spoke with Terri then left a message for Lattie Haw in Triage including pt wanting home health service, pending choice of agency and need of orders. Left CM mobile contact number for a return call if needed

## 2014-07-26 ENCOUNTER — Encounter: Payer: Self-pay | Admitting: Family Medicine

## 2014-07-26 ENCOUNTER — Ambulatory Visit (INDEPENDENT_AMBULATORY_CARE_PROVIDER_SITE_OTHER): Payer: Medicare Other | Admitting: Family Medicine

## 2014-07-26 VITALS — BP 98/62 | HR 129 | Temp 97.7°F | Ht 72.75 in

## 2014-07-26 DIAGNOSIS — R972 Elevated prostate specific antigen [PSA]: Secondary | ICD-10-CM

## 2014-07-26 DIAGNOSIS — F1020 Alcohol dependence, uncomplicated: Secondary | ICD-10-CM

## 2014-07-26 DIAGNOSIS — K7030 Alcoholic cirrhosis of liver without ascites: Secondary | ICD-10-CM

## 2014-07-26 DIAGNOSIS — N390 Urinary tract infection, site not specified: Secondary | ICD-10-CM

## 2014-07-26 DIAGNOSIS — E46 Unspecified protein-calorie malnutrition: Secondary | ICD-10-CM | POA: Diagnosis not present

## 2014-07-26 DIAGNOSIS — R339 Retention of urine, unspecified: Secondary | ICD-10-CM

## 2014-07-26 LAB — URINE CULTURE
Colony Count: >=100000
Special Requests: NORMAL

## 2014-07-26 NOTE — Progress Notes (Signed)
Pre visit review using our clinic review tool, if applicable. No additional management support is needed unless otherwise documented below in the visit note. 

## 2014-07-26 NOTE — Progress Notes (Signed)
Dr. Frederico Hamman T. Clovis Warwick, MD, [facility] [place], [address] Phone: [phone] Fax: [phone]  07/26/2014  Patient: Bob Wilson, MRN: [id number], DOB: [birth year], 81 y.o.  Primary Physician:  Owens Loffler, MD  Chief Complaint: Follow-up  Subjective:   Bob Wilson is a 81 y.o. very pleasant male patient who presents with the following:  F/u ER. Urinary retention and UTI.  No this patient very well.  He has a history of severe chronic alcoholism, but he and his wife tell me he has not been drinking over the last few days when he is been so sick.  He went to the emergency room on Monday with urinary retention and a urinary tract infection with a white count of 25,000 and LEFT shift.  They were able to place a catheter, and the patient is currently wearing a bag.  He is feeling much better, he has been taking his Keflex and clinically feels much better.  He has been able to drink a couple of cans of ensure a day over the last 2 days.  Other than this, per his wife, he is been taking in minimal food recently.  He has a lot of problems with pain and knee pain, and he is minimally ambulatory now.  He does use a rolling walker at home.  Today in the office he is in a wheelchair.  He has been intermittently fairly Noncompliant in the past number of years.  Relative to his current state, he LEFT his prior doctor when they on multiple occasions tried to get him to go to urology to evaluate his elevated PSA.  My most recent PSA was approximately 15, and again at that time he did not want to have any further evaluation.  This may be relevant to his current urinary retention.  Minimal food.  A couple of cans of Ensure a day.   Still eating only a little bit.  No ETOH.   Urology follow-up on Tuesday.  Past Medical History, Surgical History, Social History, Family History, Problem List, Medications, and Allergies have been  reviewed and updated if relevant.  ROS: GEN: Acute illness details above GI: Tolerating PO intake GU: maintaining adequate hydration and urination Pulm: No SOB Interactive and getting along well at home.  Otherwise, ROS is as per the HPI.   Objective:   BP 98/62 mmHg  Pulse 129  Temp(Src) 97.7 F (36.5 C) (Oral)  Ht 6' 0.75" (1.848 m)  Wt    GEN: WDWN, NAD, Non-toxic, A & O x 3. Mildly ill apeparing. HEENT: Atraumatic, Normocephalic. Neck supple. No masses, No LAD. Ears and Nose: No external deformity. CV: RRR, No M/G/R. No JVD. No thrill. No extra heart sounds. PULM: CTA B, no wheezes, crackles, rhonchi. No retractions. No resp. distress. No accessory muscle use. EXTR: No c/c/ 1+ LE edema NEURO Normal gait.  PSYCH: Normally interactive. Conversant. Not depressed or anxious appearing.  Calm demeanor.     Laboratory and Imaging Data: Recent Results (from the past 2160 hour(s))  U/A (may I&O cath if menses)     Status: Abnormal   Collection Time: 07/24/14 12:47 PM  Result Value Ref Range   Color, Urine YELLOW YELLOW   APPearance CLEAR CLEAR   Specific Gravity, Urine 1.006 1.005 - 1.030   pH 7.0 5.0 - 8.0   Glucose, UA NEGATIVE NEGATIVE mg/dL   Hgb urine dipstick TRACE (A) NEGATIVE   Bilirubin Urine NEGATIVE  NEGATIVE   Ketones, ur NEGATIVE NEGATIVE mg/dL   Protein, ur NEGATIVE NEGATIVE mg/dL   Urobilinogen, UA 0.2 0.0 - 1.0 mg/dL   Nitrite NEGATIVE NEGATIVE   Leukocytes, UA LARGE (A) NEGATIVE  Urine microscopic-add on     Status: Abnormal   Collection Time: 07/24/14 12:47 PM  Result Value Ref Range   Squamous Epithelial / LPF RARE RARE   WBC, UA 11-20 <3 WBC/hpf   RBC / HPF 0-2 <3 RBC/hpf   Bacteria, UA FEW (A) RARE  CBC with Differential     Status: Abnormal   Collection Time: 07/24/14  2:30 PM  Result Value Ref Range   WBC 24.8 (H) 4.0 - 10.5 K/uL   RBC 2.65 (L) 4.22 - 5.81 MIL/uL   Hemoglobin 9.8 (L) 13.0 - 17.0 g/dL   HCT 26.1 (L) 39.0 - 52.0 %   MCV  98.5 78.0 - 100.0 fL   MCH 37.0 (H) 26.0 - 34.0 pg   MCHC 37.5 (H) 30.0 - 36.0 g/dL    Comment: TARGET CELLS   RDW 16.3 (H) 11.5 - 15.5 %   Platelets 215 150 - 400 K/uL   Neutrophils Relative % 84 (H) 43 - 77 %   Neutro Abs 20.8 (H) 1.7 - 7.7 K/uL   Lymphocytes Relative 9 (L) 12 - 46 %   Lymphs Abs 2.2 0.7 - 4.0 K/uL   Monocytes Relative 7 3 - 12 %   Monocytes Absolute 1.7 (H) 0.1 - 1.0 K/uL   Eosinophils Relative 0 0 - 5 %   Eosinophils Absolute 0.1 0.0 - 0.7 K/uL   Basophils Relative 0 0 - 1 %   Basophils Absolute 0.0 0.0 - 0.1 K/uL  I-stat chem 8, ed     Status: Abnormal   Collection Time: 07/24/14  2:41 PM  Result Value Ref Range   Sodium 125 (L) 135 - 145 mmol/L   Potassium 2.9 (L) 3.5 - 5.1 mmol/L   Chloride 82 (L) 96 - 112 mEq/L   BUN 9 6 - 23 mg/dL   Creatinine, Ser 1.90 (H) 0.50 - 1.35 mg/dL   Glucose, Bld 98 70 - 99 mg/dL   Calcium, Ion 0.83 (L) 1.13 - 1.30 mmol/L   TCO2 25 0 - 100 mmol/L   Hemoglobin 10.9 (L) 13.0 - 17.0 g/dL   HCT 32.0 (L) 39.0 - 52.0 %   Comment NOTIFIED PHYSICIAN   Urine culture     Status: None   Collection Time: 07/24/14  2:57 PM  Result Value Ref Range   Specimen Description URINE, RANDOM    Special Requests Normal    Colony Count      >=100,000 COLONIES/ML Performed at [facility] (COAGULASE NEGATIVE) Note: RIFAMPIN AND GENTAMICIN SHOULD NOT BE USED AS SINGLE DRUGS FOR TREATMENT OF STAPH INFECTIONS. Performed at [facility]    Report Status 07/26/2014 FINAL    Organism ID, Bacteria STAPHYLOCOCCUS SPECIES (COAGULASE NEGATIVE)       Susceptibility   Staphylococcus species (coagulase negative) - MIC*    GENTAMICIN <=0.5 SENSITIVE Sensitive     LEVOFLOXACIN <=0.12 SENSITIVE Sensitive     NITROFURANTOIN <=16 SENSITIVE Sensitive     OXACILLIN >=4 RESISTANT Resistant     PENICILLIN >=0.5 RESISTANT Resistant     RIFAMPIN <=0.5 SENSITIVE Sensitive     TRIMETH/SULFA <=10 SENSITIVE  Sensitive     VANCOMYCIN 1 SENSITIVE Sensitive     TETRACYCLINE 2 SENSITIVE  Sensitive     * STAPHYLOCOCCUS SPECIES (COAGULASE NEGATIVE)     Assessment and Plan:   Urinary retention  PROSTATE SPECIFIC ANTIGEN, ELEVATED  Protein-calorie malnutrition  Alcoholic cirrhosis of liver without ascites  Continuous chronic alcoholism  Urinary tract infection without hematuria, site unspecified  Clinically improving from urinary tract infection.  Cephalosporins were not checked for sensitivities, but he does tell me that he feels better, and his wife thinks he is doing better.  Continue Keflex.  Currently with Foley catheter.  Follow-up with urology on Tuesday.  Encouraged him to drink more water daily and to try to eat.  He has been drinking heavily daily for 60 years. Challenging.   In follow-up to the hospital's recommendation, also discussed with the patient and his wife, and I do think that home health would be a reasonable idea.  The effectively do not have any money to pay for it at all, so I think that I am going to get tried healthcare network to come and evaluate this high risk patient.  Hopefully we can get some additional services for assistance for them at home.  Signed,  Maud Deed. Deen Deguia, MD   Patient's Medications  New Prescriptions   No medications on file  Previous Medications   CEPHALEXIN (KEFLEX) 500 MG CAPSULE    Take 1 capsule (500 mg total) by mouth 3 (three) times daily.   COLCHICINE 0.6 MG TABLET    Take 0.6 mg by mouth 2 (two) times daily as needed (gout).   ENSURE (ENSURE)    Take 237 mLs by mouth 2 (two) times daily.   FUROSEMIDE (LASIX) 20 MG TABLET    Take 1 tablet (20 mg total) by mouth daily. If needed for swelling   HYDROCODONE-ACETAMINOPHEN (NORCO/VICODIN) 5-325 MG PER TABLET    Take 1 tablet by mouth every 6 (six) hours as needed.   IBUPROFEN (ADVIL,MOTRIN) 400 MG TABLET    Take 1 tablet (400 mg total) by mouth every 6 (six) hours as needed.    INDOMETHACIN (INDOCIN) 25 MG CAPSULE    take 1 capsule by mouth twice a day with meals   NAPROXEN SODIUM (ANAPROX) 220 MG TABLET    Take 440 mg by mouth 2 (two) times daily as needed (pain).   TAMSULOSIN (FLOMAX) 0.4 MG CAPS CAPSULE    Take 1 capsule (0.4 mg total) by mouth daily.  Modified Medications   No medications on file  Discontinued Medications   No medications on file

## 2014-07-27 ENCOUNTER — Telehealth (HOSPITAL_COMMUNITY): Payer: Self-pay

## 2014-07-27 DIAGNOSIS — N390 Urinary tract infection, site not specified: Secondary | ICD-10-CM | POA: Insufficient documentation

## 2014-07-27 NOTE — Progress Notes (Signed)
ED Antimicrobial Stewardship Positive Culture Follow Up   Bob Wilson is an 80 y.o. male who presented to [facility] on 07/24/2014 with a chief complaint of  Chief Complaint  Patient presents with  . Urinary Retention    Recent Results (from the past 720 hour(s))  Urine culture     Status: None   Collection Time: 07/24/14  2:57 PM  Result Value Ref Range Status   Specimen Description URINE, RANDOM  Final   Special Requests Normal  Final   Colony Count   Final    >=100,000 COLONIES/ML Performed at [facility]    Culture   Final    STAPHYLOCOCCUS SPECIES (COAGULASE NEGATIVE) Note: RIFAMPIN AND GENTAMICIN SHOULD NOT BE USED AS SINGLE DRUGS FOR TREATMENT OF STAPH INFECTIONS. Performed at [facility]    Report Status 07/26/2014 FINAL  Final   Organism ID, Bacteria STAPHYLOCOCCUS SPECIES (COAGULASE NEGATIVE)  Final      Susceptibility   Staphylococcus species (coagulase negative) - MIC*    GENTAMICIN <=0.5 SENSITIVE Sensitive     LEVOFLOXACIN <=0.12 SENSITIVE Sensitive     NITROFURANTOIN <=16 SENSITIVE Sensitive     OXACILLIN >=4 RESISTANT Resistant     PENICILLIN >=0.5 RESISTANT Resistant     RIFAMPIN <=0.5 SENSITIVE Sensitive     TRIMETH/SULFA <=10 SENSITIVE Sensitive     VANCOMYCIN 1 SENSITIVE Sensitive     TETRACYCLINE 2 SENSITIVE Sensitive     * STAPHYLOCOCCUS SPECIES (COAGULASE NEGATIVE)    [x]  Treated with Cephalexin, organism resistant to prescribed antimicrobial  New antibiotic prescription: Septra DS 1 tab PO BID x 7 days  ED Provider: Glendell Docker, NP   Norva Riffle 07/27/2014, 10:47 AM Infectious Diseases Pharmacist Phone# [phone]

## 2014-07-27 NOTE — Telephone Encounter (Signed)
Post ED Visit - Positive Culture Follow-up: Successful Patient Follow-Up  Culture assessed and recommendations reviewed by: []  Wes [place], Pharm.D., BCPS []  Heide Guile, Pharm.D., BCPS []  Alycia Rossetti, Pharm.D., BCPS []  [place]ta, Pharm.D., BCPS, AAHIVP [x]  Legrand Como, Pharm.D., BCPS, AAHIVP []  Hassie Bruce, Pharm.D. []  Milus Glazier, [place].D.  Positive Urine culture, >/= 100,000 colonies -> Staph Species  []  Patient discharged without antimicrobial prescription and treatment is now indicated [x]  Organism is resistant to prescribed ED discharge antimicrobial, Keflex. []  Patient with positive blood cultures  Changes discussed with ED provider: Aleene Davidson FNP New antibiotic prescription "Septra DS 1 tab po BID x 7 days" Called to [facility] [phone], Rx given to [facility].  Contacted patient, date 07/27/14, time 11:33.   Pt informed of dx and need for additional tx and to stop Keflex.   Dortha Kern 07/27/2014, 11:40 AM

## 2014-07-31 DIAGNOSIS — N390 Urinary tract infection, site not specified: Secondary | ICD-10-CM | POA: Diagnosis not present

## 2014-07-31 DIAGNOSIS — R338 Other retention of urine: Secondary | ICD-10-CM | POA: Diagnosis not present

## 2014-08-02 ENCOUNTER — Encounter (HOSPITAL_COMMUNITY): Payer: Self-pay | Admitting: Thoracic Surgery

## 2014-08-17 ENCOUNTER — Telehealth: Payer: Self-pay

## 2014-08-17 NOTE — Telephone Encounter (Signed)
Mrs Cammarata left v/m that the nurse wanted pt to get a light weight wheelchair. I returned call and no answer and no v/m.

## 2014-08-23 DIAGNOSIS — R338 Other retention of urine: Secondary | ICD-10-CM | POA: Diagnosis not present

## 2014-09-02 ENCOUNTER — Emergency Department (HOSPITAL_COMMUNITY): Payer: Medicare Other

## 2014-09-02 ENCOUNTER — Inpatient Hospital Stay (HOSPITAL_COMMUNITY)
Admission: EM | Admit: 2014-09-02 | Discharge: 2014-09-06 | DRG: 698 | Disposition: A | Discharge status: Home Health | Payer: Medicare Other | Attending: Family Medicine | Admitting: Family Medicine

## 2014-09-02 ENCOUNTER — Encounter (HOSPITAL_COMMUNITY): Payer: Self-pay | Admitting: Emergency Medicine

## 2014-09-02 DIAGNOSIS — R531 Weakness: Secondary | ICD-10-CM | POA: Diagnosis not present

## 2014-09-02 DIAGNOSIS — Z9119 Patient's noncompliance with other medical treatment and regimen: Secondary | ICD-10-CM | POA: Diagnosis not present

## 2014-09-02 DIAGNOSIS — E8770 Fluid overload, unspecified: Secondary | ICD-10-CM | POA: Diagnosis present

## 2014-09-02 DIAGNOSIS — I872 Venous insufficiency (chronic) (peripheral): Secondary | ICD-10-CM | POA: Diagnosis not present

## 2014-09-02 DIAGNOSIS — Z91199 Patient's noncompliance with other medical treatment and regimen due to unspecified reason: Secondary | ICD-10-CM

## 2014-09-02 DIAGNOSIS — D72829 Elevated white blood cell count, unspecified: Secondary | ICD-10-CM | POA: Diagnosis present

## 2014-09-02 DIAGNOSIS — Z87440 Personal history of urinary (tract) infections: Secondary | ICD-10-CM

## 2014-09-02 DIAGNOSIS — E871 Hypo-osmolality and hyponatremia: Secondary | ICD-10-CM | POA: Diagnosis present

## 2014-09-02 DIAGNOSIS — R339 Retention of urine, unspecified: Secondary | ICD-10-CM | POA: Diagnosis present

## 2014-09-02 DIAGNOSIS — A419 Sepsis, unspecified organism: Secondary | ICD-10-CM | POA: Diagnosis present

## 2014-09-02 DIAGNOSIS — D649 Anemia, unspecified: Secondary | ICD-10-CM | POA: Diagnosis not present

## 2014-09-02 DIAGNOSIS — L509 Urticaria, unspecified: Secondary | ICD-10-CM | POA: Diagnosis not present

## 2014-09-02 DIAGNOSIS — K7030 Alcoholic cirrhosis of liver without ascites: Secondary | ICD-10-CM | POA: Diagnosis not present

## 2014-09-02 DIAGNOSIS — I10 Essential (primary) hypertension: Secondary | ICD-10-CM | POA: Diagnosis not present

## 2014-09-02 DIAGNOSIS — K219 Gastro-esophageal reflux disease without esophagitis: Secondary | ICD-10-CM | POA: Diagnosis present

## 2014-09-02 DIAGNOSIS — I4891 Unspecified atrial fibrillation: Secondary | ICD-10-CM | POA: Diagnosis present

## 2014-09-02 DIAGNOSIS — N390 Urinary tract infection, site not specified: Secondary | ICD-10-CM | POA: Diagnosis present

## 2014-09-02 DIAGNOSIS — F1020 Alcohol dependence, uncomplicated: Secondary | ICD-10-CM | POA: Diagnosis present

## 2014-09-02 DIAGNOSIS — R7881 Bacteremia: Secondary | ICD-10-CM | POA: Diagnosis not present

## 2014-09-02 DIAGNOSIS — Z9181 History of falling: Secondary | ICD-10-CM

## 2014-09-02 DIAGNOSIS — T8351XA Infection and inflammatory reaction due to indwelling urinary catheter, initial encounter: Principal | ICD-10-CM | POA: Diagnosis present

## 2014-09-02 DIAGNOSIS — E785 Hyperlipidemia, unspecified: Secondary | ICD-10-CM | POA: Diagnosis present

## 2014-09-02 DIAGNOSIS — E876 Hypokalemia: Secondary | ICD-10-CM | POA: Diagnosis present

## 2014-09-02 DIAGNOSIS — E860 Dehydration: Secondary | ICD-10-CM | POA: Diagnosis present

## 2014-09-02 DIAGNOSIS — J984 Other disorders of lung: Secondary | ICD-10-CM | POA: Diagnosis not present

## 2014-09-02 DIAGNOSIS — R079 Chest pain, unspecified: Secondary | ICD-10-CM | POA: Diagnosis not present

## 2014-09-02 DIAGNOSIS — R404 Transient alteration of awareness: Secondary | ICD-10-CM | POA: Diagnosis not present

## 2014-09-02 DIAGNOSIS — Z87891 Personal history of nicotine dependence: Secondary | ICD-10-CM

## 2014-09-02 DIAGNOSIS — L03115 Cellulitis of right lower limb: Secondary | ICD-10-CM | POA: Diagnosis present

## 2014-09-02 DIAGNOSIS — J9811 Atelectasis: Secondary | ICD-10-CM | POA: Diagnosis not present

## 2014-09-02 DIAGNOSIS — Z79899 Other long term (current) drug therapy: Secondary | ICD-10-CM | POA: Diagnosis not present

## 2014-09-02 DIAGNOSIS — R2681 Unsteadiness on feet: Secondary | ICD-10-CM | POA: Diagnosis not present

## 2014-09-02 DIAGNOSIS — L03116 Cellulitis of left lower limb: Secondary | ICD-10-CM

## 2014-09-02 LAB — COMPREHENSIVE METABOLIC PANEL
ALT: 17 U/L (ref 0–53)
AST: 34 U/L (ref 0–37)
Albumin: 2.3 g/dL — ABNORMAL LOW (ref 3.5–5.2)
Alkaline Phosphatase: 156 U/L — ABNORMAL HIGH (ref 39–117)
Anion gap: 12 (ref 5–15)
BUN: 9 mg/dL (ref 6–23)
CALCIUM: 7.3 mg/dL — AB (ref 8.4–10.5)
CO2: 28 mmol/L (ref 19–32)
CREATININE: 0.97 mg/dL (ref 0.50–1.35)
Chloride: 91 mmol/L — ABNORMAL LOW (ref 96–112)
GFR calc Af Amer: 88 mL/min — ABNORMAL LOW (ref >90)
GFR, EST NON AFRICAN AMERICAN: 76 mL/min — AB (ref >90)
GLUCOSE: 109 mg/dL — AB (ref 70–99)
Potassium: 2.7 mmol/L — CL (ref 3.5–5.1)
Sodium: 131 mmol/L — ABNORMAL LOW (ref 135–145)
Total Bilirubin: 2.4 mg/dL — ABNORMAL HIGH (ref 0.3–1.2)
Total Protein: 6.3 g/dL (ref 6.0–8.3)

## 2014-09-02 LAB — URINALYSIS, ROUTINE W REFLEX MICROSCOPIC
BILIRUBIN URINE: NEGATIVE
Glucose, UA: NEGATIVE mg/dL
KETONES UR: NEGATIVE mg/dL
Nitrite: POSITIVE — AB
Protein, ur: 100 mg/dL — AB
SPECIFIC GRAVITY, URINE: 1.011 (ref 1.005–1.030)
Urobilinogen, UA: 1 mg/dL (ref 0.0–1.0)
pH: 7 (ref 5.0–8.0)

## 2014-09-02 LAB — CBC WITH DIFFERENTIAL/PLATELET
BASOS ABS: 0 10*3/uL (ref 0.0–0.1)
Basophils Relative: 0 % (ref 0–1)
EOS ABS: 0 10*3/uL (ref 0.0–0.7)
EOS PCT: 0 % (ref 0–5)
HEMATOCRIT: 25.9 % — AB (ref 39.0–52.0)
HEMOGLOBIN: 9.5 g/dL — AB (ref 13.0–17.0)
Lymphocytes Relative: 12 % (ref 12–46)
Lymphs Abs: 2.1 10*3/uL (ref 0.7–4.0)
MCH: 36.4 pg — ABNORMAL HIGH (ref 26.0–34.0)
MCHC: 36.7 g/dL — ABNORMAL HIGH (ref 30.0–36.0)
MCV: 99.2 fL (ref 78.0–100.0)
MONOS PCT: 17 % — AB (ref 3–12)
Monocytes Absolute: 3.1 10*3/uL — ABNORMAL HIGH (ref 0.1–1.0)
NEUTROS PCT: 71 % (ref 43–77)
Neutro Abs: 12.8 10*3/uL — ABNORMAL HIGH (ref 1.7–7.7)
Platelets: 195 10*3/uL (ref 150–400)
RBC: 2.61 MIL/uL — ABNORMAL LOW (ref 4.22–5.81)
RDW: 17.6 % — ABNORMAL HIGH (ref 11.5–15.5)
WBC: 18 10*3/uL — ABNORMAL HIGH (ref 4.0–10.5)

## 2014-09-02 LAB — MAGNESIUM: Magnesium: 1.1 mg/dL — ABNORMAL LOW (ref 1.5–2.5)

## 2014-09-02 LAB — TROPONIN I: Troponin I: <0.03 ng/mL (ref <0.031)

## 2014-09-02 LAB — I-STAT CG4 LACTIC ACID, ED: LACTIC ACID, VENOUS: 2.91 mmol/L — AB (ref 0.5–2.0)

## 2014-09-02 LAB — MRSA PCR SCREENING: MRSA BY PCR: NEGATIVE

## 2014-09-02 LAB — PROTIME-INR
INR: 1.24 (ref 0.00–1.49)
PROTHROMBIN TIME: 15.7 s — AB (ref 11.6–15.2)

## 2014-09-02 LAB — URINE MICROSCOPIC-ADD ON

## 2014-09-02 LAB — BRAIN NATRIURETIC PEPTIDE: B Natriuretic Peptide: 431 pg/mL — ABNORMAL HIGH (ref 0.0–100.0)

## 2014-09-02 MED ORDER — LORAZEPAM 1 MG PO TABS
0.0000 mg | ORAL_TABLET | Freq: Four times a day (QID) | ORAL | Status: AC
  Administered 2014-09-03 – 2014-09-04 (×2): 1 mg via ORAL
  Filled 2014-09-02 (×3): qty 1

## 2014-09-02 MED ORDER — POTASSIUM CHLORIDE CRYS ER 20 MEQ PO TBCR
40.0000 meq | EXTENDED_RELEASE_TABLET | ORAL | Status: AC
  Administered 2014-09-02 (×2): 40 meq via ORAL
  Filled 2014-09-02 (×2): qty 2

## 2014-09-02 MED ORDER — FOLIC ACID 1 MG PO TABS
1.0000 mg | ORAL_TABLET | Freq: Every day | ORAL | Status: DC
  Administered 2014-09-02 – 2014-09-06 (×5): 1 mg via ORAL
  Filled 2014-09-02 (×5): qty 1

## 2014-09-02 MED ORDER — ALBUTEROL SULFATE (2.5 MG/3ML) 0.083% IN NEBU
2.5000 mg | INHALATION_SOLUTION | RESPIRATORY_TRACT | Status: DC | PRN
  Administered 2014-09-04: 2.5 mg via RESPIRATORY_TRACT
  Filled 2014-09-02: qty 3

## 2014-09-02 MED ORDER — LORAZEPAM 1 MG PO TABS: 1.0000 mg | ORAL_TABLET | Freq: Four times a day (QID) | ORAL | Status: AC | PRN

## 2014-09-02 MED ORDER — CEFTRIAXONE SODIUM 2 G IJ SOLR: 2.0000 g | Freq: Once | INTRAMUSCULAR | Status: DC

## 2014-09-02 MED ORDER — THIAMINE HCL 100 MG/ML IJ SOLN
100.0000 mg | Freq: Every day | INTRAMUSCULAR | Status: DC
  Filled 2014-09-02 (×2): qty 1

## 2014-09-02 MED ORDER — LORAZEPAM 2 MG/ML IJ SOLN
1.0000 mg | Freq: Four times a day (QID) | INTRAMUSCULAR | Status: AC | PRN
  Filled 2014-09-02: qty 1

## 2014-09-02 MED ORDER — DEXTROSE 5 % IV SOLN
5.0000 mg/h | INTRAVENOUS | Status: DC
  Administered 2014-09-02: 15 mg/h via INTRAVENOUS
  Administered 2014-09-03: 10 mg/h via INTRAVENOUS
  Filled 2014-09-02: qty 100

## 2014-09-02 MED ORDER — LORAZEPAM 1 MG PO TABS
0.0000 mg | ORAL_TABLET | Freq: Two times a day (BID) | ORAL | Status: AC
Start: 2014-09-04 — End: 2014-09-06

## 2014-09-02 MED ORDER — DEXTROSE 5 % IV SOLN
5.0000 mg/h | Freq: Once | INTRAVENOUS | Status: AC
  Administered 2014-09-02: 5 mg/h via INTRAVENOUS
  Filled 2014-09-02: qty 100

## 2014-09-02 MED ORDER — SODIUM CHLORIDE 0.9 % IJ SOLN
3.0000 mL | Freq: Two times a day (BID) | INTRAMUSCULAR | Status: DC
  Administered 2014-09-02 – 2014-09-05 (×6): 3 mL via INTRAVENOUS

## 2014-09-02 MED ORDER — DEXTROSE 5 % IV SOLN
2.0000 g | INTRAVENOUS | Status: AC
  Administered 2014-09-02: 2 g via INTRAVENOUS
  Filled 2014-09-02: qty 2

## 2014-09-02 MED ORDER — POTASSIUM CHLORIDE 10 MEQ/100ML IV SOLN
10.0000 meq | Freq: Once | INTRAVENOUS | Status: AC
  Administered 2014-09-02: 10 meq via INTRAVENOUS
  Filled 2014-09-02: qty 100

## 2014-09-02 MED ORDER — SODIUM CHLORIDE 0.9 % IV SOLN
INTRAVENOUS | Status: DC
  Administered 2014-09-02 (×2): via INTRAVENOUS

## 2014-09-02 MED ORDER — ACETAMINOPHEN 325 MG PO TABS
650.0000 mg | ORAL_TABLET | Freq: Once | ORAL | Status: AC
  Administered 2014-09-02: 650 mg via ORAL
  Filled 2014-09-02: qty 2

## 2014-09-02 MED ORDER — ADULT MULTIVITAMIN W/MINERALS CH
1.0000 | ORAL_TABLET | Freq: Every day | ORAL | Status: DC
  Administered 2014-09-02 – 2014-09-06 (×5): 1 via ORAL
  Filled 2014-09-02 (×5): qty 1

## 2014-09-02 MED ORDER — ONDANSETRON HCL 4 MG/2ML IJ SOLN: 4.0000 mg | Freq: Four times a day (QID) | INTRAMUSCULAR | Status: DC | PRN

## 2014-09-02 MED ORDER — SODIUM CHLORIDE 0.9 % IV BOLUS (SEPSIS)
1000.0000 mL | INTRAVENOUS | Status: AC
  Administered 2014-09-02 (×2): 1000 mL via INTRAVENOUS

## 2014-09-02 MED ORDER — DILTIAZEM LOAD VIA INFUSION
20.0000 mg | Freq: Once | INTRAVENOUS | Status: AC
  Administered 2014-09-02: 20 mg via INTRAVENOUS
  Filled 2014-09-02: qty 20

## 2014-09-02 MED ORDER — ACETAMINOPHEN 650 MG RE SUPP: 650.0000 mg | Freq: Four times a day (QID) | RECTAL | Status: DC | PRN

## 2014-09-02 MED ORDER — DIPHENHYDRAMINE HCL 50 MG/ML IJ SOLN
25.0000 mg | INTRAMUSCULAR | Status: AC
  Administered 2014-09-02: 25 mg via INTRAVENOUS

## 2014-09-02 MED ORDER — ONDANSETRON HCL 4 MG PO TABS
4.0000 mg | ORAL_TABLET | Freq: Four times a day (QID) | ORAL | Status: DC | PRN
  Administered 2014-09-03: 4 mg via ORAL
  Filled 2014-09-02: qty 1

## 2014-09-02 MED ORDER — DEXTROSE 5 % IV SOLN
1.0000 g | INTRAVENOUS | Status: DC
  Administered 2014-09-03: 1 g via INTRAVENOUS
  Filled 2014-09-02: qty 10

## 2014-09-02 MED ORDER — VITAMIN B-1 100 MG PO TABS
100.0000 mg | ORAL_TABLET | Freq: Every day | ORAL | Status: DC
  Administered 2014-09-02 – 2014-09-06 (×5): 100 mg via ORAL
  Filled 2014-09-02 (×5): qty 1

## 2014-09-02 MED ORDER — ACETAMINOPHEN 325 MG PO TABS: 650.0000 mg | ORAL_TABLET | Freq: Four times a day (QID) | ORAL | Status: DC | PRN

## 2014-09-02 MED ORDER — ENOXAPARIN SODIUM 40 MG/0.4ML ~~LOC~~ SOLN
40.0000 mg | SUBCUTANEOUS | Status: DC
  Administered 2014-09-03 – 2014-09-06 (×4): 40 mg via SUBCUTANEOUS
  Filled 2014-09-02 (×4): qty 0.4

## 2014-09-02 MED ORDER — DIPHENHYDRAMINE HCL 50 MG/ML IJ SOLN
INTRAMUSCULAR | Status: AC
  Filled 2014-09-02: qty 1

## 2014-09-02 MED ORDER — DILTIAZEM HCL 25 MG/5ML IV SOLN: 20.0000 mg | Freq: Once | INTRAVENOUS | Status: DC

## 2014-09-02 MED ORDER — POTASSIUM CHLORIDE IN NACL 40-0.9 MEQ/L-% IV SOLN
INTRAVENOUS | Status: DC
  Administered 2014-09-02 – 2014-09-03 (×2): 125 mL/h via INTRAVENOUS
  Filled 2014-09-02 (×3): qty 1000

## 2014-09-02 NOTE — Consult Note (Signed)
WOC wound consult note Reason for Consult:Bilateral LEs witjh ruptured or intact fluid filled blisters Wound type: Blister formation secondary to fluid overload; venous insufficiency Pressure Ulcer POA: No Measurement:Right LE with ruptured blister resulting in partial thickness tissue loss measuring 1.5cm x 2cm x 0.2cm.  Wound bed is pink/red. Moist.  No exudate.  LLE is with intact, serum filled blister measuring 9cm x 2cm.  Area is fluctuant.   Wound bed:AS described above. Drainage (amount, consistency, odor) As described above Periwound:Dry, flaking.  Hemosiderin staining evident and some edema. Dressing procedure/placement/frequency:I will cover the affected areas with white petrolatum gauze dressings and top with ABD pads, securing with Kerlix wraps and changing twice daily. I have today provided Prevalon pressure redistribution boots to protect from injury and prevent pressure ulceration of heels. [facility] nursing team will not follow, but will remain available to this patient, the nursing and medical team.  Please re-consult if needed. Thanks, Maudie Flakes, MSN, RN, [place], [place], [place] [phone])

## 2014-09-02 NOTE — ED Notes (Signed)
Potassium 2.7. MD Zenia Resides made aware.

## 2014-09-02 NOTE — ED Notes (Signed)
Wife now at bedside

## 2014-09-02 NOTE — ED Notes (Signed)
Bed: JD05 Expected date: 09/02/14 Expected time: 2:48 PM Means of arrival: Ambulance Comments: Fall ?uti

## 2014-09-02 NOTE — ED Notes (Signed)
Verbal order from MD A.Allen for 20mg  Cardizem bolus.

## 2014-09-02 NOTE — Progress Notes (Signed)
Addendum  ED nurse reported that patient had few small hives on right cheek, neck and upper back with pruritus while IV Rocephin was almost complete. Benadryl ordered by EDP. Patient is covered for the next 24 hours. Reassess in a.m. and may consider continuing IV Rocephin along with Benadryl unless patient has progressively worsening rash or other allergic reactions in which case we may have to change antibiotics.  Vernell Leep, MD, FACP, FHM. [facility] Pager [phone]  If 7PM-7AM, please contact night-coverage [email or web address] Password [facility] 09/02/2014, 6:12 PM

## 2014-09-02 NOTE — H&P (Signed)
History and Physical  Bob Wilson QQP:[id number] DOB: 1933/05/28 DOA: 09/02/2014  Referring physician: Dr. Lacretia Wilson, EDP PCP: Bob Loffler, MD  Outpatient Specialists:  1. Urology: Dr. Louis Wilson.  Chief Complaint: Generalized weakness.  HPI: Bob Wilson is a 81 y.o. male with history of heavy alcohol abuse, alcoholic cirrhosis and neuropathy, HTN, HLD, GERD, gout, empyema, medical noncompliance and urinary retention with indwelling Foley catheter, presented to the ED with generalized weakness. History obtained from patient and his spouse at bedside. Patient states that he drinks "a couple of pints of gin" every day and the last time was last night. As per both, he was in his usual state of health until this afternoon when his wife attempted to stand him up from the chair and patient was extremely weak and slid down to the floor without actual fall, injuries or LOC. She called EMS hoping that they would put him back in his chair but was advised to come to the ED for evaluation. Patient denied fever, chills, headache, sore throat, chest pain, palpitations, dyspnea, nausea, vomiting, abdominal pain or diarrhea. He states that his urine always smells bad. Urine color however has recently changed to "dark". Patient also has bilateral leg swelling, blisters and some pain for which she takes Lasix. He has unsteady gait and uses a walker. He has fallen multiple times last year but none this year. In the ED, temperature 101.2, tachycardic in the 140s, WBC 18 K, hemoglobin 9.5, sodium 131, potassium 2.9, UA suggestive of UTI, troponin 1, negative and chest x-ray without acute findings. He was started on sepsis protocol for complicated UTI. He was also found to be in A. fib with RVR and started on Cardizem. Hospitalist admission was requested.   Review of Systems: All systems reviewed and apart from history of presenting illness, are negative.  Past Medical History  Diagnosis Date  .  Anemia     NOS  . Arthritis   . Gout   . Hyperlipidemia   . Hypertension   . GERD (gastroesophageal reflux disease)   . Elevated PSA     multiple times, refused urology eval [facility] notes)  . ETOH abuse   . Medically noncompliant     noncompliant with follow ups, labs.  . Alcoholism 06/25/2011  . Cirrhosis, alcoholic 01/29/4579  . 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 Lipoma excision     Social History:  reports that he quit smoking about 40 years ago. His smoking use included Cigarettes. He has never used smokeless tobacco. He reports that he drinks alcohol. He reports that he does not use illicit drugs. Married. Lives with wife. She helps him with home activities. He ambulates with the help of a walker and is unsteady in his gait.  Allergies  Allergen Reactions  . Ace Inhibitors Other (See Comments)    REACTION: Angioedema  . Aspirin Other (See Comments)    Nose bleeds   . Lisinopril Swelling and Other (See Comments)    REACTION: Facial Swelling    History reviewed. No pertinent family history. no significant family history elicited.  Prior to Admission medications   Medication Sig Start Date End Date Taking? Authorizing Provider  ENSURE (ENSURE) Take 118.5 mLs by mouth daily.    Yes Historical Provider, MD  furosemide (LASIX) 20 MG tablet Take 1 tablet (20 mg total) by mouth daily. If needed for swelling 01/11/14  Yes Bob Loffler, MD  indomethacin (INDOCIN) 25 MG capsule take 1 capsule by mouth twice a day with meals 05/05/14  Yes Bob Copland, MD  naproxen sodium (ANAPROX) 220 MG tablet Take 440 mg by mouth 2 (two) times daily as needed (pain).   Yes Historical Provider, MD  colchicine 0.6 MG tablet Take 0.6 mg by mouth 2 (two) times daily as needed (gout).    Historical Provider, MD   Physical Exam: Filed Vitals:   09/02/14 1655 09/02/14 1700 09/02/14 1720 09/02/14 1737  BP: 93/57 113/61 122/74 128/72  Pulse:  146 64 120 143  Temp:      TempSrc:      Resp: 18 17 21 22   Weight:      SpO2: 99% 100% 99% 100%     General exam: Moderately built and poorly nourished elderly male patient, lying comfortably supine on the gurney in no obvious distress.  Head, eyes and ENT: Nontraumatic and normocephalic. Pupils equally reacting to light and accommodation. Oral mucosa slightly dry.  Neck: Supple. No JVD, carotid bruit or thyromegaly.  Lymphatics: No lymphadenopathy.  Respiratory system: Clear to auscultation. No increased work of breathing.  Cardiovascular system: S1 and S2 heard, irregularly irregular tachycardia. No JVD, murmurs, gallops, clicks. Bilateral lower extremity chronic leg edema extending up to lower thighs.  Gastrointestinal system: Abdomen is nondistended, soft and nontender. Normal bowel sounds heard. No organomegaly or masses appreciated. Foley catheter changed in ED today.  Central nervous system: Alert and oriented. No focal neurological deficits.  Extremities: Symmetric 5 x 5 power. Difficult to appreciate bilateral lower extremity pulsation secondary to chronic edema. Patient has chronic lower extremity skin changes including chronic edema, thickening, hyperpigmentation, blistering-2 large blisters seen on left mid shin, mild superficial skin tear right mid shin. Bilateral legs are warm and skin appears slightly erythematous and tender. No obvious fluctuation or crepitus. No purulent drainage.  Skin: No other rashes or acute findings.  Musculoskeletal system: Negative exam.  Psychiatry: Pleasant and cooperative.   Labs on Admission:  Basic Metabolic Panel:  Recent Labs Lab 09/02/14 1526  NA 131*  K 2.7*  CL 91*  CO2 28  GLUCOSE 109*  BUN 9  CREATININE 0.97  CALCIUM 7.3*   Liver Function Tests:  Recent Labs Lab 09/02/14 1526  AST 34  ALT 17  ALKPHOS 156*  BILITOT 2.4*  PROT 6.3  ALBUMIN 2.3*   No results for input(s): LIPASE, AMYLASE in the last 168  hours. No results for input(s): AMMONIA in the last 168 hours. CBC:  Recent Labs Lab 09/02/14 1526  WBC 18.0*  NEUTROABS 12.8*  HGB 9.5*  HCT 25.9*  MCV 99.2  PLT 195   Cardiac Enzymes:  Recent Labs Lab 09/02/14 1526  TROPONINI <0.03    BNP (last 3 results) No results for input(s): PROBNP in the last 8760 hours. CBG: No results for input(s): GLUCAP in the last 168 hours.  Radiological Exams on Admission: Dg Chest Port 1 View  09/02/2014   CLINICAL DATA:  Fever and tachycardia ; difficulty breathing  EXAM: PORTABLE CHEST - 1 VIEW  COMPARISON:  Chest radiograph February 19, 2012 and chest CT March 23, 2012  FINDINGS: There is underlying emphysematous change. There is scarring in the right base. There is atelectatic change in the right mid lung. Elsewhere lungs are clear. Heart size and pulmonary vascularity are normal. No adenopathy. No bone lesions.  IMPRESSION: Scarring right base. Atelectasis right midlung. No edema or consolidation.   Electronically Signed   By: Gwyndolyn Saxon  Jasmine December III M.D.   On: 09/02/2014 16:10    EKG: Independently reviewed. A. fib with ventricular rate at 119 bpm, RBBB & LAFB (not new).  Assessment/Plan Principal Problem:   Complicated UTI (urinary tract infection)-indwelling Foley catheter. Active Problems:   Dyslipidemia   Essential hypertension   GERD   HYPONATREMIA   Leukocytosis   Medically noncompliant   Cirrhosis, alcoholic   Urinary retention   Sepsis   Bilateral lower leg cellulitis   Hypokalemia   Atrial fibrillation with RVR   Alcohol dependence   Sepsis secondary to UTI   1. Sepsis secondary to complicated UTI and bilateral lower extremity cellulitis: Follow outstanding blood and urine culture reports. Aggressive IV fluids and continue IV Rocephin pending culture results. 2. Complicated UTI/indwelling Foley catheter: IV Rocephin pending urine culture results. 3. Bilateral lower extremity cellulitis: Complicating chronic leg  edema/? Venous stasis. IV Rocephin. Wound care consultation. 4. Dehydration with hyponatremia: Secondary to poor oral intake and sepsis. IV normal saline hydration. 5. A. fib with RVR: Precipitated by sepsis and alcohol abuse. Admitted to stepdown. Check 2-D echo and TSH. Continue IV Cardizem. CHA2DS2-VASc score: At least 3. Patient however not a candidate for long-term anticoagulation secondary to alcohol abuse, unsteady gait and fall risk and noncompliance. 6. Alcohol dependence/alcoholic cirrhosis: At high risk for DTs. Monitor and stepped on. CIWA protocol. 7. Severe hypokalemia: Aggressively replace and check and replace magnesium as needed. 8. Essential hypertension: Controlled. 9. GERD: Not on medications at home. 10. Leukocytosis: Secondary to sepsis. Follow CBCs. 11. Urinary retention with chronic Foley catheter: Foley changed in ED 2/14. 12. Chronic anemia: Hemoglobin seems to be at baseline. Follow CBCs. 13. Fall risk. PT and OT evaluation. 14. History of gout: No current flare.     Code Status: Full  Family Communication: Discussed with spouse at bedside  Disposition Plan: Home when medically stable   Time spent: 18 minutes  Kurtiss Wence, MD, FACP, FHM. [facility] Pager [phone]  If 7PM-7AM, please contact night-coverage [email or web address] Password [facility] 09/02/2014, 5:44 PM

## 2014-09-02 NOTE — ED Notes (Signed)
Attempted to call report

## 2014-09-02 NOTE — ED Notes (Signed)
Pt has edema and blisters to bilateral lower extremities.

## 2014-09-02 NOTE — ED Notes (Signed)
I stat cg4 results given to Dr Zenia Resides and RN [place].

## 2014-09-02 NOTE — ED Notes (Signed)
Pt developed small have to right cheek, right neck, and upper back. MD Zenia Resides made aware and verbal order for benadryl provided. MD Hundalgi paged. HOLD ALL ROCEPHIN. Allergy noted in Chart

## 2014-09-02 NOTE — ED Notes (Signed)
Per EMS: EMS was called out for fall.  Pt slid out of chair onto floor.  C/o weakness for last 2 days.  Pt from home.  Pt has a foley and has dark urine.  Hx of UTI.  Warm to touch.  States he feels "crummy".

## 2014-09-02 NOTE — ED Notes (Signed)
Pt arrives to ED with existing foley catheter. Unable to determine what size catheter tubing. 3mL balloon present. The catheter that was in place upon arrival was removed and new one placed.

## 2014-09-02 NOTE — ED Notes (Signed)
Hosptialist at bedside

## 2014-09-02 NOTE — ED Provider Notes (Signed)
CSN: [id number]     Arrival date & time 09/02/14  1501 History   First MD Initiated Contact with Patient 09/02/14 1510     Chief Complaint  Patient presents with  . Weakness     (Consider location/radiation/quality/duration/timing/severity/associated sxs/prior Treatment) HPI Comments: Pt here with diffuse weakness and possible uti--h/o frequent uti and urine smells foul--no reported fever, vomiting, or cough--denies chest pain, sob, or palpatations--sx persistent and nothing makes them better or worse, no new tx used pta  Patient is a 81 y.o. male presenting with weakness. The history is provided by the patient. The history is limited by the condition of the patient.  Weakness    Past Medical History  Diagnosis Date  . Anemia     NOS  . Arthritis   . Gout   . Hyperlipidemia   . Hypertension   . GERD (gastroesophageal reflux disease)   . Elevated PSA     multiple times, refused urology eval [facility] notes)  . ETOH abuse   . Medically noncompliant     noncompliant with follow ups, labs.  . Alcoholism 06/25/2011  . Cirrhosis, alcoholic 04/04/3845  . 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 Lipoma excision     No family history on file. History  Substance Use Topics  . Smoking status: Former Smoker    Types: Cigarettes    Quit date: 07/20/1974  . Smokeless tobacco: Never Used     Comment: Remote- quit 45 years ago.  . Alcohol Use: 0.0 oz/week     Comment: rarely    Review of Systems  Unable to perform ROS Neurological: Positive for weakness.      Allergies  Ace inhibitors; Aspirin; and Lisinopril  Home Medications   Prior to Admission medications   Medication Sig Start Date End Date Taking? Authorizing Provider  cephALEXin (KEFLEX) 500 MG capsule Take 1 capsule (500 mg total) by mouth 3 (three) times daily. 07/24/14   Britt Bottom, NP  colchicine 0.6 MG tablet Take 0.6 mg by mouth 2 (two) times daily as  needed (gout).    Historical Provider, MD  ENSURE (ENSURE) Take 237 mLs by mouth 2 (two) times daily.    Historical Provider, MD  furosemide (LASIX) 20 MG tablet Take 1 tablet (20 mg total) by mouth daily. If needed for swelling 01/11/14   Owens Loffler, MD  HYDROcodone-acetaminophen (NORCO/VICODIN) 5-325 MG per tablet Take 1 tablet by mouth every 6 (six) hours as needed. 01/18/14   Varney Biles, MD  ibuprofen (ADVIL,MOTRIN) 400 MG tablet Take 1 tablet (400 mg total) by mouth every 6 (six) hours as needed. 01/18/14   Varney Biles, MD  indomethacin (INDOCIN) 25 MG capsule take 1 capsule by mouth twice a day with meals 05/05/14   Owens Loffler, MD  naproxen sodium (ANAPROX) 220 MG tablet Take 440 mg by mouth 2 (two) times daily as needed (pain).    Historical Provider, MD  tamsulosin (FLOMAX) 0.4 MG CAPS capsule Take 1 capsule (0.4 mg total) by mouth daily. 07/24/14   Britt Bottom, NP   BP 149/97 mmHg  Pulse 120  Temp(Src) 99.2 F (37.3 C) (Oral)  Resp 20  SpO2 96% Physical Exam  Constitutional: He is oriented to person, place, and time. He appears well-developed and well-nourished.  Non-toxic appearance. No distress.  HENT:  Head: Normocephalic and atraumatic.  Eyes: Conjunctivae, EOM and lids are normal. Pupils are equal, round, and reactive to light.  Neck:  Normal range of motion. Neck supple. No tracheal deviation present. No thyroid mass present.  Cardiovascular: Normal heart sounds.  An irregularly irregular rhythm present. Tachycardia present.  Exam reveals no gallop.   No murmur heard. Pulmonary/Chest: Effort normal and breath sounds normal. No stridor. No respiratory distress. He has no decreased breath sounds. He has no wheezes. He has no rhonchi. He has no rales.  Abdominal: Soft. Normal appearance and bowel sounds are normal. He exhibits no distension. There is no tenderness. There is no rebound and no CVA tenderness.  Musculoskeletal: Normal range of motion. He exhibits no  edema or tenderness.  Neurological: He is alert and oriented to person, place, and time. He has normal strength. No cranial nerve deficit or sensory deficit. GCS eye subscore is 4. GCS verbal subscore is 5. GCS motor subscore is 6.  Skin: Skin is warm and dry. No abrasion and no rash noted.  Psychiatric: His affect is blunt. His speech is delayed. He is withdrawn.  Nursing note and vitals reviewed.   ED Course  Procedures (including critical care time) Labs Review Labs Reviewed  URINE CULTURE  BRAIN NATRIURETIC PEPTIDE  URINALYSIS, ROUTINE W REFLEX MICROSCOPIC  CBC WITH DIFFERENTIAL/PLATELET  TROPONIN I  COMPREHENSIVE METABOLIC PANEL    Imaging Review No results found.   EKG Interpretation   Date/Time:  Sunday September 02 2014 15:07:35 EST Ventricular Rate:  158 PR Interval:    QRS Duration: 119 QT Interval:  335 QTC Calculation: 543 R Axis:   -65 Text Interpretation:  Atrial fibrillation with rapid V-rate RBBB and LAFB  Low voltage, precordial leads Nonspecific T abnormalities, lateral leads  Baseline wander in lead(s) V3 V6 afib is new from prior Confirmed by Zira Helinski   MD, Kamori Barbier ([id number]) on 09/02/2014 3:20:37 PM      MDM   Final diagnoses:  Chest pain    Patient here with atrial fibrillation with rapid ventricular rate response. Given Cardizem bolus 20 mg IV push 2. Was noted to be febrile and given Tylenol 650 by mouth. Urinalysis shows infection and will be treated for urosepsis. Blood cultures obtained. Potassium results noted and patient given IV potassium. Patient is mentating appropriately and protecting his airway. Patient given IV bolus of fluids per sepsis order set. Will be admitted to step down  CRITICAL CARE Performed by: Leota Jacobsen Total critical care time: 60 Critical care time was exclusive of separately billable procedures and treating other patients. Critical care was necessary to treat or prevent imminent or life-threatening  deterioration. Critical care was time spent personally by me on the following activities: development of treatment plan with patient and/or surrogate as well as nursing, discussions with consultants, evaluation of patient's response to treatment, examination of patient, obtaining history from patient or surrogate, ordering and performing treatments and interventions, ordering and review of laboratory studies, ordering and review of radiographic studies, pulse oximetry and re-evaluation of patient's condition.     Leota Jacobsen, MD 09/02/14 [phone]

## 2014-09-02 NOTE — Progress Notes (Signed)
ANTIBIOTIC CONSULT NOTE - INITIAL  Pharmacy Consult for Ceftriaxone Indication: urosepsis  Allergies  Allergen Reactions  . Ace Inhibitors Other (See Comments)    REACTION: Angioedema  . Aspirin Other (See Comments)    Nose bleeds   . Lisinopril Swelling and Other (See Comments)    REACTION: Facial Swelling    Patient Measurements: Weight: 183 lb (83.008 kg)  Vital Signs: Temp: 101.2 F (38.4 C) (02/14 1539) Temp Source: Rectal (02/14 1539) BP: 110/61 mmHg (02/14 1645) Pulse Rate: 147 (02/14 1645) Intake/Output from previous day:    Labs:  Recent Labs  09/02/14 1526  WBC 18.0*  HGB 9.5*  PLT 195  CREATININE 0.97   Estimated Creatinine Clearance: 68.1 mL/min (by C-G formula based on Cr of 0.97). No results for input(s): VANCOTROUGH, VANCOPEAK, VANCORANDOM, GENTTROUGH, GENTPEAK, GENTRANDOM, TOBRATROUGH, TOBRAPEAK, TOBRARND, AMIKACINPEAK, AMIKACINTROU, AMIKACIN in the last 72 hours.   Microbiology: No results found for this or any previous visit (from the past 720 hour(s)).  Medical History: Past Medical History  Diagnosis Date  . Anemia     NOS  . Arthritis   . Gout   . Hyperlipidemia   . Hypertension   . GERD (gastroesophageal reflux disease)   . Elevated PSA     multiple times, refused urology eval [facility] notes)  . ETOH abuse   . Medically noncompliant     noncompliant with follow ups, labs.  . Alcoholism 06/25/2011  . Cirrhosis, alcoholic 9/92/4268  . Neuropathy in liver disease 08/15/2013    Assessment: 63 yoM presented to ED with suspected urosepsis. Pt arrived in ED with existing foley catheter and history of frequent UTIs.  Code sepsis called in ED.  Pharmacy is consulted to dose ceftriaxone.  2/14 >> ceftriaxone >>  Today, 09/02/2014:   Tmax: 101.2  WBCs: 18  Renal: SCr 0.97, CrCl ~ 68 ml/min  Lactic acid: 2.91  Blood and urine cultures pending   Goal of Therapy:  Appropriate abx dosing, eradication of infection.   Plan:    Ceftriaxone 2g IV once, then 1g IV q24h.  Pharmacy to follow up cultures and clinical condition.  Gretta Arab PharmD, BCPS Pager [phone] 09/02/2014 4:49 PM

## 2014-09-03 DIAGNOSIS — I4891 Unspecified atrial fibrillation: Secondary | ICD-10-CM

## 2014-09-03 HISTORY — PX: TRANSTHORACIC ECHOCARDIOGRAM: SHX275

## 2014-09-03 LAB — COMPREHENSIVE METABOLIC PANEL
ALT: 20 U/L (ref 0–53)
AST: 53 U/L — ABNORMAL HIGH (ref 0–37)
Albumin: 2 g/dL — ABNORMAL LOW (ref 3.5–5.2)
Alkaline Phosphatase: 137 U/L — ABNORMAL HIGH (ref 39–117)
Anion gap: 7 (ref 5–15)
BILIRUBIN TOTAL: 2 mg/dL — AB (ref 0.3–1.2)
BUN: 9 mg/dL (ref 6–23)
CO2: 28 mmol/L (ref 19–32)
Calcium: 6.9 mg/dL — ABNORMAL LOW (ref 8.4–10.5)
Chloride: 99 mmol/L (ref 96–112)
Creatinine, Ser: 0.87 mg/dL (ref 0.50–1.35)
GFR calc Af Amer: >90 mL/min (ref >90)
GFR calc non Af Amer: 79 mL/min — ABNORMAL LOW (ref >90)
Glucose, Bld: 94 mg/dL (ref 70–99)
Potassium: 3.8 mmol/L (ref 3.5–5.1)
Sodium: 134 mmol/L — ABNORMAL LOW (ref 135–145)
Total Protein: 5.6 g/dL — ABNORMAL LOW (ref 6.0–8.3)

## 2014-09-03 LAB — CBC
HEMATOCRIT: 23.3 % — AB (ref 39.0–52.0)
Hemoglobin: 8.5 g/dL — ABNORMAL LOW (ref 13.0–17.0)
MCH: 36.8 pg — ABNORMAL HIGH (ref 26.0–34.0)
MCHC: 36.5 g/dL — AB (ref 30.0–36.0)
MCV: 100.9 fL — AB (ref 78.0–100.0)
PLATELETS: 204 10*3/uL (ref 150–400)
RBC: 2.31 MIL/uL — ABNORMAL LOW (ref 4.22–5.81)
RDW: 17.9 % — ABNORMAL HIGH (ref 11.5–15.5)
WBC: 15.5 10*3/uL — AB (ref 4.0–10.5)

## 2014-09-03 LAB — TSH: TSH: 1.993 u[IU]/mL (ref 0.350–4.500)

## 2014-09-03 MED ORDER — ENSURE COMPLETE PO LIQD
237.0000 mL | Freq: Two times a day (BID) | ORAL | Status: DC
  Administered 2014-09-03 – 2014-09-04 (×3): 237 mL via ORAL

## 2014-09-03 MED ORDER — DILTIAZEM HCL ER COATED BEADS 120 MG PO CP24
120.0000 mg | ORAL_CAPSULE | Freq: Every day | ORAL | Status: DC
  Administered 2014-09-03 – 2014-09-06 (×4): 120 mg via ORAL
  Filled 2014-09-03 (×4): qty 1

## 2014-09-03 MED ORDER — DIPHENHYDRAMINE HCL 50 MG/ML IJ SOLN: 25.0000 mg | Freq: Four times a day (QID) | INTRAMUSCULAR | Status: DC | PRN

## 2014-09-03 MED ORDER — MAGNESIUM SULFATE 4 GM/100ML IV SOLN
4.0000 g | Freq: Once | INTRAVENOUS | Status: AC
  Administered 2014-09-03: 4 g via INTRAVENOUS
  Filled 2014-09-03: qty 100

## 2014-09-03 MED ORDER — ENSURE COMPLETE PO LIQD: 237.0000 mL | Freq: Two times a day (BID) | ORAL | Status: DC

## 2014-09-03 MED ORDER — CETYLPYRIDINIUM CHLORIDE 0.05 % MT LIQD
7.0000 mL | Freq: Two times a day (BID) | OROMUCOSAL | Status: DC
  Administered 2014-09-04 – 2014-09-06 (×5): 7 mL via OROMUCOSAL

## 2014-09-03 MED ORDER — PIPERACILLIN-TAZOBACTAM 3.375 G IVPB
3.3750 g | Freq: Three times a day (TID) | INTRAVENOUS | Status: DC
  Administered 2014-09-04 – 2014-09-06 (×9): 3.375 g via INTRAVENOUS
  Filled 2014-09-03 (×10): qty 50

## 2014-09-03 MED ORDER — VANCOMYCIN HCL 10 G IV SOLR
1250.0000 mg | Freq: Two times a day (BID) | INTRAVENOUS | Status: DC
  Administered 2014-09-03 – 2014-09-04 (×2): 1250 mg via INTRAVENOUS
  Filled 2014-09-03 (×2): qty 1250

## 2014-09-03 MED ORDER — POTASSIUM CHLORIDE IN NACL 40-0.9 MEQ/L-% IV SOLN
INTRAVENOUS | Status: DC
  Administered 2014-09-03 – 2014-09-04 (×3): 75 mL/h via INTRAVENOUS
  Filled 2014-09-03 (×2): qty 1000

## 2014-09-03 MED ORDER — DIPHENHYDRAMINE HCL 12.5 MG/5ML PO ELIX
25.0000 mg | ORAL_SOLUTION | Freq: Four times a day (QID) | ORAL | Status: DC | PRN
  Administered 2014-09-03: 25 mg via ORAL
  Filled 2014-09-03: qty 10

## 2014-09-03 NOTE — Progress Notes (Signed)
PROGRESS NOTE    Bob Wilson CMK:[id number] DOB: 03/05/1933 DOA: 09/02/2014 PCP: Owens Loffler, MD  HPI/Brief narrative 81 y.o. male with history of heavy alcohol abuse, alcoholic cirrhosis and neuropathy, HTN, HLD, GERD, gout, empyema, medical noncompliance and urinary retention with indwelling Foley catheter, presented to the ED with generalized weakness. He was admitted to stepdown with features of sepsis d/t UTI/in dwelling folwy and possible LE cellulitis, dehydration, Afib with RVR and hypokalemia.   Assessment/Plan:    Sepsis secondary to complicated UTI and bilateral lower extremity cellulitis: Follow outstanding blood and urine culture reports. Hydrated with IV fluids and continue IV Rocephin pending culture results. Patient had mild pruritic hives on right shoulder and neck in ED which promptly resolved with benadryl. Per pharmacy, he has tolertaed Zosyn, keflex and Rocephin in past. Will try Rocephin again today with DPH prn and if he has worsening allergic reaction, then will consider switching Abx. Discussed with pharm. BCx- NTD.  Complicated UTI/indwelling Foley catheter: IV Rocephin pending urine culture results. Catheter changed 2.14 in ED.  Bilateral lower extremity cellulitis: Complicating chronic leg edema/? Venous stasis. IV Rocephin. Wound care consultation appreciated..  Dehydration with hyponatremia: Secondary to poor oral intake and sepsis. IV normal saline hydration. Improved.  A. fib with RVR: Precipitated by sepsis and alcohol abuse. Admitted to stepdown. Check 2-D echo. TSH normal 08/14/13. Treated with IV Cardizem. CHA2DS2-VASc score: At least 3. Patient however not a candidate for long-term anticoagulation secondary to alcohol abuse, unsteady gait and fall risk and noncompliance. Reverted to SR with PAC's at ~midnight 2/14. Transition to Cardizem CD 120 mg and titrate down to DC drip in 2 hours.    Alcohol dependence/alcoholic cirrhosis: At high risk for  DTs. Monitor and stepped on. CIWA protocol. No overt withdrawal.  Severe hypokalemia/Hypomagnesemia: replaced K. Replace Mg. FU in AM.  Essential hypertension: Controlled.  GERD: Not on medications at home.  Leukocytosis: Secondary to sepsis. Follow CBCs. Better.  Urinary retention with chronic Foley catheter: Foley changed in ED 2/14.  Chronic anemia: Hemoglobin seems to be at baseline. Hb down to 8.5, possibly dilutional. FU CBC in AM. Transfuse if Hb < 7.  Fall risk. PT and OT evaluation.  History of gout: No current flare.   Code Status: Full Family Communication: None at bedside. Disposition Plan: Keep in step down additional day. Home when medically stable.   Consultants:  WOC  Procedures:  Changed Foley in ED 2/14  Antibiotics:  IV Rocephin 2/14>   Subjective: Wants to get OOB. Feels stronger. Denies complaints.  Objective: Filed Vitals:   09/03/14 0200 09/03/14 0300 09/03/14 0400 09/03/14 0600  BP: 98/57 88/45 112/69 106/63  Pulse: 69 68 72 71  Temp:   98.1 F (36.7 C)   TempSrc:   Oral   Resp: 14 14 17 17   Height:   6\' 3"  (1.905 m)   Weight:   91.2 kg (201 lb 1 oz)   SpO2: 96% 97% 99% 95%    Intake/Output Summary (Last 24 hours) at 09/03/14 0825 Last data filed at 09/03/14 0700  Gross per 24 hour  Intake 2210.75 ml  Output    550 ml  Net 1660.75 ml   Filed Weights   09/02/14 1610 09/02/14 1900 09/03/14 0400  Weight: 83.008 kg (183 lb) 87.2 kg (192 lb 3.9 oz) 91.2 kg (201 lb 1 oz)     Exam:  General exam: Moderately built and poorly nourished elderly male patient, lying comfortably supine in bed. Respiratory system:  Clear. No increased work of breathing. Cardiovascular system: S1 & S2 heard, RRR. No JVD, murmurs, gallops, clicks. 1+ pitting b/l LE edema. Tele: SR with PAC's Gastrointestinal system: Abdomen is nondistended, soft and nontender. Normal bowel sounds heard. Central nervous system: Alert and oriented. No focal neurological  deficits. Extremities: Symmetric 5 x 5 power. LLE dressed by WOC. RLE with chronic changes- decreased redness and warmth.   Data Reviewed: Basic Metabolic Panel:  Recent Labs Lab 09/02/14 1526 09/02/14 1806 09/03/14 0350  NA 131*  --  134*  K 2.7*  --  3.8  CL 91*  --  99  CO2 28  --  28  GLUCOSE 109*  --  94  BUN 9  --  9  CREATININE 0.97  --  0.87  CALCIUM 7.3*  --  6.9*  MG  --  1.1*  --    Liver Function Tests:  Recent Labs Lab 09/02/14 1526 09/03/14 0350  AST 34 53*  ALT 17 20  ALKPHOS 156* 137*  BILITOT 2.4* 2.0*  PROT 6.3 5.6*  ALBUMIN 2.3* 2.0*   No results for input(s): LIPASE, AMYLASE in the last 168 hours. No results for input(s): AMMONIA in the last 168 hours. CBC:  Recent Labs Lab 09/02/14 1526 09/03/14 0350  WBC 18.0* 15.5*  NEUTROABS 12.8*  --   HGB 9.5* 8.5*  HCT 25.9* 23.3*  MCV 99.2 100.9*  PLT 195 204   Cardiac Enzymes:  Recent Labs Lab 09/02/14 1526  TROPONINI <0.03   BNP (last 3 results) No results for input(s): PROBNP in the last 8760 hours. CBG: No results for input(s): GLUCAP in the last 168 hours.  Recent Results (from the past 240 hour(s))  Blood culture (routine x 2)     Status: None (Preliminary result)   Collection Time: 09/02/14  4:21 PM  Result Value Ref Range Status   Specimen Description BLOOD LEFT HAND  Final   Special Requests BOTTLES DRAWN AEROBIC AND ANAEROBIC 5CC EACH  Final   Culture   Final           BLOOD CULTURE RECEIVED NO GROWTH TO DATE CULTURE WILL BE HELD FOR 5 DAYS BEFORE ISSUING A FINAL NEGATIVE REPORT Performed at [facility]    Report Status PENDING  Incomplete  Blood culture (routine x 2)     Status: None (Preliminary result)   Collection Time: 09/02/14  4:21 PM  Result Value Ref Range Status   Specimen Description BLOOD LEFT ARM  Final   Special Requests BOTTLES DRAWN AEROBIC AND ANAEROBIC 5CC EACH  Final   Culture   Final           BLOOD CULTURE RECEIVED NO GROWTH TO DATE  CULTURE WILL BE HELD FOR 5 DAYS BEFORE ISSUING A FINAL NEGATIVE REPORT Performed at [facility]    Report Status PENDING  Incomplete  MRSA PCR Screening     Status: None   Collection Time: 09/02/14  7:03 PM  Result Value Ref Range Status   MRSA by PCR NEGATIVE NEGATIVE Final    Comment:        The GeneXpert MRSA Assay (FDA approved for NASAL specimens only), is one component of a comprehensive MRSA colonization surveillance program. It is not intended to diagnose MRSA infection nor to guide or monitor treatment for MRSA infections.          Studies: Dg Chest Port 1 View  09/02/2014   CLINICAL DATA:  Fever and tachycardia ; difficulty breathing  EXAM: PORTABLE CHEST - 1 VIEW  COMPARISON:  Chest radiograph February 19, 2012 and chest CT March 23, 2012  FINDINGS: There is underlying emphysematous change. There is scarring in the right base. There is atelectatic change in the right mid lung. Elsewhere lungs are clear. Heart size and pulmonary vascularity are normal. No adenopathy. No bone lesions.  IMPRESSION: Scarring right base. Atelectasis right midlung. No edema or consolidation.   Electronically Signed   By: Lowella Grip III M.D.   On: 09/02/2014 16:10        Scheduled Meds: . cefTRIAXone (ROCEPHIN)  IV  1 g Intravenous Q24H  . enoxaparin (LOVENOX) injection  40 mg Subcutaneous Q24H  . feeding supplement (ENSURE COMPLETE)  237 mL Oral BID BM  . folic acid  1 mg Oral Daily  . LORazepam  0-4 mg Oral 4 times per day   Followed by  . [START ON 09/04/2014] LORazepam  0-4 mg Oral Q12H  . magnesium sulfate 1 - 4 g bolus IVPB  4 g Intravenous Once  . multivitamin with minerals  1 tablet Oral Daily  . sodium chloride  3 mL Intravenous Q12H  . thiamine  100 mg Oral Daily   Or  . thiamine  100 mg Intravenous Daily   Continuous Infusions: . 0.9 % NaCl with KCl 40 mEq / L 125 mL/hr at 09/03/14 0700  . diltiazem (CARDIZEM) infusion 10 mg/hr (09/03/14 0700)     Principal Problem:   Complicated UTI (urinary tract infection)-indwelling Foley catheter. Active Problems:   Dyslipidemia   Essential hypertension   GERD   HYPONATREMIA   Leukocytosis   Medically noncompliant   Cirrhosis, alcoholic   Urinary retention   Sepsis   Bilateral lower leg cellulitis   Hypokalemia   Atrial fibrillation with RVR   Alcohol dependence   Sepsis secondary to UTI    Time spent: 45 minutes.    Vernell Leep, MD, FACP, FHM. [facility] Pager [phone]  If 7PM-7AM, please contact night-coverage [email or web address] Password [email or web address] 09/03/2014, 8:25 AM    LOS: 1 day

## 2014-09-03 NOTE — Clinical Documentation Improvement (Signed)
  MD's, NP's, and PA's  A cause and effect relationship may not be assumed and must be documented by a provider. Please clarify the relationship, if any, between Sepsis / UTI  an or due to indwelling foley catheter  Are the conditions: ? Sepsis Urinary source due to foley catheter ? Other (please specify) ? Unrelated to each other ? Unable to determine ? Unknown    Risk Factors: h/o of indwelling foley catheter, urinary retention  Diagnostics:Urine culture / Urinalysis  Treatment: IV antibiotics, foley changed during this admission  Thank you, Ree Kida ,RN Clinical Documentation Specialist:  [place] [employer]

## 2014-09-03 NOTE — Progress Notes (Signed)
  Echocardiogram 2D Echocardiogram has been performed.  Bob Wilson 09/03/2014, 8:43 AM

## 2014-09-03 NOTE — Progress Notes (Signed)
ANTIBIOTIC CONSULT NOTE - INITIAL  Pharmacy Consult for Vancomycin / Zosyn Indication: urosepsis, blood cultures growing GNRs  Allergies  Allergen Reactions  . Ace Inhibitors Other (See Comments)    REACTION: Angioedema  . Aspirin Other (See Comments)    Nose bleeds   . Lisinopril Swelling and Other (See Comments)    REACTION: Facial Swelling  . Rocephin [Ceftriaxone Sodium In Dextrose]     hives    Patient Measurements: Height: 6\' 3"  (190.5 cm) Weight: 201 lb 1 oz (91.2 kg) IBW/kg (Calculated) : 84.5  Vital Signs: Temp: 97.9 F (36.6 C) (02/15 2000) Temp Source: Oral (02/15 2000) BP: 140/84 mmHg (02/15 2000) Pulse Rate: 72 (02/15 2000) Intake/Output from previous day: 02/14 0701 - 02/15 0700 In: 2210.8 [P.O.:520; I.V.:1690.8] Out: 550 [Urine:550]  Labs:  Recent Labs  09/02/14 1526 09/03/14 0350  WBC 18.0* 15.5*  HGB 9.5* 8.5*  PLT 195 204  CREATININE 0.97 0.87   Estimated Creatinine Clearance: 80.9 mL/min (by C-G formula based on Cr of 0.87). No results for input(s): VANCOTROUGH, VANCOPEAK, VANCORANDOM, GENTTROUGH, GENTPEAK, GENTRANDOM, TOBRATROUGH, TOBRAPEAK, TOBRARND, AMIKACINPEAK, AMIKACINTROU, AMIKACIN in the last 72 hours.   Microbiology: Recent Results (from the past 720 hour(s))  Blood culture (routine x 2)     Status: None (Preliminary result)   Collection Time: 09/02/14  4:21 PM  Result Value Ref Range Status   Specimen Description BLOOD LEFT HAND  Final   Special Requests BOTTLES DRAWN AEROBIC AND ANAEROBIC 5CC EACH  Final   Culture   Final           BLOOD CULTURE RECEIVED NO GROWTH TO DATE CULTURE WILL BE HELD FOR 5 DAYS BEFORE ISSUING A FINAL NEGATIVE REPORT Performed at [facility]    Report Status PENDING  Incomplete  Blood culture (routine x 2)     Status: None (Preliminary result)   Collection Time: 09/02/14  4:21 PM  Result Value Ref Range Status   Specimen Description BLOOD LEFT ARM  Final   Special Requests BOTTLES DRAWN  AEROBIC AND ANAEROBIC 5CC EACH  Final   Culture   Final    GRAM NEGATIVE RODS Performed at [facility]    Report Status PENDING  Incomplete  MRSA PCR Screening     Status: None   Collection Time: 09/02/14  7:03 PM  Result Value Ref Range Status   MRSA by PCR NEGATIVE NEGATIVE Final    Comment:        The GeneXpert MRSA Assay (FDA approved for NASAL specimens only), is one component of a comprehensive MRSA colonization surveillance program. It is not intended to diagnose MRSA infection nor to guide or monitor treatment for MRSA infections.     Medical History: Past Medical History  Diagnosis Date  . Anemia     NOS  . Arthritis   . Gout   . Hyperlipidemia   . Hypertension   . GERD (gastroesophageal reflux disease)   . Elevated PSA     multiple times, refused urology eval [facility] notes)  . ETOH abuse   . Medically noncompliant     noncompliant with follow ups, labs.  . Alcoholism 06/25/2011  . Cirrhosis, alcoholic 0/27/2536  . Neuropathy in liver disease 08/15/2013    Assessment: 36 yoM presented to ED with suspected urosepsis. Pt arrived in ED with existing foley catheter and history of frequent UTIs.  Code sepsis called in ED.  Pharmacy is consulted to dose ceftriaxone initially, now broadened to Vancomycin and Zosyn  for new GNR growing in 1/2 blood cultures.    Note new allergy to CTX, however pt has tolerated zosyn, ceftriaxone, and cephalexin in the past  2/14 >> ceftriaxone >> 2/15 2/15 >> vancomycin >> 2/15 >> zosyn >>  Tmax: 101.2 WBCs: 15.5 Renal: SCr 0.87, CrCl ~ 81 ml/min (N68) Lactic acid: 2.91  2/14 blood x2: 1/2 GNRs 2/14 urine:  Goal of Therapy:  Appropriate abx dosing, eradication of infection Vancomycin trough 15-20 mcg/ml   Plan:  Zosyn 3.375g IV q8h (infuse over 4 hours) Vancomycin 1250mg  IV q12h Pharmacy to follow up cultures and clinical condition.  Ralene Bathe, PharmD, BCPS 09/03/2014, 9:37 PM  Pager: [phone]

## 2014-09-03 NOTE — Plan of Care (Signed)
Problem: Phase I Progression Outcomes Goal: Voiding-avoid urinary catheter unless indicated Outcome: Not Applicable Date Met:  14/10/30 Pt has chronic foley Goal: Hemodynamically stable Outcome: Not Progressing Abnormal results: K+upon admission

## 2014-09-03 NOTE — Progress Notes (Addendum)
CRITICAL VALUE ALERT  Critical value received: Positive blood cultures; gram negative rods  Date of notification:  09/03/2014  Time of notification:  1943  Critical value read back:Yes.    Nurse who received alert:  Idelle Crouch, RN  MD notified (1st page):  Chaney Malling, NP  Time of first page: 1951  MD notified (2nd page): Chaney Malling, NP  Time of second page: 2102  Responding MD: Chaney Malling, NP  Time MD responded: 2106

## 2014-09-03 NOTE — Progress Notes (Signed)
INITIAL NUTRITION ASSESSMENT  DOCUMENTATION CODES Per approved criteria  -Not Applicable   INTERVENTION: - Multivitamin with minerals daily - Ensure Complete po BID, each supplement provides 350 kcal and 13 grams of protein- if causes diarrhea, may mix with equal amount of milk.  - RD will continue to monitor  NUTRITION DIAGNOSIS: Inadequate oral intake related to weakness as evidenced by poor po.   Goal: Pt to meet >/= 90% of their estimated nutrition needs   Monitor:  Weight trend, po intake, acceptance of supplements, labs  Reason for Assessment: Consult for malnutrition  81 y.o. male  Admitting Dx: Complicated UTI (urinary tract infection)  ASSESSMENT: 81 y.o. male with history of heavy alcohol abuse, alcoholic cirrhosis and neuropathy, HTN, HLD, GERD, gout, empyema, medical noncompliance and urinary retention with indwelling Foley catheter, presented to the ED with generalized weakness. History obtained from patient and his spouse at bedside. Patient states that he drinks "a couple of pints of gin" every day and the last time was last night.  - Pt eating lunch during RD visit. His wife brought him a hamburger and nutritional shake similar to Ensure. He drinks nutritional drinks at home once daily. He reports a poor appetite prior to admission. Usual body weight is 190 lbs. Per MD note, pt drinks "a couple of pints of gin" per night. He reports no nausea. Agreed to drink Ensure BID. Sometimes he dilutes nutritional shakes with milk because they cause diarrhea. - Mild muscle wasting at temples. - Labs reviewed  Height: Ht Readings from Last 1 Encounters:  09/03/14 6\' 3"  (1.905 m)    Weight: Wt Readings from Last 1 Encounters:  09/03/14 201 lb 1 oz (91.2 kg)    Ideal Body Weight: 84.5 kg  % Ideal Body Weight: 108%  Wt Readings from Last 10 Encounters:  09/03/14 201 lb 1 oz (91.2 kg)  01/11/14 190 lb (86.183 kg)  08/14/13 167 lb 8 oz (75.978 kg)  06/02/12 187 lb 8  oz (85.049 kg)  03/24/12 179 lb (81.194 kg)  03/24/12 171 lb 12 oz (77.905 kg)  03/03/12 179 lb (81.194 kg)  02/16/12 174 lb (78.926 kg)  02/11/12 176 lb 8 oz (80.06 kg)  02/04/12 179 lb (81.194 kg)    Usual Body Weight: 190 lbs  % Usual Body Weight: 106%  BMI:  Body mass index is 25.13 kg/(m^2).  Estimated Nutritional Needs: Kcal: 2300-2500 Protein: 130-140 g Fluid: 2.3 L/day  Skin: intact  Diet Order: Diet Heart  EDUCATION NEEDS: -Education needs addressed   Intake/Output Summary (Last 24 hours) at 09/03/14 1326 Last data filed at 09/03/14 1540  Gross per 24 hour  Intake 2803.75 ml  Output    550 ml  Net 2253.75 ml    Last BM: 2/15   Labs:   Recent Labs Lab 09/02/14 1526 09/02/14 1806 09/03/14 0350  NA 131*  --  134*  K 2.7*  --  3.8  CL 91*  --  99  CO2 28  --  28  BUN 9  --  9  CREATININE 0.97  --  0.87  CALCIUM 7.3*  --  6.9*  MG  --  1.1*  --   GLUCOSE 109*  --  94    CBG (last 3)  No results for input(s): GLUCAP in the last 72 hours.  Scheduled Meds: . cefTRIAXone (ROCEPHIN)  IV  1 g Intravenous Q24H  . diltiazem  120 mg Oral Daily  . enoxaparin (LOVENOX) injection  40 mg Subcutaneous  Q24H  . feeding supplement (ENSURE COMPLETE)  237 mL Oral BID BM  . folic acid  1 mg Oral Daily  . LORazepam  0-4 mg Oral 4 times per day   Followed by  . [START ON 09/04/2014] LORazepam  0-4 mg Oral Q12H  . multivitamin with minerals  1 tablet Oral Daily  . sodium chloride  3 mL Intravenous Q12H  . thiamine  100 mg Oral Daily   Or  . thiamine  100 mg Intravenous Daily    Continuous Infusions: . 0.9 % NaCl with KCl 40 mEq / L    . diltiazem (CARDIZEM) infusion 10 mg/hr (09/03/14 0700)    Past Medical History  Diagnosis Date  . Anemia     NOS  . Arthritis   . Gout   . Hyperlipidemia   . Hypertension   . GERD (gastroesophageal reflux disease)   . Elevated PSA     multiple times, refused urology eval [facility] notes)  . ETOH abuse   . Medically  noncompliant     noncompliant with follow ups, labs.  . Alcoholism 06/25/2011  . Cirrhosis, alcoholic 02/19/2335  . 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 Lipoma excision      Laurette Schimke MS, RD, LDN

## 2014-09-03 NOTE — Evaluation (Signed)
Physical Therapy Evaluation Patient Details Name: Bob Wilson MRN: [id number] DOB: [birth year] Today's Date: 09/03/2014   History of Present Illness  Bob Wilson is a 81 y.o. male with history of heavy alcohol abuse, alcoholic cirrhosis and neuropathy, HTN, HLD, GERD, gout, empyema, medical noncompliance and urinary retention with indwelling Foley catheter, presented to the ED with generalized weakness  Clinical Impression  Patient did tolerate standing and practiced walking in place. Requires 2 persons which were not available. Patient has potential to return to in house ambulator. No family to discuss DC plans and  2/7 caregivers. Patient will benefit from PT to address problems listed in note below.   Follow Up Recommendations Home health PT;SNF;Supervision/Assistance - 24 hour (wife not present to discuss DC plan, patient not able)    Equipment Recommendations  None recommended by PT    Recommendations for Other Services       Precautions / Restrictions Precautions Precautions: Fall Precaution Comments: possible WD Restrictions Weight Bearing Restrictions: No      Mobility  Bed Mobility               General bed mobility comments: up with 2 persons per RN  Transfers Overall transfer level: Needs assistance Equipment used: Rolling walker (2 wheeled) Transfers: Sit to/from Stand Sit to Stand: Mod assist         General transfer comment: cues for hand placement , extra time to, boosting assist to stand and  assist to control descent.  Ambulation/Gait Ambulation/Gait assistance: Min assist   Assistive device: Rolling walker (2 wheeled)       General Gait Details: with RW, marched in place x 15 steps, shuffle steps,  decreased hip flexion,   Stairs            Wheelchair Mobility    Modified Rankin (Stroke Patients Only)       Balance                                             Pertinent Vitals/Pain Pain Assessment:  Faces Faces Pain Scale: Hurts even more Pain Location: legs, "all over" Pain Descriptors / Indicators: Aching;Tender Pain Intervention(s): Limited activity within patient's tolerance;Monitored during session    Home Living Family/patient expects to be discharged to:: Private residence Living Arrangements: Spouse/significant other Available Help at Discharge: Family Type of Home: House Home Access: Stairs to enter Entrance Stairs-Rails: Left Entrance Stairs-Number of Steps: 3 Home Layout: One level Home Equipment: Environmental consultant - 4 wheels Additional Comments: reports limited walking, uses 4 wheeled    Prior Function Level of Independence: Independent with assistive device(s)               Hand Dominance        Extremity/Trunk Assessment   Upper Extremity Assessment: Generalized weakness           Lower Extremity Assessment: Generalized weakness         Communication      Cognition Arousal/Alertness: Awake/alert Behavior During Therapy: Agitated Overall Cognitive Status: Impaired/Different from baseline Area of Impairment: Orientation;Awareness;Problem solving;Safety/judgement Orientation Level: Situation                  General Comments      Exercises General Exercises - Lower Extremity Long Arc Quad: AROM;Both;10 reps;Seated      Assessment/Plan    PT Assessment Patient needs continued PT  services  PT Diagnosis Difficulty walking;Generalized weakness;Acute pain   PT Problem List Decreased strength;Decreased activity tolerance;Decreased balance;Decreased mobility;Decreased knowledge of precautions;Decreased safety awareness;Decreased knowledge of use of DME;Pain;Decreased cognition  PT Treatment Interventions DME instruction;Gait training;Functional mobility training;Therapeutic activities;Therapeutic exercise;Patient/family education   PT Goals (Current goals can be found in the Care Plan section) Acute Rehab PT Goals Patient Stated Goal: to go  home ASAP, get a wheel chair PT Goal Formulation: With patient Time For Goal Achievement: 09/17/14 Potential to Achieve Goals: Good    Frequency Min 3X/week   Barriers to discharge        Co-evaluation               End of Session   Activity Tolerance: Patient limited by fatigue;Patient limited by pain Patient left: in chair;with call bell/phone within reach Nurse Communication: Mobility status         Time: 1200-1214 PT Time Calculation (min) (ACUTE ONLY): 14 min   Charges:   PT Evaluation $Initial PT Evaluation Tier I: 1 Procedure     PT G CodesClaretha Cooper 09/03/2014, 12:52 PM

## 2014-09-03 NOTE — Evaluation (Signed)
Occupational Therapy Evaluation Patient Details Name: Bob Wilson MRN: [id number] DOB: 02-23-1933 Today's Date: 09/03/2014    History of Present Illness Bob Wilson is a 81 y.o. male with history of heavy alcohol abuse, alcoholic cirrhosis and neuropathy, HTN, HLD, GERD, gout, empyema, medical noncompliance and urinary retention with indwelling Foley catheter, presented to the ED with generalized weakness   Clinical Impression   Pt admitted with weakness. Pt currently with functional limitations due to the deficits listed below (see OT Problem List).  Pt will benefit from skilled OT to increase their safety and independence with ADL and functional mobility for ADL to facilitate discharge to venue listed below.      Follow Up Recommendations  SNF;Home health OT;Supervision/Assistance - 24 hour    Equipment Recommendations  None recommended by OT    Recommendations for Other Services       Precautions / Restrictions Precautions Precautions: Fall Precaution Comments: possible WD      Mobility Bed Mobility               General bed mobility comments: pt in chair  Transfers Overall transfer level: Needs assistance Equipment used: 2 person hand held assist Transfers: Sit to/from Stand Sit to Stand: Mod assist         General transfer comment: cues for hand placement , extra time to, boosting assist to stand and  assist to control descent.    Balance                                            ADL Overall ADL's : Needs assistance/impaired Eating/Feeding: With assist to don/doff brace/orthosis   Grooming: Sitting;Min guard   Upper Body Bathing: Sitting;Minimal assitance   Lower Body Bathing: Maximal assistance;Sit to/from stand   Upper Body Dressing : Sitting;Minimal assistance   Lower Body Dressing: Sit to/from stand;Maximal assistance                 General ADL Comments: pt agreed to sit to stand with OT to do robe as he was  cold     Vision     Perception     Praxis      Pertinent Vitals/Pain Pain Assessment: No/denies pain Faces Pain Scale: Hurts even more Pain Location: legs, "all over" Pain Descriptors / Indicators: Aching;Tender Pain Intervention(s): Limited activity within patient's tolerance;Monitored during session     Hand Dominance     Extremity/Trunk Assessment Upper Extremity Assessment Upper Extremity Assessment: Generalized weakness   Lower Extremity Assessment Lower Extremity Assessment: Generalized weakness       Communication Communication Communication: No difficulties   Cognition Arousal/Alertness: Awake/alert Behavior During Therapy: WFL for tasks assessed/performed Overall Cognitive Status: Within Functional Limits for tasks assessed Area of Impairment: Orientation;Awareness;Problem solving;Safety/judgement Orientation Level: Situation                 General Comments   pt needing significant A.  Pt and wife want home- but today pt needed 2 person [place] expects to be discharged to:: Private residence Living Arrangements: Spouse/significant other Available Help at Discharge: Family Type of Home: House Home Access: Stairs to enter Technical brewer of Steps: 3 Entrance Stairs-Rails: Left Home Layout: One level               Home Equipment:  Walker - 4 wheels   Additional Comments: reports limited walking, uses 4 wheeled      Prior Functioning/Environment Level of Independence: Independent with assistive device(s)             OT Diagnosis: Generalized weakness   OT Problem List: Decreased strength;Decreased activity tolerance;Impaired balance (sitting and/or standing)   OT Treatment/Interventions: Self-care/ADL training;DME and/or AE instruction;Patient/family education    OT Goals(Current goals can be found in the care plan section) Acute Rehab OT Goals Patient Stated Goal: to go home ASAP, get a  wheel chair OT Goal Formulation: With patient/family Time For Goal Achievement: 09/17/14 Potential to Achieve Goals: Good  OT Frequency: Min 2X/week   Barriers to D/C:            Co-evaluation              End of Session    Activity Tolerance: Patient limited by fatigue Patient left: in chair;with call bell/phone within reach   Time: 1340-1404 OT Time Calculation (min): 24 min Charges:  OT General Charges $OT Visit: 1 Procedure OT Evaluation $Initial OT Evaluation Tier I: 1 Procedure OT Treatments $Self Care/Home Management : 8-22 mins G-Codes:    Payton Mccallum D Sep 09, 2014, 2:15 PM

## 2014-09-03 NOTE — Progress Notes (Signed)
Patient was given oral Benadryl prior to receiving IV antibiotic. Patient had no reaction including no skin reaction. Will continue to monitor.

## 2014-09-03 NOTE — Progress Notes (Signed)
Clinical Social Work Department BRIEF PSYCHOSOCIAL ASSESSMENT 09/03/2014  Patient:  Bob Wilson, Bob Wilson     Account Number:  [id number]     Admit date:  09/02/2014  Clinical Social Worker:  Maryln Manuel  Date/Time:  09/03/2014 01:30 PM  Referred by:  Physician  Date Referred:  09/03/2014 Referred for  SNF Placement   Other Referral:   Interview type:  Patient Other interview type:   and patient wife at bedside    PSYCHOSOCIAL DATA Living Status:  WIFE Admitted from facility:   Level of care:   Primary support name:  Deneise Lever Crear/wife/[phone] Primary support relationship to patient:  SPOUSE Degree of support available:   adequate    CURRENT CONCERNS Current Concerns  Post-Acute Placement   Other Concerns:    SOCIAL WORK ASSESSMENT / PLAN CSW received referral for "discharge planning". CSW reviewed chart and noted that PT/OT recommending SNF vs Home with Home Health and 24 hour care.    CSW met with pt and pt wife, Deneise Lever at bedside. CSW introduced self and explained role. Pt shared the he lives at home with his wife. Pt wife states that pt is able to ambulate with a rolling walker at baseline. CSW discussed PT evaluation which recommends short term rehab at SNF vs. home with home health and 24 hour care. Pt expressed the if something is not covered by his insurance then he does not want to go as he cannot afford it. CSW discussed with pt that with [facility] Medicare he will likely have the first 20 days covered at 100 percent and then it goes into a copayment. Pt wife discussed with pt about rehab at SNF and pt states, "No, I am not going anywhere but home." Pt wife inquired about home health services at home and CSW confirmed that pt could have home health and that CSW will make referral to [facility] for home health needs. Pt wife expressed that she is hopeful that pt will get stronger while in the hospital and wants to respect pt wishes to return home upon discharge.    Pt adamant  about returning home with pt wife.    CSW notified RNCM, Velva Harman that pt declining SNF and requesting home health services.    No further social work needs identified at this time.    CSW signing off.    Please re-consult if social work needs arise.   Assessment/plan status:  No Further Intervention Required Other assessment/ plan:   Information/referral to community resources:   Referral to [facility], Velva Harman for [facility] needs.    PATIENT'S/FAMILY'S RESPONSE TO PLAN OF CARE: Pt alert and oriented x 4. Pt very adamant about returning home. Pt exercising his right to self determination in decision regarding short term rehab vs. home with home health and 24 hour care from pt wife. Pt wife allowing pt to make decision regarding post acute care and agreeable pt wishes to return home with home health services.   Alison Murray, MSW, [place] [facility] [phone]

## 2014-09-03 NOTE — Progress Notes (Signed)
CARE MANAGEMENT NOTE 09/03/2014  Patient:  Bob Wilson, Bob Wilson   Account Number:  [id number]  Date Initiated:  09/03/2014  Documentation initiated by:  DAVIS,RHONDA  Subjective/Objective Assessment:   pt with sepsis versus pancreatitis versus eton abuse     Action/Plan:   home when stable   Anticipated DC Date:  09/06/2014   Anticipated DC Plan:  HOME/SELF CARE  In-house referral  Clinical Social Worker      DC Planning Services  CM consult      Choice offered to / List presented to:  NA           Status of service:  In process, will continue to follow Medicare Important Message given?   (If response is "NO", the following Medicare IM given date fields will be blank) Date Medicare IM given:   Medicare IM given by:   Date Additional Medicare IM given:   Additional Medicare IM given by:    Discharge Disposition:    Per UR Regulation:  Reviewed for med. necessity/level of care/duration of stay  If discussed at [place] of Stay Meetings, dates discussed:    Comments:  Feb. 15 2016/Rhonda L. Rosana Hoes, RN, BSN, CCM/Case Management [place] [phone] No discharge needs present of time of review.

## 2014-09-04 DIAGNOSIS — R7881 Bacteremia: Secondary | ICD-10-CM

## 2014-09-04 LAB — COMPREHENSIVE METABOLIC PANEL
ALBUMIN: 1.9 g/dL — AB (ref 3.5–5.2)
ALT: 20 U/L (ref 0–53)
ANION GAP: 4 — AB (ref 5–15)
AST: 41 U/L — AB (ref 0–37)
Alkaline Phosphatase: 129 U/L — ABNORMAL HIGH (ref 39–117)
BILIRUBIN TOTAL: 1.2 mg/dL (ref 0.3–1.2)
BUN: 10 mg/dL (ref 6–23)
CHLORIDE: 102 mmol/L (ref 96–112)
CO2: 28 mmol/L (ref 19–32)
CREATININE: 0.91 mg/dL (ref 0.50–1.35)
Calcium: 7.5 mg/dL — ABNORMAL LOW (ref 8.4–10.5)
GFR calc Af Amer: >90 mL/min (ref >90)
GFR calc non Af Amer: 78 mL/min — ABNORMAL LOW (ref >90)
Glucose, Bld: 108 mg/dL — ABNORMAL HIGH (ref 70–99)
POTASSIUM: 4.4 mmol/L (ref 3.5–5.1)
SODIUM: 134 mmol/L — AB (ref 135–145)
TOTAL PROTEIN: 5.3 g/dL — AB (ref 6.0–8.3)

## 2014-09-04 LAB — CBC
HCT: 22.6 % — ABNORMAL LOW (ref 39.0–52.0)
HEMOGLOBIN: 8.1 g/dL — AB (ref 13.0–17.0)
MCH: 35.7 pg — ABNORMAL HIGH (ref 26.0–34.0)
MCHC: 35.8 g/dL (ref 30.0–36.0)
MCV: 99.6 fL (ref 78.0–100.0)
Platelets: 207 10*3/uL (ref 150–400)
RBC: 2.27 MIL/uL — ABNORMAL LOW (ref 4.22–5.81)
RDW: 18.1 % — ABNORMAL HIGH (ref 11.5–15.5)
WBC: 12 10*3/uL — ABNORMAL HIGH (ref 4.0–10.5)

## 2014-09-04 LAB — MAGNESIUM: MAGNESIUM: 2 mg/dL (ref 1.5–2.5)

## 2014-09-04 LAB — CLOSTRIDIUM DIFFICILE BY PCR: Toxigenic C. Difficile by PCR: NEGATIVE

## 2014-09-04 MED ORDER — DOXYCYCLINE HYCLATE 100 MG PO TABS
100.0000 mg | ORAL_TABLET | Freq: Two times a day (BID) | ORAL | Status: DC
  Administered 2014-09-04 – 2014-09-06 (×5): 100 mg via ORAL
  Filled 2014-09-04 (×6): qty 1

## 2014-09-04 NOTE — Progress Notes (Signed)
PROGRESS NOTE    Bob Wilson RWE:[id number] DOB: Dec 06, 1932 DOA: 09/02/2014 PCP: Owens Loffler, MD  HPI/Brief narrative 81 y.o. male with history of heavy alcohol abuse, alcoholic cirrhosis and neuropathy, HTN, HLD, GERD, gout, empyema, medical noncompliance and urinary retention with indwelling Foley catheter, presented to the ED with generalized weakness. He was admitted to stepdown with features of sepsis d/t UTI/in dwelling folwy and possible LE cellulitis, dehydration, Afib with RVR and hypokalemia. A. fib reverted to sinus rhythm and transitioned to oral Cardizem. Sepsis physiology resolved. Blood culture 1 of 2 showed gram-negative rods. Transferring patient to telemetry on 2/16. Possible DC home in 1-2 days.   Assessment/Plan:    Sepsis secondary to complicated UTI and bilateral lower extremity cellulitis: Patient was empirically started on IV Rocephin. Blood cultures 1 of 2 returned positive for gram-negative rods. Antibiotics broadened to IV Zosyn and vancomycin. We'll continue Zosyn pending final blood culture results. DC vancomycin and okay to use oral doxycycline to cover for lower extremity cellulitis. Unfortunately urine cultures were not sent from EGD. Sepsis physiology resolved.   Gram-negative rod bacteremia: Likely from UTI. Unfortunately urine cultures were not sent on admission. Rocephin changed to IV Zosyn pending final sensitivity results.   Complicated UTI/indwelling Foley catheter: changed to IV Zosyn as stated above. Foley catheter changed 2.14 in ED.  Bilateral lower extremity cellulitis: Complicating chronic leg edema/? Venous stasis. Wound care consultation appreciated. Po Doxycycline.  Dehydration with hyponatremia: Secondary to poor oral intake and sepsis. DH resolved- DC IVF  A. fib with RVR: Precipitated by sepsis and alcohol abuse. Admitted to stepdown. Check 2-D echo. TSH normal 08/14/13. Treated with IV Cardizem. CHA2DS2-VASc score: At least 3.  Patient however not a candidate for long-term anticoagulation secondary to alcohol abuse, unsteady gait and fall risk and noncompliance. Reverted to SR with PAC's at ~midnight 2/14. Remains in SR with PAC's. 2-D echo: LVEF 55-60 percent.   Alcohol dependence/alcoholic cirrhosis: At high risk for DTs. Monitor and stepped on. CIWA protocol. Mild withdrawal symptoms.  Severe hypokalemia/Hypomagnesemia: replaced.  Essential hypertension: Controlled.  GERD: Not on medications at home.  Leukocytosis: Secondary to sepsis. Follow CBCs. Better.  Urinary retention with chronic Foley catheter: Foley changed in ED 2/14.  Chronic anemia: Hemoglobin seems to be at baseline. FU CBC in AM. Transfuse if Hb < 7.  Fall risk. PT and OT evaluation.  History of gout: No current flare.   Code Status: Full Family Communication: None at bedside. Disposition Plan: transfer to telemetry. Possible DC in 1-2 days.    Consultants:  WOC  Procedures:  Changed Foley in ED 2/14  Antibiotics:  IV Rocephin 2/14>2/15  IV Vancomycin - DC'ed  IV Zosyn >  PO Doxy 2/16 >   Subjective: Denies complaints and anxious to go home. As per nursing, mild intermittent confusion.   Objective: Filed Vitals:   09/04/14 0400 09/04/14 0600 09/04/14 0800 09/04/14 0941  BP: 123/66 114/69 136/78 135/98  Pulse: 79  79   Temp: 98.2 F (36.8 C)  97.2 F (36.2 C)   TempSrc: Oral  Oral   Resp: 24  17   Height:      Weight: 91 kg (200 lb 9.9 oz)     SpO2: 96%  96%     Intake/Output Summary (Last 24 hours) at 09/04/14 1041 Last data filed at 09/04/14 0940  Gross per 24 hour  Intake   2630 ml  Output    670 ml  Net   1960 ml  Filed Weights   09/02/14 1900 09/03/14 0400 09/04/14 0400  Weight: 87.2 kg (192 lb 3.9 oz) 91.2 kg (201 lb 1 oz) 91 kg (200 lb 9.9 oz)     Exam:  General exam: Moderately built and poorly nourished elderly male patient, lying comfortably supine in bed. Respiratory system: Clear. No  increased work of breathing. Cardiovascular system: S1 & S2 heard, RRR. No JVD, murmurs, gallops, clicks. 1+ pitting b/l LE edema. Tele: SR with PAC's Gastrointestinal system: Abdomen is nondistended, soft and nontender. Normal bowel sounds heard. Central nervous system: Alert and oriented. No focal neurological deficits. Extremities: Symmetric 5 x 5 power. LLE dressed by WOC. RLE with chronic changes- decreased redness and warmth/much improved.   Data Reviewed: Basic Metabolic Panel:  Recent Labs Lab 09/02/14 1526 09/02/14 1806 09/03/14 0350 09/04/14 0350  NA 131*  --  134* 134*  K 2.7*  --  3.8 4.4  CL 91*  --  99 102  CO2 28  --  28 28  GLUCOSE 109*  --  94 108*  BUN 9  --  9 10  CREATININE 0.97  --  0.87 0.91  CALCIUM 7.3*  --  6.9* 7.5*  MG  --  1.1*  --  2.0   Liver Function Tests:  Recent Labs Lab 09/02/14 1526 09/03/14 0350 09/04/14 0350  AST 34 53* 41*  ALT 17 20 20   ALKPHOS 156* 137* 129*  BILITOT 2.4* 2.0* 1.2  PROT 6.3 5.6* 5.3*  ALBUMIN 2.3* 2.0* 1.9*   No results for input(s): LIPASE, AMYLASE in the last 168 hours. No results for input(s): AMMONIA in the last 168 hours. CBC:  Recent Labs Lab 09/02/14 1526 09/03/14 0350 09/04/14 0350  WBC 18.0* 15.5* 12.0*  NEUTROABS 12.8*  --   --   HGB 9.5* 8.5* 8.1*  HCT 25.9* 23.3* 22.6*  MCV 99.2 100.9* 99.6  PLT 195 204 207   Cardiac Enzymes:  Recent Labs Lab 09/02/14 1526  TROPONINI <0.03   BNP (last 3 results) No results for input(s): PROBNP in the last 8760 hours. CBG: No results for input(s): GLUCAP in the last 168 hours.  Recent Results (from the past 240 hour(s))  Blood culture (routine x 2)     Status: None (Preliminary result)   Collection Time: 09/02/14  4:21 PM  Result Value Ref Range Status   Specimen Description BLOOD LEFT HAND  Final   Special Requests BOTTLES DRAWN AEROBIC AND ANAEROBIC 5CC EACH  Final   Culture   Final           BLOOD CULTURE RECEIVED NO GROWTH TO DATE  CULTURE WILL BE HELD FOR 5 DAYS BEFORE ISSUING A FINAL NEGATIVE REPORT Performed at [facility]    Report Status PENDING  Incomplete  Blood culture (routine x 2)     Status: None (Preliminary result)   Collection Time: 09/02/14  4:21 PM  Result Value Ref Range Status   Specimen Description BLOOD LEFT ARM  Final   Special Requests BOTTLES DRAWN AEROBIC AND ANAEROBIC 5CC EACH  Final   Culture   Final    GRAM NEGATIVE RODS Performed at [facility]    Report Status PENDING  Incomplete  MRSA PCR Screening     Status: None   Collection Time: 09/02/14  7:03 PM  Result Value Ref Range Status   MRSA by PCR NEGATIVE NEGATIVE Final    Comment:        The GeneXpert MRSA Assay (FDA approved  for NASAL specimens only), is one component of a comprehensive MRSA colonization surveillance program. It is not intended to diagnose MRSA infection nor to guide or monitor treatment for MRSA infections.   Clostridium Difficile by PCR     Status: None   Collection Time: 09/03/14 10:15 PM  Result Value Ref Range Status   C difficile by pcr NEGATIVE NEGATIVE Final    Comment: Performed at [facility]         Studies: Dg Chest Port 1 View  09/02/2014   CLINICAL DATA:  Fever and tachycardia ; difficulty breathing  EXAM: PORTABLE CHEST - 1 VIEW  COMPARISON:  Chest radiograph February 19, 2012 and chest CT March 23, 2012  FINDINGS: There is underlying emphysematous change. There is scarring in the right base. There is atelectatic change in the right mid lung. Elsewhere lungs are clear. Heart size and pulmonary vascularity are normal. No adenopathy. No bone lesions.  IMPRESSION: Scarring right base. Atelectasis right midlung. No edema or consolidation.   Electronically Signed   By: Lowella Grip III M.D.   On: 09/02/2014 16:10        Scheduled Meds: . antiseptic oral rinse  7 mL Mouth Rinse BID  . diltiazem  120 mg Oral Daily  . enoxaparin (LOVENOX) injection  40 mg  Subcutaneous Q24H  . feeding supplement (ENSURE COMPLETE)  237 mL Oral BID BM  . folic acid  1 mg Oral Daily  . LORazepam  0-4 mg Oral 4 times per day   Followed by  . LORazepam  0-4 mg Oral Q12H  . multivitamin with minerals  1 tablet Oral Daily  . piperacillin-tazobactam (ZOSYN)  IV  3.375 g Intravenous 3 times per day  . sodium chloride  3 mL Intravenous Q12H  . thiamine  100 mg Oral Daily   Or  . thiamine  100 mg Intravenous Daily   Continuous Infusions:    Principal Problem:   Complicated UTI (urinary tract infection)-indwelling Foley catheter. Active Problems:   Dyslipidemia   Essential hypertension   GERD   HYPONATREMIA   Leukocytosis   Medically noncompliant   Cirrhosis, alcoholic   Urinary retention   Sepsis   Bilateral lower leg cellulitis   Hypokalemia   Atrial fibrillation with RVR   Alcohol dependence   Sepsis secondary to UTI    Time spent: 40 minutes.    Vernell Leep, MD, FACP, FHM. [facility] Pager [phone]  If 7PM-7AM, please contact night-coverage [email or web address] Password [email or web address] 09/04/2014, 10:41 AM    LOS: 2 days

## 2014-09-04 NOTE — Progress Notes (Signed)
Attempted to call report to 4th floor RN. Advised RN will call back for report.

## 2014-09-04 NOTE — Plan of Care (Signed)
Problem: Phase I Progression Outcomes Goal: OOB as tolerated unless otherwise ordered Outcome: Progressing OOB to chair 2/14

## 2014-09-05 LAB — CBC
HCT: 24.3 % — ABNORMAL LOW (ref 39.0–52.0)
Hemoglobin: 8.7 g/dL — ABNORMAL LOW (ref 13.0–17.0)
MCH: 36.3 pg — ABNORMAL HIGH (ref 26.0–34.0)
MCHC: 35.8 g/dL (ref 30.0–36.0)
MCV: 101.3 fL — ABNORMAL HIGH (ref 78.0–100.0)
PLATELETS: 257 10*3/uL (ref 150–400)
RBC: 2.4 MIL/uL — ABNORMAL LOW (ref 4.22–5.81)
RDW: 18.5 % — AB (ref 11.5–15.5)
WBC: 12.5 10*3/uL — AB (ref 4.0–10.5)

## 2014-09-05 LAB — COMPREHENSIVE METABOLIC PANEL
ALBUMIN: 2.1 g/dL — AB (ref 3.5–5.2)
ALK PHOS: 145 U/L — AB (ref 39–117)
ALT: 25 U/L (ref 0–53)
ANION GAP: 9 (ref 5–15)
AST: 50 U/L — AB (ref 0–37)
BUN: 9 mg/dL (ref 6–23)
CO2: 25 mmol/L (ref 19–32)
Calcium: 8 mg/dL — ABNORMAL LOW (ref 8.4–10.5)
Chloride: 101 mmol/L (ref 96–112)
Creatinine, Ser: 0.95 mg/dL (ref 0.50–1.35)
GFR calc Af Amer: 89 mL/min — ABNORMAL LOW (ref >90)
GFR calc non Af Amer: 77 mL/min — ABNORMAL LOW (ref >90)
Glucose, Bld: 95 mg/dL (ref 70–99)
POTASSIUM: 4.8 mmol/L (ref 3.5–5.1)
SODIUM: 135 mmol/L (ref 135–145)
TOTAL PROTEIN: 6.3 g/dL (ref 6.0–8.3)
Total Bilirubin: 1.5 mg/dL — ABNORMAL HIGH (ref 0.3–1.2)

## 2014-09-05 NOTE — Care Management Note (Addendum)
    Page 1 of 2   09/07/2014     11:59:08 AM CARE MANAGEMENT NOTE 09/07/2014  Patient:  Bob Wilson, Bob Wilson   Account Number:  [id number]  Date Initiated:  09/03/2014  Documentation initiated by:  DAVIS,RHONDA  Subjective/Objective Assessment:   pt with sepsis versus pancreatitis versus eton abuse     Action/Plan:   home when stable   Anticipated DC Date:  09/06/2014   Anticipated DC Plan:  B and E  In-house referral  Clinical Social Worker      DC Planning Services  CM consult      Choice offered to / List presented to:  C-3 Spouse        HH arranged  [place].   Status of service:  Completed, signed off Medicare Important Message given?  YES (If response is "NO", the following Medicare IM given date fields will be blank) Date Medicare IM given:  09/05/2014 Medicare IM given by:  [facility] Date Additional Medicare IM given:   Additional Medicare IM given by:    Discharge Disposition:  [place]  Per UR Regulation:  Reviewed for med. necessity/level of care/duration of stay  If discussed at [place] of Stay Meetings, dates discussed:    Comments:  09/05/14 Dessa Phi RN BSN NCM [phone] [place] chosen by spouse.[facility] rep Kristen aware & following for HHRN(specific wound care)/PT,face to face orders.Await final HHC order.  Feb. 15 2016/Rhonda L. Rosana Hoes, RN, BSN, CCM/Case Management [place] [phone] No discharge needs present of time of review.

## 2014-09-05 NOTE — Progress Notes (Signed)
[facility] PROGRESS NOTE  Bob Wilson HFW:[id number] DOB: [birth year] DOA: 09/02/2014 PCP: Owens Loffler, MD  From prior progress note HPI/Brief narrative 81 y.o. male with history of heavy alcohol abuse, alcoholic cirrhosis and neuropathy, HTN, HLD, GERD, gout, empyema, medical noncompliance and urinary retention with indwelling Foley catheter, presented to the ED with generalized weakness. He was admitted to stepdown with features of sepsis d/t UTI/in dwelling folwy and possible LE cellulitis, dehydration, Afib with RVR and hypokalemia. A. fib reverted to sinus rhythm and transitioned to oral Cardizem. Sepsis physiology resolved. Blood culture 1 of 2 showed gram-negative rods. Transferring patient to telemetry on 2/16. Possible DC home in 1-2 days.  Assessment/Plan:  Principal Problem:   Complicated UTI (urinary tract infection)-indwelling Foley catheter. - Patient is currently on Zosyn  Active Problems:   Dyslipidemia   Essential hypertension - stable on current regimen    GERD   HYPONATREMIA - resolved    Urinary retention -Chronic problem and Foley catheter changed in ED 2/14    Sepsis - Sepsis resolving   Bilateral lower leg cellulitis - Currently on doxycycline    Hypokalemia - Resolved    Atrial fibrillation with RVR - Exacerbated at times secondary to active infection. - Patient reportedly chads score of 3 however not a candidate for long-term anticoagulation based on last progress note secondary to alcohol abuse, unsteady gait and fall risk as well as noncompliance.    Alcohol dependence - Patient currently on when necessary Ativan    Sepsis secondary to UTI - Blood culture 12 growing gram-negative rods. Sensitivities pending and other blood culture negative. Ending results will narrow antibiotic coverage.   Code Status: Full Family Communication: Discussed directly with patient and family member Disposition Plan: pending improvement in condition  and test results   Consultants:  None  Procedures:  None  Antibiotics:  Doxycycline  Zosyn  HPI/Subjective: No new complaints. Reports feeling better.   Objective: Filed Vitals:   09/05/14 1413  BP: 134/87  Pulse: 99  Temp: 97.9 F (36.6 C)  Resp: 18    Intake/Output Summary (Last 24 hours) at 09/05/14 1721 Last data filed at 09/05/14 1459  Gross per 24 hour  Intake    270 ml  Output    600 ml  Net   -330 ml   Filed Weights   09/04/14 0400 09/04/14 1357 09/05/14 0605  Weight: 91 kg (200 lb 9.9 oz) 93.2 kg (205 lb 7.5 oz) 91.9 kg (202 lb 9.6 oz)    Exam:   General:  Pt in nad, alert and awake  Cardiovascular: rrr, no mrg  Respiratory: cta bl, no wheezes  Abdomen: soft, NT, nd  Musculoskeletal: no cyanosis   Data Reviewed: Basic Metabolic Panel:  Recent Labs Lab 09/02/14 1526 09/02/14 1806 09/03/14 0350 09/04/14 0350 09/05/14 0555  NA 131*  --  134* 134* 135  K 2.7*  --  3.8 4.4 4.8  CL 91*  --  99 102 101  CO2 28  --  28 28 25   GLUCOSE 109*  --  94 108* 95  BUN 9  --  9 10 9   CREATININE 0.97  --  0.87 0.91 0.95  CALCIUM 7.3*  --  6.9* 7.5* 8.0*  MG  --  1.1*  --  2.0  --    Liver Function Tests:  Recent Labs Lab 09/02/14 1526 09/03/14 0350 09/04/14 0350 09/05/14 0555  AST 34 53* 41* 50*  ALT 17 20 20 25   ALKPHOS 156* 137*  129* 145*  BILITOT 2.4* 2.0* 1.2 1.5*  PROT 6.3 5.6* 5.3* 6.3  ALBUMIN 2.3* 2.0* 1.9* 2.1*   No results for input(s): LIPASE, AMYLASE in the last 168 hours. No results for input(s): AMMONIA in the last 168 hours. CBC:  Recent Labs Lab 09/02/14 1526 09/03/14 0350 09/04/14 0350 09/05/14 0555  WBC 18.0* 15.5* 12.0* 12.5*  NEUTROABS 12.8*  --   --   --   HGB 9.5* 8.5* 8.1* 8.7*  HCT 25.9* 23.3* 22.6* 24.3*  MCV 99.2 100.9* 99.6 101.3*  PLT 195 204 207 257   Cardiac Enzymes:  Recent Labs Lab 09/02/14 1526  TROPONINI <0.03   BNP (last 3 results)  Recent Labs  09/02/14 1526  BNP 431.0*     ProBNP (last 3 results) No results for input(s): PROBNP in the last 8760 hours.  CBG: No results for input(s): GLUCAP in the last 168 hours.  Recent Results (from the past 240 hour(s))  Blood culture (routine x 2)     Status: None (Preliminary result)   Collection Time: 09/02/14  4:21 PM  Result Value Ref Range Status   Specimen Description BLOOD LEFT HAND  Final   Special Requests BOTTLES DRAWN AEROBIC AND ANAEROBIC 5CC EACH  Final   Culture   Final           BLOOD CULTURE RECEIVED NO GROWTH TO DATE CULTURE WILL BE HELD FOR 5 DAYS BEFORE ISSUING A FINAL NEGATIVE REPORT Performed at [facility]    Report Status PENDING  Incomplete  Blood culture (routine x 2)     Status: None (Preliminary result)   Collection Time: 09/02/14  4:21 PM  Result Value Ref Range Status   Specimen Description BLOOD LEFT ARM  Final   Special Requests BOTTLES DRAWN AEROBIC AND ANAEROBIC 5CC EACH  Final   Culture   Final    GRAM NEGATIVE RODS Performed at [facility]    Report Status PENDING  Incomplete  MRSA PCR Screening     Status: None   Collection Time: 09/02/14  7:03 PM  Result Value Ref Range Status   MRSA by PCR NEGATIVE NEGATIVE Final    Comment:        The GeneXpert MRSA Assay (FDA approved for NASAL specimens only), is one component of a comprehensive MRSA colonization surveillance program. It is not intended to diagnose MRSA infection nor to guide or monitor treatment for MRSA infections.   Clostridium Difficile by PCR     Status: None   Collection Time: 09/03/14 10:15 PM  Result Value Ref Range Status   C difficile by pcr NEGATIVE NEGATIVE Final    Comment: Performed at [facility]     Studies: No results found.  Scheduled Meds: . antiseptic oral rinse  7 mL Mouth Rinse BID  . diltiazem  120 mg Oral Daily  . doxycycline  100 mg Oral Q12H  . enoxaparin (LOVENOX) injection  40 mg Subcutaneous Q24H  . feeding supplement (ENSURE COMPLETE)  237 mL  Oral BID BM  . folic acid  1 mg Oral Daily  . LORazepam  0-4 mg Oral Q12H  . multivitamin with minerals  1 tablet Oral Daily  . piperacillin-tazobactam (ZOSYN)  IV  3.375 g Intravenous 3 times per day  . sodium chloride  3 mL Intravenous Q12H  . thiamine  100 mg Oral Daily   Or  . thiamine  100 mg Intravenous Daily   Continuous Infusions:   Time spent: > 35  minutes  Velvet Bathe  [facility] Pager [phone]. If 7PM-7AM, please contact night-coverage at [email or web address], password [facility] 09/05/2014, 5:21 PM  LOS: 3 days

## 2014-09-05 NOTE — Progress Notes (Signed)
Pt heart rate increased to 126.  EKG showed a fib.  Vitals signs stable, pt without any complaints.  Dr. Wendee Beavers notified, also discussed with central tele.  Will continue to monitor.

## 2014-09-06 ENCOUNTER — Encounter (HOSPITAL_COMMUNITY): Payer: Self-pay | Admitting: *Deleted

## 2014-09-06 LAB — CULTURE, BLOOD (ROUTINE X 2)

## 2014-09-06 MED ORDER — ADULT MULTIVITAMIN W/MINERALS CH: 1.0000 | ORAL_TABLET | Freq: Every day | ORAL | Status: DC

## 2014-09-06 MED ORDER — DILTIAZEM HCL ER COATED BEADS 120 MG PO CP24: 120.0000 mg | ORAL_CAPSULE | Freq: Every day | ORAL | Status: DC

## 2014-09-06 MED ORDER — DOXYCYCLINE HYCLATE 100 MG PO TABS: 100.0000 mg | ORAL_TABLET | Freq: Two times a day (BID) | ORAL | Status: DC

## 2014-09-06 MED ORDER — FOLIC ACID 1 MG PO TABS: 1.0000 mg | ORAL_TABLET | Freq: Every day | ORAL | Status: DC

## 2014-09-06 MED ORDER — CIPROFLOXACIN HCL 500 MG PO TABS: 500.0000 mg | ORAL_TABLET | Freq: Two times a day (BID) | ORAL | Status: DC

## 2014-09-06 NOTE — Progress Notes (Addendum)
ANTIBIOTIC CONSULT NOTE   Pharmacy Consult for Zosyn Indication: urosepsis, P. Aeruginosa bacteremia  Allergies  Allergen Reactions  . Ace Inhibitors Other (See Comments)    REACTION: Angioedema  . Aspirin Other (See Comments)    Nose bleeds   . Lisinopril Swelling and Other (See Comments)    REACTION: Facial Swelling  . Rocephin [Ceftriaxone Sodium In Dextrose] Hives    Previously tolerated PCN and Ceph 2/14 Hives with ceftriaxone dose 2/15 No reaction with benadryl/ceftriaxone 2/16 Tolerating Zosyn w/o reaction    Patient Measurements: Height: 6\' 3"  (190.5 cm) Weight: 205 lb 4 oz (93.1 kg) IBW/kg (Calculated) : 84.5  Vital Signs: Temp: 98.2 F (36.8 C) (02/18 0505) Temp Source: Oral (02/18 0505) BP: 133/74 mmHg (02/18 0505) Pulse Rate: 79 (02/18 0505) Intake/Output from previous day: 02/17 0701 - 02/18 0700 In: 340 [P.O.:240; IV Piggyback:100] Out: 425 [Urine:425]  Labs:  Recent Labs  09/04/14 0350 09/05/14 0555  WBC 12.0* 12.5*  HGB 8.1* 8.7*  PLT 207 257  CREATININE 0.91 0.95   Estimated Creatinine Clearance: 74.1 mL/min (by C-G formula based on Cr of 0.95). No results for input(s): VANCOTROUGH, VANCOPEAK, VANCORANDOM, GENTTROUGH, GENTPEAK, GENTRANDOM, TOBRATROUGH, TOBRAPEAK, TOBRARND, AMIKACINPEAK, AMIKACINTROU, AMIKACIN in the last 72 hours.   Microbiology: Recent Results (from the past 720 hour(s))  Blood culture (routine x 2)     Status: None (Preliminary result)   Collection Time: 09/02/14  4:21 PM  Result Value Ref Range Status   Specimen Description BLOOD LEFT HAND  Final   Special Requests BOTTLES DRAWN AEROBIC AND ANAEROBIC 5CC EACH  Final   Culture   Final           BLOOD CULTURE RECEIVED NO GROWTH TO DATE CULTURE WILL BE HELD FOR 5 DAYS BEFORE ISSUING A FINAL NEGATIVE REPORT Performed at [facility]    Report Status PENDING  Incomplete  Blood culture (routine x 2)     Status: None   Collection Time: 09/02/14  4:21 PM  Result  Value Ref Range Status   Specimen Description BLOOD LEFT ARM  Final   Special Requests BOTTLES DRAWN AEROBIC AND ANAEROBIC 5CC EACH  Final   Culture   Final    PSEUDOMONAS AERUGINOSA Note: Gram Stain Report Called to,Read Back By and Verified With: CAROLYN GRIGG ON 2.15.2016 AT 7:48P BY WILEJ Performed at [facility]    Report Status 09/06/2014 FINAL  Final   Organism ID, Bacteria PSEUDOMONAS AERUGINOSA  Final      Susceptibility   Pseudomonas aeruginosa - MIC*    CEFEPIME <=1 SENSITIVE Sensitive     CEFTAZIDIME 4 SENSITIVE Sensitive     CIPROFLOXACIN <=0.25 SENSITIVE Sensitive     GENTAMICIN <=1 SENSITIVE Sensitive     IMIPENEM 2 SENSITIVE Sensitive     PIP/TAZO 16 SENSITIVE Sensitive     TOBRAMYCIN <=1 SENSITIVE Sensitive     * PSEUDOMONAS AERUGINOSA  MRSA PCR Screening     Status: None   Collection Time: 09/02/14  7:03 PM  Result Value Ref Range Status   MRSA by PCR NEGATIVE NEGATIVE Final    Comment:        The GeneXpert MRSA Assay (FDA approved for NASAL specimens only), is one component of a comprehensive MRSA colonization surveillance program. It is not intended to diagnose MRSA infection nor to guide or monitor treatment for MRSA infections.   Clostridium Difficile by PCR     Status: None   Collection Time: 09/03/14 10:15 PM  Result Value  Ref Range Status   C difficile by pcr NEGATIVE NEGATIVE Final    Comment: Performed at [facility]    Assessment: 65 yoM presented to ED with suspected urosepsis. Pt arrived in ED with existing foley catheter and history of frequent UTIs.  Code sepsis called in ED.  Pharmacy is consulted to dose ceftriaxone initially, changed Zosyn for GNR growing in 1/2 blood cultures, further identified as pseudomonas.   Note new allergy to ceftriaxone, however pt has tolerated zosyn, ceftriaxone, and cephalexin in the past  2/14 >> ceftriaxone >> 2/15 2/15 >> vancomycin >> 2/16 2/15 >> zosyn >> 2/16 >> doxycyline  >>  Tmax: afebrile WBCs: improved, remains mildly elevated Renal: SCr WNL Lactic acid: 2.91  2/14 MRSA PCR: negative 2/14 blood x2: P. Aeruginosa 1/2 (pan-sensitive) 2/15 Cdiff: negative 2/14 urine: canceled  Goal of Therapy:  Appropriate abx dosing, eradication of infection  Plan:  Day #3 zosyn  Zosyn dose remains appropriate for renal function/indication but based on susceptibilities suggest narrowing antibiotics.  Considering recent rash possibly related to ceftriaxone, consider change to Ciprofloxacin to complete total 10 days of therapy  No dosing adjustment needed at this time  Doreene Eland, PharmD, BCPS.   Pager: [phone] 09/06/2014 10:21 AM

## 2014-09-06 NOTE — Progress Notes (Addendum)
Occupational Therapy Treatment Patient Details Name: Bob Wilson MRN: [id number] DOB: [birth year] Today's Date: 09/06/2014       OT comments  Pt wants to go home but feel he would benefit from SNF  Follow Up Recommendations  Home health OT;Supervision/Assistance - 24 hour;SNF    Equipment Recommendations  Other (comment) (pt says he has all equipment)    Recommendations for Other Services      Precautions / Restrictions Fall       Mobility Bed Mobility Overal bed mobility: Needs Assistance Bed Mobility: Supine to Sit     Supine to sit: Mod assist        Transfers Overall transfer level: Needs assistance Equipment used: Rolling walker (2 wheeled) Transfers: Sit to/from Stand Sit to Stand: Mod assist         General transfer comment: cues for hand placement , extra time to, boosting assist to stand and  assist to control descent.    Balance                                   ADL Overall ADL's : Needs assistance/impaired     Grooming: Sitting;Set up                   Toilet Transfer: BSC;Moderate assistance   Toileting- Clothing Manipulation and Hygiene: Maximal assistance;Sit to/from stand       Functional mobility during ADLs: Moderate assistance General ADL Comments: pt states wife can A.  OT explained benefit from rehab to pt. Pt wants to go home      Vision                     Perception     Praxis      Cognition   Behavior During Therapy: [facility] for tasks assessed/performed Overall Cognitive Status: Within Functional Limits for tasks assessed                       Extremity/Trunk Assessment               Exercises     Shoulder Instructions       General Comments      Pertinent Vitals/ Pain       Pain Assessment: No/denies pain  Home Living Family/patient expects to be discharged to:: Private residence Living Arrangements: Spouse/significant other                                       Prior Functioning/Environment              Frequency Min 2X/week     Progress Toward Goals  OT Goals(current goals can now be found in the care plan section)  Progress towards OT goals: Progressing toward goals  Acute Rehab OT Goals Patient Stated Goal: to go home ASAP, get a wheel chair OT Goal Formulation: With patient  Plan Discharge plan remains appropriate    Co-evaluation                 End of Session     Activity Tolerance Patient tolerated treatment well   Patient Left in chair;with call bell/phone within reach   Nurse Communication Mobility status        Time: 5176-1607 OT Time Calculation (min): 23 min  Charges: OT  General Charges $OT Visit: 1 Procedure OT Treatments $Self Care/Home Management : 23-37 mins  Casha Estupinan, Thereasa Parkin 09/06/2014, 11:42 AM

## 2014-09-06 NOTE — Progress Notes (Signed)
Physical Therapy Treatment Patient Details Name: Bob Wilson MRN: [id number] DOB: 01-20-1933 Today's Date: 09/06/2014    History of Present Illness Bob Wilson is a 81 y.o. male with history of heavy alcohol abuse, alcoholic cirrhosis and neuropathy, HTN, HLD, GERD, gout, empyema, medical noncompliance and urinary retention with indwelling Foley catheter, presented to the ED with generalized weakness    PT Comments    Pt assisted with short distance ambulation. Discussed SNF vs HHPT, PT recommending SNF however pt and spouse feel able to manage pt at home.  Follow Up Recommendations  SNF;Supervision/Assistance - 24 hour     Equipment Recommendations  None recommended by PT    Recommendations for Other Services       Precautions / Restrictions Precautions Precautions: Fall Precaution Comments: sternal    Mobility  Bed Mobility Overal bed mobility: Needs Assistance Bed Mobility: Supine to Sit     Supine to sit: Mod assist     General bed mobility comments: pt in chair  Transfers Overall transfer level: Needs assistance Equipment used: Rolling walker (2 wheeled) Transfers: Sit to/from Stand Sit to Stand: Min assist         General transfer comment: verbal cues for hand positioning and using armrests to self assist, increased time and effort, min assist to steady upon rise  Ambulation/Gait Ambulation/Gait assistance: Min guard Ambulation Distance (Feet): 80 Feet Assistive device: Rolling walker (2 wheeled) Gait Pattern/deviations: Step-through pattern;Decreased stride length;Trunk flexed     General Gait Details: pt reports fatiguing quickly since he has been in bed alot, states he cannot correct trunk flexion well due to back pain, verbal cues for posture and RW positioning   Stairs            Wheelchair Mobility    Modified Rankin (Stroke Patients Only)       Balance                                    Cognition  Arousal/Alertness: Awake/alert Behavior During Therapy: WFL for tasks assessed/performed Overall Cognitive Status: Within Functional Limits for tasks assessed                      Exercises      General Comments        Pertinent Vitals/Pain Pain Assessment: No/denies pain    Home Living Family/patient expects to be discharged to:: Private residence Living Arrangements: Spouse/significant other                  Prior Function            PT Goals (current goals can now be found in the care plan section) Acute Rehab PT Goals Patient Stated Goal: to go home ASAP, get a wheel chair Progress towards PT goals: Progressing toward goals    Frequency  Min 3X/week    PT Plan Current plan remains appropriate    Co-evaluation             End of Session Equipment Utilized During Treatment: Gait belt Activity Tolerance: Patient limited by fatigue Patient left: in chair;with call bell/phone within reach;with chair alarm set;with family/visitor present     Time: 1884-1660 PT Time Calculation (min) (ACUTE ONLY): 17 min  Charges:  $Gait Training: 8-22 mins                    G Codes:  Arlee Santosuosso,KATHrine E 09/06/2014, 1:07 PM Carmelia Bake, PT, DPT 09/06/2014 Pager: [phone]

## 2014-09-06 NOTE — Discharge Summary (Signed)
Physician Discharge Summary  Bob Wilson UKG:[id number] DOB: 10-Aug-1932 DOA: 09/02/2014  PCP: Owens Loffler, MD  Admit date: 09/02/2014 Discharge date: 09/06/2014  Time spent: > 35  minutes  Recommendations for Outpatient Follow-up:  1. Please be sure to follow up as indicated below 2. Reassess wbc levels and ensure they continue to trend down 3. Consider prolonging antibiotic regimen given post hospital follow should patient require it. Currently improved and afebrile. 4. Pt chose discharged home despite recommendations for patient discharge to skilled nursing facility. Have helped set up home PT 5. The patient was started on Cardizem  Discharge Diagnoses:  Principal Problem:   Complicated UTI (urinary tract infection)-indwelling Foley catheter. Active Problems:   Dyslipidemia   Essential hypertension   GERD   HYPONATREMIA   Leukocytosis   Medically noncompliant   Cirrhosis, alcoholic   Urinary retention   Sepsis   Bilateral lower leg cellulitis   Hypokalemia   Atrial fibrillation with RVR   Alcohol dependence   Sepsis secondary to UTI   Discharge Condition: Stable  Diet recommendation: Heart healthy  Filed Weights   09/04/14 1357 09/05/14 0605 09/06/14 0505  Weight: 93.2 kg (205 lb 7.5 oz) 91.9 kg (202 lb 9.6 oz) 93.1 kg (205 lb 4 oz)    History of present illness:  From original history of present illness 81 y.o. male with history of heavy alcohol abuse, alcoholic cirrhosis and neuropathy, HTN, HLD, GERD, gout, empyema, medical noncompliance and urinary retention with indwelling Foley catheter, presented to the ED with generalized weakness. History obtained from patient and his spouse at bedside. Patient states that he drinks "a couple of pints of gin" every day and the last time was last night. As per both, he was in his usual state of health until this afternoon when his wife attempted to stand him up from the chair and patient was extremely weak and slid down to  the floor without actual fall, injuries or LOC. She called EMS hoping that they would put him back in his chair but was advised to come to the ED for evaluation. Patient denied fever, chills, headache, sore throat, chest pain, palpitations, dyspnea, nausea, vomiting, abdominal pain or diarrhea. He states that his urine always smells bad. Urine color however has recently changed to "dark". Patient also has bilateral leg swelling, blisters and some pain for which she takes Lasix. He has unsteady gait and uses a walker. He has fallen multiple times last year but none this year. In the ED, temperature 101.2, tachycardic in the 140s, WBC 18 K, hemoglobin 9.5, sodium 131, potassium 2.9, UA suggestive of UTI, troponin 1, negative and chest x-ray without acute findings. He was started on sepsis protocol for complicated UTI. He was also found to be in A. fib with RVR and started on Cardizem. Hospitalist admission was requested.  Hospital Course:  Principal Problem:  Complicated UTI (urinary tract infection)-indwelling Foley catheter. - Patient is currently on Zosyn  Bacteremia - Patient growing one bottle out of 2 of Pseudomonas. Discuss with infectious disease specialist recommends one more week of antibiotics. As such will be discharged on 7 more days of Cipro. Organism was pansensitive  Active Problems:  Dyslipidemia  Essential hypertension - Patient to be discharged on Cardizem   GERD  HYPONATREMIA - resolved   Urinary retention -Chronic problem and Foley catheter changed in ED 2/14 - Recommend patient follow-up with urologist for further evaluation and recommendations   Sepsis - Sepsis resolved   Bilateral lower leg cellulitis -  Currently on doxycycline and will continue for four more days to complete a 7 day treatment course.   Hypokalemia - Resolved   Atrial fibrillation with RVR - Exacerbated at times secondary to active infection. - Patient reportedly chads score of 3  however not a candidate for long-term anticoagulation based on last progress note secondary to alcohol abuse, unsteady gait and fall risk as well as noncompliance. - Will recommend patient follow-up with cardiology within the next week for evaluation recommendations   Alcohol dependence - No signs of anxiety on day of discharge   Sepsis secondary to UTI - We'll treat for 7 more days of Cipro - No urine culture for sensitivities although blood cultures did grow Pseudomonas   Consultations:  None  Discharge Exam: Filed Vitals:   09/06/14 1318  BP: 154/83  Pulse: 85  Temp: 98.2 F (36.8 C)  Resp: 18    General: Pt in nad, alert and akake Cardiovascular: rrr, no mrg Respiratory: cta bl,  No wheezes  Discharge Instructions   Discharge Instructions    Call MD for:  redness, tenderness, or signs of infection (pain, swelling, redness, odor or green/yellow discharge around incision site)    Complete by:  As directed      Call MD for:  temperature >100.4    Complete by:  As directed      Diet - low sodium heart healthy    Complete by:  As directed      Discharge instructions    Complete by:  As directed   Please be sure to continue physical therapy at your home.  Take antibiotics as recommended. Follow up with your primary care physician in 2-5 days or sooner should any new concerns arise.  Also f/u with your urologist regarding routine care for your foley catheter.     Increase activity slowly    Complete by:  As directed           Current Discharge Medication List    START taking these medications   Details  ciprofloxacin (CIPRO) 500 MG tablet Take 1 tablet (500 mg total) by mouth 2 (two) times daily. Qty: 14 tablet, Refills: 0    diltiazem (CARDIZEM CD) 120 MG 24 hr capsule Take 1 capsule (120 mg total) by mouth daily. Qty: 30 capsule, Refills: 0    doxycycline (VIBRA-TABS) 100 MG tablet Take 1 tablet (100 mg total) by mouth every 12 (twelve) hours. Qty: 8  tablet, Refills: 0    folic acid (FOLVITE) 1 MG tablet Take 1 tablet (1 mg total) by mouth daily. Qty: 30 tablet, Refills: 0    Multiple Vitamin (MULTIVITAMIN WITH MINERALS) TABS tablet Take 1 tablet by mouth daily. Qty: 30 tablet, Refills: 0      CONTINUE these medications which have NOT CHANGED   Details  ENSURE (ENSURE) Take 118.5 mLs by mouth daily.     colchicine 0.6 MG tablet Take 0.6 mg by mouth 2 (two) times daily as needed (gout).      STOP taking these medications     furosemide (LASIX) 20 MG tablet      indomethacin (INDOCIN) 25 MG capsule      naproxen sodium (ANAPROX) 220 MG tablet        Allergies  Allergen Reactions  . Ace Inhibitors Other (See Comments)    REACTION: Angioedema  . Aspirin Other (See Comments)    Nose bleeds   . Lisinopril Swelling and Other (See Comments)    REACTION: Facial Swelling  .  Rocephin [Ceftriaxone Sodium In Dextrose] Hives    Previously tolerated PCN and Ceph 2/14 Hives with ceftriaxone dose 2/15 No reaction with benadryl/ceftriaxone 2/16 Tolerating Zosyn w/o reaction      The results of significant diagnostics from this hospitalization (including imaging, microbiology, ancillary and laboratory) are listed below for reference.    Significant Diagnostic Studies: Dg Chest Port 1 View  09/02/2014   CLINICAL DATA:  Fever and tachycardia ; difficulty breathing  EXAM: PORTABLE CHEST - 1 VIEW  COMPARISON:  Chest radiograph February 19, 2012 and chest CT March 23, 2012  FINDINGS: There is underlying emphysematous change. There is scarring in the right base. There is atelectatic change in the right mid lung. Elsewhere lungs are clear. Heart size and pulmonary vascularity are normal. No adenopathy. No bone lesions.  IMPRESSION: Scarring right base. Atelectasis right midlung. No edema or consolidation.   Electronically Signed   By: Lowella Grip III M.D.   On: 09/02/2014 16:10    Microbiology: Recent Results (from the past 240  hour(s))  Blood culture (routine x 2)     Status: None (Preliminary result)   Collection Time: 09/02/14  4:21 PM  Result Value Ref Range Status   Specimen Description BLOOD LEFT HAND  Final   Special Requests BOTTLES DRAWN AEROBIC AND ANAEROBIC 5CC EACH  Final   Culture   Final           BLOOD CULTURE RECEIVED NO GROWTH TO DATE CULTURE WILL BE HELD FOR 5 DAYS BEFORE ISSUING A FINAL NEGATIVE REPORT Performed at [facility]    Report Status PENDING  Incomplete  Blood culture (routine x 2)     Status: None   Collection Time: 09/02/14  4:21 PM  Result Value Ref Range Status   Specimen Description BLOOD LEFT ARM  Final   Special Requests BOTTLES DRAWN AEROBIC AND ANAEROBIC 5CC EACH  Final   Culture   Final    PSEUDOMONAS AERUGINOSA Note: Gram Stain Report Called to,Read Back By and Verified With: CAROLYN GRIGG ON 2.15.2016 AT 7:48P BY WILEJ Performed at [facility]    Report Status 09/06/2014 FINAL  Final   Organism ID, Bacteria PSEUDOMONAS AERUGINOSA  Final      Susceptibility   Pseudomonas aeruginosa - MIC*    CEFEPIME <=1 SENSITIVE Sensitive     CEFTAZIDIME 4 SENSITIVE Sensitive     CIPROFLOXACIN <=0.25 SENSITIVE Sensitive     GENTAMICIN <=1 SENSITIVE Sensitive     IMIPENEM 2 SENSITIVE Sensitive     PIP/TAZO 16 SENSITIVE Sensitive     TOBRAMYCIN <=1 SENSITIVE Sensitive     * PSEUDOMONAS AERUGINOSA  MRSA PCR Screening     Status: None   Collection Time: 09/02/14  7:03 PM  Result Value Ref Range Status   MRSA by PCR NEGATIVE NEGATIVE Final    Comment:        The GeneXpert MRSA Assay (FDA approved for NASAL specimens only), is one component of a comprehensive MRSA colonization surveillance program. It is not intended to diagnose MRSA infection nor to guide or monitor treatment for MRSA infections.   Clostridium Difficile by PCR     Status: None   Collection Time: 09/03/14 10:15 PM  Result Value Ref Range Status   C difficile by pcr NEGATIVE NEGATIVE  Final    Comment: Performed at [facility]: Basic Metabolic Panel:  Recent Labs Lab 09/02/14 1526 09/02/14 1806 09/03/14 0350 09/04/14 0350 09/05/14 0555  NA  131*  --  134* 134* 135  K 2.7*  --  3.8 4.4 4.8  CL 91*  --  99 102 101  CO2 28  --  28 28 25   GLUCOSE 109*  --  94 108* 95  BUN 9  --  9 10 9   CREATININE 0.97  --  0.87 0.91 0.95  CALCIUM 7.3*  --  6.9* 7.5* 8.0*  MG  --  1.1*  --  2.0  --    Liver Function Tests:  Recent Labs Lab 09/02/14 1526 09/03/14 0350 09/04/14 0350 09/05/14 0555  AST 34 53* 41* 50*  ALT 17 20 20 25   ALKPHOS 156* 137* 129* 145*  BILITOT 2.4* 2.0* 1.2 1.5*  PROT 6.3 5.6* 5.3* 6.3  ALBUMIN 2.3* 2.0* 1.9* 2.1*   No results for input(s): LIPASE, AMYLASE in the last 168 hours. No results for input(s): AMMONIA in the last 168 hours. CBC:  Recent Labs Lab 09/02/14 1526 09/03/14 0350 09/04/14 0350 09/05/14 0555  WBC 18.0* 15.5* 12.0* 12.5*  NEUTROABS 12.8*  --   --   --   HGB 9.5* 8.5* 8.1* 8.7*  HCT 25.9* 23.3* 22.6* 24.3*  MCV 99.2 100.9* 99.6 101.3*  PLT 195 204 207 257   Cardiac Enzymes:  Recent Labs Lab 09/02/14 1526  TROPONINI <0.03   BNP: BNP (last 3 results)  Recent Labs  09/02/14 1526  BNP 431.0*    ProBNP (last 3 results) No results for input(s): PROBNP in the last 8760 hours.  CBG: No results for input(s): GLUCAP in the last 168 hours.     Signed:  Velvet Bathe  [facility] 09/06/2014, 4:25 PM

## 2014-09-07 ENCOUNTER — Telehealth: Payer: Self-pay | Admitting: Family Medicine

## 2014-09-07 ENCOUNTER — Telehealth: Payer: Self-pay

## 2014-09-07 NOTE — Telephone Encounter (Signed)
Calvin with [facility] left v/m requesting cb to verify Dr Lorelei Pont will sign home health orders as attending.pt was discharged from [facility]. Please advise.

## 2014-09-07 NOTE — Telephone Encounter (Signed)
Spoke with pt; pt has had several admissions to hospital and pt cannot walk without assistance by walker or pts wife and pt request light weight w/c. Pt was seen in 07/2014 and wants to know if can get order for w/c or will pt need to be seen. Pt request cb.

## 2014-09-07 NOTE — Telephone Encounter (Signed)
Please move appointment to 09/12/2014 at 9:45am.

## 2014-09-07 NOTE — Telephone Encounter (Signed)
Deneise Lever called to make hospital follow they wanted him to follow up in 2-5 days he was discharged on 2/18. Made appointment for 3/2.  Can he be seen sooner please advise date and time.?

## 2014-09-08 DIAGNOSIS — I10 Essential (primary) hypertension: Secondary | ICD-10-CM | POA: Diagnosis not present

## 2014-09-08 DIAGNOSIS — F1020 Alcohol dependence, uncomplicated: Secondary | ICD-10-CM | POA: Diagnosis not present

## 2014-09-08 DIAGNOSIS — L03115 Cellulitis of right lower limb: Secondary | ICD-10-CM | POA: Diagnosis not present

## 2014-09-08 DIAGNOSIS — K219 Gastro-esophageal reflux disease without esophagitis: Secondary | ICD-10-CM | POA: Diagnosis not present

## 2014-09-08 DIAGNOSIS — L03116 Cellulitis of left lower limb: Secondary | ICD-10-CM | POA: Diagnosis not present

## 2014-09-08 DIAGNOSIS — K7030 Alcoholic cirrhosis of liver without ascites: Secondary | ICD-10-CM | POA: Diagnosis not present

## 2014-09-08 DIAGNOSIS — M6281 Muscle weakness (generalized): Secondary | ICD-10-CM | POA: Diagnosis not present

## 2014-09-08 DIAGNOSIS — I4891 Unspecified atrial fibrillation: Secondary | ICD-10-CM | POA: Diagnosis not present

## 2014-09-08 LAB — CULTURE, BLOOD (ROUTINE X 2): Culture: NO GROWTH

## 2014-09-08 NOTE — Telephone Encounter (Signed)
Move up his hospital follow-up 30 minute appointment ASAP please. Multiple reasons closer appt needed, HH face to face documentation

## 2014-09-10 NOTE — Telephone Encounter (Signed)
Yes, i will be attending for home health.

## 2014-09-10 NOTE — Telephone Encounter (Signed)
I spoke to patient's wife and scheduled appointment on 09/17/14 at 4:00.  It was the first available appointment.

## 2014-09-10 NOTE — Telephone Encounter (Signed)
Calvin with [facility] notified that Dr. Lorelei Pont would be the attending for home health.

## 2014-09-11 DIAGNOSIS — I4891 Unspecified atrial fibrillation: Secondary | ICD-10-CM | POA: Diagnosis not present

## 2014-09-11 DIAGNOSIS — F1020 Alcohol dependence, uncomplicated: Secondary | ICD-10-CM | POA: Diagnosis not present

## 2014-09-11 DIAGNOSIS — L03115 Cellulitis of right lower limb: Secondary | ICD-10-CM | POA: Diagnosis not present

## 2014-09-11 DIAGNOSIS — M6281 Muscle weakness (generalized): Secondary | ICD-10-CM | POA: Diagnosis not present

## 2014-09-11 DIAGNOSIS — I10 Essential (primary) hypertension: Secondary | ICD-10-CM | POA: Diagnosis not present

## 2014-09-11 DIAGNOSIS — K219 Gastro-esophageal reflux disease without esophagitis: Secondary | ICD-10-CM | POA: Diagnosis not present

## 2014-09-11 DIAGNOSIS — L03116 Cellulitis of left lower limb: Secondary | ICD-10-CM | POA: Diagnosis not present

## 2014-09-11 DIAGNOSIS — K7030 Alcoholic cirrhosis of liver without ascites: Secondary | ICD-10-CM | POA: Diagnosis not present

## 2014-09-11 NOTE — Telephone Encounter (Signed)
Do you mean 09/17/2014?  If so, that is fine.

## 2014-09-11 NOTE — Telephone Encounter (Signed)
Yes 2/29 sorry

## 2014-09-11 NOTE — Telephone Encounter (Signed)
Bob Wilson r/s to 3/29 is this ok?

## 2014-09-12 ENCOUNTER — Telehealth: Payer: Self-pay

## 2014-09-12 ENCOUNTER — Telehealth: Payer: Self-pay | Admitting: Family Medicine

## 2014-09-12 DIAGNOSIS — S81802A Unspecified open wound, left lower leg, initial encounter: Secondary | ICD-10-CM

## 2014-09-12 MED ORDER — FUROSEMIDE 20 MG PO TABS: 20.0000 mg | ORAL_TABLET | Freq: Every day | ORAL | Status: DC

## 2014-09-12 NOTE — Telephone Encounter (Signed)
Verbal order as below

## 2014-09-12 NOTE — Telephone Encounter (Signed)
Jancey notified as instructed by telephone.  Lasix prescription sent in to [facility] [address].  Called [facility] to order home health nursing for wound care.  They ask that we fax order to [phone].   He is scheduled for hospital follow up with Dr. Lorelei Pont 09/17/2014 at 4:00 pm.  Osborne Oman to contact urologist for catheter issue.  Will forward to Dr. Lorelei Pont to put in referral for wound care.

## 2014-09-12 NOTE — Telephone Encounter (Signed)
H/o noncompliance. He is in charge of his body.

## 2014-09-12 NOTE — Telephone Encounter (Signed)
Home health referral faxed to [facility].

## 2014-09-12 NOTE — Telephone Encounter (Signed)
Delane Ginger called back; today pt has wound on lower lt leg that looks like when swelling came down wound formed with drainage; [facility] PT in the home and [facility] request order for home health nursing to do wound care for pt. Today pts BP was 160/100 in lt arm and 150/90 in rt arm. Pt pocketing fluid in arms and sacrum and abdomen. Lasix was cancelled when pt discharged on 09/06/14 from [facility]. Weight today was 188 and pt got SOB with minimal activity and lungs sounded clear. Delane Ginger also thinks catheter needs to be addressed; pt presently has a leg bag for indwelling catheter; pt is to go to urologist on 09/20/14 for reevaluation and has appt with Dr Lorelei Pont on 09/17/14.Jancey request cb if med changed.

## 2014-09-12 NOTE — Telephone Encounter (Signed)
Restart Lasix 20 mg daily, ok to call in #30, 3 refills if he needs it.  [facility] to assess patient for wound care.   He has been having exceedingly complex bladder problems, and I am going to have to defer to his Urologist for this issue.   I will f/u in a few days, someone else can try to urgently work him in sooner if needed.   Electronically Signed  By: Owens Loffler, MD On: 09/12/2014 1:49 PM

## 2014-09-12 NOTE — Telephone Encounter (Signed)
Jancey with [facility] was in home of pt this morning and Jaycey request cb about clarification of medications. V/m not set up yet. Will try later.

## 2014-09-12 NOTE — Telephone Encounter (Signed)
Occupational services called to notify us that pt is refusing OT services.

## 2014-09-12 NOTE — Telephone Encounter (Signed)
Jancey,THN,called back and asked for Butch Penny to call her.  Please call her at [phone].

## 2014-09-13 ENCOUNTER — Encounter: Payer: Self-pay | Admitting: Internal Medicine

## 2014-09-13 ENCOUNTER — Ambulatory Visit (INDEPENDENT_AMBULATORY_CARE_PROVIDER_SITE_OTHER): Payer: Medicare Other | Admitting: Internal Medicine

## 2014-09-13 VITALS — BP 146/92 | HR 112 | Temp 97.8°F | Wt 203.0 lb

## 2014-09-13 DIAGNOSIS — R609 Edema, unspecified: Secondary | ICD-10-CM | POA: Diagnosis not present

## 2014-09-13 DIAGNOSIS — R269 Unspecified abnormalities of gait and mobility: Secondary | ICD-10-CM

## 2014-09-13 DIAGNOSIS — R0602 Shortness of breath: Secondary | ICD-10-CM

## 2014-09-13 NOTE — Telephone Encounter (Signed)
Patient has appointment today.  Wife is aware ok to get wheelchair.

## 2014-09-13 NOTE — Patient Instructions (Signed)

## 2014-09-13 NOTE — Progress Notes (Signed)
Subjective:    Patient ID: Bob Wilson, male    DOB: 19-Sep-1932, 81 y.o.   MRN: [id number]  HPI  Pt presents to the clinic today with c/o swelling of his elbows bilaterally. The swelling is painful. He denies redness or warmth of the elbows. This started 3 days ago. He was restarted on his Lasix yesterday by Dr. Lorelei Pont. He does have a history of cirrhosis and ? CHF.  Of note, he request RX today for a wheelchair. Ever since his hospital stay, he has had trouble getting around. He becomes short of breath with exertion but denies chest pain.  Review of Systems      Past Medical History  Diagnosis Date  . Anemia     NOS  . Arthritis   . Gout   . Hyperlipidemia   . Hypertension   . GERD (gastroesophageal reflux disease)   . Elevated PSA     multiple times, refused urology eval [facility] notes)  . ETOH abuse   . Medically noncompliant     noncompliant with follow ups, labs.  . Alcoholism 06/25/2011  . Cirrhosis, alcoholic 1/32/4401  . 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 ciprofloxacin (CIPRO) 500 MG tablet Take 1 tablet (500 mg total) by mouth 2 (two) times daily. 14 tablet 0  . colchicine 0.6 MG tablet Take 0.6 mg by mouth 2 (two) times daily as needed (gout).    Marland Kitchen diltiazem (CARDIZEM CD) 120 MG 24 hr capsule Take 1 capsule (120 mg total) by mouth daily. 30 capsule 0  . doxycycline (VIBRA-TABS) 100 MG tablet Take 1 tablet (100 mg total) by mouth every 12 (twelve) hours. 8 tablet 0  . ENSURE (ENSURE) Take 118.5 mLs by mouth daily.     . folic acid (FOLVITE) 1 MG tablet Take 1 tablet (1 mg total) by mouth daily. 30 tablet 0  . furosemide (LASIX) 20 MG tablet Take 1 tablet (20 mg total) by mouth daily. 30 tablet 3  . Multiple Vitamin (MULTIVITAMIN WITH MINERALS) TABS tablet Take 1 tablet by mouth daily. 30 tablet 0   No current facility-administered medications for this visit.    Allergies  Allergen  Reactions  . Ace Inhibitors Other (See Comments)    REACTION: Angioedema  . Aspirin Other (See Comments)    Nose bleeds   . Lisinopril Swelling and Other (See Comments)    REACTION: Facial Swelling  . Rocephin [Ceftriaxone Sodium In Dextrose] Hives    Previously tolerated PCN and Ceph 2/14 Hives with ceftriaxone dose 2/15 No reaction with benadryl/ceftriaxone 2/16 Tolerating Zosyn w/o reaction    No family history on file.  History   Social History  . Marital Status: Married    Spouse Name: N/A  . Number of Children: N/A  . Years of Education: N/A   Occupational History  . former Engineer, building services    Social History Main Topics  . Smoking status: Former Smoker    Types: Cigarettes    Quit date: 07/20/1974  . Smokeless tobacco: Never Used     Comment: Remote- quit 45 years ago.  . Alcohol Use: 0.0 oz/week     Comment: states that he drinks "couple pints of gin" every day.  . Drug Use: No  . Sexual Activity: Not Currently   Other Topics Concern  . Not on file   Social History Narrative     Constitutional: Pt reports fatigue. Denies fever, malaise,  headache or abrupt weight changes.  Respiratory: Pt reports shortness of breath. Denies difficulty breathing, cough or sputum production.   Cardiovascular: Pt reports swelling in BUE/BLE. Denies chest pain, chest tightness, palpitations.  Musculoskeletal: Pt reports difficulty with gait. Denies decrease in range of motion,  muscle pain or joint pain and swelling.  Skin: Denies redness, rashes, lesions or ulcercations.   No other specific complaints in a complete review of systems (except as listed in HPI above).  Objective:   Physical Exam   BP 146/92 mmHg  Pulse 112  Temp(Src) 97.8 F (36.6 C) (Oral)  Wt 203 lb (92.08 kg)  SpO2 95% Wt Readings from Last 3 Encounters:  09/13/14 203 lb (92.08 kg)  09/06/14 205 lb 4 oz (93.1 kg)  01/11/14 190 lb (86.183 kg)    General: Appears his stated age, chronically ill  appearing in NAD. Skin: Warm, dry and intact. Scaly, thickening of skin noted BLE. Cardiovascular: Tachycardic with normal rhythm. S1,S2 noted.  No murmur, rubs or gallops noted.  Pulmonary/Chest: Normal effort and fine crackles throughout. No respiratory distress. No wheezes or ronchi noted.  Musculoskeletal: Difficulty to assess, he is in wheelchair. Normal flexion and extension of elbows. 2+ pitting edema of bilateral elbows. Pitting edema noted of BLE. Neurological: Alert and oriented.    BMET    Component Value Date/Time   NA 135 09/05/2014 0555   K 4.8 09/05/2014 0555   CL 101 09/05/2014 0555   CO2 25 09/05/2014 0555   GLUCOSE 95 09/05/2014 0555   BUN 9 09/05/2014 0555   CREATININE 0.95 09/05/2014 0555   CALCIUM 8.0* 09/05/2014 0555   GFRNONAA 77* 09/05/2014 0555   GFRAA 89* 09/05/2014 0555    Lipid Panel     Component Value Date/Time   CHOL 131 08/14/2013 1540   TRIG 43.0 08/14/2013 1540   HDL 55.40 08/14/2013 1540   CHOLHDL 2 08/14/2013 1540   VLDL 8.6 08/14/2013 1540   LDLCALC 67 08/14/2013 1540    CBC    Component Value Date/Time   WBC 12.5* 09/05/2014 0555   RBC 2.40* 09/05/2014 0555   RBC 2.70* 01/18/2012 0818   HGB 8.7* 09/05/2014 0555   HCT 24.3* 09/05/2014 0555   PLT 257 09/05/2014 0555   MCV 101.3* 09/05/2014 0555   MCH 36.3* 09/05/2014 0555   MCHC 35.8 09/05/2014 0555   RDW 18.5* 09/05/2014 0555   LYMPHSABS 2.1 09/02/2014 1526   MONOABS 3.1* 09/02/2014 1526   EOSABS 0.0 09/02/2014 1526   BASOSABS 0.0 09/02/2014 1526    Hgb A1C No results found for: HGBA1C      Assessment & Plan:   Edema, shortness of breath on exertion:  Fluid overload on exam Advised him to increase his lasix to 40 mg Daily x 3 days CMET reviewed He is aware that he needs to keep his follow up appt with Dr. Lorelei Pont Monday 2/29  Gait disturbance:  RX for wheelchair provided

## 2014-09-13 NOTE — Progress Notes (Signed)
Pre visit review using our clinic review tool, if applicable. No additional management support is needed unless otherwise documented below in the visit note. 

## 2014-09-13 NOTE — Telephone Encounter (Signed)
Definitely ok to get wheelchair. HH may be able to assist or it may be faster for his wife to try to get from DME store with written script from Ms. Baity, who I just spoke to.  Significant concern with that much edema - likely needs diuresis. He has f/u appt scheduled with me for Monday, which he should also keep.  Thank-you.

## 2014-09-14 DIAGNOSIS — K219 Gastro-esophageal reflux disease without esophagitis: Secondary | ICD-10-CM | POA: Diagnosis not present

## 2014-09-14 DIAGNOSIS — K7030 Alcoholic cirrhosis of liver without ascites: Secondary | ICD-10-CM | POA: Diagnosis not present

## 2014-09-14 DIAGNOSIS — I10 Essential (primary) hypertension: Secondary | ICD-10-CM | POA: Diagnosis not present

## 2014-09-14 DIAGNOSIS — L03115 Cellulitis of right lower limb: Secondary | ICD-10-CM | POA: Diagnosis not present

## 2014-09-14 DIAGNOSIS — L03116 Cellulitis of left lower limb: Secondary | ICD-10-CM | POA: Diagnosis not present

## 2014-09-14 DIAGNOSIS — M6281 Muscle weakness (generalized): Secondary | ICD-10-CM | POA: Diagnosis not present

## 2014-09-14 DIAGNOSIS — F1020 Alcohol dependence, uncomplicated: Secondary | ICD-10-CM | POA: Diagnosis not present

## 2014-09-14 DIAGNOSIS — I4891 Unspecified atrial fibrillation: Secondary | ICD-10-CM | POA: Diagnosis not present

## 2014-09-17 ENCOUNTER — Ambulatory Visit (INDEPENDENT_AMBULATORY_CARE_PROVIDER_SITE_OTHER): Payer: Medicare Other | Admitting: Family Medicine

## 2014-09-17 ENCOUNTER — Encounter: Payer: Self-pay | Admitting: Family Medicine

## 2014-09-17 VITALS — BP 134/76 | HR 84 | Temp 98.6°F | Ht 72.75 in

## 2014-09-17 DIAGNOSIS — E46 Unspecified protein-calorie malnutrition: Secondary | ICD-10-CM | POA: Diagnosis not present

## 2014-09-17 DIAGNOSIS — I4891 Unspecified atrial fibrillation: Secondary | ICD-10-CM

## 2014-09-17 DIAGNOSIS — R339 Retention of urine, unspecified: Secondary | ICD-10-CM | POA: Diagnosis not present

## 2014-09-17 DIAGNOSIS — N390 Urinary tract infection, site not specified: Secondary | ICD-10-CM | POA: Diagnosis not present

## 2014-09-17 MED ORDER — FUROSEMIDE 20 MG PO TABS: 20.0000 mg | ORAL_TABLET | Freq: Every day | ORAL | Status: DC

## 2014-09-17 MED ORDER — DILTIAZEM HCL ER COATED BEADS 120 MG PO CP24: 120.0000 mg | ORAL_CAPSULE | Freq: Every day | ORAL | Status: DC

## 2014-09-17 NOTE — Progress Notes (Signed)
Pre visit review using our clinic review tool, if applicable. No additional management support is needed unless otherwise documented below in the visit note. 

## 2014-09-17 NOTE — Progress Notes (Signed)
Dr. Frederico Hamman T. Markela Wee, MD, [facility] [place], [address] Phone: [phone] Fax: [phone]  09/17/2014  Patient: Bob Wilson, MRN: [id number], DOB: [birth year], 81 y.o.  Primary Physician:  Owens Loffler, MD  Chief Complaint: Hospitalization Follow-up  Subjective:   Bob Wilson is a 81 y.o. very pleasant male patient who presents with the following:  Hospital f/u: Briefly, the patient has been admitted on September 02, 2014 with a complex UTI after having some persistent urinary retention and having an indwelling Foley catheter.  Urology has been unable to discontinue this.  Complications of the hospitalization include onset of atrial fibrillation with RVR.  This was broken with calcium channel blocker.  The patient is a chronic alcoholic drinking 2 pints of gin a day, and has historically been noncompliant, and the inpatient team felt that he was not a good anticoagulation candidate.  He also presented with a sepsis picture, and he also developed some lower leg cellulitis.  He was treated for alcohol dependence with an Ativan protocol in the hospital, and he tells me that he has not been drinking since discharge.  Roughly, he is doing reasonably okay.  He has not been eating much according to him or his wife, and he is not ambulating well at all.  The hospital discontinued his Lasix, and he developed very significant edema of the lower extremities, upper extremities and lower back.  My colleague saw him at the end of last week and restarted his Lasix, and he has diuresed and is feeling better.  He is still getting short of breath.  Admit date: 09/02/2014 Discharge date: 09/06/2014  Recommendations for Outpatient Follow-up:  1. Please be sure to follow up as indicated below 2. Reassess wbc levels and ensure they continue to trend down 3. Consider prolonging antibiotic regimen given post hospital follow should patient  require it. Currently improved and afebrile. 4. Pt chose discharged home despite recommendations for patient discharge to skilled nursing facility. Have helped set up home PT 5. The patient was started on Cardizem  Discharge Diagnoses:  Principal Problem:  Complicated UTI (urinary tract infection)-indwelling Foley catheter. Active Problems:  Dyslipidemia  Essential hypertension  GERD  HYPONATREMIA  Leukocytosis  Medically noncompliant  Cirrhosis, alcoholic  Urinary retention  Sepsis  Bilateral lower leg cellulitis  Hypokalemia  Atrial fibrillation with RVR  Alcohol dependence  Sepsis secondary to UTI  Past Medical History, Surgical History, Social History, Family History, Problem List, Medications, and Allergies have been reviewed and updated if relevant.   GEN: No acute illnesses, no fevers, chills. GI: No n/v/d, eating normally Pulm: No SOB Interactive and getting along well at home.  Otherwise, ROS is as per the HPI.  Objective:   BP 134/76 mmHg  Pulse 84  Temp(Src) 98.6 F (37 C) (Oral)  Ht 6' 0.75" (1.848 m)  Wt   GEN: WDWN, NAD, Non-toxic, A & O x 3 HEENT: Atraumatic, Normocephalic. Neck supple. No masses, No LAD. Ears and Nose: No external deformity. CV: RRR, No M/G/R. No JVD. No thrill. No extra heart sounds. PULM: CTA B, no wheezes, crackles, rhonchi. No retractions. No resp. distress. No accessory muscle use. EXTR: 2+ LE edema with venous stasis changes, no UE edema. L wound breakdown Lower leg, being dressed by [facility] and wife NEURO in wheelchair PSYCH: Normally interactive. Conversant. Not depressed or anxious appearing.  Calm demeanor.   Laboratory and Imaging Data: CBC:    Component  Value Date/Time   WBC 12.5* 09/05/2014 0555   HGB 8.7* 09/05/2014 0555   HCT 24.3* 09/05/2014 0555   PLT 257 09/05/2014 0555   MCV 101.3* 09/05/2014 0555   NEUTROABS 12.8* 09/02/2014 1526   LYMPHSABS 2.1 09/02/2014 1526   MONOABS 3.1* 09/02/2014 1526     EOSABS 0.0 09/02/2014 1526   BASOSABS 0.0 09/02/2014 1526    Comprehensive Metabolic Panel:    Component Value Date/Time   NA 135 09/05/2014 0555   K 4.8 09/05/2014 0555   CL 101 09/05/2014 0555   CO2 25 09/05/2014 0555   BUN 9 09/05/2014 0555   CREATININE 0.95 09/05/2014 0555   GLUCOSE 95 09/05/2014 0555   CALCIUM 8.0* 09/05/2014 0555   AST 50* 09/05/2014 0555   ALT 25 09/05/2014 0555   ALKPHOS 145* 09/05/2014 0555   BILITOT 1.5* 09/05/2014 0555   PROT 6.3 09/05/2014 0555   ALBUMIN 2.1* 09/05/2014 0555    Assessment and Plan:   Atrial fibrillation with RVR  Complicated UTI (urinary tract infection)-indwelling Foley catheter.  Urinary retention  Protein-calorie malnutrition  Wheelchair orders for advanced home care: Lightweight wheelchair. LON: 99 years Patient needs wheelchair to perform activities of daily living Patient unable to use other ambulatory aids Patient can safely self propel wheelchair or caregiver (wife) can assist.  The patient has an albumin of 2.1.  Significant concerns regarding his protein calorie malnutrition.  I told him and his wife that he needs to eat at all costs, and that basically he can eat virtually anything that he wants to eat.  Congratulated him off alcohol.  Recommended that he try eating and ensure, boost, or a plane milkshake after his meals.  Chronic urinary retention is difficult.  I'm going to defer to urology who is seeing him in a few days.  Regular rate and rhythm today on calcium channel blocker.  Agree with no anticoagulation in this case.  We are assisting him to try to get a wheelchair, preferably a light wheelchair.  We have placed these orders.  Follow-up: Return in about 4 months (around 01/16/2015).  Signed,  Maud Deed. Tykel Badie, MD   Patient's Medications  New Prescriptions   No medications on file  Previous Medications   COLCHICINE 0.6 MG TABLET    Take 0.6 mg by mouth 2 (two) times daily as needed (gout).    ENSURE (ENSURE)    Take 118.5 mLs by mouth daily.    FOLIC ACID (FOLVITE) 1 MG TABLET    Take 1 tablet (1 mg total) by mouth daily.   MULTIPLE VITAMIN (MULTIVITAMIN WITH MINERALS) TABS TABLET    Take 1 tablet by mouth daily.  Modified Medications   Modified Medication Previous Medication   DILTIAZEM (CARDIZEM CD) 120 MG 24 HR CAPSULE diltiazem (CARDIZEM CD) 120 MG 24 hr capsule      Take 1 capsule (120 mg total) by mouth daily.    Take 1 capsule (120 mg total) by mouth daily.   FUROSEMIDE (LASIX) 20 MG TABLET furosemide (LASIX) 20 MG tablet      Take 1 tablet (20 mg total) by mouth daily.    Take 1 tablet (20 mg total) by mouth daily.  Discontinued Medications   CIPROFLOXACIN (CIPRO) 500 MG TABLET    Take 1 tablet (500 mg total) by mouth 2 (two) times daily.   DOXYCYCLINE (VIBRA-TABS) 100 MG TABLET    Take 1 tablet (100 mg total) by mouth every 12 (twelve) hours.   TAMSULOSIN (FLOMAX) 0.4 MG  CAPS CAPSULE    Take 0.4 mg by mouth daily.

## 2014-09-18 DIAGNOSIS — L03115 Cellulitis of right lower limb: Secondary | ICD-10-CM | POA: Diagnosis not present

## 2014-09-18 DIAGNOSIS — F1020 Alcohol dependence, uncomplicated: Secondary | ICD-10-CM | POA: Diagnosis not present

## 2014-09-18 DIAGNOSIS — I10 Essential (primary) hypertension: Secondary | ICD-10-CM | POA: Diagnosis not present

## 2014-09-18 DIAGNOSIS — M6281 Muscle weakness (generalized): Secondary | ICD-10-CM | POA: Diagnosis not present

## 2014-09-18 DIAGNOSIS — I4891 Unspecified atrial fibrillation: Secondary | ICD-10-CM | POA: Diagnosis not present

## 2014-09-18 DIAGNOSIS — L03116 Cellulitis of left lower limb: Secondary | ICD-10-CM | POA: Diagnosis not present

## 2014-09-18 DIAGNOSIS — K219 Gastro-esophageal reflux disease without esophagitis: Secondary | ICD-10-CM | POA: Diagnosis not present

## 2014-09-18 DIAGNOSIS — K7030 Alcoholic cirrhosis of liver without ascites: Secondary | ICD-10-CM | POA: Diagnosis not present

## 2014-09-19 ENCOUNTER — Ambulatory Visit: Payer: Medicare Other | Admitting: Family Medicine

## 2014-09-19 DIAGNOSIS — K7030 Alcoholic cirrhosis of liver without ascites: Secondary | ICD-10-CM | POA: Diagnosis not present

## 2014-09-19 DIAGNOSIS — F1020 Alcohol dependence, uncomplicated: Secondary | ICD-10-CM | POA: Diagnosis not present

## 2014-09-19 DIAGNOSIS — L03115 Cellulitis of right lower limb: Secondary | ICD-10-CM | POA: Diagnosis not present

## 2014-09-19 DIAGNOSIS — K219 Gastro-esophageal reflux disease without esophagitis: Secondary | ICD-10-CM | POA: Diagnosis not present

## 2014-09-19 DIAGNOSIS — M6281 Muscle weakness (generalized): Secondary | ICD-10-CM | POA: Diagnosis not present

## 2014-09-19 DIAGNOSIS — I4891 Unspecified atrial fibrillation: Secondary | ICD-10-CM | POA: Diagnosis not present

## 2014-09-19 DIAGNOSIS — L03116 Cellulitis of left lower limb: Secondary | ICD-10-CM | POA: Diagnosis not present

## 2014-09-19 DIAGNOSIS — I10 Essential (primary) hypertension: Secondary | ICD-10-CM | POA: Diagnosis not present

## 2014-09-20 DIAGNOSIS — N390 Urinary tract infection, site not specified: Secondary | ICD-10-CM | POA: Diagnosis not present

## 2014-09-20 DIAGNOSIS — R338 Other retention of urine: Secondary | ICD-10-CM | POA: Diagnosis not present

## 2014-09-21 ENCOUNTER — Telehealth: Payer: Self-pay | Admitting: Family Medicine

## 2014-09-21 DIAGNOSIS — I10 Essential (primary) hypertension: Secondary | ICD-10-CM | POA: Diagnosis not present

## 2014-09-21 DIAGNOSIS — M6281 Muscle weakness (generalized): Secondary | ICD-10-CM | POA: Diagnosis not present

## 2014-09-21 DIAGNOSIS — L03115 Cellulitis of right lower limb: Secondary | ICD-10-CM | POA: Diagnosis not present

## 2014-09-21 DIAGNOSIS — K219 Gastro-esophageal reflux disease without esophagitis: Secondary | ICD-10-CM | POA: Diagnosis not present

## 2014-09-21 DIAGNOSIS — K7030 Alcoholic cirrhosis of liver without ascites: Secondary | ICD-10-CM | POA: Diagnosis not present

## 2014-09-21 DIAGNOSIS — L03116 Cellulitis of left lower limb: Secondary | ICD-10-CM | POA: Diagnosis not present

## 2014-09-21 DIAGNOSIS — F1020 Alcohol dependence, uncomplicated: Secondary | ICD-10-CM | POA: Diagnosis not present

## 2014-09-21 DIAGNOSIS — I4891 Unspecified atrial fibrillation: Secondary | ICD-10-CM | POA: Diagnosis not present

## 2014-09-21 NOTE — Telephone Encounter (Signed)
Verbal order given to Stacy with [facility] to continue PT in the home.

## 2014-09-21 NOTE — Telephone Encounter (Signed)
[facility]  would like to get a verbal order for continued physical therapy in home.

## 2014-09-22 NOTE — Telephone Encounter (Signed)
agree

## 2014-09-24 DIAGNOSIS — M6281 Muscle weakness (generalized): Secondary | ICD-10-CM | POA: Diagnosis not present

## 2014-09-24 DIAGNOSIS — F1020 Alcohol dependence, uncomplicated: Secondary | ICD-10-CM | POA: Diagnosis not present

## 2014-09-24 DIAGNOSIS — K7030 Alcoholic cirrhosis of liver without ascites: Secondary | ICD-10-CM | POA: Diagnosis not present

## 2014-09-24 DIAGNOSIS — I4891 Unspecified atrial fibrillation: Secondary | ICD-10-CM | POA: Diagnosis not present

## 2014-09-24 DIAGNOSIS — I10 Essential (primary) hypertension: Secondary | ICD-10-CM | POA: Diagnosis not present

## 2014-09-24 DIAGNOSIS — L03116 Cellulitis of left lower limb: Secondary | ICD-10-CM | POA: Diagnosis not present

## 2014-09-24 DIAGNOSIS — L03115 Cellulitis of right lower limb: Secondary | ICD-10-CM | POA: Diagnosis not present

## 2014-09-24 DIAGNOSIS — K219 Gastro-esophageal reflux disease without esophagitis: Secondary | ICD-10-CM | POA: Diagnosis not present

## 2014-09-25 DIAGNOSIS — K7030 Alcoholic cirrhosis of liver without ascites: Secondary | ICD-10-CM | POA: Diagnosis not present

## 2014-09-25 DIAGNOSIS — F1020 Alcohol dependence, uncomplicated: Secondary | ICD-10-CM | POA: Diagnosis not present

## 2014-09-25 DIAGNOSIS — I10 Essential (primary) hypertension: Secondary | ICD-10-CM | POA: Diagnosis not present

## 2014-09-25 DIAGNOSIS — I4891 Unspecified atrial fibrillation: Secondary | ICD-10-CM | POA: Diagnosis not present

## 2014-09-25 DIAGNOSIS — M6281 Muscle weakness (generalized): Secondary | ICD-10-CM | POA: Diagnosis not present

## 2014-09-25 DIAGNOSIS — K219 Gastro-esophageal reflux disease without esophagitis: Secondary | ICD-10-CM | POA: Diagnosis not present

## 2014-09-25 DIAGNOSIS — L03116 Cellulitis of left lower limb: Secondary | ICD-10-CM | POA: Diagnosis not present

## 2014-09-25 DIAGNOSIS — L03115 Cellulitis of right lower limb: Secondary | ICD-10-CM | POA: Diagnosis not present

## 2014-09-26 ENCOUNTER — Encounter: Payer: Self-pay | Admitting: Cardiology

## 2014-09-26 ENCOUNTER — Ambulatory Visit (INDEPENDENT_AMBULATORY_CARE_PROVIDER_SITE_OTHER): Payer: Medicare Other | Admitting: Cardiology

## 2014-09-26 VITALS — BP 142/80 | HR 117 | Ht 75.5 in | Wt 193.7 lb

## 2014-09-26 DIAGNOSIS — E785 Hyperlipidemia, unspecified: Secondary | ICD-10-CM | POA: Diagnosis not present

## 2014-09-26 DIAGNOSIS — R0609 Other forms of dyspnea: Secondary | ICD-10-CM

## 2014-09-26 DIAGNOSIS — R600 Localized edema: Secondary | ICD-10-CM | POA: Diagnosis not present

## 2014-09-26 DIAGNOSIS — I10 Essential (primary) hypertension: Secondary | ICD-10-CM

## 2014-09-26 DIAGNOSIS — I4891 Unspecified atrial fibrillation: Secondary | ICD-10-CM

## 2014-09-26 MED ORDER — DILTIAZEM HCL ER COATED BEADS 240 MG PO CP24: 240.0000 mg | ORAL_CAPSULE | Freq: Every day | ORAL | Status: AC

## 2014-09-26 MED ORDER — ASPIRIN EC 81 MG PO TBEC: 81.0000 mg | DELAYED_RELEASE_TABLET | Freq: Every day | ORAL | Status: DC

## 2014-09-26 MED ORDER — FUROSEMIDE 40 MG PO TABS: 40.0000 mg | ORAL_TABLET | Freq: Every day | ORAL | Status: DC

## 2014-09-26 NOTE — Assessment & Plan Note (Signed)
Last lipid panel was pretty much at goal see above. Labs reviewed

## 2014-09-26 NOTE — Assessment & Plan Note (Signed)
Probably related to A. Fib RVR and some diastolic dysfunction. He clearly has deconditioning. He probably does not tolerate being RVR. That'll explain episodes of exertional dyspnea but not persistent exertional dyspnea.  Plan: Lexiscan Myoview to evaluate for ischemic etiology.  Increase Lasix to 40 mg daily

## 2014-09-26 NOTE — Assessment & Plan Note (Signed)
He has chronic edema which is probably combination of venous stasis, edema and protein malnutrition. Increasing Lasix to 40 mg daily. Recommend support hose/compression stockings   Dietary supplementation as indicated by PCP

## 2014-09-26 NOTE — Assessment & Plan Note (Addendum)
Inadequate rate control. He probably will need more than just one agent for rate control however for now I will increase his diltiazem to 240 mg daily. We will see him back shortly after his stress test and potentially either increase the diltiazem further or use beta blocker plus or minus digoxin.  He likely is not a good endocrinology candidate for now to see how stable he will be. Probably does not have the best of coagulation factors from his alcohol-related likely liver disease. However his CHA2DS2Vasc Score is ~2 and not 3 (Age & HTN only, he has normal LV function, is not diabetic, no diagnosed vascular disease or stroke, male)  Plan: Increase diltiazem 240 mg daily, start aspirin 81 mg daily for stroke prophylaxis.  As this is a new diagnosis of atrial fibrillation, Lexiscan Myoview to evaluate for ischemic etiology.

## 2014-09-26 NOTE — Assessment & Plan Note (Signed)
bborderline control. This does be plenty of room to use calcium channel blockers and beta blockers for rate control.

## 2014-09-26 NOTE — Patient Instructions (Signed)
START 81 MG ENTERIC COATED ASPIRIN ONE DAILY  INCREASE DILTIAZEM CD 240 MG ONE CAPSULE DAILY  INCREASE FUROSEMIDE (LASIX) 40 MG  TABLET DAILY.  Your physician has requested that you have a lexiscan myoview. For further information please visit [email or web address]. Please follow instruction sheet, as given.   Your physician wants you to follow-up in 4 -6 weeks Dr Ellyn Hack- follow -up results.  You will receive a reminder letter in the mail two months in advance. If you don't receive a letter, please call our office to schedule the follow-up appointment.

## 2014-09-26 NOTE — Progress Notes (Signed)
PATIENT: Bob Wilson MRN: [id number] DOB: 1933/01/14 PCP: Owens Loffler, MD  Clinic Note: Chief Complaint  Patient presents with  . Hospitalization Follow-up    no chest discomfort, or rapid heart beat - become short winded if up and moving, some swelling in lower leg  . Atrial Fibrillation  . Leg Swelling    HPI: Bob Wilson is a 81 y.o. male with a PMH below who presents today for initial cardiology evaluation for her atrial fibrillation and edema. He was admitted on February 14 and discharged on the 18th weighted the complex UTI (Pseudomonas with urinary retention and required an indwelling Foley catheter. He was somewhat complicated his course by A. Fib with RVR as well as borderline sepsis. He was rate controlled with diltiazem, but was not thought to be a good endocrine ablation candidate due to his chronic alcohol use and unsteady gait..  he was noted to have malnutrition low protein levels while he was in the hospital. He is really not been eating very well since before his hospitalization.  Recommendations were discharged to nursing facility, however the patient refused and wanted to be discharged home.  He still has an indwelling Foley catheter.  Interval History: since his hospital stay, he has been doing rehabilitation. He says he does note significant exertional dyspnea with walking he had. He doesn't necessarily note any rapid or irregular heartbeats but has sometimes when he is more short of breath than others. He has chronic lower cavity edema but has gotten worse since his hospital stay. He is taking standing dose of Lasix, this is not really eating very much. He denies any PND or orthopnea. No chest tightness or pressure with rest or exertion. No syncope/near syncope or TIA/amaurosis fugax.  He has the ulcer on his left shin and part of his hospital stay was complicated by cellulitis in this area.  Past Medical History  Diagnosis Date  . Anemia     NOS  .  Arthritis   . Gout   . Hyperlipidemia   . Hypertension   . GERD (gastroesophageal reflux disease)   . Elevated PSA     multiple times, refused urology eval [facility] notes)  . ETOH abuse   . Medically noncompliant     noncompliant with follow ups, labs.  . Alcoholism 06/25/2011  . Cirrhosis, alcoholic 0/02/6760  . 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 Paroxysmal atrial fibrillation   . Bilateral lower extremity edema     Chronic.  Marland Kitchen History of empyema of pleura 12/2011    s/p VATS decortication    Prior Cardiac Evaluation and Past Surgical History: Past Surgical History  Procedure Laterality Date  . Lipoma excision    . Transthoracic echocardiogram  09/03/2014    Normal overall LV size. EF 55-60% with no regional W. MA. Trivial AI. Mild-moderate MR. Mild R. and LA dilation. Mild to moderate TR. PA pressures ~47 mmHg  . Video assisted thoracoscopy (vats)/empyema  12/2011    Allergies  Allergen Reactions  . Ace Inhibitors Other (See Comments)    REACTION: Angioedema  . Aspirin Other (See Comments)    Nose bleeds   . Lisinopril Swelling and Other (See Comments)    REACTION: Facial Swelling  . Rocephin [Ceftriaxone Sodium In Dextrose] Hives    Previously tolerated PCN and Ceph 2/14 Hives with ceftriaxone dose 2/15 No reaction with benadryl/ceftriaxone 2/16 Tolerating Zosyn w/o reaction    Current Outpatient Prescriptions  Medication Sig Dispense Refill  . colchicine  0.6 MG tablet Take 0.6 mg by mouth 2 (two) times daily as needed (gout).    . ENSURE (ENSURE) Take 118.5 mLs by mouth daily.     . folic acid (FOLVITE) 1 MG tablet Take 1 tablet (1 mg total) by mouth daily. 30 tablet 0  . Multiple Vitamin (MULTIVITAMIN WITH MINERALS) TABS tablet Take 1 tablet by mouth daily. 30 tablet 0  . aspirin EC 81 MG tablet Take 1 tablet (81 mg total) by mouth daily. 90 tablet 3  . diltiazem (CARDIZEM CD) 240 MG 24 hr capsule Take 1 capsule (240 mg total) by mouth daily. 30 capsule 11  .  furosemide (LASIX) 40 MG tablet Take 1 tablet (40 mg total) by mouth daily. 30 tablet 11   No current facility-administered medications for this visit.    History   Social History Narrative   He does not exercise much because of unsteady gait. He does have a walk and use range of motion at least 2 days a week with physical therapy.    family history is not on file.  He is not aware of any major cardiac disease in his family. He has 2 healthy children,  His mother and father are both deceased. He has 4 living  Brothers and one living sister with 3 sisters have since passed away.  ROS: A comprehensive Review of Systems - was performed Review of Systems  Constitutional: Positive for weight loss (Poor by mouth intake. Has documented protein malnutrition).  Respiratory: Positive for shortness of breath (With minor exertion; definitely notes it with his rehabilitation.).   Cardiovascular: Positive for leg swelling (Bilateral leg swelling up to then just above the knees. This is chronic but worse than usual). Negative for chest pain.  Gastrointestinal: Negative for blood in stool and melena.  Genitourinary: Negative for dysuria, hematuria and flank pain.       Still has an indwelling Foley  Musculoskeletal: Positive for joint pain and falls (No recent fall).  Neurological: Positive for dizziness (With position. Needs to hold onto something for balance.). Negative for seizures, loss of consciousness and headaches.       Unsteady, slow gait  Endo/Heme/Allergies: Bruises/bleeds easily.  Psychiatric/Behavioral: Negative for depression.       He states he has not had one one shot of alcohol in 6 weeks. Prior to that he would also point of Gin a day  All other systems reviewed and are negative.  PHYSICAL EXAM BP 142/80 mmHg  Pulse 117  Ht 6' 3.5" (1.918 m)  Wt 193 lb 11.2 oz (87.862 kg)  BMI 23.88 kg/m2 Physical Exam  Constitutional: He is oriented to person, place, and time. He appears  well-developed. No distress.  Frail, chronically ill appearing. Extensive almost anasarca-like lower extremity edema but very thin and frail upper body and gaunt face.  HENT:  Head: Normocephalic and atraumatic.  Nose: Nose normal.  Mouth/Throat: Oropharynx is clear and moist. No oropharyngeal exudate.  Eyes: Conjunctivae and EOM are normal. Pupils are equal, round, and reactive to light.  Neck: Normal range of motion. Neck supple. JVD present.  Cardiovascular: An irregularly irregular rhythm present. Tachycardia present.  PMI is displaced.  Exam reveals decreased pulses (very difficult to palpate pedal pulses due to the edema). Exam reveals no gallop, no friction rub, no midsystolic click and no opening snap.   Murmur heard. High-pitched blowing holosystolic murmur is present with a grade of 2/6  at the apex Pulmonary/Chest: Effort normal. No respiratory distress. He has  no wheezes. He exhibits no tenderness.  Diffuse mild basal crackles but no rales  Abdominal: Soft. Bowel sounds are normal. He exhibits no distension and no mass. There is no tenderness. There is no rebound and no guarding.  Genitourinary:  Indwelling Foley catheter in place with bag strapped to left leg  Musculoskeletal: Normal range of motion. He exhibits edema (tenderness 3-4+ pitting edema  up to almost his thighs with mild presacral edema as well.).  Neurological: He is alert and oriented to person, place, and time. A cranial nerve deficit is present.  Skin: Skin is warm and dry. No rash noted.  Bilateral venous stasis changes, there is a dressing in place at the site of an ulcer on the left upper shin.  Psychiatric: He has a normal mood and affect. His behavior is normal. Judgment and thought content normal.  Vitals reviewed.    Adult ECG Report  Rate: 117 ;  Rhythm: atrial fibrillation and Rapid ventricular response.  QRS Axis: -52 = LAFB ;  PR Interval: * ;  QRS Duration: 118 ; QTc: 560 - likely inaccurate with A.  Fib;  Voltages: borderline low in the precordial leads  Conduction Disturbances: Incomplete right bundle-branch block.; Other Abnormalities: Nonspecific ST changes.   Narrative Interpretation: A. Fib with RVR and left anterior fascicular block along with incomplete right block with nonspecific ST and T wave changes. Relatively stable from previous EKGs but the heart rate is higher.  Recent Labs:   Chemistry      Component Value Date/Time   NA 135 09/05/2014 0555   K 4.8 09/05/2014 0555   CL 101 09/05/2014 0555   CO2 25 09/05/2014 0555   BUN 9 09/05/2014 0555   CREATININE 0.95 09/05/2014 0555      Component Value Date/Time   CALCIUM 8.0* 09/05/2014 0555   ALKPHOS 145* 09/05/2014 0555   AST 50* 09/05/2014 0555   ALT 25 09/05/2014 0555   BILITOT 1.5* 09/05/2014 0555     Lab Results  Component Value Date   CHOL 131 08/14/2013   HDL 55.40 08/14/2013   LDLCALC 67 08/14/2013   TRIG 43.0 08/14/2013   CHOLHDL 2 08/14/2013   Lab Results  Component Value Date   WBC 12.5* 09/05/2014   HGB 8.7* 09/05/2014   HCT 24.3* 09/05/2014   MCV 101.3* 09/05/2014   PLT 257 09/05/2014   Lab Results  Component Value Date   LABPROT 15.7* 09/02/2014    ASSESSMENT / PLAN: Chronically ill elderly gentleman with protein malnutrition and long-standing alcohol use history who developed A. Fib RVR as part of a complication from a very complex UTI hospitalization with sepsis. He apparently was in sinus rhythm we saw his primary physician on February 29, but is clearly back in A. Fib with RVR here today. He has extensive bilateral lower extremity edema and exertional dyspnea. He was felt to be a poor inhalation candidate because of his medication, alcohol history and unsteady gait. However I think if he is due to be wheelchair-bound for the most part, he may be better off on anticoagulation. I will defer that to his PCP for now. I do think he should be at least aspirin.  He will need a stress test to  evaluate for any potential ischemic etiology for his atrial fibrillation. EF is relatively well-preserved with only Moderately increased pulmonary pressures.  I would imagine the majority of his edema is related to excision and venous stasis as opposed to a right-sided heart failure. Certainly with  elevated pulmonary pressures there is a right-sided component but likely not all that significant based on the only mild amount of right atrial enlargement.  Atrial fibrillation with RVR Inadequate rate control. He probably will need more than just one agent for rate control however for now I will increase his diltiazem to 240 mg daily. We will see him back shortly after his stress test and potentially either increase the diltiazem further or use beta blocker plus or minus digoxin.  He likely is not a good endocrinology candidate for now to see how stable he will be. Probably does not have the best of coagulation factors from his alcohol-related likely liver disease. However his CHA2DS2Vasc Score is ~2 and not 3 (Age & HTN only, he has normal LV function, is not diabetic, no diagnosed vascular disease or stroke, male)  Plan: Increase diltiazem 240 mg daily, start aspirin 81 mg daily for stroke prophylaxis.  As this is a new diagnosis of atrial fibrillation, Lexiscan Myoview to evaluate for ischemic etiology.   Essential hypertension bborderline control. This does be plenty of room to use calcium channel blockers and beta blockers for rate control.   Exertional dyspnea Probably related to A. Fib RVR and some diastolic dysfunction. He clearly has deconditioning. He probably does not tolerate being RVR. That'll explain episodes of exertional dyspnea but not persistent exertional dyspnea.  Plan: Lexiscan Myoview to evaluate for ischemic etiology.  Increase Lasix to 40 mg daily   Bilateral lower extremity edema He has chronic edema which is probably combination of venous stasis, edema and protein  malnutrition. Increasing Lasix to 40 mg daily. Recommend support hose/compression stockings   Dietary supplementation as indicated by PCP    Dyslipidemia Last lipid panel was pretty much at goal see above. Labs reviewed     Orders Placed This Encounter  Procedures  . Myocardial Perfusion Imaging    Standing Status: Future     Number of Occurrences:      Standing Expiration Date: 09/26/2015    Order Specific Question:  Where should this test be performed    Answer:  [facility]    Order Specific Question:  Type of stress    Answer:  Lexiscan    Order Specific Question:  Patient weight in lbs    Answer:  193  . EKG 12-Lead   Meds ordered this encounter  Medications  . aspirin EC 81 MG tablet    Sig: Take 1 tablet (81 mg total) by mouth daily.    Dispense:  90 tablet    Refill:  3  . diltiazem (CARDIZEM CD) 240 MG 24 hr capsule    Sig: Take 1 capsule (240 mg total) by mouth daily.    Dispense:  30 capsule    Refill:  11  . furosemide (LASIX) 40 MG tablet    Sig: Take 1 tablet (40 mg total) by mouth daily.    Dispense:  30 tablet    Refill:  11   Complex in the area with clinic notes and discharge summaries as well as H&P is reviewed in Epic.I personally reviewed the echocardiogram report and entered the data in the past medical history. Total time spent with chart abstraction was at least 30 minutes in addition to the 20 minutes with the patient.  Followup: 4-6 weeks  DAVID W. Ellyn Hack, M.D., M.S. Interventional Cardiolgy [facility]

## 2014-09-28 ENCOUNTER — Encounter (HOSPITAL_COMMUNITY): Payer: Self-pay | Admitting: *Deleted

## 2014-09-28 DIAGNOSIS — F1020 Alcohol dependence, uncomplicated: Secondary | ICD-10-CM | POA: Diagnosis not present

## 2014-09-28 DIAGNOSIS — M6281 Muscle weakness (generalized): Secondary | ICD-10-CM | POA: Diagnosis not present

## 2014-09-28 DIAGNOSIS — L03115 Cellulitis of right lower limb: Secondary | ICD-10-CM | POA: Diagnosis not present

## 2014-09-28 DIAGNOSIS — K219 Gastro-esophageal reflux disease without esophagitis: Secondary | ICD-10-CM | POA: Diagnosis not present

## 2014-09-28 DIAGNOSIS — I4891 Unspecified atrial fibrillation: Secondary | ICD-10-CM | POA: Diagnosis not present

## 2014-09-28 DIAGNOSIS — L03116 Cellulitis of left lower limb: Secondary | ICD-10-CM | POA: Diagnosis not present

## 2014-09-28 DIAGNOSIS — I10 Essential (primary) hypertension: Secondary | ICD-10-CM | POA: Diagnosis not present

## 2014-09-28 DIAGNOSIS — K7030 Alcoholic cirrhosis of liver without ascites: Secondary | ICD-10-CM | POA: Diagnosis not present

## 2014-10-01 DIAGNOSIS — F1020 Alcohol dependence, uncomplicated: Secondary | ICD-10-CM | POA: Diagnosis not present

## 2014-10-01 DIAGNOSIS — I10 Essential (primary) hypertension: Secondary | ICD-10-CM | POA: Diagnosis not present

## 2014-10-01 DIAGNOSIS — M6281 Muscle weakness (generalized): Secondary | ICD-10-CM | POA: Diagnosis not present

## 2014-10-01 DIAGNOSIS — L03116 Cellulitis of left lower limb: Secondary | ICD-10-CM | POA: Diagnosis not present

## 2014-10-01 DIAGNOSIS — L03115 Cellulitis of right lower limb: Secondary | ICD-10-CM | POA: Diagnosis not present

## 2014-10-01 DIAGNOSIS — K219 Gastro-esophageal reflux disease without esophagitis: Secondary | ICD-10-CM | POA: Diagnosis not present

## 2014-10-01 DIAGNOSIS — K7030 Alcoholic cirrhosis of liver without ascites: Secondary | ICD-10-CM | POA: Diagnosis not present

## 2014-10-01 DIAGNOSIS — I4891 Unspecified atrial fibrillation: Secondary | ICD-10-CM | POA: Diagnosis not present

## 2014-10-02 DIAGNOSIS — L03115 Cellulitis of right lower limb: Secondary | ICD-10-CM | POA: Diagnosis not present

## 2014-10-02 DIAGNOSIS — L03116 Cellulitis of left lower limb: Secondary | ICD-10-CM | POA: Diagnosis not present

## 2014-10-02 DIAGNOSIS — M6281 Muscle weakness (generalized): Secondary | ICD-10-CM | POA: Diagnosis not present

## 2014-10-02 DIAGNOSIS — K7030 Alcoholic cirrhosis of liver without ascites: Secondary | ICD-10-CM | POA: Diagnosis not present

## 2014-10-02 DIAGNOSIS — I10 Essential (primary) hypertension: Secondary | ICD-10-CM | POA: Diagnosis not present

## 2014-10-02 DIAGNOSIS — F1020 Alcohol dependence, uncomplicated: Secondary | ICD-10-CM | POA: Diagnosis not present

## 2014-10-02 DIAGNOSIS — I4891 Unspecified atrial fibrillation: Secondary | ICD-10-CM | POA: Diagnosis not present

## 2014-10-02 DIAGNOSIS — K219 Gastro-esophageal reflux disease without esophagitis: Secondary | ICD-10-CM | POA: Diagnosis not present

## 2014-10-03 DIAGNOSIS — I10 Essential (primary) hypertension: Secondary | ICD-10-CM | POA: Diagnosis not present

## 2014-10-03 DIAGNOSIS — F1020 Alcohol dependence, uncomplicated: Secondary | ICD-10-CM | POA: Diagnosis not present

## 2014-10-03 DIAGNOSIS — K219 Gastro-esophageal reflux disease without esophagitis: Secondary | ICD-10-CM | POA: Diagnosis not present

## 2014-10-03 DIAGNOSIS — M6281 Muscle weakness (generalized): Secondary | ICD-10-CM | POA: Diagnosis not present

## 2014-10-03 DIAGNOSIS — I4891 Unspecified atrial fibrillation: Secondary | ICD-10-CM | POA: Diagnosis not present

## 2014-10-03 DIAGNOSIS — L03115 Cellulitis of right lower limb: Secondary | ICD-10-CM | POA: Diagnosis not present

## 2014-10-03 DIAGNOSIS — K7030 Alcoholic cirrhosis of liver without ascites: Secondary | ICD-10-CM | POA: Diagnosis not present

## 2014-10-03 DIAGNOSIS — L03116 Cellulitis of left lower limb: Secondary | ICD-10-CM | POA: Diagnosis not present

## 2014-10-04 DIAGNOSIS — I10 Essential (primary) hypertension: Secondary | ICD-10-CM | POA: Diagnosis not present

## 2014-10-04 DIAGNOSIS — L03115 Cellulitis of right lower limb: Secondary | ICD-10-CM | POA: Diagnosis not present

## 2014-10-04 DIAGNOSIS — M6281 Muscle weakness (generalized): Secondary | ICD-10-CM | POA: Diagnosis not present

## 2014-10-04 DIAGNOSIS — F1020 Alcohol dependence, uncomplicated: Secondary | ICD-10-CM | POA: Diagnosis not present

## 2014-10-04 DIAGNOSIS — I4891 Unspecified atrial fibrillation: Secondary | ICD-10-CM | POA: Diagnosis not present

## 2014-10-04 DIAGNOSIS — K7030 Alcoholic cirrhosis of liver without ascites: Secondary | ICD-10-CM | POA: Diagnosis not present

## 2014-10-04 DIAGNOSIS — K219 Gastro-esophageal reflux disease without esophagitis: Secondary | ICD-10-CM | POA: Diagnosis not present

## 2014-10-04 DIAGNOSIS — L03116 Cellulitis of left lower limb: Secondary | ICD-10-CM | POA: Diagnosis not present

## 2014-10-08 ENCOUNTER — Other Ambulatory Visit: Payer: Self-pay | Admitting: *Deleted

## 2014-10-08 ENCOUNTER — Encounter: Payer: Self-pay | Admitting: *Deleted

## 2014-10-08 DIAGNOSIS — I10 Essential (primary) hypertension: Secondary | ICD-10-CM | POA: Diagnosis not present

## 2014-10-08 DIAGNOSIS — K219 Gastro-esophageal reflux disease without esophagitis: Secondary | ICD-10-CM | POA: Diagnosis not present

## 2014-10-08 DIAGNOSIS — F1020 Alcohol dependence, uncomplicated: Secondary | ICD-10-CM | POA: Diagnosis not present

## 2014-10-08 DIAGNOSIS — K7030 Alcoholic cirrhosis of liver without ascites: Secondary | ICD-10-CM | POA: Diagnosis not present

## 2014-10-08 DIAGNOSIS — I4891 Unspecified atrial fibrillation: Secondary | ICD-10-CM | POA: Diagnosis not present

## 2014-10-08 DIAGNOSIS — L03116 Cellulitis of left lower limb: Secondary | ICD-10-CM | POA: Diagnosis not present

## 2014-10-08 DIAGNOSIS — L03115 Cellulitis of right lower limb: Secondary | ICD-10-CM | POA: Diagnosis not present

## 2014-10-08 DIAGNOSIS — M6281 Muscle weakness (generalized): Secondary | ICD-10-CM | POA: Diagnosis not present

## 2014-10-08 NOTE — Patient Outreach (Signed)
   10/08/2014   Bob Wilson 1933/02/19 [id number]   Phone call:   Spoke with pt to f/u on call  Made by spouse yesterday to [place] call center reporting pt had diarrhea, did not take medicines for 2 days, pt thinks medicines  are causing  Diarrhea.  Pt states he is a lot better, no diarrhea today, took medicines  this morning.   Pt states he drinks 3-4 glasses of water a day.    Zara Chess.   [facility]  [phone]

## 2014-10-09 ENCOUNTER — Telehealth (HOSPITAL_COMMUNITY): Payer: Self-pay

## 2014-10-09 DIAGNOSIS — I10 Essential (primary) hypertension: Secondary | ICD-10-CM | POA: Diagnosis not present

## 2014-10-09 DIAGNOSIS — M6281 Muscle weakness (generalized): Secondary | ICD-10-CM | POA: Diagnosis not present

## 2014-10-09 DIAGNOSIS — I4891 Unspecified atrial fibrillation: Secondary | ICD-10-CM | POA: Diagnosis not present

## 2014-10-09 DIAGNOSIS — L03116 Cellulitis of left lower limb: Secondary | ICD-10-CM | POA: Diagnosis not present

## 2014-10-09 DIAGNOSIS — K7030 Alcoholic cirrhosis of liver without ascites: Secondary | ICD-10-CM | POA: Diagnosis not present

## 2014-10-09 DIAGNOSIS — F1020 Alcohol dependence, uncomplicated: Secondary | ICD-10-CM | POA: Diagnosis not present

## 2014-10-09 DIAGNOSIS — K219 Gastro-esophageal reflux disease without esophagitis: Secondary | ICD-10-CM | POA: Diagnosis not present

## 2014-10-09 DIAGNOSIS — L03115 Cellulitis of right lower limb: Secondary | ICD-10-CM | POA: Diagnosis not present

## 2014-10-09 NOTE — Telephone Encounter (Signed)
Encounter complete. 

## 2014-10-10 NOTE — Progress Notes (Signed)
This encounter was created in error - please disregard.

## 2014-10-11 ENCOUNTER — Ambulatory Visit (HOSPITAL_COMMUNITY)
Admission: RE | Admit: 2014-10-11 | Discharge: 2014-10-11 | Disposition: A | Discharge status: Home / Self Care | Payer: Medicare Other | Source: Ambulatory Visit | Attending: Cardiology | Admitting: Cardiology

## 2014-10-11 DIAGNOSIS — I4891 Unspecified atrial fibrillation: Secondary | ICD-10-CM | POA: Diagnosis not present

## 2014-10-11 DIAGNOSIS — I10 Essential (primary) hypertension: Secondary | ICD-10-CM | POA: Diagnosis not present

## 2014-10-11 DIAGNOSIS — E785 Hyperlipidemia, unspecified: Secondary | ICD-10-CM | POA: Insufficient documentation

## 2014-10-11 MED ORDER — TECHNETIUM TC 99M SESTAMIBI GENERIC - CARDIOLITE
30.9000 | Freq: Once | INTRAVENOUS | Status: AC | PRN
  Administered 2014-10-11: 30.9 via INTRAVENOUS

## 2014-10-11 MED ORDER — TECHNETIUM TC 99M SESTAMIBI GENERIC - CARDIOLITE
10.3000 | Freq: Once | INTRAVENOUS | Status: AC | PRN
  Administered 2014-10-11: 10 via INTRAVENOUS

## 2014-10-11 MED ORDER — REGADENOSON 0.4 MG/5ML IV SOLN
0.4000 mg | Freq: Once | INTRAVENOUS | Status: AC
  Administered 2014-10-11: 0.4 mg via INTRAVENOUS

## 2014-10-11 NOTE — Procedures (Addendum)
[facility] [address] [place] [address] [phone]  Cardiology Nuclear Med Study  Bob Wilson is a 81 y.o. male     MRN : [id number]     DOB: 07-12-1933  Procedure Date: 10/11/2014  Nuclear Med Background Indication for Stress Test:  Evaluation for Ischemia, [facility] and Abnormal EKG History:  AFIB;s/p VATS;No prior respiratory history reported;No prior NUC MPI for comparison Cardiac Risk Factors: Family History - CAD, History of Smoking, Hypertension, Lipids and NLZ:[id number];TKWI ABUSE  Symptoms:  DOE and Fatigue   Nuclear Pre-Procedure Caffeine/Decaff Intake:  9:30pm NPO After: 5:30am   IV Site: R Forearm  IV 0.9% NS with Angio Cath:  22g  Chest Size (in):  42" IV Started by: Rolene Course, RN  Height: 6\' 3"  (1.905 m)  Cup Size: n/a  BMI:  Body mass index is 24.12 kg/(m^2). Weight:  193 lb (87.544 kg)   Tech Comments:  n/a    Nuclear Med Study 1 or 2 day study: 1 day  Stress Test Type:  [place]  Order Authorizing Provider:  Glenetta Hew, MD   Resting Radionuclide: Technetium 50m Sestamibi  Resting Radionuclide Dose: 10.3 mCi   Stress Radionuclide:  Technetium 39m Sestamibi  Stress Radionuclide Dose: 30.9 mCi           Stress Protocol Rest HR: 118 Stress HR: 121  Rest BP: 155/105 Stress BP: 146/97  Exercise Time (min): n/a METS: n/a          Dose of Adenosine (mg):  n/a Dose of Lexiscan: 0.4 mg  Dose of Atropine (mg): n/a Dose of Dobutamine: n/a mcg/kg/min (at max HR)  Stress Test Technologist: Leane Para, CCT Nuclear Technologist: Imagene Riches, CNMT   Rest Procedure:  Myocardial perfusion imaging was performed at rest 45 minutes following the intravenous administration of Technetium 12m Sestamibi. Stress Procedure:  The patient received IV Lexiscan 0.4 mg over 15-seconds.  Technetium 64m Sestamibi injected IV at 30-seconds.  There were no significant changes with Lexiscan.   Quantitative spect images were obtained after a 45 minute delay.  Transient Ischemic Dilatation (Normal <1.22):  1.23 QGS EDV:  n/a ml QGS ESV:  n/a ml LV Ejection Fraction: Study not gated       Rest ECG: NSR-RBBB and Atrial Fibrilliation  Stress ECG: No significant change from baseline ECG  QPS Raw Data Images:  Normal; no motion artifact; normal heart/lung ratio. Stress Images:  There is decreased uptake in the inferior wall. Rest Images:  There is decreased uptake in the inferior wall. Subtraction (SDS):  There is a fixed inferior defect that is most consistent with diaphragmatic attenuation.  Impression Exercise Capacity:  Lexiscan with no exercise. BP Response:  Normal blood pressure response. Clinical Symptoms:  No significant symptoms noted. ECG Impression:  No significant ST segment change suggestive of ischemia. Comparison with Prior Nuclear Study: No previous nuclear study performed  Overall Impression:  Low risk stress nuclear study Diaphragmatic attenuation.  LV Wall Motion:  Not gated secondary to CAF   Lorretta Harp, MD  10/12/2014 8:51 AM

## 2014-10-12 HISTORY — PX: NM MYOVIEW LTD: HXRAD82

## 2014-10-15 ENCOUNTER — Telehealth: Payer: Self-pay | Admitting: *Deleted

## 2014-10-15 NOTE — Telephone Encounter (Signed)
-----   Message from Leonie Man, MD sent at 10/12/2014 12:57 PM EDT ----- Low Risk study - diaphragmati  Attenuation  (a common artifact).  Good news.  [place]

## 2014-10-15 NOTE — Telephone Encounter (Signed)
Spoke to patient. Result given . Verbalized understanding  

## 2014-10-24 DIAGNOSIS — I10 Essential (primary) hypertension: Secondary | ICD-10-CM | POA: Diagnosis not present

## 2014-10-24 DIAGNOSIS — F1020 Alcohol dependence, uncomplicated: Secondary | ICD-10-CM | POA: Diagnosis not present

## 2014-10-24 DIAGNOSIS — L03115 Cellulitis of right lower limb: Secondary | ICD-10-CM | POA: Diagnosis not present

## 2014-10-24 DIAGNOSIS — I4891 Unspecified atrial fibrillation: Secondary | ICD-10-CM | POA: Diagnosis not present

## 2014-10-24 DIAGNOSIS — M6281 Muscle weakness (generalized): Secondary | ICD-10-CM | POA: Diagnosis not present

## 2014-10-24 DIAGNOSIS — K7030 Alcoholic cirrhosis of liver without ascites: Secondary | ICD-10-CM | POA: Diagnosis not present

## 2014-10-24 DIAGNOSIS — K219 Gastro-esophageal reflux disease without esophagitis: Secondary | ICD-10-CM | POA: Diagnosis not present

## 2014-10-24 DIAGNOSIS — L03116 Cellulitis of left lower limb: Secondary | ICD-10-CM | POA: Diagnosis not present

## 2014-10-26 DIAGNOSIS — K219 Gastro-esophageal reflux disease without esophagitis: Secondary | ICD-10-CM | POA: Diagnosis not present

## 2014-10-26 DIAGNOSIS — L03116 Cellulitis of left lower limb: Secondary | ICD-10-CM | POA: Diagnosis not present

## 2014-10-26 DIAGNOSIS — I10 Essential (primary) hypertension: Secondary | ICD-10-CM | POA: Diagnosis not present

## 2014-10-26 DIAGNOSIS — K7030 Alcoholic cirrhosis of liver without ascites: Secondary | ICD-10-CM | POA: Diagnosis not present

## 2014-10-26 DIAGNOSIS — L03115 Cellulitis of right lower limb: Secondary | ICD-10-CM | POA: Diagnosis not present

## 2014-10-26 DIAGNOSIS — M6281 Muscle weakness (generalized): Secondary | ICD-10-CM | POA: Diagnosis not present

## 2014-10-26 DIAGNOSIS — I4891 Unspecified atrial fibrillation: Secondary | ICD-10-CM | POA: Diagnosis not present

## 2014-10-26 DIAGNOSIS — F1020 Alcohol dependence, uncomplicated: Secondary | ICD-10-CM | POA: Diagnosis not present

## 2014-11-01 DIAGNOSIS — I10 Essential (primary) hypertension: Secondary | ICD-10-CM | POA: Diagnosis not present

## 2014-11-01 DIAGNOSIS — K7030 Alcoholic cirrhosis of liver without ascites: Secondary | ICD-10-CM | POA: Diagnosis not present

## 2014-11-01 DIAGNOSIS — F1020 Alcohol dependence, uncomplicated: Secondary | ICD-10-CM | POA: Diagnosis not present

## 2014-11-01 DIAGNOSIS — L03115 Cellulitis of right lower limb: Secondary | ICD-10-CM | POA: Diagnosis not present

## 2014-11-01 DIAGNOSIS — R338 Other retention of urine: Secondary | ICD-10-CM | POA: Diagnosis not present

## 2014-11-01 DIAGNOSIS — I4891 Unspecified atrial fibrillation: Secondary | ICD-10-CM | POA: Diagnosis not present

## 2014-11-01 DIAGNOSIS — N390 Urinary tract infection, site not specified: Secondary | ICD-10-CM | POA: Diagnosis not present

## 2014-11-01 DIAGNOSIS — L03116 Cellulitis of left lower limb: Secondary | ICD-10-CM | POA: Diagnosis not present

## 2014-11-01 DIAGNOSIS — K219 Gastro-esophageal reflux disease without esophagitis: Secondary | ICD-10-CM | POA: Diagnosis not present

## 2014-11-01 DIAGNOSIS — M6281 Muscle weakness (generalized): Secondary | ICD-10-CM | POA: Diagnosis not present

## 2014-11-02 DIAGNOSIS — M6281 Muscle weakness (generalized): Secondary | ICD-10-CM | POA: Diagnosis not present

## 2014-11-05 ENCOUNTER — Encounter: Payer: Self-pay | Admitting: Cardiology

## 2014-11-05 ENCOUNTER — Ambulatory Visit (INDEPENDENT_AMBULATORY_CARE_PROVIDER_SITE_OTHER): Payer: Medicare Other | Admitting: Cardiology

## 2014-11-05 ENCOUNTER — Telehealth: Payer: Self-pay | Admitting: Family Medicine

## 2014-11-05 VITALS — BP 148/72 | HR 96 | Ht 75.0 in | Wt 186.8 lb

## 2014-11-05 DIAGNOSIS — I481 Persistent atrial fibrillation: Secondary | ICD-10-CM | POA: Diagnosis not present

## 2014-11-05 DIAGNOSIS — I10 Essential (primary) hypertension: Secondary | ICD-10-CM

## 2014-11-05 DIAGNOSIS — R600 Localized edema: Secondary | ICD-10-CM | POA: Diagnosis not present

## 2014-11-05 DIAGNOSIS — I4819 Other persistent atrial fibrillation: Secondary | ICD-10-CM

## 2014-11-05 DIAGNOSIS — R0609 Other forms of dyspnea: Secondary | ICD-10-CM

## 2014-11-05 DIAGNOSIS — L03115 Cellulitis of right lower limb: Secondary | ICD-10-CM | POA: Diagnosis not present

## 2014-11-05 DIAGNOSIS — I4891 Unspecified atrial fibrillation: Secondary | ICD-10-CM | POA: Diagnosis not present

## 2014-11-05 DIAGNOSIS — F1020 Alcohol dependence, uncomplicated: Secondary | ICD-10-CM | POA: Diagnosis not present

## 2014-11-05 DIAGNOSIS — K219 Gastro-esophageal reflux disease without esophagitis: Secondary | ICD-10-CM | POA: Diagnosis not present

## 2014-11-05 DIAGNOSIS — K7030 Alcoholic cirrhosis of liver without ascites: Secondary | ICD-10-CM | POA: Diagnosis not present

## 2014-11-05 DIAGNOSIS — L03116 Cellulitis of left lower limb: Secondary | ICD-10-CM | POA: Diagnosis not present

## 2014-11-05 DIAGNOSIS — M6281 Muscle weakness (generalized): Secondary | ICD-10-CM | POA: Diagnosis not present

## 2014-11-05 NOTE — Patient Instructions (Signed)
No changes with medications.  Your physician wants you to follow-up in 6 months DR HARDING.  You will receive a reminder letter in the mail two months in advance. If you don't receive a letter, please call our office to schedule the follow-up appointment.

## 2014-11-05 NOTE — Assessment & Plan Note (Signed)
Rate better controlled with increased CCB.   He is reluctant to add or adjust medications for now.   I do think that some of his DOE is related to inadequate HR control -- if this gets worse, will need to either increase CCB (relutctant with edema) or add BB (he doesn't want any meds). ASA for CVA prophylaxis.

## 2014-11-05 NOTE — Telephone Encounter (Signed)
[name] called needing a verbal order to recert him for another 60 days.  Best number to call is [phone] for questions and concerns.

## 2014-11-05 NOTE — Progress Notes (Signed)
PCP: Owens Loffler, MD  Clinic Note: Chief Complaint  Patient presents with  . Follow-up    NO CHEST PIAN , NO SOB , NO SWELLING , FEET HURTS    HPI: Bob Wilson is a 81 y.o. male with a PMH below who presents today for ~3 MONTHS follow-up for PF & edema. He has han an Echo & Myoview since last visit.Marland Kitchen  Past Medical History  Diagnosis Date  . Anemia     NOS  . Arthritis   . Gout   . Hyperlipidemia   . Hypertension   . GERD (gastroesophageal reflux disease)   . Elevated PSA     multiple times, refused urology eval [facility] notes)  . ETOH abuse   . Medically noncompliant     noncompliant with follow ups, labs.  . Alcoholism 06/25/2011  . Cirrhosis, alcoholic 10/26/8117  . 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 Paroxysmal atrial fibrillation   . Bilateral lower extremity edema     Chronic.  Marland Kitchen History of empyema of pleura 12/2011    s/p VATS decortication    Prior Cardiac Evaluation and Past Surgical History: Past Surgical History  Procedure Laterality Date  . Lipoma excision    . Transthoracic echocardiogram  09/03/2014    Normal overall LV size. EF 55-60% with no regional W. MA. Trivial AI. Mild-moderate MR. Mild R. and LA dilation. Mild to moderate TR. PA pressures ~47 mmHg  . Video assisted thoracoscopy (vats)/empyema  12/2011  . Nm myoview ltd  10/12/14    LOW RISK - NO ISCHEMIA, DIAPHRAGMATIC ATTENUATION, NON-GATED    Interval History: He says - "I couldn't be better".  Still remains weak from recent hospitalization - legs & feet give out on him.  Still gets a bit sob with exertion - but doing better each week - now able to walk back & forth in his house (~50 ft) 4-5 times.  Cardiovascular ROS: positive for - dyspnea on exertion and edema negative for - chest pain, irregular heartbeat, loss of consciousness, murmur, orthopnea, palpitations, paroxysmal nocturnal dyspnea, rapid heart rate or shortness of breath, syncope/near syncope,TIA/amaurosis fugax  symptoms.   ROS: A comprehensive was performed. Review of Systems  Constitutional: Positive for malaise/fatigue (getting stronger each day.).  Cardiovascular: Positive for leg swelling. Negative for orthopnea and PND.  Gastrointestinal: Negative for blood in stool and melena.  Genitourinary: Negative for hematuria.       Indwelling foley  Musculoskeletal: Positive for joint pain (feet & ankles).       Legs weak  Neurological: Positive for dizziness (occasional).  Psychiatric/Behavioral: Negative for depression.  All other systems reviewed and are negative.   Current Outpatient Prescriptions on File Prior to Visit  Medication Sig Dispense Refill  . aspirin EC 81 MG tablet Take 1 tablet (81 mg total) by mouth daily. 90 tablet 3  . colchicine 0.6 MG tablet Take 0.6 mg by mouth 2 (two) times daily as needed (gout).    Marland Kitchen diltiazem (CARDIZEM CD) 240 MG 24 hr capsule Take 1 capsule (240 mg total) by mouth daily. 30 capsule 11  . ENSURE (ENSURE) Take 118.5 mLs by mouth daily.     . folic acid (FOLVITE) 1 MG tablet Take 1 tablet (1 mg total) by mouth daily. 30 tablet 0  . furosemide (LASIX) 40 MG tablet Take 1 tablet (40 mg total) by mouth daily. 30 tablet 11  . Multiple Vitamin (MULTIVITAMIN WITH MINERALS) TABS tablet Take 1 tablet by mouth daily.  30 tablet 0   No current facility-administered medications on file prior to visit.   Allergies  Allergen Reactions  . Ace Inhibitors Other (See Comments)    REACTION: Angioedema  . Aspirin Other (See Comments)    Nose bleeds   . Lisinopril Swelling and Other (See Comments)    REACTION: Facial Swelling  . Rocephin [Ceftriaxone Sodium In Dextrose] Hives    Previously tolerated PCN and Ceph 2/14 Hives with ceftriaxone dose 2/15 No reaction with benadryl/ceftriaxone 2/16 Tolerating Zosyn w/o reaction    History  Substance Use Topics  . Smoking status: Former Smoker    Types: Cigarettes    Quit date: 07/20/1974  . Smokeless tobacco:  Never Used     Comment: Remote- quit 45 years ago.  . Alcohol Use: 0.0 oz/week    0 Standard drinks or equivalent per week     Comment: states that he drinks "couple pints of gin" every day.  nnow has only had maybe one shot of June in the last few weeks.   No family history on file.   Wt Readings from Last 3 Encounters:  11/05/14 186 lb 12.8 oz (84.732 kg)  10/11/14 193 lb (87.544 kg)  09/26/14 193 lb 11.2 oz (87.862 kg)    PHYSICAL EXAM BP 148/72 mmHg  Pulse 96  Ht 6\' 3"  (1.905 m)  Wt 186 lb 12.8 oz (84.732 kg)  BMI 23.35 kg/m2 General appearance: alert, cooperative, appears stated age, no distress; well nourished, well groomed HEENT: [place]/AT, EOMI, MMM, anicteric sclera; arucs senilus Neck: no adenopathy, no carotid bruit and no JVD Lungs: clear to auscultation bilaterally, normal percussion bilaterally and non-labored Heart: An irregularly irregular rhythm present. Tachycardia present. PMI is displaced. Exam reveals decreased pulses (very difficult to palpate pedal pulses due to the edema). Exam reveals no gallop, no friction rub, no midsystolic click and no opening snap. High-pitched blowing holosystolic murmur is present with a grade of 2/6 at the apex Abdomen: soft, non-tender; bowel sounds normal; no masses,  no organomegaly; no HJR Extremities: extremities normal, atraumatic, no cyanosis, and edema ~1-2+ tense. Pulses: 2+ and symmetric; mildly diminished in BLE Skin:  Dry scaly skin on BLE Neurologic: Mental status: Alert, oriented, thought content appropriate Psych: Pleasant mood & affect - a bit gouchy - wants to "coast" for a while    Adult ECG Report - not checked  Recent Labs:  No new labs   Chemistry      Component Value Date/Time   NA 135 09/05/2014 0555   K 4.8 09/05/2014 0555   CL 101 09/05/2014 0555   CO2 25 09/05/2014 0555   BUN 9 09/05/2014 0555   CREATININE 0.95 09/05/2014 0555      Component Value Date/Time   CALCIUM 8.0* 09/05/2014 0555    ALKPHOS 145* 09/05/2014 0555   AST 50* 09/05/2014 0555   ALT 25 09/05/2014 0555   BILITOT 1.5* 09/05/2014 0555       ASSESSMENT / PLAN: Problem List Items Addressed This Visit    Bilateral lower extremity edema (Chronic)    Notably improved.  Echo does not suggest Primary Cardiac etiology - venous stasis + protein malnutrition. Again - recommended support hose / compression stockings. Reduce salt intake.      Essential hypertension (Chronic)    Borderline control - Mild permissive HTN is OK -- still has room for more HR control.      Exertional dyspnea    Relativley normal Echo & negative Myoview. Multifactorial - Afib with  inadequate VR control, deconditioning.      Persistent atrial fibrillation - Primary (Chronic)    Rate better controlled with increased CCB.   He is reluctant to add or adjust medications for now.   I do think that some of his DOE is related to inadequate HR control -- if this gets worse, will need to either increase CCB (relutctant with edema) or add BB (he doesn't want any meds). ASA for CVA prophylaxis.         No orders of the defined types were placed in this encounter.   No orders of the defined types were placed in this encounter.     Followup: 6 months - then perhaps annual if stable.    Leonie Man, M.D., M.S. Interventional Cardiologist   Pager # [phone]

## 2014-11-05 NOTE — Assessment & Plan Note (Signed)
Borderline control - Mild permissive HTN is OK -- still has room for more HR control.

## 2014-11-05 NOTE — Assessment & Plan Note (Signed)
Notably improved.  Echo does not suggest Primary Cardiac etiology - venous stasis + protein malnutrition. Again - recommended support hose / compression stockings. Reduce salt intake.

## 2014-11-05 NOTE — Assessment & Plan Note (Signed)
Relativley normal Echo & negative Myoview. Multifactorial - Afib with inadequate VR control, deconditioning.

## 2014-11-05 NOTE — Telephone Encounter (Signed)
Verbal order given to [place] with [facility] to recert Bob Wilson for another 60 days.

## 2014-11-06 ENCOUNTER — Other Ambulatory Visit: Payer: Self-pay | Admitting: *Deleted

## 2014-11-06 VITALS — BP 126/80 | HR 73 | Resp 20 | Ht 75.0 in | Wt 189.0 lb

## 2014-11-06 DIAGNOSIS — Z7409 Other reduced mobility: Secondary | ICD-10-CM

## 2014-11-07 NOTE — Patient Outreach (Signed)
[facility]) Care Management  [place]  11/07/2014   Bob Wilson 05-Aug-1932 [id number]  Subjective: Pt reports had urodynamic test, to f/u with Dr. Montine Circle this week.  Pt states he went to  Heart [facility] yesterday, stress test came back good.  Pt states he fell 2 days ago, left leg gave way, no  Injury just bump on head.   Pt states he is doing exercises  HHPT gave him, walking up and down his Hallway 2-3 times a day.  Pt states appetite is better, getting out more.  Pt states sores on left leg  Better, HH RN still coming.   Pt states he was told to get support stockings, wife to get them.   Objective:  ROS-  Trace edema to left foot/ankle, +1 to right foot/ankle.             Urinary catheter- urine clear.   Filed Vitals:   11/06/14 1425  BP: 126/80  Pulse: 73  Resp: 20    Current Medications:  Current Outpatient Prescriptions  Medication Sig Dispense Refill  . aspirin EC 81 MG tablet Take 1 tablet (81 mg total) by mouth daily. 90 tablet 3  . colchicine 0.6 MG tablet Take 0.6 mg by mouth 2 (two) times daily as needed (gout).    Marland Kitchen diltiazem (CARDIZEM CD) 240 MG 24 hr capsule Take 1 capsule (240 mg total) by mouth daily. 30 capsule 11  . furosemide (LASIX) 40 MG tablet Take 1 tablet (40 mg total) by mouth daily. 30 tablet 11  . Multiple Vitamin (MULTIVITAMIN WITH MINERALS) TABS tablet Take 1 tablet by mouth daily. 30 tablet 0  . ENSURE (ENSURE) Take 118.5 mLs by mouth daily.     . folic acid (FOLVITE) 1 MG tablet Take 1 tablet (1 mg total) by mouth daily. 30 tablet 0   No current facility-administered medications for this visit.     Assessment:  HTN- BP today 126/80                         Nutrition: appetite improved                         Mobility:  Ambulated with cane, pt reports improved                         Urinary status: continues with catheter.                          Plan:   Discharge pt from RN CM services, goals met.   [facility] CM Care  Plan Problem One        Patient Outreach from 11/06/2014 in [place] Problem One  Impaired mobility related to neuropathy in legs    Care Plan for Problem One  Active   [facility] Long Term Goal Start Date  08/03/14   [facility] Long Term Goal Met Date  11/06/14 [pt reports walking daily 48 feet ]   [facility] CM Short Term Goal #1 (0-30 days)  -- Vonzell Schlatter - documentation in legacy chart ]                  Plan to inform MD of discharge.    Zara Chess.   [place] Care Management  [phone]

## 2014-11-08 DIAGNOSIS — R338 Other retention of urine: Secondary | ICD-10-CM | POA: Diagnosis not present

## 2014-11-12 ENCOUNTER — Telehealth: Payer: Self-pay | Admitting: *Deleted

## 2014-11-12 ENCOUNTER — Other Ambulatory Visit: Payer: Self-pay | Admitting: *Deleted

## 2014-11-12 DIAGNOSIS — I4891 Unspecified atrial fibrillation: Secondary | ICD-10-CM | POA: Diagnosis not present

## 2014-11-12 DIAGNOSIS — L03116 Cellulitis of left lower limb: Secondary | ICD-10-CM | POA: Diagnosis not present

## 2014-11-12 DIAGNOSIS — F1020 Alcohol dependence, uncomplicated: Secondary | ICD-10-CM | POA: Diagnosis not present

## 2014-11-12 DIAGNOSIS — I10 Essential (primary) hypertension: Secondary | ICD-10-CM | POA: Diagnosis not present

## 2014-11-12 DIAGNOSIS — M6281 Muscle weakness (generalized): Secondary | ICD-10-CM | POA: Diagnosis not present

## 2014-11-12 DIAGNOSIS — K7030 Alcoholic cirrhosis of liver without ascites: Secondary | ICD-10-CM | POA: Diagnosis not present

## 2014-11-12 DIAGNOSIS — K219 Gastro-esophageal reflux disease without esophagitis: Secondary | ICD-10-CM | POA: Diagnosis not present

## 2014-11-12 DIAGNOSIS — L03115 Cellulitis of right lower limb: Secondary | ICD-10-CM | POA: Diagnosis not present

## 2014-11-12 NOTE — Patient Outreach (Signed)
[facility]) Care Management  11/12/2014  LOLA LOFARO [birth year] [id number]   Phone call to patient to assess for continued social work needs.  Per patient, he is doing is exercises and getting out more.  Per patient, he was sitting out on the porch at the time of this social workers call which is out of the norm for patient.  Patient describes an improvement in his mood and relationship with his spouse. Allowing her time to herself has been beneficial.  Education officer, museum to perform a case closure due to improvements made.   Sheralyn Boatman [facility] Care Management [phone]

## 2014-11-12 NOTE — Telephone Encounter (Signed)
agreed

## 2014-11-12 NOTE — Telephone Encounter (Signed)
[name] with [facility] called to report that Bob Wilson fell on 11/04/2014 getting out of the shower.  He landed on his right side.  He reports shoulder and hip pain but does not want to go to the doctor.  He states it is not that bad.  Jackelyn Poling thinks he is just bruised and is requesting a referral for PT.  Verbal order given for PT thru [facility].

## 2014-11-14 ENCOUNTER — Other Ambulatory Visit: Payer: Self-pay | Admitting: Urology

## 2014-11-16 DIAGNOSIS — L03115 Cellulitis of right lower limb: Secondary | ICD-10-CM | POA: Diagnosis not present

## 2014-11-16 DIAGNOSIS — K7030 Alcoholic cirrhosis of liver without ascites: Secondary | ICD-10-CM | POA: Diagnosis not present

## 2014-11-16 DIAGNOSIS — I4891 Unspecified atrial fibrillation: Secondary | ICD-10-CM | POA: Diagnosis not present

## 2014-11-16 DIAGNOSIS — L03116 Cellulitis of left lower limb: Secondary | ICD-10-CM | POA: Diagnosis not present

## 2014-11-16 DIAGNOSIS — I10 Essential (primary) hypertension: Secondary | ICD-10-CM | POA: Diagnosis not present

## 2014-11-16 DIAGNOSIS — K219 Gastro-esophageal reflux disease without esophagitis: Secondary | ICD-10-CM | POA: Diagnosis not present

## 2014-11-16 DIAGNOSIS — F1020 Alcohol dependence, uncomplicated: Secondary | ICD-10-CM | POA: Diagnosis not present

## 2014-11-16 DIAGNOSIS — M6281 Muscle weakness (generalized): Secondary | ICD-10-CM | POA: Diagnosis not present

## 2014-11-19 DIAGNOSIS — K219 Gastro-esophageal reflux disease without esophagitis: Secondary | ICD-10-CM | POA: Diagnosis not present

## 2014-11-19 DIAGNOSIS — L03115 Cellulitis of right lower limb: Secondary | ICD-10-CM | POA: Diagnosis not present

## 2014-11-19 DIAGNOSIS — F1020 Alcohol dependence, uncomplicated: Secondary | ICD-10-CM | POA: Diagnosis not present

## 2014-11-19 DIAGNOSIS — I4891 Unspecified atrial fibrillation: Secondary | ICD-10-CM | POA: Diagnosis not present

## 2014-11-19 DIAGNOSIS — K7030 Alcoholic cirrhosis of liver without ascites: Secondary | ICD-10-CM | POA: Diagnosis not present

## 2014-11-19 DIAGNOSIS — L03116 Cellulitis of left lower limb: Secondary | ICD-10-CM | POA: Diagnosis not present

## 2014-11-19 DIAGNOSIS — M6281 Muscle weakness (generalized): Secondary | ICD-10-CM | POA: Diagnosis not present

## 2014-11-19 DIAGNOSIS — I10 Essential (primary) hypertension: Secondary | ICD-10-CM | POA: Diagnosis not present

## 2014-11-23 DIAGNOSIS — I4891 Unspecified atrial fibrillation: Secondary | ICD-10-CM | POA: Diagnosis not present

## 2014-11-23 DIAGNOSIS — L03115 Cellulitis of right lower limb: Secondary | ICD-10-CM | POA: Diagnosis not present

## 2014-11-23 DIAGNOSIS — L03116 Cellulitis of left lower limb: Secondary | ICD-10-CM | POA: Diagnosis not present

## 2014-11-23 DIAGNOSIS — K7030 Alcoholic cirrhosis of liver without ascites: Secondary | ICD-10-CM | POA: Diagnosis not present

## 2014-11-23 DIAGNOSIS — M6281 Muscle weakness (generalized): Secondary | ICD-10-CM | POA: Diagnosis not present

## 2014-11-23 DIAGNOSIS — F1020 Alcohol dependence, uncomplicated: Secondary | ICD-10-CM | POA: Diagnosis not present

## 2014-11-23 DIAGNOSIS — I10 Essential (primary) hypertension: Secondary | ICD-10-CM | POA: Diagnosis not present

## 2014-11-23 DIAGNOSIS — K219 Gastro-esophageal reflux disease without esophagitis: Secondary | ICD-10-CM | POA: Diagnosis not present

## 2014-11-26 DIAGNOSIS — K219 Gastro-esophageal reflux disease without esophagitis: Secondary | ICD-10-CM | POA: Diagnosis not present

## 2014-11-26 DIAGNOSIS — F1020 Alcohol dependence, uncomplicated: Secondary | ICD-10-CM | POA: Diagnosis not present

## 2014-11-26 DIAGNOSIS — M6281 Muscle weakness (generalized): Secondary | ICD-10-CM | POA: Diagnosis not present

## 2014-11-26 DIAGNOSIS — I10 Essential (primary) hypertension: Secondary | ICD-10-CM | POA: Diagnosis not present

## 2014-11-26 DIAGNOSIS — L03115 Cellulitis of right lower limb: Secondary | ICD-10-CM | POA: Diagnosis not present

## 2014-11-26 DIAGNOSIS — K7030 Alcoholic cirrhosis of liver without ascites: Secondary | ICD-10-CM | POA: Diagnosis not present

## 2014-11-26 DIAGNOSIS — L03116 Cellulitis of left lower limb: Secondary | ICD-10-CM | POA: Diagnosis not present

## 2014-11-26 DIAGNOSIS — I4891 Unspecified atrial fibrillation: Secondary | ICD-10-CM | POA: Diagnosis not present

## 2014-11-28 DIAGNOSIS — I10 Essential (primary) hypertension: Secondary | ICD-10-CM | POA: Diagnosis not present

## 2014-11-28 DIAGNOSIS — F1020 Alcohol dependence, uncomplicated: Secondary | ICD-10-CM | POA: Diagnosis not present

## 2014-11-28 DIAGNOSIS — M6281 Muscle weakness (generalized): Secondary | ICD-10-CM | POA: Diagnosis not present

## 2014-11-28 DIAGNOSIS — K7030 Alcoholic cirrhosis of liver without ascites: Secondary | ICD-10-CM | POA: Diagnosis not present

## 2014-11-28 DIAGNOSIS — L03115 Cellulitis of right lower limb: Secondary | ICD-10-CM | POA: Diagnosis not present

## 2014-11-28 DIAGNOSIS — L03116 Cellulitis of left lower limb: Secondary | ICD-10-CM | POA: Diagnosis not present

## 2014-11-28 DIAGNOSIS — K219 Gastro-esophageal reflux disease without esophagitis: Secondary | ICD-10-CM | POA: Diagnosis not present

## 2014-11-28 DIAGNOSIS — I4891 Unspecified atrial fibrillation: Secondary | ICD-10-CM | POA: Diagnosis not present

## 2014-11-29 DIAGNOSIS — N390 Urinary tract infection, site not specified: Secondary | ICD-10-CM | POA: Diagnosis not present

## 2014-11-29 DIAGNOSIS — R338 Other retention of urine: Secondary | ICD-10-CM | POA: Diagnosis not present

## 2014-12-05 ENCOUNTER — Other Ambulatory Visit: Payer: Self-pay | Admitting: Family Medicine

## 2014-12-05 DIAGNOSIS — L03115 Cellulitis of right lower limb: Secondary | ICD-10-CM | POA: Diagnosis not present

## 2014-12-05 DIAGNOSIS — K7030 Alcoholic cirrhosis of liver without ascites: Secondary | ICD-10-CM | POA: Diagnosis not present

## 2014-12-05 DIAGNOSIS — I10 Essential (primary) hypertension: Secondary | ICD-10-CM | POA: Diagnosis not present

## 2014-12-05 DIAGNOSIS — K219 Gastro-esophageal reflux disease without esophagitis: Secondary | ICD-10-CM | POA: Diagnosis not present

## 2014-12-05 DIAGNOSIS — M6281 Muscle weakness (generalized): Secondary | ICD-10-CM | POA: Diagnosis not present

## 2014-12-05 DIAGNOSIS — F1020 Alcohol dependence, uncomplicated: Secondary | ICD-10-CM | POA: Diagnosis not present

## 2014-12-05 DIAGNOSIS — I4891 Unspecified atrial fibrillation: Secondary | ICD-10-CM | POA: Diagnosis not present

## 2014-12-05 DIAGNOSIS — L03116 Cellulitis of left lower limb: Secondary | ICD-10-CM | POA: Diagnosis not present

## 2014-12-07 DIAGNOSIS — M6281 Muscle weakness (generalized): Secondary | ICD-10-CM | POA: Diagnosis not present

## 2014-12-07 DIAGNOSIS — I4891 Unspecified atrial fibrillation: Secondary | ICD-10-CM | POA: Diagnosis not present

## 2014-12-07 DIAGNOSIS — F1020 Alcohol dependence, uncomplicated: Secondary | ICD-10-CM | POA: Diagnosis not present

## 2014-12-07 DIAGNOSIS — I10 Essential (primary) hypertension: Secondary | ICD-10-CM | POA: Diagnosis not present

## 2014-12-07 DIAGNOSIS — L03115 Cellulitis of right lower limb: Secondary | ICD-10-CM | POA: Diagnosis not present

## 2014-12-07 DIAGNOSIS — K7030 Alcoholic cirrhosis of liver without ascites: Secondary | ICD-10-CM | POA: Diagnosis not present

## 2014-12-07 DIAGNOSIS — K219 Gastro-esophageal reflux disease without esophagitis: Secondary | ICD-10-CM | POA: Diagnosis not present

## 2014-12-07 DIAGNOSIS — L03116 Cellulitis of left lower limb: Secondary | ICD-10-CM | POA: Diagnosis not present

## 2014-12-11 DIAGNOSIS — K219 Gastro-esophageal reflux disease without esophagitis: Secondary | ICD-10-CM | POA: Diagnosis not present

## 2014-12-11 DIAGNOSIS — I10 Essential (primary) hypertension: Secondary | ICD-10-CM | POA: Diagnosis not present

## 2014-12-11 DIAGNOSIS — K7030 Alcoholic cirrhosis of liver without ascites: Secondary | ICD-10-CM | POA: Diagnosis not present

## 2014-12-11 DIAGNOSIS — I4891 Unspecified atrial fibrillation: Secondary | ICD-10-CM | POA: Diagnosis not present

## 2014-12-11 DIAGNOSIS — F1020 Alcohol dependence, uncomplicated: Secondary | ICD-10-CM | POA: Diagnosis not present

## 2014-12-11 DIAGNOSIS — M6281 Muscle weakness (generalized): Secondary | ICD-10-CM | POA: Diagnosis not present

## 2014-12-11 DIAGNOSIS — L03116 Cellulitis of left lower limb: Secondary | ICD-10-CM | POA: Diagnosis not present

## 2014-12-11 DIAGNOSIS — L03115 Cellulitis of right lower limb: Secondary | ICD-10-CM | POA: Diagnosis not present

## 2014-12-17 DIAGNOSIS — L03116 Cellulitis of left lower limb: Secondary | ICD-10-CM | POA: Diagnosis not present

## 2014-12-17 DIAGNOSIS — K219 Gastro-esophageal reflux disease without esophagitis: Secondary | ICD-10-CM | POA: Diagnosis not present

## 2014-12-17 DIAGNOSIS — M6281 Muscle weakness (generalized): Secondary | ICD-10-CM | POA: Diagnosis not present

## 2014-12-17 DIAGNOSIS — L03115 Cellulitis of right lower limb: Secondary | ICD-10-CM | POA: Diagnosis not present

## 2014-12-17 DIAGNOSIS — I4891 Unspecified atrial fibrillation: Secondary | ICD-10-CM | POA: Diagnosis not present

## 2014-12-17 DIAGNOSIS — F1020 Alcohol dependence, uncomplicated: Secondary | ICD-10-CM | POA: Diagnosis not present

## 2014-12-17 DIAGNOSIS — I10 Essential (primary) hypertension: Secondary | ICD-10-CM | POA: Diagnosis not present

## 2014-12-17 DIAGNOSIS — K7030 Alcoholic cirrhosis of liver without ascites: Secondary | ICD-10-CM | POA: Diagnosis not present

## 2014-12-18 ENCOUNTER — Encounter (HOSPITAL_COMMUNITY)
Admission: RE | Admit: 2014-12-18 | Discharge: 2014-12-18 | Disposition: A | Discharge status: Home / Self Care | Payer: Medicare Other | Source: Ambulatory Visit | Attending: Urology | Admitting: Urology

## 2014-12-18 ENCOUNTER — Telehealth: Payer: Self-pay | Admitting: Cardiology

## 2014-12-18 ENCOUNTER — Ambulatory Visit (HOSPITAL_COMMUNITY)
Admission: RE | Admit: 2014-12-18 | Discharge: 2014-12-18 | Disposition: A | Discharge status: Home / Self Care | Payer: Medicare Other | Source: Ambulatory Visit | Attending: Anesthesiology | Admitting: Anesthesiology

## 2014-12-18 ENCOUNTER — Encounter (HOSPITAL_COMMUNITY): Payer: Self-pay

## 2014-12-18 DIAGNOSIS — Z87891 Personal history of nicotine dependence: Secondary | ICD-10-CM | POA: Diagnosis not present

## 2014-12-18 DIAGNOSIS — Z01818 Encounter for other preprocedural examination: Secondary | ICD-10-CM | POA: Insufficient documentation

## 2014-12-18 DIAGNOSIS — R9389 Abnormal findings on diagnostic imaging of other specified body structures: Secondary | ICD-10-CM

## 2014-12-18 DIAGNOSIS — R0602 Shortness of breath: Secondary | ICD-10-CM | POA: Insufficient documentation

## 2014-12-18 DIAGNOSIS — R05 Cough: Secondary | ICD-10-CM | POA: Diagnosis not present

## 2014-12-18 DIAGNOSIS — J449 Chronic obstructive pulmonary disease, unspecified: Secondary | ICD-10-CM | POA: Diagnosis not present

## 2014-12-18 HISTORY — DX: Repeated falls: R29.6

## 2014-12-18 HISTORY — DX: Presence of other specified devices: Z97.8

## 2014-12-18 HISTORY — DX: Presence of urogenital implants: Z96.0

## 2014-12-18 HISTORY — DX: Cardiac arrhythmia, unspecified: I49.9

## 2014-12-18 LAB — COMPREHENSIVE METABOLIC PANEL
ALT: 27 U/L (ref 17–63)
AST: 63 U/L — AB (ref 15–41)
Albumin: 3 g/dL — ABNORMAL LOW (ref 3.5–5.0)
Alkaline Phosphatase: 164 U/L — ABNORMAL HIGH (ref 38–126)
Anion gap: 18 — ABNORMAL HIGH (ref 5–15)
BILIRUBIN TOTAL: 2.4 mg/dL — AB (ref 0.3–1.2)
BUN: 12 mg/dL (ref 6–20)
CO2: 28 mmol/L (ref 22–32)
Calcium: 8.1 mg/dL — ABNORMAL LOW (ref 8.9–10.3)
Chloride: 84 mmol/L — ABNORMAL LOW (ref 101–111)
Creatinine, Ser: 1.45 mg/dL — ABNORMAL HIGH (ref 0.61–1.24)
GFR calc Af Amer: 51 mL/min — ABNORMAL LOW (ref >60)
GFR calc non Af Amer: 44 mL/min — ABNORMAL LOW (ref >60)
Glucose, Bld: 72 mg/dL (ref 65–99)
Potassium: 3 mmol/L — ABNORMAL LOW (ref 3.5–5.1)
Sodium: 130 mmol/L — ABNORMAL LOW (ref 135–145)
Total Protein: 7.4 g/dL (ref 6.5–8.1)

## 2014-12-18 LAB — NO BLOOD PRODUCTS

## 2014-12-18 LAB — CBC
HEMATOCRIT: 31.3 % — AB (ref 39.0–52.0)
Hemoglobin: 11.5 g/dL — ABNORMAL LOW (ref 13.0–17.0)
MCH: 35.2 pg — ABNORMAL HIGH (ref 26.0–34.0)
MCHC: 36.7 g/dL — AB (ref 30.0–36.0)
MCV: 95.7 fL (ref 78.0–100.0)
Platelets: 243 10*3/uL (ref 150–400)
RBC: 3.27 MIL/uL — ABNORMAL LOW (ref 4.22–5.81)
RDW: 18.2 % — ABNORMAL HIGH (ref 11.5–15.5)
WBC: 9.6 10*3/uL (ref 4.0–10.5)

## 2014-12-18 NOTE — Telephone Encounter (Signed)
Patient was recently seen - Nuc Negative & Echo relatively normal. Has Afib - with borderline rate control. Only on ASA  - not on warfarin or NOAC.  Otherwise LOW RISK Patient for Low Risk Patient.  Leonie Man, MD

## 2014-12-18 NOTE — Telephone Encounter (Signed)
Calling about

## 2014-12-18 NOTE — Progress Notes (Signed)
CXR per [name] visit 12/18/2014 in epic sent to Dr Jacinto Reap [name]

## 2014-12-18 NOTE — Progress Notes (Addendum)
EKG per epic 09/26/2014 ECHO epic 09/03/2014 Stress Test epic 10/12/2014 OV note per Dr Ellyn Hack / Cardiology in epic 11/05/2014 have requested cardiology clearance

## 2014-12-18 NOTE — Telephone Encounter (Signed)
Returned call to [place] Dr.Harding advised patient low risk for 12/21/14 TURP.

## 2014-12-18 NOTE — Progress Notes (Signed)
Your patient has screened at an elevated risk for Obstructive Sleep Apnea using the Stop-Bang Tool during a pre-surgical vist. A score of 4 or greater is an elevated risk. Score of 4. 

## 2014-12-18 NOTE — Patient Instructions (Signed)
Bob Wilson  12/18/2014   Your procedure is scheduled on: Friday December 21, 2014   Report to [facility] Main  Entrance and follow signs to               [facility] arrive at 8:15 AM.  Call this number if you have problems the morning of surgery [phone]   Remember: ONLY 1 PERSON MAY GO WITH YOU TO SHORT STAY TO GET  READY MORNING OF [place].  Do not eat food or drink liquids :After Midnight.     Take these medicines the morning of surgery with A SIP OF WATER: Diltiazem (Cardizem)                               You may not have any metal on your body including hair pins and              piercings  Do not wear jewelry,  lotions, powders or colognes, deodorant                          Men may shave face and neck.   Do not bring valuables to the hospital. [place].  Contacts, dentures or bridgework may not be worn into surgery.      Patients discharged the day of surgery will not be allowed to drive home.  Name and phone number of your driver:Bob Wilson (wife)   _____________________________________________________________________             [facility] - Preparing for Surgery Before surgery, you can play an important role.  Because skin is not sterile, your skin needs to be as free of germs as possible.  You can reduce the number of germs on your skin by washing with CHG (chlorahexidine gluconate) soap before surgery.  CHG is an antiseptic cleaner which kills germs and bonds with the skin to continue killing germs even after washing. Please DO NOT use if you have an allergy to CHG or antibacterial soaps.  If your skin becomes reddened/irritated stop using the CHG and inform your nurse when you arrive at Short Stay. Do not shave (including legs and underarms) for at least 48 hours prior to the first CHG shower.  You may shave your face/neck. Please follow these instructions carefully:  1.   Shower with CHG Soap the night before surgery and the  morning of Surgery.  2.  If you choose to wash your hair, wash your hair first as usual with your  normal  shampoo.  3.  After you shampoo, rinse your hair and body thoroughly to remove the  shampoo.                           4.  Use CHG as you would any other liquid soap.  You can apply chg directly  to the skin and wash                       Gently with a scrungie or clean washcloth.  5.  Apply the CHG Soap to your body ONLY FROM THE NECK DOWN.   Do not use on face/  open                           Wound or open sores. Avoid contact with eyes, ears mouth and genitals (private parts).                       Wash face,  Genitals (private parts) with your normal soap.             6.  Wash thoroughly, paying special attention to the area where your surgery  will be performed.  7.  Thoroughly rinse your body with warm water from the neck down.  8.  DO NOT shower/wash with your normal soap after using and rinsing off  the CHG Soap.                9.  [name] yourself dry with a clean towel.            10.  Wear clean pajamas.            11.  Place clean sheets on your bed the night of your first shower and do not  sleep with pets. Day of Surgery : Do not apply any lotions/deodorants the morning of surgery.  Please wear clean clothes to the hospital/surgery center.  FAILURE TO FOLLOW THESE INSTRUCTIONS MAY RESULT IN THE CANCELLATION OF YOUR SURGERY PATIENT SIGNATURE_________________________________  NURSE SIGNATURE__________________________________  ________________________________________________________________________

## 2014-12-18 NOTE — Progress Notes (Addendum)
Contacted Dr Herrick's office in regards to pts religious beliefs and refusing blood products-spoke with Garnette Gunner CMA for Dr [name]. Also faxed copy of refusal of blood and/or blood products to Dr Herrick's office.

## 2014-12-18 NOTE — Progress Notes (Signed)
CBC and CMP results per [name] visit 12/18/2014 in epic sent to Dr [name]

## 2014-12-18 NOTE — Progress Notes (Signed)
pts wife called to state pt was placed on Ciprofloxacin 500 mg tabs 1 2 times per day on 12/14/2014. Appears pt will still have approx 1 pill left morning of surgery due to pt missed a dose. Instructed pt's wife  that it was okay for pt  to take morning of surgery with a sip of water. Verbalized understanding.

## 2014-12-18 NOTE — Telephone Encounter (Signed)
Received a call from [place] with [facility].She stated pt is scheduled to have a TURP 12/21/14 with Dr.Herrick and needs a surgical clearance from [place].Stated she needs Dr.Harding to put in epic cleared for surgery or pt is low risk.Message sent to [place].

## 2014-12-20 NOTE — Progress Notes (Signed)
Clearance per epic per Dr Ellyn Hack 12/18/2014

## 2014-12-21 ENCOUNTER — Ambulatory Visit (HOSPITAL_COMMUNITY): Payer: Medicare Other | Admitting: Anesthesiology

## 2014-12-21 ENCOUNTER — Encounter (HOSPITAL_COMMUNITY): Payer: Self-pay | Admitting: *Deleted

## 2014-12-21 ENCOUNTER — Encounter (HOSPITAL_COMMUNITY)
Admission: RE | Disposition: A | Discharge status: Home / Self Care | Payer: Self-pay | Source: Ambulatory Visit | Attending: Urology

## 2014-12-21 ENCOUNTER — Other Ambulatory Visit: Payer: Self-pay

## 2014-12-21 ENCOUNTER — Ambulatory Visit (HOSPITAL_COMMUNITY)
Admission: RE | Admit: 2014-12-21 | Discharge: 2014-12-21 | Disposition: A | Discharge status: Home / Self Care | Payer: Medicare Other | Source: Ambulatory Visit | Attending: Urology | Admitting: Urology

## 2014-12-21 DIAGNOSIS — Z792 Long term (current) use of antibiotics: Secondary | ICD-10-CM | POA: Diagnosis not present

## 2014-12-21 DIAGNOSIS — K769 Liver disease, unspecified: Secondary | ICD-10-CM | POA: Insufficient documentation

## 2014-12-21 DIAGNOSIS — F1910 Other psychoactive substance abuse, uncomplicated: Secondary | ICD-10-CM | POA: Insufficient documentation

## 2014-12-21 DIAGNOSIS — N138 Other obstructive and reflux uropathy: Secondary | ICD-10-CM | POA: Insufficient documentation

## 2014-12-21 DIAGNOSIS — N320 Bladder-neck obstruction: Secondary | ICD-10-CM | POA: Diagnosis not present

## 2014-12-21 DIAGNOSIS — R338 Other retention of urine: Secondary | ICD-10-CM | POA: Diagnosis not present

## 2014-12-21 DIAGNOSIS — N401 Enlarged prostate with lower urinary tract symptoms: Secondary | ICD-10-CM | POA: Diagnosis not present

## 2014-12-21 DIAGNOSIS — M109 Gout, unspecified: Secondary | ICD-10-CM | POA: Insufficient documentation

## 2014-12-21 DIAGNOSIS — N390 Urinary tract infection, site not specified: Secondary | ICD-10-CM | POA: Insufficient documentation

## 2014-12-21 DIAGNOSIS — Z791 Long term (current) use of non-steroidal anti-inflammatories (NSAID): Secondary | ICD-10-CM | POA: Insufficient documentation

## 2014-12-21 DIAGNOSIS — Z87891 Personal history of nicotine dependence: Secondary | ICD-10-CM | POA: Diagnosis not present

## 2014-12-21 DIAGNOSIS — K7030 Alcoholic cirrhosis of liver without ascites: Secondary | ICD-10-CM | POA: Insufficient documentation

## 2014-12-21 DIAGNOSIS — K219 Gastro-esophageal reflux disease without esophagitis: Secondary | ICD-10-CM | POA: Diagnosis not present

## 2014-12-21 DIAGNOSIS — J869 Pyothorax without fistula: Secondary | ICD-10-CM | POA: Insufficient documentation

## 2014-12-21 DIAGNOSIS — R339 Retention of urine, unspecified: Secondary | ICD-10-CM

## 2014-12-21 DIAGNOSIS — Z79899 Other long term (current) drug therapy: Secondary | ICD-10-CM | POA: Diagnosis not present

## 2014-12-21 DIAGNOSIS — I10 Essential (primary) hypertension: Secondary | ICD-10-CM | POA: Diagnosis not present

## 2014-12-21 DIAGNOSIS — G990 Autonomic neuropathy in diseases classified elsewhere: Secondary | ICD-10-CM | POA: Diagnosis not present

## 2014-12-21 DIAGNOSIS — M1990 Unspecified osteoarthritis, unspecified site: Secondary | ICD-10-CM | POA: Insufficient documentation

## 2014-12-21 HISTORY — PX: GREEN LIGHT LASER TURP (TRANSURETHRAL RESECTION OF PROSTATE: SHX6260

## 2014-12-21 LAB — URINE CULTURE: Colony Count: >=100000

## 2014-12-21 LAB — NO BLOOD PRODUCTS

## 2014-12-21 SURGERY — GREEN LIGHT LASER TURP (TRANSURETHRAL RESECTION OF PROSTATE
Anesthesia: Spinal

## 2014-12-21 MED ORDER — BUPIVACAINE IN DEXTROSE 0.75-8.25 % IT SOLN
INTRATHECAL | Status: DC | PRN
  Administered 2014-12-21: 15 mg via INTRATHECAL

## 2014-12-21 MED ORDER — KETOROLAC TROMETHAMINE 30 MG/ML IJ SOLN
INTRAMUSCULAR | Status: AC
  Filled 2014-12-21: qty 1

## 2014-12-21 MED ORDER — ACETAMINOPHEN-CODEINE #3 300-30 MG PO TABS: 1.0000 | ORAL_TABLET | ORAL | Status: DC | PRN

## 2014-12-21 MED ORDER — FENTANYL CITRATE (PF) 100 MCG/2ML IJ SOLN: 25.0000 ug | INTRAMUSCULAR | Status: DC | PRN

## 2014-12-21 MED ORDER — BELLADONNA ALKALOIDS-OPIUM 16.2-60 MG RE SUPP
RECTAL | Status: DC | PRN
  Administered 2014-12-21: 1 via RECTAL

## 2014-12-21 MED ORDER — PROMETHAZINE HCL 25 MG/ML IJ SOLN: 6.2500 mg | INTRAMUSCULAR | Status: DC | PRN

## 2014-12-21 MED ORDER — ESMOLOL HCL 10 MG/ML IV SOLN
INTRAVENOUS | Status: AC
  Filled 2014-12-21: qty 10

## 2014-12-21 MED ORDER — CIPROFLOXACIN HCL 500 MG PO TABS: 500.0000 mg | ORAL_TABLET | Freq: Two times a day (BID) | ORAL | Status: AC

## 2014-12-21 MED ORDER — ONDANSETRON HCL 4 MG/2ML IJ SOLN
INTRAMUSCULAR | Status: AC
  Filled 2014-12-21: qty 2

## 2014-12-21 MED ORDER — LIDOCAINE HCL (CARDIAC) 20 MG/ML IV SOLN
INTRAVENOUS | Status: AC
Start: 2014-12-21 — End: 2014-12-21
  Filled 2014-12-21: qty 5

## 2014-12-21 MED ORDER — PHENYLEPHRINE HCL 10 MG/ML IJ SOLN
INTRAMUSCULAR | Status: DC | PRN
  Administered 2014-12-21: 80 ug via INTRAVENOUS
  Administered 2014-12-21: 40 ug via INTRAVENOUS
  Administered 2014-12-21 (×2): 80 ug via INTRAVENOUS

## 2014-12-21 MED ORDER — PHENYLEPHRINE HCL 10 MG/ML IJ SOLN
INTRAMUSCULAR | Status: AC
  Filled 2014-12-21: qty 1

## 2014-12-21 MED ORDER — EPHEDRINE SULFATE 50 MG/ML IJ SOLN
INTRAMUSCULAR | Status: AC
  Filled 2014-12-21: qty 1

## 2014-12-21 MED ORDER — PROPOFOL 10 MG/ML IV BOLUS
INTRAVENOUS | Status: DC | PRN
  Administered 2014-12-21 (×2): 10 mg via INTRAVENOUS

## 2014-12-21 MED ORDER — PROPOFOL 10 MG/ML IV BOLUS
INTRAVENOUS | Status: AC
  Filled 2014-12-21: qty 20

## 2014-12-21 MED ORDER — LACTATED RINGERS IV SOLN
INTRAVENOUS | Status: DC | PRN
  Administered 2014-12-21: 10:00:00 via INTRAVENOUS

## 2014-12-21 MED ORDER — SODIUM CHLORIDE 0.9 % IV SOLN
10.0000 mg | INTRAVENOUS | Status: DC | PRN
  Administered 2014-12-21: 15 ug/min via INTRAVENOUS

## 2014-12-21 MED ORDER — LACTATED RINGERS IV SOLN
INTRAVENOUS | Status: DC
  Administered 2014-12-21: 1000 mL via INTRAVENOUS

## 2014-12-21 MED ORDER — CIPROFLOXACIN IN D5W 400 MG/200ML IV SOLN
400.0000 mg | Freq: Two times a day (BID) | INTRAVENOUS | Status: DC
  Administered 2014-12-21: 400 mg via INTRAVENOUS

## 2014-12-21 MED ORDER — BELLADONNA ALKALOIDS-OPIUM 16.2-60 MG RE SUPP
RECTAL | Status: AC
  Filled 2014-12-21: qty 1

## 2014-12-21 MED ORDER — CIPROFLOXACIN IN D5W 400 MG/200ML IV SOLN
INTRAVENOUS | Status: AC
Start: 2014-12-21 — End: 2014-12-21
  Filled 2014-12-21: qty 200

## 2014-12-21 MED ORDER — FENTANYL CITRATE (PF) 100 MCG/2ML IJ SOLN
INTRAMUSCULAR | Status: AC
  Filled 2014-12-21: qty 2

## 2014-12-21 MED ORDER — MEPERIDINE HCL 50 MG/ML IJ SOLN: 6.2500 mg | INTRAMUSCULAR | Status: DC | PRN

## 2014-12-21 MED ORDER — GENTAMICIN SULFATE 40 MG/ML IJ SOLN
5.0000 mg/kg | Freq: Once | INTRAVENOUS | Status: AC
  Administered 2014-12-21: 400 mg via INTRAVENOUS
  Filled 2014-12-21: qty 10

## 2014-12-21 MED ORDER — ONDANSETRON HCL 4 MG/2ML IJ SOLN
INTRAMUSCULAR | Status: DC | PRN
  Administered 2014-12-21: 4 mg via INTRAVENOUS

## 2014-12-21 MED ORDER — SODIUM CHLORIDE 0.9 % IR SOLN
Status: DC | PRN
  Administered 2014-12-21: 15000 mL
  Administered 2014-12-21: 1000 mL

## 2014-12-21 MED ORDER — LIDOCAINE HCL 1 % IJ SOLN
INTRAMUSCULAR | Status: DC | PRN
  Administered 2014-12-21: 50 mg via INTRADERMAL

## 2014-12-21 MED ORDER — FENTANYL CITRATE (PF) 100 MCG/2ML IJ SOLN
INTRAMUSCULAR | Status: DC | PRN
  Administered 2014-12-21 (×2): 25 ug via INTRAVENOUS

## 2014-12-21 MED ORDER — PROPOFOL INFUSION 10 MG/ML OPTIME
INTRAVENOUS | Status: DC | PRN
  Administered 2014-12-21: 75 ug/kg/min via INTRAVENOUS

## 2014-12-21 SURGICAL SUPPLY — 16 items
BAG URINE DRAINAGE (UROLOGICAL SUPPLIES) ×3 IMPLANT
BAG URO CATCHER STRL LF (DRAPE) ×3 IMPLANT
CATH FOLEY 2WAY SLVR  5CC 18FR (CATHETERS) ×2
CATH FOLEY 2WAY SLVR 5CC 18FR (CATHETERS) IMPLANT
CLOTH BEACON ORANGE TIMEOUT ST (SAFETY) ×3 IMPLANT
FEE RENTAL LASER GREENLIGHT (Laser) ×1 IMPLANT
GLOVE BIOGEL M STRL SZ7.5 (GLOVE) ×3 IMPLANT
HOLDER FOLEY CATH W/STRAP (MISCELLANEOUS) IMPLANT
IV NS 1000ML (IV SOLUTION) ×3
IV NS 1000ML BAXH (IV SOLUTION) ×1 IMPLANT
IV NS IRRIG 3000ML ARTHROMATIC (IV SOLUTION) ×3 IMPLANT
LASER FIBER /GREENLIGHT LASER (Laser) ×3 IMPLANT
LASER GREENLIGHT RENTAL P/PROC (Laser) ×3 IMPLANT
PACK CYSTO (CUSTOM PROCEDURE TRAY) ×3 IMPLANT
SYR 30ML LL (SYRINGE) IMPLANT
SYRINGE IRR TOOMEY STRL 70CC (SYRINGE) IMPLANT

## 2014-12-21 NOTE — H&P (Signed)
Reason For Visit Follow-up for acute urinary retention   History of Present Illness    81M referred by Dr. Cathlean Cower for eval and managment of urinary retentiatient with history of elevated PSA, referred to our practice in 2009 with PSA of 5.6, but no showed. History of non-compliance and alcoholism. Recently seen by Dr. Jenny Reichmann as new patient with acute onset diarrhea and noted to be extremely weak. Poor historian. From the clinic the patient was sent to the [facility] long emergency department where a Foley catheter was placed and 600 cc of urine drained immediately. A urine culture was then performed revealing a staph infection. This was initially treated with Keflex and then he was changed 4 days ago to Bactrim. He has been on Bactrim for 4 days.  Baseline the patient has no significant nocturia. He does have some nocturnal enuresis. He describes a weak stream and incomplete bladder emptying. Prior to being in the emergency department the patient was not taking any medication for his voiding symptoms. He has no history of previous UTI. He has no history of prostatitis. The patient has had rising PSAs, the most recent PSA according to his chart was approaching 15. He has refused evaluation for this. He has no family history of prostate cancer. He denies any gross hematuria.   DRE normal: not interested in treating elevated PSA.     09/19/13: Failed voiding trial x 2. On Flomax 0.8 mg daily, treated for UTI. Since the patient was last seen he is been hospitalized for A. fib.   11/01/14: UDS eval: Patient urodynamic evaluation today was performed to to assess detrusor function and degree of obstruction. The patient had a Foley catheter prior to the study. The patient had early sensation of fullness and a small capacity bladder. He also had a very strong unstable contraction with associated leakage at 102 mL's. When the patient was prompted to void he was able to generate a strong contraction with a PVR 50  mL's. Fluoroscopy showed a trabeculated bladder with an obstructing prosthetic imprint. The patient's flow rate at maximum detrusor pressure was 3.4 mL/seconds. Impression: The patient is clearly obstructed fortunately has good detrusor function.     Intv: The patient presents today to discuss his urodynamic evaluation and further management of his urinary retention. The urodynamic evaluation demonstrated bladder out obstruction with adequate detrusor function. At the time of the procedure the Foley catheter was replaced. Currently, the patient is having no trouble with his catheter at all. He denies any hematuria or pain. He continues to have peripheral neuropathy.   Past Medical History Problems  1. History of Anxiety and depression (F41.9,F32.9) 2. 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 History of arthritis (Z87.39) 4. History of esophageal reflux (Z87.19) 5. History of gout (Z87.39) 6. History of hyperlipidemia (Z86.39) 7. History of hypertension (Z86.79) 8. 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 History of Neck Surgery  Current Meds 1. Aleve TABS;  Therapy: (Recorded:12Jan2016) to Recorded 2. Ampicillin 500 MG Oral Capsule; TAKE 1 CAPSULE Every twelve hours;  Therapy: [id number] to (Evaluate:11Apr2016)  Requested for: 06Apr2016; Last  Rx:06Apr2016 Ordered 3. Colchicine 0.6 MG Oral Tablet;  Therapy: (Recorded:12Jan2016) to Recorded 4. Ensure LIQD;  Therapy: (Recorded:12Jan2016) to Recorded 5. Flomax 0.4 MG Oral Capsule;  Therapy: (Recorded:12Jan2016) to Recorded 6. Indomethacin 25 MG Oral Capsule;  Therapy: (Recorded:12Jan2016) to Recorded 7. Lasix 20 MG Oral Tablet;  Therapy: (Recorded:03Mar2016) to Recorded  Allergies Medication  1. ACE Inhibitors 2.  Advil 3. Aspirin TABS 4. Lisinopril TABS  Family History Problems  1. Family history of Death of family member : Mother, Father   Mother at age 62; unknownFather at  age 32; diabetes 2. Family history of diabetes mellitus (Z83.3) : Father  Social History Problems  1. Alcohol use (Z78.9)   2 2. Caffeine use (F15.90)   1 3. Former smoker [phone])   quit smoking 40 years ago <1/2 ppd 4. Married 5. Never a smoker 6. Number of children   2 sons and 3 step daughters 81. Retired  Review of Systems No changes in pts bowel habits, neurological changes, or progressive lower urinary tract symptoms.    Vitals Vital Signs [Data Includes: Last 1 Day]  Recorded: 21Apr2016 01:23PM  Blood Pressure: 144 / 87 Temperature: 97.4 F Heart Rate: 80  Assessment Assessed  1. Acute urinary retention (R33.8) 2. Urinary tract infection (N39.0)  Plan  Urinary tract infection  1. Follow-up [place] Procedure Office  Follow-up in 3 weeks for a Foley catheter change in the  urine culture  Status: Hold For - Appointment,Date of Service  Requested for: 21Apr2016  URINE CULTURE; Status:Hold For - Specimen/Data Collection,Appointment; Requested for:21Apr2016;  Perform:[facility]; Due:23Apr2016; Marked Important; Last Updated MM:CRFVOH, Santiago Glad; 11/08/2014 1:57:07 PM;Ordered;  KGO:VPCHEKB tract infection; Ordered TC:YELYHTM, Marland Kitchen;   Discussion/Summary We went over the treatment options for the patient in regards to his bladder outlet obstruction and urinary retention. I recommended that we proceed with a laser prostate ablative procedure that he can get his catheter out. The patient is reluctant to have any sort of surgery. I explained to him that if we pull his catheter out today, he will likely develop urinary retention, infection, and potentially other complications such as falling and/or requiring hospital admission. I discussed the greenlight laser procedure with the patient and his wife in detail. I explained to them that this is done in the outpatient setting typically in that he typically would be able to go home. We discussed the outcomes, I think it is likely that he  will void given his urodynamic analysis. I do think that he should be started on antibiotics at least 1 week prior to his procedure. We'll plan to change his catheter in 3 weeks, this will be 4 weeks total from the last catheter change. At that point, we will obtain a urine culture and start the patient on antibiotics. We'll work to get him scheduled for his greenlight laser prostate ablation in the near future. All questions were answered. I will obtain cardiac clearance prior to proceeding to the operating room for this patient given his comorbidities.

## 2014-12-21 NOTE — Progress Notes (Signed)
Patient has had spinal anesthesia for green light laser procedure of prostate. He states he has fallen a couple of times this past month. Prior to standing, RN had patient sit on side of stretcher to assess balance. Employed gait belt and walker to stand patient. He tolerates well. Able to assist patient to wheelchair for discharge. To car via wheelchair.

## 2014-12-21 NOTE — Anesthesia Postprocedure Evaluation (Signed)
  Anesthesia Post-op Note  Patient: Bob Wilson  Procedure(s) Performed: Procedure(s) (LRB): GREEN LIGHT LASER TURP (TRANSURETHRAL RESECTION OF PROSTATE (N/A)  Patient Location: PACU  Anesthesia Type: General  Level of Consciousness: awake and alert   Airway and Oxygen Therapy: Patient Spontanous Breathing  Post-op Pain: mild  Post-op Assessment: Post-op Vital signs reviewed, Patient's Cardiovascular Status Stable, Respiratory Function Stable, Patent Airway and No signs of Nausea or vomiting  Last Vitals:  Filed Vitals:   12/21/14 1500  BP: 136/90  Pulse: 132  Temp: 36.3 C  Resp: 21    Post-op Vital Signs: stable   Complications: No apparent anesthesia complications

## 2014-12-21 NOTE — Anesthesia Procedure Notes (Signed)
Spinal  Start time: 12/21/2014 11:35 AM End time: 12/21/2014 11:39 AM Staffing Resident/CRNA: Sherian Maroon A Preanesthetic Checklist Completed: patient identified, site marked, surgical consent, pre-op evaluation, timeout performed, IV checked, risks and benefits discussed and monitors and equipment checked Spinal Block Patient position: sitting Prep: Betadine Patient monitoring: heart rate, cardiac monitor, continuous pulse ox and blood pressure Approach: midline Location: L3-4 Injection technique: single-shot Needle Needle type: Spinocan  Needle gauge: 22 G Needle length: 9 cm Needle insertion depth: 5 cm

## 2014-12-21 NOTE — Interval H&P Note (Signed)
History and Physical Interval Note: CXR demonstrated a chronic appearing empyema.  Patient asymptomatic.  Anesthesia aware.   Patient has been on antibiotics for pre-op urine culture demonstrating pseudomonas. No changes to H and P 12/21/2014 5:00 AM  Bob Wilson  has presented today for surgery, with the diagnosis of BLADDER OUTLET OBSTRUCTION  The various methods of treatment have been discussed with the patient and family. After consideration of risks, benefits and other options for treatment, the patient has consented to  Procedure(s): GREEN LIGHT LASER TURP (TRANSURETHRAL RESECTION OF PROSTATE (N/A) as a surgical intervention .  The patient's history has been reviewed, patient examined, no change in status, stable for surgery.  I have reviewed the patient's chart and labs.  Questions were answered to the patient's satisfaction.     [name]

## 2014-12-21 NOTE — Op Note (Signed)
Preoperative diagnosis:  1. Bladder outlet obstruction secondary to enlarged prostate  2. Acute urinary retention  Postoperative diagnosis:  1. As above   Procedure:  1. Cystoscopy, greenlight laser ablation prostatectomy  Surgeon: Ardis Hughs, MD   Anesthesia: General   Complications: None  Intraoperative findings: trilobar hypertrophy, normal bladder  EBL: Minimal  Specimens: None  Indication: Bob Wilson is a 81 y.o. patient with urinary retention.  After reviewing the management options for treatment, he elected to proceed with the above surgical procedure(s). We have discussed the potential benefits and risks of the procedure, side effects of the proposed treatment, the likelihood of the patient achieving the goals of the procedure, and any potential problems that might occur during the procedure or recuperation. Informed consent has been obtained.  Description of procedure: The patient was taken to the operating room and general anesthesia was induced.  The patient was placed in the dorsal lithotomy position, prepped and draped in the usual sterile fashion, and preoperative antibiotics were administered. A preoperative time-out was performed.   The patient was taken to the operating room and general anesthesia was induced. The patient was placed in the dorsal lithotomy position, prepped and draped in the usual sterile fashion, and preoperative antibiotics were administered. A preoperative time-out was performed.   A 22 French cystoscope was then gently passed to the patient's urethra with the visual obturator the laser bridge sheath. Visual obturator was then exchanged for the laser bridge. I then performed a routine laser ablative prostatectomy starting at 7:00 the bladder neck and ablating the tissue down to the through the right prostatic apex creating a nice groove and delineating the margins of right lateral lobe. Then in a systematic fashion I ablated the tissues  starting at 11:00 through 7:00 at the bladder neck working down to the apex. I then repeated this process on the right side starting at approximately 5:00 at the bladder neck and working down to the right prostatic apex. I then systematically ablated the left lateral lobe starting at 1:00 through 5:00 the bladder neck and working down to the apex. I then turned my attention to the median lobe and systematically ablated the median lobe taking care not to undermine the bladder neck and staying well away from the ureteral orifices. Once an adequate channel had been created, all arterial bleeders were cauterized an 18F Foley catheter was placed into the patient's urethra and the bladder irrigated until the effluent was clear. The patient was subsequently extubated and returned to the PACU in excellent condition. There no immediate complications.  Disposition:  The patient will be discharged home today with Foley catheter placed, and return in 2 days for a voiding trial.   Ardis Hughs, M.D.

## 2014-12-21 NOTE — Anesthesia Preprocedure Evaluation (Signed)
Anesthesia Evaluation  Patient identified by MRN, date of birth, ID band Patient awake    Reviewed: Allergy & Precautions, NPO status , Patient's Chart, lab work & pertinent test results  Airway Mallampati: II  TM Distance: >3 FB Neck ROM: Full    Dental no notable dental hx. (+) Poor Dentition, Missing   Pulmonary neg pulmonary ROS, former smoker,  breath sounds clear to auscultation  Pulmonary exam normal       Cardiovascular hypertension, Pt. on medications negative cardio ROS Normal cardiovascular examRhythm:Regular Rate:Normal     Neuro/Psych negative neurological ROS  negative psych ROS   GI/Hepatic negative GI ROS, (+) Cirrhosis -    substance abuse  alcohol use,   Endo/Other  negative endocrine ROS  Renal/GU negative Renal ROS  negative genitourinary   Musculoskeletal negative musculoskeletal ROS (+)   Abdominal   Peds negative pediatric ROS (+)  Hematology  (+) [facility]  Anesthesia Other Findings   Reproductive/Obstetrics negative OB ROS                             Anesthesia Physical Anesthesia Plan  ASA: III  Anesthesia Plan: Spinal   Post-op Pain Management:    Induction:   Airway Management Planned: Simple Face Mask  Additional Equipment:   Intra-op Plan:   Post-operative Plan:   Informed Consent: I have reviewed the patients History and Physical, chart, labs and discussed the procedure including the risks, benefits and alternatives for the proposed anesthesia with the patient or authorized representative who has indicated his/her understanding and acceptance.   Dental advisory given  Plan Discussed with: CRNA  Anesthesia Plan Comments:         Anesthesia Quick Evaluation

## 2014-12-21 NOTE — Transfer of Care (Signed)
Immediate Anesthesia Transfer of Care Note  Patient: Bob Wilson  Procedure(s) Performed: Procedure(s): GREEN LIGHT LASER TURP (TRANSURETHRAL RESECTION OF PROSTATE (N/A)  Patient Location: PACU  Anesthesia Type:MAC and Spinal  Level of Consciousness: awake, alert , oriented and patient cooperative  Airway & Oxygen Therapy: Patient Spontanous Breathing and Patient connected to face mask oxygen  Post-op Assessment: Report given to RN and Post -op Vital signs reviewed and stable  Post vital signs: Reviewed and stable  Last Vitals:  Filed Vitals:   12/21/14 0736  BP: 119/74  Pulse: 133  Temp: 36.6 C  Resp: 16    Complications: No apparent anesthesia complications

## 2014-12-21 NOTE — Discharge Instructions (Signed)
Transurethral Resection of the Prostate (TURP) or Greenlight laser ablation of the Prostate ° °Care After ° °Refer to this sheet in the next few weeks. These discharge instructions provide you with general information on caring for yourself after you leave the hospital. Your caregiver may also give you specific instructions. Your treatment has been planned according to the most current medical practices available, but unavoidable complications sometimes occur. If you have any problems or questions after discharge, please call your caregiver. ° °HOME CARE INSTRUCTIONS  ° °Medications °· You may receive medicine for pain management. As your level of discomfort decreases, adjustments in your pain medicines may be made.  °· Take all medicines as directed.  °· You may be given a medicine (antibiotic) to kill germs following surgery. Finish all medicines. Let your caregiver know if you have any side effects or problems from the medicine.  °· If you are on aspirin, it would be best not to restart the aspirin until the blood in the urine clears °Hygiene °· You can take a shower after surgery.  °· You should not take a bath while you still have the urethral catheter. °Activity °· You will be encouraged to get out of bed as much as possible and increase your activity level as tolerated.  °· Spend the first week in and around your home. For 3 weeks, avoid the following:  °· Straining.  °· Running.  °· Strenuous work.  °· Walks longer than a few blocks.  °· Riding for extended periods.  °· Sexual relations.  °· Do not lift heavy objects (more than 20 pounds) for at least 1 month. When lifting, use your arms instead of your abdominal muscles.  °· You will be encouraged to walk as tolerated. Do not exert yourself. Increase your activity level slowly. Remember that it is important to keep moving after an operation of any type. This cuts down on the possibility of developing blood clots.  °· Your caregiver will tell you when you  can resume driving and light housework. Discuss this at your first office visit after discharge. °Diet °· No special diet is ordered after a TURP. However, if you are on a special diet for another medical problem, it should be continued.  °· Normal fluid intake is usually recommended.  °· Avoid alcohol and caffeinated drinks for 2 weeks. They irritate the bladder. Decaffeinated drinks are okay.  °· Avoid spicy foods.  °Bladder Function °· For the first 10 days, empty the bladder whenever you feel a definite desire. Do not try to hold the urine for long periods of time.  °· Urinating once or twice a night even after you are healed is not uncommon.  °· You may see some recurrence of blood in the urine after discharge from the hospital. This usually happens within 2 weeks after the procedure.If this occurs, force fluids again as you did in the hospital and reduce your activity.  °Bowel Function °· You may experience some constipation after surgery. This can be minimized by increasing fluids and fiber in your diet. Drink enough water and fluids to keep your urine clear or pale yellow.  °· A stool softener may be prescribed for use at home. Do not strain to move your bowels.  °· If you are requiring increased pain medicine, it is important that you take stool softeners to prevent constipation. This will help to promote proper healing by reducing the need to strain to move your bowels.  °Sexual Activity °· Semen movement   in the opposite direction and into the bladder (retrograde ejaculation) may occur. Since the semen passes into the bladder, cloudy urine can occur the first time you urinate after intercourse. Or, you may not have an ejaculation during erection. Ask your caregiver when you can resume sexual activity. Retrograde ejaculation and reduced semen discharge should not reduce one's pleasure of intercourse.  °Postoperative Visit °· Arrange the date and time of your after surgery visit with your caregiver.  °Return  to Work °· After your recovery is complete, you will be able to return to work and resume all activities. Your caregiver will inform you when you can return to work.  ° ° °Foley Catheter Care °A soft, flexible tube (Foley catheter) may have been placed in your bladder to drain urine and fluid. Follow these instructions: °Taking Care of the Catheter °· Keep the area where the catheter leaves your body clean.  °· Attach the catheter to the leg so there is no tension on the catheter.  °· Keep the drainage bag below the level of the bladder, but keep it OFF the floor.  °· Do not take long soaking baths. Your caregiver will give instructions about showering.  °· Wash your hands before touching ANYTHING related to the catheter or bag.  °· Using mild soap and warm water on a washcloth:  °· Clean the area closest to the catheter insertion site using a circular motion around the catheter.  °· Clean the catheter itself by wiping AWAY from the insertion site for several inches down the tube.  °· NEVER wipe upward as this could sweep bacteria up into the urethra (tube in your body that normally drains the bladder) and cause infection.  °· Place a Pankonin amount of sterile lubricant at the tip of the penis where the catheter is entering.  °Taking Care of the Drainage Bags °· Two drainage bags may be taken home: a large overnight drainage bag, and a smaller leg bag which fits underneath clothing.  °· It is okay to wear the overnight bag at any time, but NEVER wear the smaller leg bag at night.  °· Keep the drainage bag well below the level of your bladder. This prevents backflow of urine into the bladder and allows the urine to drain freely.  °· Anchor the tubing to your leg to prevent pulling or tension on the catheter. Use tape or a leg strap provided by the hospital.  °· Empty the drainage bag when it is 1/2 to 3/4 full. Wash your hands before and after touching the bag.  °· Periodically check the tubing for kinks to make sure  there is no pressure on the tubing which could restrict the flow of urine.  °Changing the Drainage Bags °· Cleanse both ends of the clean bag with alcohol before changing.  °· Pinch off the rubber catheter to avoid urine spillage during the disconnection.  °· Disconnect the dirty bag and connect the clean one.  °· Empty the dirty bag carefully to avoid a urine spill.  °· Attach the new bag to the leg with tape or a leg strap.  °Cleaning the Drainage Bags °· Whenever a drainage bag is disconnected, it must be cleaned quickly so it is ready for the next use.  °· Wash the bag in warm, soapy water.  °· Rinse the bag thoroughly with warm water.  °· Soak the bag for 30 minutes in a solution of white vinegar and water (1 cup vinegar to 1 quart warm   water).   Rinse with warm water.  SEEK MEDICAL CARE IF:   You have chills or night sweats.   You are leaking around your catheter or have problems with your catheter. It is not uncommon to have sporadic leakage around your catheter as a result of bladder spasms. If the leakage stops, there is not much need for concern. If you are uncertain, call your caregiver.   You develop side effects that you think are coming from your medicines.  SEEK IMMEDIATE MEDICAL CARE IF:   You are suddenly unable to urinate. Check to see if there are any kinks in the drainage tubing that may cause this. If you cannot find any kinks, call your caregiver immediately. This is an emergency.   You develop shortness of breath or chest pains.   Bleeding persists or clots develop in your urine.   You have a fever.   You develop pain in your back or over your lower belly (abdomen).   You develop pain or swelling in your legs.   Any problems you are having get worse rather than better.  MAKE SURE YOU:   Understand these instructions.   Will watch your condition.   Will get help right away if you are not doing well or get worse.     Spinal Anesthesia Care After HOME CARE  INSTRUCTIONS  Do not drive or operate machinery for at least 24 hours after receiving anesthesia. Make sure someone is available to drive you home.  Do not drink alcohol for at least 24 hours after receiving anesthesia.  Do not make important decisions for 24 hours after having spinal or epidural anesthesia. Your thinking may be unclear.  Have someone stay with you for at least 24 to 48 hours following surgery.  Drink lots of fluids when you get home. If you are an adult, drink eight, 8 ounce glasses of water per day, or as directed.  Keep your post-operative appointments as suggested. SEEK IMMEDIATE MEDICAL CARE IF:   You develop a fever or any temperature over 100.4 F (38 C).  You have a persistent or severe headache.  You develop blurred or double vision.  You develop dizziness, fainting or lightheadedness.  You have weakness, numbness, or tingling in your arms or legs.  You develop a skin rash.  You have difficulty breathing.  You have persistent nausea and vomiting.  You are unable to pass urine. Document Released: 09/26/2003 Document Revised: 09/28/2011 Document Reviewed: 08/09/2007 [facility] Patient Information 2015 [place], [place]. This information is not intended to replace advice given to you by your health care provider. Make sure you discuss any questions you have with your health care provider.

## 2014-12-24 ENCOUNTER — Encounter (HOSPITAL_COMMUNITY): Payer: Self-pay | Admitting: Urology

## 2014-12-25 DIAGNOSIS — L03116 Cellulitis of left lower limb: Secondary | ICD-10-CM | POA: Diagnosis not present

## 2014-12-25 DIAGNOSIS — K7030 Alcoholic cirrhosis of liver without ascites: Secondary | ICD-10-CM | POA: Diagnosis not present

## 2014-12-25 DIAGNOSIS — K219 Gastro-esophageal reflux disease without esophagitis: Secondary | ICD-10-CM | POA: Diagnosis not present

## 2014-12-25 DIAGNOSIS — L03115 Cellulitis of right lower limb: Secondary | ICD-10-CM | POA: Diagnosis not present

## 2014-12-25 DIAGNOSIS — I10 Essential (primary) hypertension: Secondary | ICD-10-CM | POA: Diagnosis not present

## 2014-12-25 DIAGNOSIS — M6281 Muscle weakness (generalized): Secondary | ICD-10-CM | POA: Diagnosis not present

## 2014-12-25 DIAGNOSIS — F1020 Alcohol dependence, uncomplicated: Secondary | ICD-10-CM | POA: Diagnosis not present

## 2014-12-25 DIAGNOSIS — I4891 Unspecified atrial fibrillation: Secondary | ICD-10-CM | POA: Diagnosis not present

## 2014-12-25 DIAGNOSIS — R338 Other retention of urine: Secondary | ICD-10-CM | POA: Diagnosis not present

## 2014-12-31 ENCOUNTER — Encounter: Payer: Self-pay | Admitting: *Deleted

## 2014-12-31 NOTE — Patient Outreach (Signed)
[facility]) Care Management  12/31/2014  Bob Wilson 12/11/1932 [id number]   Documentation encounter:  [facility] episode resolved as no longer following pt for case management, goals met (discharged  11/07/14).     Zara Chess.   [facility]  [phone]

## 2015-01-01 ENCOUNTER — Telehealth: Payer: Self-pay | Admitting: Family Medicine

## 2015-01-01 DIAGNOSIS — I10 Essential (primary) hypertension: Secondary | ICD-10-CM | POA: Diagnosis not present

## 2015-01-01 DIAGNOSIS — L03116 Cellulitis of left lower limb: Secondary | ICD-10-CM | POA: Diagnosis not present

## 2015-01-01 DIAGNOSIS — K7030 Alcoholic cirrhosis of liver without ascites: Secondary | ICD-10-CM | POA: Diagnosis not present

## 2015-01-01 DIAGNOSIS — L03115 Cellulitis of right lower limb: Secondary | ICD-10-CM | POA: Diagnosis not present

## 2015-01-01 DIAGNOSIS — I4891 Unspecified atrial fibrillation: Secondary | ICD-10-CM | POA: Diagnosis not present

## 2015-01-01 DIAGNOSIS — K219 Gastro-esophageal reflux disease without esophagitis: Secondary | ICD-10-CM | POA: Diagnosis not present

## 2015-01-01 DIAGNOSIS — F1020 Alcohol dependence, uncomplicated: Secondary | ICD-10-CM | POA: Diagnosis not present

## 2015-01-01 DIAGNOSIS — M6281 Muscle weakness (generalized): Secondary | ICD-10-CM | POA: Diagnosis not present

## 2015-01-01 NOTE — Telephone Encounter (Signed)
Verbal orders given to [place] with [facility] to re-cert [name] for another 9 weeks.

## 2015-01-01 NOTE — Telephone Encounter (Signed)
Agreed -

## 2015-01-01 NOTE — Telephone Encounter (Signed)
[facility] called requesting verbal order to re-cert him for another 9 weeks.   Call back number [phone]

## 2015-01-06 DIAGNOSIS — M6281 Muscle weakness (generalized): Secondary | ICD-10-CM | POA: Diagnosis not present

## 2015-01-06 DIAGNOSIS — I4891 Unspecified atrial fibrillation: Secondary | ICD-10-CM | POA: Diagnosis not present

## 2015-01-06 DIAGNOSIS — L03116 Cellulitis of left lower limb: Secondary | ICD-10-CM | POA: Diagnosis not present

## 2015-01-06 DIAGNOSIS — L03115 Cellulitis of right lower limb: Secondary | ICD-10-CM | POA: Diagnosis not present

## 2015-01-11 ENCOUNTER — Telehealth: Payer: Self-pay | Admitting: Family Medicine

## 2015-01-11 DIAGNOSIS — K219 Gastro-esophageal reflux disease without esophagitis: Secondary | ICD-10-CM | POA: Diagnosis not present

## 2015-01-11 DIAGNOSIS — L03116 Cellulitis of left lower limb: Secondary | ICD-10-CM | POA: Diagnosis not present

## 2015-01-11 DIAGNOSIS — M6281 Muscle weakness (generalized): Secondary | ICD-10-CM | POA: Diagnosis not present

## 2015-01-11 DIAGNOSIS — I10 Essential (primary) hypertension: Secondary | ICD-10-CM | POA: Diagnosis not present

## 2015-01-11 DIAGNOSIS — I4891 Unspecified atrial fibrillation: Secondary | ICD-10-CM | POA: Diagnosis not present

## 2015-01-11 DIAGNOSIS — L03115 Cellulitis of right lower limb: Secondary | ICD-10-CM | POA: Diagnosis not present

## 2015-01-11 DIAGNOSIS — K7030 Alcoholic cirrhosis of liver without ascites: Secondary | ICD-10-CM | POA: Diagnosis not present

## 2015-01-11 DIAGNOSIS — F1020 Alcohol dependence, uncomplicated: Secondary | ICD-10-CM | POA: Diagnosis not present

## 2015-01-11 NOTE — Telephone Encounter (Signed)
Olivia Mackie, a nurse from [facility], called stating she would like Dr. Lorelei Pont to know that pt is not taking meds, not eating or drinking, seems very depressed. She said the wife states he is taking Lasix, however nothing else. If you need to reach [facility] for more information, the best number to call is [phone].

## 2015-01-11 NOTE — Telephone Encounter (Signed)
This is not out of character. Historically very non-compliant.   Call patient / wife and see if he will come in the office to be checked.

## 2015-01-14 NOTE — Telephone Encounter (Signed)
Appointment scheduled with Dr. Lorelei Pont 01/16/2015 at 12:00 pm.

## 2015-01-15 DIAGNOSIS — F1020 Alcohol dependence, uncomplicated: Secondary | ICD-10-CM | POA: Diagnosis not present

## 2015-01-15 DIAGNOSIS — L03116 Cellulitis of left lower limb: Secondary | ICD-10-CM | POA: Diagnosis not present

## 2015-01-15 DIAGNOSIS — L03115 Cellulitis of right lower limb: Secondary | ICD-10-CM | POA: Diagnosis not present

## 2015-01-15 DIAGNOSIS — I10 Essential (primary) hypertension: Secondary | ICD-10-CM | POA: Diagnosis not present

## 2015-01-15 DIAGNOSIS — K7030 Alcoholic cirrhosis of liver without ascites: Secondary | ICD-10-CM | POA: Diagnosis not present

## 2015-01-15 DIAGNOSIS — M6281 Muscle weakness (generalized): Secondary | ICD-10-CM | POA: Diagnosis not present

## 2015-01-15 DIAGNOSIS — I4891 Unspecified atrial fibrillation: Secondary | ICD-10-CM | POA: Diagnosis not present

## 2015-01-15 DIAGNOSIS — K219 Gastro-esophageal reflux disease without esophagitis: Secondary | ICD-10-CM | POA: Diagnosis not present

## 2015-01-16 ENCOUNTER — Ambulatory Visit (INDEPENDENT_AMBULATORY_CARE_PROVIDER_SITE_OTHER): Payer: Medicare Other | Admitting: Family Medicine

## 2015-01-16 ENCOUNTER — Encounter: Payer: Self-pay | Admitting: Family Medicine

## 2015-01-16 VITALS — BP 114/72 | HR 83 | Temp 97.6°F

## 2015-01-16 DIAGNOSIS — K7030 Alcoholic cirrhosis of liver without ascites: Secondary | ICD-10-CM

## 2015-01-16 DIAGNOSIS — Z9119 Patient's noncompliance with other medical treatment and regimen: Secondary | ICD-10-CM | POA: Diagnosis not present

## 2015-01-16 DIAGNOSIS — E46 Unspecified protein-calorie malnutrition: Secondary | ICD-10-CM | POA: Diagnosis not present

## 2015-01-16 DIAGNOSIS — Z91199 Patient's noncompliance with other medical treatment and regimen due to unspecified reason: Secondary | ICD-10-CM

## 2015-01-16 DIAGNOSIS — F1020 Alcohol dependence, uncomplicated: Secondary | ICD-10-CM | POA: Diagnosis not present

## 2015-01-16 NOTE — Progress Notes (Signed)
Pre visit review using our clinic review tool, if applicable. No additional management support is needed unless otherwise documented below in the visit note. 

## 2015-01-16 NOTE — Progress Notes (Signed)
Dr. Frederico Hamman T. Adelai Achey, MD, [facility] and Sports Medicine [place], [address] Phone: [phone] Fax: [phone]  01/16/2015  Patient: Bob Wilson, MRN: [id number], DOB: 1933/06/08, 81 y.o.  Primary Physician:  Owens Loffler, MD  Chief Complaint: Follow-up  Subjective:   Bob Wilson is a 81 y.o. very pleasant male patient who presents with the following:  Not eating much at all.   Still drinking a lot of Gin. Still drinking pretty heavy.   I asked the patient to come into the office after I received a call from a concert nurse that he was potentially becoming depressed.  I know this patient very well.  He has a history of severe, chronic alcoholism, and he is still drinking 1-2 pints of gin a day at the very least.  He is here with his wife.  He also has atrial fibrillation, and he has decided to stop all of his heart medication.  He basically tells me that he does not want to take it, and he is not interested in taking it at all.  I reviewed that this increases his risk of stroke.  He is mildly depressed, but neither he nor his wife think that he is significantly worse compared to before.  His wife is concerned that he is not eating all that much, which is not that different over the last few years.  Comprehensive Metabolic Panel:    Component Value Date/Time   NA 130* 12/18/2014 1015   K 3.0* 12/18/2014 1015   CL 84* 12/18/2014 1015   CO2 28 12/18/2014 1015   BUN 12 12/18/2014 1015   CREATININE 1.45* 12/18/2014 1015   GLUCOSE 72 12/18/2014 1015   CALCIUM 8.1* 12/18/2014 1015   AST 63* 12/18/2014 1015   ALT 27 12/18/2014 1015   ALKPHOS 164* 12/18/2014 1015   BILITOT 2.4* 12/18/2014 1015   PROT 7.4 12/18/2014 1015   ALBUMIN 3.0* 12/18/2014 1015     Past Medical History, Surgical History, Social History, Family History, Problem List, Medications, and Allergies have been reviewed and updated if relevant.  Patient  Active Problem List   Diagnosis Date Noted  . Protein-calorie malnutrition 08/15/2013    Priority: High  . Cirrhosis, alcoholic 23/76/2831    Priority: High  . 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 Continuous chronic alcoholism 06/25/2011    Priority: High  . Medically noncompliant 06/25/2011    Priority: High  . DEGENERATIVE JOINT DISEASE, KNEE 05/21/2008    Priority: Medium  . Persistent atrial fibrillation 11/05/2014  . Exertional dyspnea 09/26/2014  . Bilateral lower extremity edema   . Complicated UTI (urinary tract infection)-indwelling Foley catheter. 09/02/2014  . Hypokalemia 09/02/2014  . Atrial fibrillation with RVR 09/02/2014  . Sepsis secondary to UTI 09/02/2014  . Urinary retention 07/24/2014  . Empyema of pleura 01/05/2012  . Nodule of left lung 01/05/2012  . PROSTATE SPECIFIC ANTIGEN, ELEVATED 08/07/2010  . HYPONATREMIA 07/15/2010  . INGUINAL HERNIA, RIGHT 07/11/2010  . GOUT 02/11/2009  . Essential hypertension 02/11/2009  . Dyslipidemia 05/21/2008  . ANEMIA-NOS 05/21/2008  . GERD 05/21/2008    Past Medical History  Diagnosis Date  . Anemia     NOS  . Arthritis   . Gout   . Hyperlipidemia   . Hypertension   . GERD (gastroesophageal reflux disease)   . Elevated PSA     multiple times, refused urology eval [facility] notes)  .  ETOH abuse   . Medically noncompliant     noncompliant with follow ups, labs.  . Alcoholism 06/25/2011  . Cirrhosis, alcoholic 0/56/9794  . 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 Paroxysmal atrial fibrillation   . Bilateral lower extremity edema     Chronic.  Marland Kitchen History of empyema of pleura 12/2011    s/p VATS decortication  . Dysrhythmia     hx of PAF  . Shortness of breath dyspnea     exertion   . Foley catheter in place   . Numbness     bilat legs from mid thigh down to feet   . Falls frequently     Past Surgical History  Procedure Laterality Date  . Lipoma excision    . Transthoracic  echocardiogram  09/03/2014    Normal overall LV size. EF 55-60% with no regional W. MA. Trivial AI. Mild-moderate MR. Mild R. and LA dilation. Mild to moderate TR. PA pressures ~47 mmHg  . Video assisted thoracoscopy (vats)/empyema  12/2011  . Nm myoview ltd  10/12/14    LOW RISK - NO ISCHEMIA, DIAPHRAGMATIC ATTENUATION, NON-GATED  . Green light laser turp (transurethral resection of prostate N/A 12/21/2014    Procedure: GREEN LIGHT LASER TURP (TRANSURETHRAL RESECTION OF PROSTATE;  Surgeon: Ardis Hughs, MD;  Location: [facility];  Service: Urology;  Laterality: N/A;    History   Social History  . Marital Status: Married    Spouse Name: N/A  . Number of Children: 2  . Years of Education: N/A   Occupational History  . former Engineer, building services     retired   Social History Main Topics  . Smoking status: Former Smoker -- 0.25 packs/day for 29 years    Types: Cigarettes    Quit date: 07/20/1974  . Smokeless tobacco: Never Used     Comment: Remote- quit 45 years ago.  . Alcohol Use: 0.0 oz/week    0 Standard drinks or equivalent per week     Comment: states that he drinks "couple pints of gin" every day.  nnow has only had maybe one shot of June in the last few weeks.  . Drug Use: No  . Sexual Activity: Not Currently   Other Topics Concern  . Not on file   Social History Narrative   He does not exercise much because of unsteady gait. He does have a walk and use range of motion at least 2 days a week with physical therapy.    No family history on file.  Allergies  Allergen Reactions  . Other     NO BLOOD PRODUCTS(see blood/blood product refusal consent) pt in agreement with albumin   . Ace Inhibitors Other (See Comments)    REACTION: Angioedema  . Aspirin Other (See Comments)    Nose bleeds   . Lisinopril Swelling and Other (See Comments)    REACTION: Facial Swelling  . Rocephin [Ceftriaxone Sodium In Dextrose] Hives    Previously tolerated PCN and Ceph 2/14 Hives with  ceftriaxone dose 2/15 No reaction with benadryl/ceftriaxone 2/16 Tolerating Zosyn w/o reaction    Medication list reviewed and updated in full in [place].   GEN: No acute illnesses, no fevers, chills. GI: No n/v/d, eating normally Pulm: No SOB Interactive and getting along well at home.  Otherwise, ROS is as per the HPI.  Objective:   BP 114/72 mmHg  Pulse 83  Temp(Src) 97.6 F (36.4 C) (Oral)  SpO2 98%  GEN: WDWN, NAD, Non-toxic, A &  O x 3 HEENT: Atraumatic, Normocephalic. Neck supple. No masses, No LAD. Ears and Nose: No external deformity. CV: irreg, irreg PULM: CTA B, no wheezes, crackles, rhonchi. No retractions. No resp. distress. No accessory muscle use. EXTR: No c/c/1+ LE edema NEURO Normal gait.  PSYCH: Normally interactive. Conversant. Not depressed or anxious appearing.  Calm demeanor.   Laboratory and Imaging Data:  Assessment and Plan:   Protein-calorie malnutrition  Medically noncompliant  Continuous chronic alcoholism  Alcoholic cirrhosis of liver without ascites  He is essentially stable with severe alcoholism and noncompliance.  He is stopped all of his medications except for Lasix, and I reviewed some of those risks including stroke and death.  He tells me that he basically does not care, and he does not want to take medication.  He denies being frankly depressed.  I encouraged him to eat whatever he wanted, and to try to at least get outside to his porch and to talk to others.  Signed,  Maud Deed. Khiana Camino, MD   Patient's Medications  New Prescriptions   No medications on file  Previous Medications   ACETAMINOPHEN-CODEINE (TYLENOL #3) 300-30 MG PER TABLET    Take 1 tablet by mouth every 4 (four) hours as needed.   COLCHICINE 0.6 MG TABLET    take 1 tablet by mouth twice a day if needed   DILTIAZEM (CARDIZEM CD) 240 MG 24 HR CAPSULE    Take 1 capsule (240 mg total) by mouth daily.   FUROSEMIDE (LASIX) 40 MG TABLET    Take 1  tablet (40 mg total) by mouth daily.  Modified Medications   No medications on file  Discontinued Medications   No medications on file

## 2015-01-17 DIAGNOSIS — N390 Urinary tract infection, site not specified: Secondary | ICD-10-CM | POA: Diagnosis not present

## 2015-01-17 DIAGNOSIS — R339 Retention of urine, unspecified: Secondary | ICD-10-CM | POA: Diagnosis not present

## 2015-01-22 DIAGNOSIS — L03115 Cellulitis of right lower limb: Secondary | ICD-10-CM | POA: Diagnosis not present

## 2015-01-22 DIAGNOSIS — M6281 Muscle weakness (generalized): Secondary | ICD-10-CM | POA: Diagnosis not present

## 2015-01-22 DIAGNOSIS — I4891 Unspecified atrial fibrillation: Secondary | ICD-10-CM | POA: Diagnosis not present

## 2015-01-22 DIAGNOSIS — K219 Gastro-esophageal reflux disease without esophagitis: Secondary | ICD-10-CM | POA: Diagnosis not present

## 2015-01-22 DIAGNOSIS — L03116 Cellulitis of left lower limb: Secondary | ICD-10-CM | POA: Diagnosis not present

## 2015-01-22 DIAGNOSIS — K7030 Alcoholic cirrhosis of liver without ascites: Secondary | ICD-10-CM | POA: Diagnosis not present

## 2015-01-22 DIAGNOSIS — I10 Essential (primary) hypertension: Secondary | ICD-10-CM | POA: Diagnosis not present

## 2015-01-22 DIAGNOSIS — F1020 Alcohol dependence, uncomplicated: Secondary | ICD-10-CM | POA: Diagnosis not present

## 2015-01-29 DIAGNOSIS — K219 Gastro-esophageal reflux disease without esophagitis: Secondary | ICD-10-CM | POA: Diagnosis not present

## 2015-01-29 DIAGNOSIS — F1020 Alcohol dependence, uncomplicated: Secondary | ICD-10-CM | POA: Diagnosis not present

## 2015-01-29 DIAGNOSIS — K7030 Alcoholic cirrhosis of liver without ascites: Secondary | ICD-10-CM | POA: Diagnosis not present

## 2015-01-29 DIAGNOSIS — L03116 Cellulitis of left lower limb: Secondary | ICD-10-CM | POA: Diagnosis not present

## 2015-01-29 DIAGNOSIS — M6281 Muscle weakness (generalized): Secondary | ICD-10-CM | POA: Diagnosis not present

## 2015-01-29 DIAGNOSIS — I10 Essential (primary) hypertension: Secondary | ICD-10-CM | POA: Diagnosis not present

## 2015-01-29 DIAGNOSIS — L03115 Cellulitis of right lower limb: Secondary | ICD-10-CM | POA: Diagnosis not present

## 2015-01-29 DIAGNOSIS — I4891 Unspecified atrial fibrillation: Secondary | ICD-10-CM | POA: Diagnosis not present

## 2015-02-06 DIAGNOSIS — F1020 Alcohol dependence, uncomplicated: Secondary | ICD-10-CM | POA: Diagnosis not present

## 2015-02-06 DIAGNOSIS — I4891 Unspecified atrial fibrillation: Secondary | ICD-10-CM | POA: Diagnosis not present

## 2015-02-06 DIAGNOSIS — L03115 Cellulitis of right lower limb: Secondary | ICD-10-CM | POA: Diagnosis not present

## 2015-02-06 DIAGNOSIS — K219 Gastro-esophageal reflux disease without esophagitis: Secondary | ICD-10-CM | POA: Diagnosis not present

## 2015-02-06 DIAGNOSIS — M6281 Muscle weakness (generalized): Secondary | ICD-10-CM | POA: Diagnosis not present

## 2015-02-06 DIAGNOSIS — L03116 Cellulitis of left lower limb: Secondary | ICD-10-CM | POA: Diagnosis not present

## 2015-02-06 DIAGNOSIS — K7030 Alcoholic cirrhosis of liver without ascites: Secondary | ICD-10-CM | POA: Diagnosis not present

## 2015-02-06 DIAGNOSIS — I10 Essential (primary) hypertension: Secondary | ICD-10-CM | POA: Diagnosis not present

## 2015-02-12 DIAGNOSIS — L03116 Cellulitis of left lower limb: Secondary | ICD-10-CM | POA: Diagnosis not present

## 2015-02-12 DIAGNOSIS — I10 Essential (primary) hypertension: Secondary | ICD-10-CM | POA: Diagnosis not present

## 2015-02-12 DIAGNOSIS — M6281 Muscle weakness (generalized): Secondary | ICD-10-CM | POA: Diagnosis not present

## 2015-02-12 DIAGNOSIS — L03115 Cellulitis of right lower limb: Secondary | ICD-10-CM | POA: Diagnosis not present

## 2015-02-12 DIAGNOSIS — K219 Gastro-esophageal reflux disease without esophagitis: Secondary | ICD-10-CM | POA: Diagnosis not present

## 2015-02-12 DIAGNOSIS — I4891 Unspecified atrial fibrillation: Secondary | ICD-10-CM | POA: Diagnosis not present

## 2015-02-12 DIAGNOSIS — F1020 Alcohol dependence, uncomplicated: Secondary | ICD-10-CM | POA: Diagnosis not present

## 2015-02-12 DIAGNOSIS — K7030 Alcoholic cirrhosis of liver without ascites: Secondary | ICD-10-CM | POA: Diagnosis not present

## 2015-02-21 DIAGNOSIS — I10 Essential (primary) hypertension: Secondary | ICD-10-CM | POA: Diagnosis not present

## 2015-02-21 DIAGNOSIS — L03116 Cellulitis of left lower limb: Secondary | ICD-10-CM | POA: Diagnosis not present

## 2015-02-21 DIAGNOSIS — K219 Gastro-esophageal reflux disease without esophagitis: Secondary | ICD-10-CM | POA: Diagnosis not present

## 2015-02-21 DIAGNOSIS — I4891 Unspecified atrial fibrillation: Secondary | ICD-10-CM | POA: Diagnosis not present

## 2015-02-21 DIAGNOSIS — F1020 Alcohol dependence, uncomplicated: Secondary | ICD-10-CM | POA: Diagnosis not present

## 2015-02-21 DIAGNOSIS — M6281 Muscle weakness (generalized): Secondary | ICD-10-CM | POA: Diagnosis not present

## 2015-02-21 DIAGNOSIS — L03115 Cellulitis of right lower limb: Secondary | ICD-10-CM | POA: Diagnosis not present

## 2015-02-21 DIAGNOSIS — K7030 Alcoholic cirrhosis of liver without ascites: Secondary | ICD-10-CM | POA: Diagnosis not present

## 2015-02-27 DIAGNOSIS — R338 Other retention of urine: Secondary | ICD-10-CM | POA: Diagnosis not present

## 2015-02-27 DIAGNOSIS — N390 Urinary tract infection, site not specified: Secondary | ICD-10-CM | POA: Diagnosis not present

## 2015-02-27 DIAGNOSIS — N3942 Incontinence without sensory awareness: Secondary | ICD-10-CM | POA: Diagnosis not present

## 2015-02-28 ENCOUNTER — Telehealth: Payer: Self-pay | Admitting: Family Medicine

## 2015-02-28 DIAGNOSIS — M6281 Muscle weakness (generalized): Secondary | ICD-10-CM | POA: Diagnosis not present

## 2015-02-28 DIAGNOSIS — K7030 Alcoholic cirrhosis of liver without ascites: Secondary | ICD-10-CM | POA: Diagnosis not present

## 2015-02-28 DIAGNOSIS — K219 Gastro-esophageal reflux disease without esophagitis: Secondary | ICD-10-CM | POA: Diagnosis not present

## 2015-02-28 DIAGNOSIS — F1020 Alcohol dependence, uncomplicated: Secondary | ICD-10-CM | POA: Diagnosis not present

## 2015-02-28 DIAGNOSIS — I4891 Unspecified atrial fibrillation: Secondary | ICD-10-CM | POA: Diagnosis not present

## 2015-02-28 DIAGNOSIS — L03116 Cellulitis of left lower limb: Secondary | ICD-10-CM | POA: Diagnosis not present

## 2015-02-28 DIAGNOSIS — I10 Essential (primary) hypertension: Secondary | ICD-10-CM | POA: Diagnosis not present

## 2015-02-28 DIAGNOSIS — L03115 Cellulitis of right lower limb: Secondary | ICD-10-CM | POA: Diagnosis not present

## 2015-02-28 NOTE — Telephone Encounter (Signed)
Olivia Mackie [facility] called requesting orders for physical therapist and medical social worker  cb num [phone]

## 2015-03-01 NOTE — Telephone Encounter (Signed)
i do not understand this message.   See exactly what [facility] would need

## 2015-03-01 NOTE — Telephone Encounter (Signed)
Spoke to Volente who states that the pts wife is wanting him to have PT at home as he is not "getting around as good he used to." He is also wanting a Education officer, museum to the home to discuss him possibly receiving Medicaid

## 2015-03-02 DIAGNOSIS — K7030 Alcoholic cirrhosis of liver without ascites: Secondary | ICD-10-CM | POA: Diagnosis not present

## 2015-03-02 DIAGNOSIS — M6281 Muscle weakness (generalized): Secondary | ICD-10-CM | POA: Diagnosis not present

## 2015-03-02 DIAGNOSIS — I10 Essential (primary) hypertension: Secondary | ICD-10-CM | POA: Diagnosis not present

## 2015-03-02 DIAGNOSIS — L03116 Cellulitis of left lower limb: Secondary | ICD-10-CM | POA: Diagnosis not present

## 2015-03-02 DIAGNOSIS — K219 Gastro-esophageal reflux disease without esophagitis: Secondary | ICD-10-CM | POA: Diagnosis not present

## 2015-03-02 DIAGNOSIS — L03115 Cellulitis of right lower limb: Secondary | ICD-10-CM | POA: Diagnosis not present

## 2015-03-02 DIAGNOSIS — F1020 Alcohol dependence, uncomplicated: Secondary | ICD-10-CM | POA: Diagnosis not present

## 2015-03-02 DIAGNOSIS — I4891 Unspecified atrial fibrillation: Secondary | ICD-10-CM | POA: Diagnosis not present

## 2015-03-03 NOTE — Telephone Encounter (Signed)
Butch Penny,   Can you call them and check if his home health order is still open and valid?  If so, please ask for social work and physical therapy consult to assess this patient.

## 2015-03-04 DIAGNOSIS — N312 Flaccid neuropathic bladder, not elsewhere classified: Secondary | ICD-10-CM | POA: Diagnosis not present

## 2015-03-04 DIAGNOSIS — M6281 Muscle weakness (generalized): Secondary | ICD-10-CM | POA: Diagnosis not present

## 2015-03-04 DIAGNOSIS — L03116 Cellulitis of left lower limb: Secondary | ICD-10-CM | POA: Diagnosis not present

## 2015-03-04 NOTE — Telephone Encounter (Signed)
Verbal order given to Olivia Mackie with [facility] for PT and Medical Education officer, museum.

## 2015-03-04 NOTE — Telephone Encounter (Signed)
Left message for Bob Wilson wit [facility] to return my call.

## 2015-03-05 ENCOUNTER — Telehealth: Payer: Self-pay | Admitting: Family Medicine

## 2015-03-05 DIAGNOSIS — I4891 Unspecified atrial fibrillation: Secondary | ICD-10-CM | POA: Diagnosis not present

## 2015-03-05 DIAGNOSIS — K219 Gastro-esophageal reflux disease without esophagitis: Secondary | ICD-10-CM | POA: Diagnosis not present

## 2015-03-05 DIAGNOSIS — L03115 Cellulitis of right lower limb: Secondary | ICD-10-CM | POA: Diagnosis not present

## 2015-03-05 DIAGNOSIS — K7030 Alcoholic cirrhosis of liver without ascites: Secondary | ICD-10-CM | POA: Diagnosis not present

## 2015-03-05 DIAGNOSIS — F1020 Alcohol dependence, uncomplicated: Secondary | ICD-10-CM | POA: Diagnosis not present

## 2015-03-05 DIAGNOSIS — L03116 Cellulitis of left lower limb: Secondary | ICD-10-CM | POA: Diagnosis not present

## 2015-03-05 DIAGNOSIS — M6281 Muscle weakness (generalized): Secondary | ICD-10-CM | POA: Diagnosis not present

## 2015-03-05 DIAGNOSIS — I10 Essential (primary) hypertension: Secondary | ICD-10-CM | POA: Diagnosis not present

## 2015-03-05 NOTE — Telephone Encounter (Signed)
[name] called -0  Needs verbal order to recert him for nursing for another 9 weeks.   Pt has 2 stage 2 ulcers on buttocks, fairly small, needs order to treat those. They are causing a lot of discomfort. Cb number is [phone]

## 2015-03-05 NOTE — Telephone Encounter (Signed)
Verbal orders given to Matthews with [facility] to recert Mr. Robello for another 9 weeks of nursing and wound care.

## 2015-03-07 DIAGNOSIS — M6281 Muscle weakness (generalized): Secondary | ICD-10-CM | POA: Diagnosis not present

## 2015-03-07 DIAGNOSIS — K219 Gastro-esophageal reflux disease without esophagitis: Secondary | ICD-10-CM | POA: Diagnosis not present

## 2015-03-07 DIAGNOSIS — L03116 Cellulitis of left lower limb: Secondary | ICD-10-CM | POA: Diagnosis not present

## 2015-03-07 DIAGNOSIS — K7030 Alcoholic cirrhosis of liver without ascites: Secondary | ICD-10-CM | POA: Diagnosis not present

## 2015-03-07 DIAGNOSIS — F1020 Alcohol dependence, uncomplicated: Secondary | ICD-10-CM | POA: Diagnosis not present

## 2015-03-07 DIAGNOSIS — I4891 Unspecified atrial fibrillation: Secondary | ICD-10-CM | POA: Diagnosis not present

## 2015-03-07 DIAGNOSIS — I10 Essential (primary) hypertension: Secondary | ICD-10-CM | POA: Diagnosis not present

## 2015-03-07 DIAGNOSIS — L03115 Cellulitis of right lower limb: Secondary | ICD-10-CM | POA: Diagnosis not present

## 2015-03-08 DIAGNOSIS — F1020 Alcohol dependence, uncomplicated: Secondary | ICD-10-CM | POA: Diagnosis not present

## 2015-03-08 DIAGNOSIS — M6281 Muscle weakness (generalized): Secondary | ICD-10-CM | POA: Diagnosis not present

## 2015-03-08 DIAGNOSIS — I4891 Unspecified atrial fibrillation: Secondary | ICD-10-CM | POA: Diagnosis not present

## 2015-03-08 DIAGNOSIS — I10 Essential (primary) hypertension: Secondary | ICD-10-CM | POA: Diagnosis not present

## 2015-03-08 DIAGNOSIS — L03116 Cellulitis of left lower limb: Secondary | ICD-10-CM | POA: Diagnosis not present

## 2015-03-08 DIAGNOSIS — K7030 Alcoholic cirrhosis of liver without ascites: Secondary | ICD-10-CM | POA: Diagnosis not present

## 2015-03-08 DIAGNOSIS — L03115 Cellulitis of right lower limb: Secondary | ICD-10-CM | POA: Diagnosis not present

## 2015-03-08 DIAGNOSIS — K219 Gastro-esophageal reflux disease without esophagitis: Secondary | ICD-10-CM | POA: Diagnosis not present

## 2015-03-11 DIAGNOSIS — K219 Gastro-esophageal reflux disease without esophagitis: Secondary | ICD-10-CM | POA: Diagnosis not present

## 2015-03-11 DIAGNOSIS — I10 Essential (primary) hypertension: Secondary | ICD-10-CM | POA: Diagnosis not present

## 2015-03-11 DIAGNOSIS — K7030 Alcoholic cirrhosis of liver without ascites: Secondary | ICD-10-CM | POA: Diagnosis not present

## 2015-03-11 DIAGNOSIS — M6281 Muscle weakness (generalized): Secondary | ICD-10-CM | POA: Diagnosis not present

## 2015-03-11 DIAGNOSIS — F1020 Alcohol dependence, uncomplicated: Secondary | ICD-10-CM | POA: Diagnosis not present

## 2015-03-11 DIAGNOSIS — L03116 Cellulitis of left lower limb: Secondary | ICD-10-CM | POA: Diagnosis not present

## 2015-03-11 DIAGNOSIS — I4891 Unspecified atrial fibrillation: Secondary | ICD-10-CM | POA: Diagnosis not present

## 2015-03-11 DIAGNOSIS — L03115 Cellulitis of right lower limb: Secondary | ICD-10-CM | POA: Diagnosis not present

## 2015-03-12 DIAGNOSIS — M6281 Muscle weakness (generalized): Secondary | ICD-10-CM | POA: Diagnosis not present

## 2015-03-12 DIAGNOSIS — F1020 Alcohol dependence, uncomplicated: Secondary | ICD-10-CM | POA: Diagnosis not present

## 2015-03-12 DIAGNOSIS — I10 Essential (primary) hypertension: Secondary | ICD-10-CM | POA: Diagnosis not present

## 2015-03-12 DIAGNOSIS — K7030 Alcoholic cirrhosis of liver without ascites: Secondary | ICD-10-CM | POA: Diagnosis not present

## 2015-03-12 DIAGNOSIS — L03115 Cellulitis of right lower limb: Secondary | ICD-10-CM | POA: Diagnosis not present

## 2015-03-12 DIAGNOSIS — I4891 Unspecified atrial fibrillation: Secondary | ICD-10-CM | POA: Diagnosis not present

## 2015-03-12 DIAGNOSIS — L03116 Cellulitis of left lower limb: Secondary | ICD-10-CM | POA: Diagnosis not present

## 2015-03-12 DIAGNOSIS — K219 Gastro-esophageal reflux disease without esophagitis: Secondary | ICD-10-CM | POA: Diagnosis not present

## 2015-03-13 DIAGNOSIS — K7030 Alcoholic cirrhosis of liver without ascites: Secondary | ICD-10-CM | POA: Diagnosis not present

## 2015-03-13 DIAGNOSIS — N312 Flaccid neuropathic bladder, not elsewhere classified: Secondary | ICD-10-CM | POA: Diagnosis not present

## 2015-03-13 DIAGNOSIS — M6281 Muscle weakness (generalized): Secondary | ICD-10-CM | POA: Diagnosis not present

## 2015-03-13 DIAGNOSIS — L03115 Cellulitis of right lower limb: Secondary | ICD-10-CM | POA: Diagnosis not present

## 2015-03-13 DIAGNOSIS — I4891 Unspecified atrial fibrillation: Secondary | ICD-10-CM | POA: Diagnosis not present

## 2015-03-13 DIAGNOSIS — K219 Gastro-esophageal reflux disease without esophagitis: Secondary | ICD-10-CM | POA: Diagnosis not present

## 2015-03-13 DIAGNOSIS — L03116 Cellulitis of left lower limb: Secondary | ICD-10-CM | POA: Diagnosis not present

## 2015-03-13 DIAGNOSIS — F1020 Alcohol dependence, uncomplicated: Secondary | ICD-10-CM | POA: Diagnosis not present

## 2015-03-13 DIAGNOSIS — I10 Essential (primary) hypertension: Secondary | ICD-10-CM | POA: Diagnosis not present

## 2015-03-15 DIAGNOSIS — L03116 Cellulitis of left lower limb: Secondary | ICD-10-CM | POA: Diagnosis not present

## 2015-03-15 DIAGNOSIS — I10 Essential (primary) hypertension: Secondary | ICD-10-CM | POA: Diagnosis not present

## 2015-03-15 DIAGNOSIS — M6281 Muscle weakness (generalized): Secondary | ICD-10-CM | POA: Diagnosis not present

## 2015-03-15 DIAGNOSIS — F1020 Alcohol dependence, uncomplicated: Secondary | ICD-10-CM | POA: Diagnosis not present

## 2015-03-15 DIAGNOSIS — I4891 Unspecified atrial fibrillation: Secondary | ICD-10-CM | POA: Diagnosis not present

## 2015-03-15 DIAGNOSIS — K219 Gastro-esophageal reflux disease without esophagitis: Secondary | ICD-10-CM | POA: Diagnosis not present

## 2015-03-15 DIAGNOSIS — K7030 Alcoholic cirrhosis of liver without ascites: Secondary | ICD-10-CM | POA: Diagnosis not present

## 2015-03-15 DIAGNOSIS — L03115 Cellulitis of right lower limb: Secondary | ICD-10-CM | POA: Diagnosis not present

## 2015-03-18 DIAGNOSIS — M6281 Muscle weakness (generalized): Secondary | ICD-10-CM | POA: Diagnosis not present

## 2015-03-18 DIAGNOSIS — I10 Essential (primary) hypertension: Secondary | ICD-10-CM | POA: Diagnosis not present

## 2015-03-18 DIAGNOSIS — K7030 Alcoholic cirrhosis of liver without ascites: Secondary | ICD-10-CM | POA: Diagnosis not present

## 2015-03-18 DIAGNOSIS — I4891 Unspecified atrial fibrillation: Secondary | ICD-10-CM | POA: Diagnosis not present

## 2015-03-18 DIAGNOSIS — L03115 Cellulitis of right lower limb: Secondary | ICD-10-CM | POA: Diagnosis not present

## 2015-03-18 DIAGNOSIS — F1020 Alcohol dependence, uncomplicated: Secondary | ICD-10-CM | POA: Diagnosis not present

## 2015-03-18 DIAGNOSIS — K219 Gastro-esophageal reflux disease without esophagitis: Secondary | ICD-10-CM | POA: Diagnosis not present

## 2015-03-18 DIAGNOSIS — L03116 Cellulitis of left lower limb: Secondary | ICD-10-CM | POA: Diagnosis not present

## 2015-03-20 DIAGNOSIS — I4891 Unspecified atrial fibrillation: Secondary | ICD-10-CM | POA: Diagnosis not present

## 2015-03-20 DIAGNOSIS — F1020 Alcohol dependence, uncomplicated: Secondary | ICD-10-CM | POA: Diagnosis not present

## 2015-03-20 DIAGNOSIS — I10 Essential (primary) hypertension: Secondary | ICD-10-CM | POA: Diagnosis not present

## 2015-03-20 DIAGNOSIS — K7030 Alcoholic cirrhosis of liver without ascites: Secondary | ICD-10-CM | POA: Diagnosis not present

## 2015-03-20 DIAGNOSIS — L03116 Cellulitis of left lower limb: Secondary | ICD-10-CM | POA: Diagnosis not present

## 2015-03-20 DIAGNOSIS — K219 Gastro-esophageal reflux disease without esophagitis: Secondary | ICD-10-CM | POA: Diagnosis not present

## 2015-03-20 DIAGNOSIS — L03115 Cellulitis of right lower limb: Secondary | ICD-10-CM | POA: Diagnosis not present

## 2015-03-20 DIAGNOSIS — M6281 Muscle weakness (generalized): Secondary | ICD-10-CM | POA: Diagnosis not present

## 2015-03-22 DIAGNOSIS — K219 Gastro-esophageal reflux disease without esophagitis: Secondary | ICD-10-CM | POA: Diagnosis not present

## 2015-03-22 DIAGNOSIS — I10 Essential (primary) hypertension: Secondary | ICD-10-CM | POA: Diagnosis not present

## 2015-03-22 DIAGNOSIS — L03115 Cellulitis of right lower limb: Secondary | ICD-10-CM | POA: Diagnosis not present

## 2015-03-22 DIAGNOSIS — M6281 Muscle weakness (generalized): Secondary | ICD-10-CM | POA: Diagnosis not present

## 2015-03-22 DIAGNOSIS — I4891 Unspecified atrial fibrillation: Secondary | ICD-10-CM | POA: Diagnosis not present

## 2015-03-22 DIAGNOSIS — K7030 Alcoholic cirrhosis of liver without ascites: Secondary | ICD-10-CM | POA: Diagnosis not present

## 2015-03-22 DIAGNOSIS — L03116 Cellulitis of left lower limb: Secondary | ICD-10-CM | POA: Diagnosis not present

## 2015-03-22 DIAGNOSIS — F1020 Alcohol dependence, uncomplicated: Secondary | ICD-10-CM | POA: Diagnosis not present

## 2015-03-26 DIAGNOSIS — I4891 Unspecified atrial fibrillation: Secondary | ICD-10-CM | POA: Diagnosis not present

## 2015-03-26 DIAGNOSIS — K7030 Alcoholic cirrhosis of liver without ascites: Secondary | ICD-10-CM | POA: Diagnosis not present

## 2015-03-26 DIAGNOSIS — K219 Gastro-esophageal reflux disease without esophagitis: Secondary | ICD-10-CM | POA: Diagnosis not present

## 2015-03-26 DIAGNOSIS — M6281 Muscle weakness (generalized): Secondary | ICD-10-CM | POA: Diagnosis not present

## 2015-03-26 DIAGNOSIS — L03116 Cellulitis of left lower limb: Secondary | ICD-10-CM | POA: Diagnosis not present

## 2015-03-26 DIAGNOSIS — L03115 Cellulitis of right lower limb: Secondary | ICD-10-CM | POA: Diagnosis not present

## 2015-03-26 DIAGNOSIS — F1020 Alcohol dependence, uncomplicated: Secondary | ICD-10-CM | POA: Diagnosis not present

## 2015-03-26 DIAGNOSIS — I10 Essential (primary) hypertension: Secondary | ICD-10-CM | POA: Diagnosis not present

## 2015-03-27 ENCOUNTER — Encounter: Payer: Self-pay | Admitting: Family Medicine

## 2015-03-27 DIAGNOSIS — N312 Flaccid neuropathic bladder, not elsewhere classified: Secondary | ICD-10-CM

## 2015-03-27 HISTORY — DX: Flaccid neuropathic bladder, not elsewhere classified: N31.2

## 2015-03-28 DIAGNOSIS — K7030 Alcoholic cirrhosis of liver without ascites: Secondary | ICD-10-CM | POA: Diagnosis not present

## 2015-03-28 DIAGNOSIS — I4891 Unspecified atrial fibrillation: Secondary | ICD-10-CM | POA: Diagnosis not present

## 2015-03-28 DIAGNOSIS — M6281 Muscle weakness (generalized): Secondary | ICD-10-CM | POA: Diagnosis not present

## 2015-03-28 DIAGNOSIS — F1020 Alcohol dependence, uncomplicated: Secondary | ICD-10-CM | POA: Diagnosis not present

## 2015-03-28 DIAGNOSIS — K219 Gastro-esophageal reflux disease without esophagitis: Secondary | ICD-10-CM | POA: Diagnosis not present

## 2015-03-28 DIAGNOSIS — I10 Essential (primary) hypertension: Secondary | ICD-10-CM | POA: Diagnosis not present

## 2015-03-28 DIAGNOSIS — L03116 Cellulitis of left lower limb: Secondary | ICD-10-CM | POA: Diagnosis not present

## 2015-03-28 DIAGNOSIS — L03115 Cellulitis of right lower limb: Secondary | ICD-10-CM | POA: Diagnosis not present

## 2015-04-01 DIAGNOSIS — M6281 Muscle weakness (generalized): Secondary | ICD-10-CM | POA: Diagnosis not present

## 2015-04-01 DIAGNOSIS — K7030 Alcoholic cirrhosis of liver without ascites: Secondary | ICD-10-CM | POA: Diagnosis not present

## 2015-04-01 DIAGNOSIS — L03115 Cellulitis of right lower limb: Secondary | ICD-10-CM | POA: Diagnosis not present

## 2015-04-01 DIAGNOSIS — K219 Gastro-esophageal reflux disease without esophagitis: Secondary | ICD-10-CM | POA: Diagnosis not present

## 2015-04-01 DIAGNOSIS — L03116 Cellulitis of left lower limb: Secondary | ICD-10-CM | POA: Diagnosis not present

## 2015-04-01 DIAGNOSIS — F1020 Alcohol dependence, uncomplicated: Secondary | ICD-10-CM | POA: Diagnosis not present

## 2015-04-01 DIAGNOSIS — I10 Essential (primary) hypertension: Secondary | ICD-10-CM | POA: Diagnosis not present

## 2015-04-01 DIAGNOSIS — I4891 Unspecified atrial fibrillation: Secondary | ICD-10-CM | POA: Diagnosis not present

## 2015-04-04 DIAGNOSIS — F1020 Alcohol dependence, uncomplicated: Secondary | ICD-10-CM | POA: Diagnosis not present

## 2015-04-04 DIAGNOSIS — I10 Essential (primary) hypertension: Secondary | ICD-10-CM | POA: Diagnosis not present

## 2015-04-04 DIAGNOSIS — K219 Gastro-esophageal reflux disease without esophagitis: Secondary | ICD-10-CM | POA: Diagnosis not present

## 2015-04-04 DIAGNOSIS — M6281 Muscle weakness (generalized): Secondary | ICD-10-CM | POA: Diagnosis not present

## 2015-04-04 DIAGNOSIS — I4891 Unspecified atrial fibrillation: Secondary | ICD-10-CM | POA: Diagnosis not present

## 2015-04-04 DIAGNOSIS — L03115 Cellulitis of right lower limb: Secondary | ICD-10-CM | POA: Diagnosis not present

## 2015-04-04 DIAGNOSIS — L03116 Cellulitis of left lower limb: Secondary | ICD-10-CM | POA: Diagnosis not present

## 2015-04-04 DIAGNOSIS — K7030 Alcoholic cirrhosis of liver without ascites: Secondary | ICD-10-CM | POA: Diagnosis not present

## 2015-04-09 ENCOUNTER — Telehealth: Payer: Self-pay

## 2015-04-09 DIAGNOSIS — L03115 Cellulitis of right lower limb: Secondary | ICD-10-CM | POA: Diagnosis not present

## 2015-04-09 DIAGNOSIS — K7030 Alcoholic cirrhosis of liver without ascites: Secondary | ICD-10-CM | POA: Diagnosis not present

## 2015-04-09 DIAGNOSIS — L03116 Cellulitis of left lower limb: Secondary | ICD-10-CM | POA: Diagnosis not present

## 2015-04-09 DIAGNOSIS — K219 Gastro-esophageal reflux disease without esophagitis: Secondary | ICD-10-CM | POA: Diagnosis not present

## 2015-04-09 DIAGNOSIS — F1020 Alcohol dependence, uncomplicated: Secondary | ICD-10-CM | POA: Diagnosis not present

## 2015-04-09 DIAGNOSIS — I4891 Unspecified atrial fibrillation: Secondary | ICD-10-CM | POA: Diagnosis not present

## 2015-04-09 DIAGNOSIS — M6281 Muscle weakness (generalized): Secondary | ICD-10-CM | POA: Diagnosis not present

## 2015-04-09 DIAGNOSIS — I10 Essential (primary) hypertension: Secondary | ICD-10-CM | POA: Diagnosis not present

## 2015-04-09 NOTE — Telephone Encounter (Signed)
Bob Wilson PT with [facility] left v/m; Patrick Jupiter saw pt earlier today and was informed pt had a fall and has soreness in hip. Pt can move around OK. [place] request cb. Unable to reach [place] by phone. Spoke with pt's wife and pt fell on 04/05/15; Mrs Dollar said rt knee is hurting and hip has been hurting since previous fall one month ago.  Pt is able to walk OK and had done leg lifts today. Mrs Boeve gave pt advil 200 mg OTC taking two tabs and that helped pain somewhat. Mrs Dyar said pt did need appt with Dr Lorelei Pont; scheduled appt with Dr Lorelei Pont on 04/10/15 at 12:15 PM.

## 2015-04-09 NOTE — Telephone Encounter (Signed)
Please see if there is a place where I can have 30+ minutes tomorrow to take care of this trauma in a non-compliant patient.  Thanks Electronically Signed  By: Owens Loffler, MD On: 04/09/2015 4:28 PM

## 2015-04-10 ENCOUNTER — Ambulatory Visit (INDEPENDENT_AMBULATORY_CARE_PROVIDER_SITE_OTHER): Payer: Medicare Other | Admitting: Family Medicine

## 2015-04-10 ENCOUNTER — Ambulatory Visit (INDEPENDENT_AMBULATORY_CARE_PROVIDER_SITE_OTHER)
Admission: RE | Admit: 2015-04-10 | Discharge: 2015-04-10 | Disposition: A | Discharge status: Home / Self Care | Payer: Medicare Other | Source: Ambulatory Visit | Attending: Family Medicine | Admitting: Family Medicine

## 2015-04-10 ENCOUNTER — Encounter: Payer: Self-pay | Admitting: Family Medicine

## 2015-04-10 VITALS — BP 108/70 | HR 74 | Temp 97.5°F | Ht 75.0 in | Wt 164.8 lb

## 2015-04-10 DIAGNOSIS — N312 Flaccid neuropathic bladder, not elsewhere classified: Secondary | ICD-10-CM | POA: Diagnosis not present

## 2015-04-10 DIAGNOSIS — M25551 Pain in right hip: Secondary | ICD-10-CM

## 2015-04-10 DIAGNOSIS — M25561 Pain in right knee: Secondary | ICD-10-CM | POA: Diagnosis not present

## 2015-04-10 DIAGNOSIS — Z960 Presence of urogenital implants: Secondary | ICD-10-CM

## 2015-04-10 DIAGNOSIS — Z978 Presence of other specified devices: Secondary | ICD-10-CM

## 2015-04-10 DIAGNOSIS — M25461 Effusion, right knee: Secondary | ICD-10-CM | POA: Diagnosis not present

## 2015-04-10 DIAGNOSIS — Z9889 Other specified postprocedural states: Secondary | ICD-10-CM

## 2015-04-10 DIAGNOSIS — E46 Unspecified protein-calorie malnutrition: Secondary | ICD-10-CM

## 2015-04-10 MED ORDER — MIRTAZAPINE 15 MG PO TABS
15.0000 mg | ORAL_TABLET | Freq: Every day | ORAL | Status: AC
Start: 2015-04-10 — End: ?

## 2015-04-10 MED ORDER — ACETAMINOPHEN-CODEINE #3 300-30 MG PO TABS: 1.0000 | ORAL_TABLET | Freq: Four times a day (QID) | ORAL | Status: DC | PRN

## 2015-04-10 NOTE — Telephone Encounter (Signed)
Patient has appointment on 04/10/15.

## 2015-04-10 NOTE — Progress Notes (Signed)
Pre visit review using our clinic review tool, if applicable. No additional management support is needed unless otherwise documented below in the visit note. 

## 2015-04-10 NOTE — Progress Notes (Signed)
Dr. Frederico Hamman T. Copland, MD, [facility] Primary [facility] and Sports Medicine [place], [address] Phone: [phone] Fax: [phone]  04/10/2015  Patient: Bob Wilson, MRN: [id number], DOB: 1933-01-25, 81 y.o.  Primary Physician:  Owens Loffler, MD  Chief Complaint: Fall  Subjective:   ADELFO DIEBEL is a 81 y.o. very pleasant male patient who presents with the following:  Elderly 81 year old gentleman who has not been doing well over the last couple of years who has been a chronic alcoholic over many years and also is currently malnourished and has been malnourished for some time.  He continues to lose weight, and he has not been eating much at all per his wife.  Now he is confined to the wheelchair greater than 90% of the time.  He is having difficulty even standing and walking by himself.  He does have physical therapy at his house currently.  He recently fell a few days ago and he is having pain in his knee and his hip.  He also has chronic bladder hypotension, and is been in and out of the hospital with urinary tract infections, urosepsis, and has been seeing urology.  Now he has a long-term indwelling Foley catheter.  Still really weak and legs are not going to hold him, and with reaching to hold him, then cannot do it. Still in the chair most all the time now, > 90%.   Past Medical History, Surgical History, Social History, Family History, Problem List, Medications, and Allergies have been reviewed and updated if relevant.  Patient Active Problem List   Diagnosis Date Noted  . Protein-calorie malnutrition 08/15/2013    Priority: High  . Cirrhosis, alcoholic 62/22/9798    Priority: High  . 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 Continuous chronic alcoholism 06/25/2011    Priority: High  . Medically noncompliant 06/25/2011    Priority: High  . DEGENERATIVE JOINT DISEASE, KNEE 05/21/2008    Priority: Medium  . Hypotonic  bladder 03/27/2015  . Persistent atrial fibrillation 11/05/2014  . Bilateral lower extremity edema   . Complicated UTI (urinary tract infection)-indwelling Foley catheter. 09/02/2014  . Hypokalemia 09/02/2014  . Atrial fibrillation with RVR 09/02/2014  . Sepsis secondary to UTI 09/02/2014  . Urinary retention 07/24/2014  . Empyema of pleura 01/05/2012  . Nodule of left lung 01/05/2012  . PROSTATE SPECIFIC ANTIGEN, ELEVATED 08/07/2010  . HYPONATREMIA 07/15/2010  . INGUINAL HERNIA, RIGHT 07/11/2010  . GOUT 02/11/2009  . Essential hypertension 02/11/2009  . Dyslipidemia 05/21/2008  . ANEMIA-NOS 05/21/2008  . GERD 05/21/2008    Past Medical History  Diagnosis Date  . Anemia     NOS  . Arthritis   . Gout   . Hyperlipidemia   . Hypertension   . GERD (gastroesophageal reflux disease)   . Elevated PSA     multiple times, refused urology eval [facility] notes)  . ETOH abuse   . Medically noncompliant     noncompliant with follow ups, labs.  . Alcoholism 06/25/2011  . Cirrhosis, alcoholic 04/09/1940  . 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 Paroxysmal atrial fibrillation   . Bilateral lower extremity edema     Chronic.  Marland Kitchen History of empyema of pleura 12/2011    s/p VATS decortication  . Dysrhythmia     hx of PAF  . Foley catheter in place   . Falls frequently   . Hypotonic bladder 03/27/2015    Past  Surgical History  Procedure Laterality Date  . Lipoma excision    . Transthoracic echocardiogram  09/03/2014    Normal overall LV size. EF 55-60% with no regional W. MA. Trivial AI. Mild-moderate MR. Mild R. and LA dilation. Mild to moderate TR. PA pressures ~47 mmHg  . Video assisted thoracoscopy (vats)/empyema  12/2011  . Nm myoview ltd  10/12/14    LOW RISK - NO ISCHEMIA, DIAPHRAGMATIC ATTENUATION, NON-GATED  . Green light laser turp (transurethral resection of prostate N/A 12/21/2014    Procedure: GREEN LIGHT LASER TURP (TRANSURETHRAL RESECTION OF PROSTATE;  Surgeon: Ardis Hughs, MD;  Location: [facility];  Service: Urology;  Laterality: N/A;    Social History   Social History  . Marital Status: Married    Spouse Name: N/A  . Number of Children: 2  . Years of Education: N/A   Occupational History  . former Engineer, building services     retired   Social History Main Topics  . Smoking status: Former Smoker -- 0.25 packs/day for 29 years    Types: Cigarettes    Quit date: 07/20/1974  . Smokeless tobacco: Never Used     Comment: Remote- quit 45 years ago.  . Alcohol Use: 0.0 oz/week    0 Standard drinks or equivalent per week     Comment: states that he drinks "couple pints of gin" every day.  nnow has only had maybe one shot of June in the last few weeks.  . Drug Use: No  . Sexual Activity: Not Currently   Other Topics Concern  . Not on file   Social History Narrative   Chronic Alcoholic, severe   J[employer] Witness      He does not exercise much because of unsteady gait. He does have a walk and use range of motion at least 2 days a week with physical therapy.    No family history on file.  Allergies  Allergen Reactions  . Other     NO BLOOD PRODUCTS(see blood/blood product refusal consent) pt in agreement with albumin   . Ace Inhibitors Other (See Comments)    REACTION: Angioedema  . Aspirin Other (See Comments)    Nose bleeds   . Lisinopril Swelling and Other (See Comments)    REACTION: Facial Swelling  . Rocephin [Ceftriaxone Sodium In Dextrose] Hives    Previously tolerated PCN and Ceph 2/14 Hives with ceftriaxone dose 2/15 No reaction with benadryl/ceftriaxone 2/16 Tolerating Zosyn w/o reaction    Medication list reviewed and updated in full in [place].  GEN: No fevers, chills. Nontoxic. Primarily MSK c/o today. MSK: Detailed in the HPI GI: tolerating PO intake without difficulty Neuro: No numbness, parasthesias, or tingling associated. Otherwise the pertinent positives of the ROS are noted above.   Objective:   BP 108/70  mmHg  Pulse 74  Temp(Src) 97.5 F (36.4 C) (Oral)  Ht '6\' 3"'$  (1.905 m)  Wt 164 lb 12 oz (74.73 kg)  BMI 20.59 kg/m2  SpO2 98%   GEN: WDWN, NAD, Non-toxic, A & O x 3 HEENT: Atraumatic, Normocephalic. Neck supple. No masses, No LAD. Ears and Nose: No external deformity. EXTR: No c/c/ 2 + LE edema NEURO wheelchair bound PSYCH: Normally interactive. Conversant. Not depressed or anxious appearing.  Calm demeanor.    Currently wheelchair bound, exam extremely difficult.  Hip and pelvis.  Nontender throughout bony palpation. Entire pelvis was explored topicall  Patient does have some lateral hip tenderness around the greater trochanter.  Does  have some restriction with rotational movements of the hip on the right.  Right knee: Mildly tender along the tibial tubercle tibial plateau with mild joint line tenderness medially and  nontender at the patella.  Stable MCL and LCL.  Drawer testing is negative.  Grossly nontender in other anatomical locations  Radiology: Dg Knee Complete 4 Views Right  04/10/2015   CLINICAL DATA:  Right knee pain after falling 3 times yesterday.  EXAM: RIGHT KNEE - COMPLETE 4+ VIEW  COMPARISON:  None.  FINDINGS: There is no visible fracture or dislocation. There is a prominent joint effusion as well as soft tissue prominence in the region of the distal quadriceps tendon. The possibility of a partial quadriceps tendon rupture should be considered.  Moderate arthritis of the patellofemoral compartment. Slight medial joint space narrowing. Soft tissue edema around the knee.  IMPRESSION: 1. No acute osseous abnormality. 2. Joint effusion. 3. Soft tissue swelling with possible injury to the distal quadriceps tendon.   Electronically Signed   By: Lorriane Shire M.D.   On: 04/10/2015 15:21   Dg Hip Unilat With Pelvis 2-3 Views Right  04/10/2015   CLINICAL DATA:  Right hip pain after falling 3 times yesterday.  EXAM: DG HIP (WITH OR WITHOUT PELVIS) 2-3V RIGHT  COMPARISON:  CT scan  of the abdomen and pelvis dated 12/30/2011  FINDINGS: There is no evidence of hip fracture or dislocation. There is no evidence of arthropathy or other focal bone abnormality.  IMPRESSION: Negative.   Electronically Signed   By: Lorriane Shire M.D.   On: 04/10/2015 15:10     Assessment and Plan:   Protein-calorie malnutrition  Right knee pain - Plan: DG Knee Complete 4 Views Right  Right hip pain - Plan: DG HIP UNILAT WITH PELVIS 2-3 VIEWS RIGHT  Hypotonic bladder  Chronic indwelling Foley catheter  I am more concerned with how he is doing globally. Extremely difficult to get out of the wheelchair. Not eating well, declining overall. Having PT - he will have to work if he ever can get out of his chair long-term.  Not eating, poor nutrition and malnourished. Eat at all costs - anything he wants.  Add remeron at night.   Tylenol #3 for pain.  New Prescriptions   ACETAMINOPHEN-CODEINE (TYLENOL #3) 300-30 MG PER TABLET    Take 1 tablet by mouth every 6 (six) hours as needed for moderate pain.   MIRTAZAPINE (REMERON) 15 MG TABLET    Take 1 tablet (15 mg total) by mouth at bedtime.   Orders Placed This Encounter  Procedures  . DG Knee Complete 4 Views Right  . DG HIP UNILAT WITH PELVIS 2-3 VIEWS RIGHT    Signed,  Spencer T. Copland, MD   Patient's Medications  New Prescriptions   ACETAMINOPHEN-CODEINE (TYLENOL #3) 300-30 MG PER TABLET    Take 1 tablet by mouth every 6 (six) hours as needed for moderate pain.   MIRTAZAPINE (REMERON) 15 MG TABLET    Take 1 tablet (15 mg total) by mouth at bedtime.  Previous Medications   COLCHICINE 0.6 MG TABLET    take 1 tablet by mouth twice a day if needed   DILTIAZEM (CARDIZEM CD) 240 MG 24 HR CAPSULE    Take 1 capsule (240 mg total) by mouth daily.   FUROSEMIDE (LASIX) 40 MG TABLET    Take 1 tablet (40 mg total) by mouth daily.  Modified Medications   No medications on file  Discontinued Medications   ACETAMINOPHEN-CODEINE (  TYLENOL #3)  300-30 MG PER TABLET    Take 1 tablet by mouth every 4 (four) hours as needed.

## 2015-04-11 DIAGNOSIS — I4891 Unspecified atrial fibrillation: Secondary | ICD-10-CM | POA: Diagnosis not present

## 2015-04-11 DIAGNOSIS — F1020 Alcohol dependence, uncomplicated: Secondary | ICD-10-CM | POA: Diagnosis not present

## 2015-04-11 DIAGNOSIS — K219 Gastro-esophageal reflux disease without esophagitis: Secondary | ICD-10-CM | POA: Diagnosis not present

## 2015-04-11 DIAGNOSIS — I10 Essential (primary) hypertension: Secondary | ICD-10-CM | POA: Diagnosis not present

## 2015-04-11 DIAGNOSIS — M6281 Muscle weakness (generalized): Secondary | ICD-10-CM | POA: Diagnosis not present

## 2015-04-11 DIAGNOSIS — L03116 Cellulitis of left lower limb: Secondary | ICD-10-CM | POA: Diagnosis not present

## 2015-04-11 DIAGNOSIS — K7030 Alcoholic cirrhosis of liver without ascites: Secondary | ICD-10-CM | POA: Diagnosis not present

## 2015-04-11 DIAGNOSIS — L03115 Cellulitis of right lower limb: Secondary | ICD-10-CM | POA: Diagnosis not present

## 2015-04-12 ENCOUNTER — Encounter: Payer: Self-pay | Admitting: Family Medicine

## 2015-04-12 DIAGNOSIS — Z978 Presence of other specified devices: Secondary | ICD-10-CM

## 2015-04-12 DIAGNOSIS — Z960 Presence of urogenital implants: Secondary | ICD-10-CM

## 2015-04-12 HISTORY — DX: Presence of other specified devices: Z97.8

## 2015-04-15 DIAGNOSIS — N312 Flaccid neuropathic bladder, not elsewhere classified: Secondary | ICD-10-CM | POA: Diagnosis not present

## 2015-04-16 ENCOUNTER — Telehealth: Payer: Self-pay | Admitting: *Deleted

## 2015-04-16 DIAGNOSIS — L03116 Cellulitis of left lower limb: Secondary | ICD-10-CM | POA: Diagnosis not present

## 2015-04-16 DIAGNOSIS — K219 Gastro-esophageal reflux disease without esophagitis: Secondary | ICD-10-CM | POA: Diagnosis not present

## 2015-04-16 DIAGNOSIS — L03115 Cellulitis of right lower limb: Secondary | ICD-10-CM | POA: Diagnosis not present

## 2015-04-16 DIAGNOSIS — M6281 Muscle weakness (generalized): Secondary | ICD-10-CM | POA: Diagnosis not present

## 2015-04-16 DIAGNOSIS — I10 Essential (primary) hypertension: Secondary | ICD-10-CM | POA: Diagnosis not present

## 2015-04-16 DIAGNOSIS — F1020 Alcohol dependence, uncomplicated: Secondary | ICD-10-CM | POA: Diagnosis not present

## 2015-04-16 DIAGNOSIS — K7030 Alcoholic cirrhosis of liver without ascites: Secondary | ICD-10-CM | POA: Diagnosis not present

## 2015-04-16 DIAGNOSIS — I4891 Unspecified atrial fibrillation: Secondary | ICD-10-CM | POA: Diagnosis not present

## 2015-04-16 NOTE — Telephone Encounter (Signed)
Lenell Antu, physical therapist with [facility] called with update.  Mr. Cefalu has been receiving PT in the home x3 weeks and is still not at goal.  Marzetta Board is requesting verbal order to extend PT for an additional 3 weeks.  Please advise.

## 2015-04-17 NOTE — Telephone Encounter (Signed)
This is a good idea. i saw him last week.

## 2015-04-17 NOTE — Telephone Encounter (Signed)
Verbal orders given to [facility] with [facility] to extend PT for three more weeks per Dr. Lorelei Pont.

## 2015-04-17 NOTE — Telephone Encounter (Signed)
Bob Wilson,  Is there a phone number that I can call Bob Wilson back at?

## 2015-04-18 DIAGNOSIS — I10 Essential (primary) hypertension: Secondary | ICD-10-CM | POA: Diagnosis not present

## 2015-04-18 DIAGNOSIS — L03115 Cellulitis of right lower limb: Secondary | ICD-10-CM | POA: Diagnosis not present

## 2015-04-18 DIAGNOSIS — I4891 Unspecified atrial fibrillation: Secondary | ICD-10-CM | POA: Diagnosis not present

## 2015-04-18 DIAGNOSIS — K7030 Alcoholic cirrhosis of liver without ascites: Secondary | ICD-10-CM | POA: Diagnosis not present

## 2015-04-18 DIAGNOSIS — L03116 Cellulitis of left lower limb: Secondary | ICD-10-CM | POA: Diagnosis not present

## 2015-04-18 DIAGNOSIS — K219 Gastro-esophageal reflux disease without esophagitis: Secondary | ICD-10-CM | POA: Diagnosis not present

## 2015-04-18 DIAGNOSIS — F1020 Alcohol dependence, uncomplicated: Secondary | ICD-10-CM | POA: Diagnosis not present

## 2015-04-18 DIAGNOSIS — M6281 Muscle weakness (generalized): Secondary | ICD-10-CM | POA: Diagnosis not present

## 2015-04-19 DIAGNOSIS — I10 Essential (primary) hypertension: Secondary | ICD-10-CM | POA: Diagnosis not present

## 2015-04-19 DIAGNOSIS — I4891 Unspecified atrial fibrillation: Secondary | ICD-10-CM | POA: Diagnosis not present

## 2015-04-19 DIAGNOSIS — L03116 Cellulitis of left lower limb: Secondary | ICD-10-CM | POA: Diagnosis not present

## 2015-04-19 DIAGNOSIS — M6281 Muscle weakness (generalized): Secondary | ICD-10-CM | POA: Diagnosis not present

## 2015-04-19 DIAGNOSIS — K7030 Alcoholic cirrhosis of liver without ascites: Secondary | ICD-10-CM | POA: Diagnosis not present

## 2015-04-19 DIAGNOSIS — K219 Gastro-esophageal reflux disease without esophagitis: Secondary | ICD-10-CM | POA: Diagnosis not present

## 2015-04-19 DIAGNOSIS — L03115 Cellulitis of right lower limb: Secondary | ICD-10-CM | POA: Diagnosis not present

## 2015-04-19 DIAGNOSIS — F1020 Alcohol dependence, uncomplicated: Secondary | ICD-10-CM | POA: Diagnosis not present

## 2015-04-22 DIAGNOSIS — M6281 Muscle weakness (generalized): Secondary | ICD-10-CM | POA: Diagnosis not present

## 2015-04-24 DIAGNOSIS — M6281 Muscle weakness (generalized): Secondary | ICD-10-CM | POA: Diagnosis not present

## 2015-04-24 DIAGNOSIS — L03116 Cellulitis of left lower limb: Secondary | ICD-10-CM | POA: Diagnosis not present

## 2015-04-24 DIAGNOSIS — K219 Gastro-esophageal reflux disease without esophagitis: Secondary | ICD-10-CM | POA: Diagnosis not present

## 2015-04-24 DIAGNOSIS — I4891 Unspecified atrial fibrillation: Secondary | ICD-10-CM | POA: Diagnosis not present

## 2015-04-24 DIAGNOSIS — F1020 Alcohol dependence, uncomplicated: Secondary | ICD-10-CM | POA: Diagnosis not present

## 2015-04-24 DIAGNOSIS — I10 Essential (primary) hypertension: Secondary | ICD-10-CM | POA: Diagnosis not present

## 2015-04-24 DIAGNOSIS — K7030 Alcoholic cirrhosis of liver without ascites: Secondary | ICD-10-CM | POA: Diagnosis not present

## 2015-04-24 DIAGNOSIS — L03115 Cellulitis of right lower limb: Secondary | ICD-10-CM | POA: Diagnosis not present

## 2015-04-25 DIAGNOSIS — F1020 Alcohol dependence, uncomplicated: Secondary | ICD-10-CM | POA: Diagnosis not present

## 2015-04-25 DIAGNOSIS — K7030 Alcoholic cirrhosis of liver without ascites: Secondary | ICD-10-CM | POA: Diagnosis not present

## 2015-04-25 DIAGNOSIS — I10 Essential (primary) hypertension: Secondary | ICD-10-CM | POA: Diagnosis not present

## 2015-04-25 DIAGNOSIS — I4891 Unspecified atrial fibrillation: Secondary | ICD-10-CM | POA: Diagnosis not present

## 2015-04-25 DIAGNOSIS — L03115 Cellulitis of right lower limb: Secondary | ICD-10-CM | POA: Diagnosis not present

## 2015-04-25 DIAGNOSIS — M6281 Muscle weakness (generalized): Secondary | ICD-10-CM | POA: Diagnosis not present

## 2015-04-25 DIAGNOSIS — L03116 Cellulitis of left lower limb: Secondary | ICD-10-CM | POA: Diagnosis not present

## 2015-04-25 DIAGNOSIS — K219 Gastro-esophageal reflux disease without esophagitis: Secondary | ICD-10-CM | POA: Diagnosis not present

## 2015-04-26 DIAGNOSIS — M6281 Muscle weakness (generalized): Secondary | ICD-10-CM | POA: Diagnosis not present

## 2015-04-26 DIAGNOSIS — F1020 Alcohol dependence, uncomplicated: Secondary | ICD-10-CM | POA: Diagnosis not present

## 2015-04-26 DIAGNOSIS — I10 Essential (primary) hypertension: Secondary | ICD-10-CM | POA: Diagnosis not present

## 2015-04-26 DIAGNOSIS — K219 Gastro-esophageal reflux disease without esophagitis: Secondary | ICD-10-CM | POA: Diagnosis not present

## 2015-04-26 DIAGNOSIS — L03115 Cellulitis of right lower limb: Secondary | ICD-10-CM | POA: Diagnosis not present

## 2015-04-26 DIAGNOSIS — K7030 Alcoholic cirrhosis of liver without ascites: Secondary | ICD-10-CM | POA: Diagnosis not present

## 2015-04-26 DIAGNOSIS — I4891 Unspecified atrial fibrillation: Secondary | ICD-10-CM | POA: Diagnosis not present

## 2015-04-26 DIAGNOSIS — L03116 Cellulitis of left lower limb: Secondary | ICD-10-CM | POA: Diagnosis not present

## 2015-04-29 ENCOUNTER — Telehealth: Payer: Self-pay | Admitting: Family Medicine

## 2015-04-29 DIAGNOSIS — K219 Gastro-esophageal reflux disease without esophagitis: Secondary | ICD-10-CM | POA: Diagnosis not present

## 2015-04-29 DIAGNOSIS — I4891 Unspecified atrial fibrillation: Secondary | ICD-10-CM | POA: Diagnosis not present

## 2015-04-29 DIAGNOSIS — M6281 Muscle weakness (generalized): Secondary | ICD-10-CM | POA: Diagnosis not present

## 2015-04-29 DIAGNOSIS — L03115 Cellulitis of right lower limb: Secondary | ICD-10-CM | POA: Diagnosis not present

## 2015-04-29 DIAGNOSIS — L03116 Cellulitis of left lower limb: Secondary | ICD-10-CM | POA: Diagnosis not present

## 2015-04-29 DIAGNOSIS — F1020 Alcohol dependence, uncomplicated: Secondary | ICD-10-CM | POA: Diagnosis not present

## 2015-04-29 DIAGNOSIS — K7030 Alcoholic cirrhosis of liver without ascites: Secondary | ICD-10-CM | POA: Diagnosis not present

## 2015-04-29 DIAGNOSIS — I10 Essential (primary) hypertension: Secondary | ICD-10-CM | POA: Diagnosis not present

## 2015-04-29 NOTE — Telephone Encounter (Signed)
Pamalee Leyden with [facility] called just to let PCP be aware that pt fell 04/28/15, with no injuries and minor low back pain.  Best number to call Mr. Amalia Hailey [phone]

## 2015-04-29 NOTE — Telephone Encounter (Signed)
noted 

## 2015-04-30 ENCOUNTER — Telehealth: Payer: Self-pay | Admitting: Family Medicine

## 2015-04-30 NOTE — Telephone Encounter (Signed)
Butch Penny, can you help do a verbal order for this and i can sign paper documentation when it gets here.   All noted.

## 2015-04-30 NOTE — Telephone Encounter (Signed)
[name] called to re-cert pt for another 9 weeks.  She also needs orders to pt to continue  She also had another fall last night - he was not injured.  cb number [phone]

## 2015-05-01 DIAGNOSIS — F1020 Alcohol dependence, uncomplicated: Secondary | ICD-10-CM | POA: Diagnosis not present

## 2015-05-01 DIAGNOSIS — L03115 Cellulitis of right lower limb: Secondary | ICD-10-CM | POA: Diagnosis not present

## 2015-05-01 DIAGNOSIS — K7030 Alcoholic cirrhosis of liver without ascites: Secondary | ICD-10-CM | POA: Diagnosis not present

## 2015-05-01 DIAGNOSIS — I4891 Unspecified atrial fibrillation: Secondary | ICD-10-CM | POA: Diagnosis not present

## 2015-05-01 DIAGNOSIS — L03116 Cellulitis of left lower limb: Secondary | ICD-10-CM | POA: Diagnosis not present

## 2015-05-01 DIAGNOSIS — K219 Gastro-esophageal reflux disease without esophagitis: Secondary | ICD-10-CM | POA: Diagnosis not present

## 2015-05-01 DIAGNOSIS — M6281 Muscle weakness (generalized): Secondary | ICD-10-CM | POA: Diagnosis not present

## 2015-05-01 DIAGNOSIS — I10 Essential (primary) hypertension: Secondary | ICD-10-CM | POA: Diagnosis not present

## 2015-05-01 NOTE — Telephone Encounter (Signed)
Left message for [name] with [facility] giving verbal orders to re-cert Bob Wilson for another 9 weeks per Dr. Lorelei Pont.

## 2015-05-02 DIAGNOSIS — K219 Gastro-esophageal reflux disease without esophagitis: Secondary | ICD-10-CM | POA: Diagnosis not present

## 2015-05-02 DIAGNOSIS — L03116 Cellulitis of left lower limb: Secondary | ICD-10-CM | POA: Diagnosis not present

## 2015-05-02 DIAGNOSIS — K7030 Alcoholic cirrhosis of liver without ascites: Secondary | ICD-10-CM | POA: Diagnosis not present

## 2015-05-02 DIAGNOSIS — L03115 Cellulitis of right lower limb: Secondary | ICD-10-CM | POA: Diagnosis not present

## 2015-05-02 DIAGNOSIS — M6281 Muscle weakness (generalized): Secondary | ICD-10-CM | POA: Diagnosis not present

## 2015-05-02 DIAGNOSIS — F1020 Alcohol dependence, uncomplicated: Secondary | ICD-10-CM | POA: Diagnosis not present

## 2015-05-02 DIAGNOSIS — I4891 Unspecified atrial fibrillation: Secondary | ICD-10-CM | POA: Diagnosis not present

## 2015-05-02 DIAGNOSIS — I10 Essential (primary) hypertension: Secondary | ICD-10-CM | POA: Diagnosis not present

## 2015-05-06 DIAGNOSIS — Z87440 Personal history of urinary (tract) infections: Secondary | ICD-10-CM | POA: Diagnosis not present

## 2015-05-06 DIAGNOSIS — R32 Unspecified urinary incontinence: Secondary | ICD-10-CM | POA: Diagnosis not present

## 2015-05-08 DIAGNOSIS — I4891 Unspecified atrial fibrillation: Secondary | ICD-10-CM | POA: Diagnosis not present

## 2015-05-08 DIAGNOSIS — K219 Gastro-esophageal reflux disease without esophagitis: Secondary | ICD-10-CM | POA: Diagnosis not present

## 2015-05-08 DIAGNOSIS — L03115 Cellulitis of right lower limb: Secondary | ICD-10-CM | POA: Diagnosis not present

## 2015-05-08 DIAGNOSIS — I10 Essential (primary) hypertension: Secondary | ICD-10-CM | POA: Diagnosis not present

## 2015-05-08 DIAGNOSIS — M6281 Muscle weakness (generalized): Secondary | ICD-10-CM | POA: Diagnosis not present

## 2015-05-08 DIAGNOSIS — K7030 Alcoholic cirrhosis of liver without ascites: Secondary | ICD-10-CM | POA: Diagnosis not present

## 2015-05-08 DIAGNOSIS — F1020 Alcohol dependence, uncomplicated: Secondary | ICD-10-CM | POA: Diagnosis not present

## 2015-05-08 DIAGNOSIS — L03116 Cellulitis of left lower limb: Secondary | ICD-10-CM | POA: Diagnosis not present

## 2015-05-09 DIAGNOSIS — L03115 Cellulitis of right lower limb: Secondary | ICD-10-CM | POA: Diagnosis not present

## 2015-05-09 DIAGNOSIS — I4891 Unspecified atrial fibrillation: Secondary | ICD-10-CM | POA: Diagnosis not present

## 2015-05-09 DIAGNOSIS — I10 Essential (primary) hypertension: Secondary | ICD-10-CM | POA: Diagnosis not present

## 2015-05-09 DIAGNOSIS — K219 Gastro-esophageal reflux disease without esophagitis: Secondary | ICD-10-CM | POA: Diagnosis not present

## 2015-05-09 DIAGNOSIS — K7030 Alcoholic cirrhosis of liver without ascites: Secondary | ICD-10-CM | POA: Diagnosis not present

## 2015-05-09 DIAGNOSIS — F1020 Alcohol dependence, uncomplicated: Secondary | ICD-10-CM | POA: Diagnosis not present

## 2015-05-09 DIAGNOSIS — L03116 Cellulitis of left lower limb: Secondary | ICD-10-CM | POA: Diagnosis not present

## 2015-05-09 DIAGNOSIS — R1030 Lower abdominal pain, unspecified: Secondary | ICD-10-CM | POA: Diagnosis not present

## 2015-05-09 DIAGNOSIS — M6281 Muscle weakness (generalized): Secondary | ICD-10-CM | POA: Diagnosis not present

## 2015-05-13 DIAGNOSIS — K7030 Alcoholic cirrhosis of liver without ascites: Secondary | ICD-10-CM | POA: Diagnosis not present

## 2015-05-13 DIAGNOSIS — I10 Essential (primary) hypertension: Secondary | ICD-10-CM | POA: Diagnosis not present

## 2015-05-13 DIAGNOSIS — K219 Gastro-esophageal reflux disease without esophagitis: Secondary | ICD-10-CM | POA: Diagnosis not present

## 2015-05-13 DIAGNOSIS — I4891 Unspecified atrial fibrillation: Secondary | ICD-10-CM | POA: Diagnosis not present

## 2015-05-13 DIAGNOSIS — M6281 Muscle weakness (generalized): Secondary | ICD-10-CM | POA: Diagnosis not present

## 2015-05-13 DIAGNOSIS — L03116 Cellulitis of left lower limb: Secondary | ICD-10-CM | POA: Diagnosis not present

## 2015-05-13 DIAGNOSIS — L03115 Cellulitis of right lower limb: Secondary | ICD-10-CM | POA: Diagnosis not present

## 2015-05-13 DIAGNOSIS — F1020 Alcohol dependence, uncomplicated: Secondary | ICD-10-CM | POA: Diagnosis not present

## 2015-05-16 DIAGNOSIS — M6281 Muscle weakness (generalized): Secondary | ICD-10-CM | POA: Diagnosis not present

## 2015-05-16 DIAGNOSIS — L03116 Cellulitis of left lower limb: Secondary | ICD-10-CM | POA: Diagnosis not present

## 2015-05-16 DIAGNOSIS — F1020 Alcohol dependence, uncomplicated: Secondary | ICD-10-CM | POA: Diagnosis not present

## 2015-05-16 DIAGNOSIS — L03115 Cellulitis of right lower limb: Secondary | ICD-10-CM | POA: Diagnosis not present

## 2015-05-16 DIAGNOSIS — I4891 Unspecified atrial fibrillation: Secondary | ICD-10-CM | POA: Diagnosis not present

## 2015-05-16 DIAGNOSIS — K219 Gastro-esophageal reflux disease without esophagitis: Secondary | ICD-10-CM | POA: Diagnosis not present

## 2015-05-16 DIAGNOSIS — K7030 Alcoholic cirrhosis of liver without ascites: Secondary | ICD-10-CM | POA: Diagnosis not present

## 2015-05-16 DIAGNOSIS — I10 Essential (primary) hypertension: Secondary | ICD-10-CM | POA: Diagnosis not present

## 2015-05-20 DIAGNOSIS — L03116 Cellulitis of left lower limb: Secondary | ICD-10-CM | POA: Diagnosis not present

## 2015-05-20 DIAGNOSIS — K219 Gastro-esophageal reflux disease without esophagitis: Secondary | ICD-10-CM | POA: Diagnosis not present

## 2015-05-20 DIAGNOSIS — M6281 Muscle weakness (generalized): Secondary | ICD-10-CM | POA: Diagnosis not present

## 2015-05-20 DIAGNOSIS — K7030 Alcoholic cirrhosis of liver without ascites: Secondary | ICD-10-CM | POA: Diagnosis not present

## 2015-05-20 DIAGNOSIS — L03115 Cellulitis of right lower limb: Secondary | ICD-10-CM | POA: Diagnosis not present

## 2015-05-20 DIAGNOSIS — I10 Essential (primary) hypertension: Secondary | ICD-10-CM | POA: Diagnosis not present

## 2015-05-20 DIAGNOSIS — I4891 Unspecified atrial fibrillation: Secondary | ICD-10-CM | POA: Diagnosis not present

## 2015-05-20 DIAGNOSIS — F1020 Alcohol dependence, uncomplicated: Secondary | ICD-10-CM | POA: Diagnosis not present

## 2015-05-22 DIAGNOSIS — M6281 Muscle weakness (generalized): Secondary | ICD-10-CM | POA: Diagnosis not present

## 2015-05-22 DIAGNOSIS — L03116 Cellulitis of left lower limb: Secondary | ICD-10-CM | POA: Diagnosis not present

## 2015-05-22 DIAGNOSIS — F1020 Alcohol dependence, uncomplicated: Secondary | ICD-10-CM | POA: Diagnosis not present

## 2015-05-22 DIAGNOSIS — L03115 Cellulitis of right lower limb: Secondary | ICD-10-CM | POA: Diagnosis not present

## 2015-05-22 DIAGNOSIS — K7030 Alcoholic cirrhosis of liver without ascites: Secondary | ICD-10-CM | POA: Diagnosis not present

## 2015-05-22 DIAGNOSIS — I4891 Unspecified atrial fibrillation: Secondary | ICD-10-CM | POA: Diagnosis not present

## 2015-05-22 DIAGNOSIS — I10 Essential (primary) hypertension: Secondary | ICD-10-CM | POA: Diagnosis not present

## 2015-05-22 DIAGNOSIS — K219 Gastro-esophageal reflux disease without esophagitis: Secondary | ICD-10-CM | POA: Diagnosis not present

## 2015-05-24 DIAGNOSIS — I10 Essential (primary) hypertension: Secondary | ICD-10-CM | POA: Diagnosis not present

## 2015-05-24 DIAGNOSIS — L03115 Cellulitis of right lower limb: Secondary | ICD-10-CM | POA: Diagnosis not present

## 2015-05-24 DIAGNOSIS — F1020 Alcohol dependence, uncomplicated: Secondary | ICD-10-CM | POA: Diagnosis not present

## 2015-05-24 DIAGNOSIS — K7030 Alcoholic cirrhosis of liver without ascites: Secondary | ICD-10-CM | POA: Diagnosis not present

## 2015-05-24 DIAGNOSIS — M6281 Muscle weakness (generalized): Secondary | ICD-10-CM | POA: Diagnosis not present

## 2015-05-24 DIAGNOSIS — K219 Gastro-esophageal reflux disease without esophagitis: Secondary | ICD-10-CM | POA: Diagnosis not present

## 2015-05-24 DIAGNOSIS — I4891 Unspecified atrial fibrillation: Secondary | ICD-10-CM | POA: Diagnosis not present

## 2015-05-24 DIAGNOSIS — L03116 Cellulitis of left lower limb: Secondary | ICD-10-CM | POA: Diagnosis not present

## 2015-05-28 DIAGNOSIS — K7030 Alcoholic cirrhosis of liver without ascites: Secondary | ICD-10-CM | POA: Diagnosis not present

## 2015-05-28 DIAGNOSIS — M6281 Muscle weakness (generalized): Secondary | ICD-10-CM | POA: Diagnosis not present

## 2015-05-28 DIAGNOSIS — F1020 Alcohol dependence, uncomplicated: Secondary | ICD-10-CM | POA: Diagnosis not present

## 2015-05-28 DIAGNOSIS — I10 Essential (primary) hypertension: Secondary | ICD-10-CM | POA: Diagnosis not present

## 2015-05-28 DIAGNOSIS — L03116 Cellulitis of left lower limb: Secondary | ICD-10-CM | POA: Diagnosis not present

## 2015-05-28 DIAGNOSIS — L03115 Cellulitis of right lower limb: Secondary | ICD-10-CM | POA: Diagnosis not present

## 2015-05-28 DIAGNOSIS — K219 Gastro-esophageal reflux disease without esophagitis: Secondary | ICD-10-CM | POA: Diagnosis not present

## 2015-05-28 DIAGNOSIS — I4891 Unspecified atrial fibrillation: Secondary | ICD-10-CM | POA: Diagnosis not present

## 2015-05-29 DIAGNOSIS — L03115 Cellulitis of right lower limb: Secondary | ICD-10-CM | POA: Diagnosis not present

## 2015-05-29 DIAGNOSIS — M6281 Muscle weakness (generalized): Secondary | ICD-10-CM | POA: Diagnosis not present

## 2015-05-29 DIAGNOSIS — L03116 Cellulitis of left lower limb: Secondary | ICD-10-CM | POA: Diagnosis not present

## 2015-05-29 DIAGNOSIS — K219 Gastro-esophageal reflux disease without esophagitis: Secondary | ICD-10-CM | POA: Diagnosis not present

## 2015-05-29 DIAGNOSIS — K7030 Alcoholic cirrhosis of liver without ascites: Secondary | ICD-10-CM | POA: Diagnosis not present

## 2015-05-29 DIAGNOSIS — F1020 Alcohol dependence, uncomplicated: Secondary | ICD-10-CM | POA: Diagnosis not present

## 2015-05-29 DIAGNOSIS — I10 Essential (primary) hypertension: Secondary | ICD-10-CM | POA: Diagnosis not present

## 2015-05-29 DIAGNOSIS — I4891 Unspecified atrial fibrillation: Secondary | ICD-10-CM | POA: Diagnosis not present

## 2015-06-01 ENCOUNTER — Encounter (HOSPITAL_COMMUNITY): Payer: Self-pay | Admitting: Anesthesiology

## 2015-06-01 ENCOUNTER — Other Ambulatory Visit: Payer: Self-pay

## 2015-06-01 ENCOUNTER — Emergency Department (HOSPITAL_COMMUNITY): Payer: Medicare Other

## 2015-06-01 ENCOUNTER — Encounter (HOSPITAL_COMMUNITY): Payer: Self-pay | Admitting: Emergency Medicine

## 2015-06-01 ENCOUNTER — Encounter (HOSPITAL_COMMUNITY)
Admission: EM | Disposition: A | Discharge status: Skilled Nursing Facility | Payer: Self-pay | Source: Home / Self Care | Attending: Internal Medicine

## 2015-06-01 ENCOUNTER — Inpatient Hospital Stay (HOSPITAL_COMMUNITY)
Admission: EM | Admit: 2015-06-01 | Discharge: 2015-06-05 | DRG: 536 | Disposition: A | Discharge status: Skilled Nursing Facility | Payer: Medicare Other | Attending: Internal Medicine | Admitting: Internal Medicine

## 2015-06-01 DIAGNOSIS — E876 Hypokalemia: Secondary | ICD-10-CM | POA: Diagnosis not present

## 2015-06-01 DIAGNOSIS — K7030 Alcoholic cirrhosis of liver without ascites: Secondary | ICD-10-CM | POA: Diagnosis not present

## 2015-06-01 DIAGNOSIS — S72001D Fracture of unspecified part of neck of right femur, subsequent encounter for closed fracture with routine healing: Secondary | ICD-10-CM | POA: Diagnosis not present

## 2015-06-01 DIAGNOSIS — R296 Repeated falls: Secondary | ICD-10-CM | POA: Diagnosis not present

## 2015-06-01 DIAGNOSIS — N183 Chronic kidney disease, stage 3 (moderate): Secondary | ICD-10-CM | POA: Diagnosis not present

## 2015-06-01 DIAGNOSIS — I10 Essential (primary) hypertension: Secondary | ICD-10-CM | POA: Diagnosis not present

## 2015-06-01 DIAGNOSIS — Z91199 Patient's noncompliance with other medical treatment and regimen due to unspecified reason: Secondary | ICD-10-CM

## 2015-06-01 DIAGNOSIS — S72031A Displaced midcervical fracture of right femur, initial encounter for closed fracture: Principal | ICD-10-CM | POA: Diagnosis present

## 2015-06-01 DIAGNOSIS — D638 Anemia in other chronic diseases classified elsewhere: Secondary | ICD-10-CM | POA: Diagnosis not present

## 2015-06-01 DIAGNOSIS — M109 Gout, unspecified: Secondary | ICD-10-CM | POA: Diagnosis present

## 2015-06-01 DIAGNOSIS — S72001A Fracture of unspecified part of neck of right femur, initial encounter for closed fracture: Secondary | ICD-10-CM | POA: Diagnosis present

## 2015-06-01 DIAGNOSIS — N312 Flaccid neuropathic bladder, not elsewhere classified: Secondary | ICD-10-CM | POA: Diagnosis present

## 2015-06-01 DIAGNOSIS — S72009A Fracture of unspecified part of neck of unspecified femur, initial encounter for closed fracture: Secondary | ICD-10-CM | POA: Diagnosis present

## 2015-06-01 DIAGNOSIS — Z993 Dependence on wheelchair: Secondary | ICD-10-CM | POA: Diagnosis not present

## 2015-06-01 DIAGNOSIS — Z5309 Procedure and treatment not carried out because of other contraindication: Secondary | ICD-10-CM | POA: Diagnosis not present

## 2015-06-01 DIAGNOSIS — F1020 Alcohol dependence, uncomplicated: Secondary | ICD-10-CM | POA: Diagnosis present

## 2015-06-01 DIAGNOSIS — W010XXA Fall on same level from slipping, tripping and stumbling without subsequent striking against object, initial encounter: Secondary | ICD-10-CM | POA: Diagnosis not present

## 2015-06-01 DIAGNOSIS — Z886 Allergy status to analgesic agent status: Secondary | ICD-10-CM | POA: Diagnosis not present

## 2015-06-01 DIAGNOSIS — Z01810 Encounter for preprocedural cardiovascular examination: Secondary | ICD-10-CM | POA: Diagnosis not present

## 2015-06-01 DIAGNOSIS — Z87891 Personal history of nicotine dependence: Secondary | ICD-10-CM

## 2015-06-01 DIAGNOSIS — Z531 Procedure and treatment not carried out because of patient's decision for reasons of belief and group pressure: Secondary | ICD-10-CM | POA: Diagnosis present

## 2015-06-01 DIAGNOSIS — E785 Hyperlipidemia, unspecified: Secondary | ICD-10-CM | POA: Diagnosis present

## 2015-06-01 DIAGNOSIS — Z881 Allergy status to other antibiotic agents status: Secondary | ICD-10-CM

## 2015-06-01 DIAGNOSIS — W1830XA Fall on same level, unspecified, initial encounter: Secondary | ICD-10-CM | POA: Diagnosis present

## 2015-06-01 DIAGNOSIS — I4891 Unspecified atrial fibrillation: Secondary | ICD-10-CM | POA: Diagnosis not present

## 2015-06-01 DIAGNOSIS — Z9181 History of falling: Secondary | ICD-10-CM | POA: Diagnosis not present

## 2015-06-01 DIAGNOSIS — R911 Solitary pulmonary nodule: Secondary | ICD-10-CM | POA: Diagnosis present

## 2015-06-01 DIAGNOSIS — I480 Paroxysmal atrial fibrillation: Secondary | ICD-10-CM | POA: Diagnosis present

## 2015-06-01 DIAGNOSIS — E871 Hypo-osmolality and hyponatremia: Secondary | ICD-10-CM | POA: Diagnosis not present

## 2015-06-01 DIAGNOSIS — D649 Anemia, unspecified: Secondary | ICD-10-CM | POA: Diagnosis not present

## 2015-06-01 DIAGNOSIS — I129 Hypertensive chronic kidney disease with stage 1 through stage 4 chronic kidney disease, or unspecified chronic kidney disease: Secondary | ICD-10-CM | POA: Diagnosis not present

## 2015-06-01 DIAGNOSIS — I481 Persistent atrial fibrillation: Secondary | ICD-10-CM | POA: Diagnosis not present

## 2015-06-01 DIAGNOSIS — Z9119 Patient's noncompliance with other medical treatment and regimen: Secondary | ICD-10-CM | POA: Diagnosis not present

## 2015-06-01 DIAGNOSIS — I4819 Other persistent atrial fibrillation: Secondary | ICD-10-CM | POA: Diagnosis present

## 2015-06-01 DIAGNOSIS — S72091A Other fracture of head and neck of right femur, initial encounter for closed fracture: Secondary | ICD-10-CM | POA: Diagnosis not present

## 2015-06-01 DIAGNOSIS — R972 Elevated prostate specific antigen [PSA]: Secondary | ICD-10-CM | POA: Diagnosis not present

## 2015-06-01 DIAGNOSIS — R918 Other nonspecific abnormal finding of lung field: Secondary | ICD-10-CM | POA: Diagnosis not present

## 2015-06-01 DIAGNOSIS — M25551 Pain in right hip: Secondary | ICD-10-CM | POA: Diagnosis not present

## 2015-06-01 LAB — BASIC METABOLIC PANEL
ANION GAP: 12 (ref 5–15)
ANION GAP: 8 (ref 5–15)
BUN: 12 mg/dL (ref 6–20)
BUN: 10 mg/dL (ref 6–20)
CALCIUM: 7.7 mg/dL — AB (ref 8.9–10.3)
CO2: 25 mmol/L (ref 22–32)
CO2: 29 mmol/L (ref 22–32)
CREATININE: 1.4 mg/dL — AB (ref 0.61–1.24)
Calcium: 7.9 mg/dL — ABNORMAL LOW (ref 8.9–10.3)
Chloride: 89 mmol/L — ABNORMAL LOW (ref 101–111)
Chloride: 92 mmol/L — ABNORMAL LOW (ref 101–111)
Creatinine, Ser: 1.49 mg/dL — ABNORMAL HIGH (ref 0.61–1.24)
GFR calc Af Amer: 49 mL/min — ABNORMAL LOW (ref >60)
GFR calc non Af Amer: 43 mL/min — ABNORMAL LOW (ref >60)
GFR, EST AFRICAN AMERICAN: 53 mL/min — AB (ref >60)
GFR, EST NON AFRICAN AMERICAN: 46 mL/min — AB (ref >60)
Glucose, Bld: 78 mg/dL (ref 65–99)
Glucose, Bld: 95 mg/dL (ref 65–99)
POTASSIUM: 2.9 mmol/L — AB (ref 3.5–5.1)
Potassium: 2.9 mmol/L — ABNORMAL LOW (ref 3.5–5.1)
SODIUM: 129 mmol/L — AB (ref 135–145)
Sodium: 126 mmol/L — ABNORMAL LOW (ref 135–145)

## 2015-06-01 LAB — PROTIME-INR
INR: 1.01 (ref 0.00–1.49)
Prothrombin Time: 13.5 seconds (ref 11.6–15.2)

## 2015-06-01 LAB — IRON AND TIBC
Iron: 164 ug/dL (ref 45–182)
SATURATION RATIOS: 86 % — AB (ref 17.9–39.5)
TIBC: 190 ug/dL — ABNORMAL LOW (ref 250–450)
UIBC: 26 ug/dL

## 2015-06-01 LAB — SODIUM, URINE, RANDOM: Sodium, Ur: <10 mmol/L

## 2015-06-01 LAB — OSMOLALITY, URINE: Osmolality, Ur: 207 mOsm/kg — ABNORMAL LOW (ref 390–1090)

## 2015-06-01 LAB — CBC WITH DIFFERENTIAL/PLATELET
Basophils Absolute: 0 10*3/uL (ref 0.0–0.1)
Basophils Relative: 0 %
Eosinophils Absolute: 0.1 10*3/uL (ref 0.0–0.7)
Eosinophils Relative: 1 %
HEMATOCRIT: 24.5 % — AB (ref 39.0–52.0)
Hemoglobin: 8.6 g/dL — ABNORMAL LOW (ref 13.0–17.0)
Lymphocytes Relative: 15 %
Lymphs Abs: 1.3 10*3/uL (ref 0.7–4.0)
MCH: 28 pg (ref 26.0–34.0)
MCHC: 35.1 g/dL (ref 30.0–36.0)
MCV: 79.8 fL (ref 78.0–100.0)
MONOS PCT: 16 %
Monocytes Absolute: 1.4 10*3/uL — ABNORMAL HIGH (ref 0.1–1.0)
NEUTROS ABS: 6.1 10*3/uL (ref 1.7–7.7)
NEUTROS PCT: 68 %
Platelets: 234 10*3/uL (ref 150–400)
RBC: 3.07 MIL/uL — AB (ref 4.22–5.81)
RDW: 18 % — ABNORMAL HIGH (ref 11.5–15.5)
WBC: 8.9 10*3/uL (ref 4.0–10.5)

## 2015-06-01 LAB — FERRITIN: Ferritin: 373 ng/mL — ABNORMAL HIGH (ref 24–336)

## 2015-06-01 LAB — VITAMIN B12: VITAMIN B 12: 218 pg/mL (ref 180–914)

## 2015-06-01 LAB — RETICULOCYTES
RBC.: 3.2 MIL/uL — AB (ref 4.22–5.81)
RETIC COUNT ABSOLUTE: 41.6 10*3/uL (ref 19.0–186.0)
Retic Ct Pct: 1.3 % (ref 0.4–3.1)

## 2015-06-01 LAB — FOLATE: FOLATE: 4.8 ng/mL — AB (ref >5.9)

## 2015-06-01 LAB — MRSA PCR SCREENING: MRSA BY PCR: NEGATIVE

## 2015-06-01 SURGERY — HEMIARTHROPLASTY, HIP, DIRECT ANTERIOR APPROACH, FOR FRACTURE
Anesthesia: Choice | Laterality: Right

## 2015-06-01 MED ORDER — WHITE PETROLATUM GEL
Status: DC | PRN
  Administered 2015-06-01: 0.2 via TOPICAL
  Filled 2015-06-01: qty 1
  Filled 2015-06-01: qty 5
  Filled 2015-06-01: qty 28.35

## 2015-06-01 MED ORDER — HYDROCODONE-ACETAMINOPHEN 7.5-325 MG PO TABS
1.0000 | ORAL_TABLET | Freq: Once | ORAL | Status: AC
  Administered 2015-06-01: 1 via ORAL
  Filled 2015-06-01: qty 1

## 2015-06-01 MED ORDER — SODIUM CHLORIDE 0.9 % IV SOLN
Freq: Once | INTRAVENOUS | Status: AC
  Administered 2015-06-01: 11:00:00 via INTRAVENOUS

## 2015-06-01 MED ORDER — HYDROXYZINE HCL 25 MG PO TABS
25.0000 mg | ORAL_TABLET | Freq: Four times a day (QID) | ORAL | Status: AC | PRN
  Filled 2015-06-01: qty 1

## 2015-06-01 MED ORDER — THIAMINE HCL 100 MG/ML IJ SOLN
100.0000 mg | Freq: Once | INTRAMUSCULAR | Status: DC
  Filled 2015-06-01: qty 1

## 2015-06-01 MED ORDER — SODIUM CHLORIDE 0.9 % IV SOLN
INTRAVENOUS | Status: DC
  Administered 2015-06-01: 17:00:00 via INTRAVENOUS

## 2015-06-01 MED ORDER — ONDANSETRON 4 MG PO TBDP
4.0000 mg | ORAL_TABLET | Freq: Four times a day (QID) | ORAL | Status: AC | PRN
  Filled 2015-06-01: qty 1

## 2015-06-01 MED ORDER — VITAMIN B-1 100 MG PO TABS
100.0000 mg | ORAL_TABLET | Freq: Every day | ORAL | Status: DC
Start: 2015-06-02 — End: 2015-06-02

## 2015-06-01 MED ORDER — DILTIAZEM HCL ER COATED BEADS 240 MG PO CP24
240.0000 mg | ORAL_CAPSULE | Freq: Every day | ORAL | Status: DC
  Administered 2015-06-03 – 2015-06-05 (×3): 240 mg via ORAL
  Filled 2015-06-01 (×5): qty 1

## 2015-06-01 MED ORDER — MORPHINE SULFATE (PF) 4 MG/ML IV SOLN
4.0000 mg | INTRAVENOUS | Status: DC | PRN
  Administered 2015-06-01 – 2015-06-05 (×8): 4 mg via INTRAVENOUS
  Filled 2015-06-01 (×9): qty 1

## 2015-06-01 MED ORDER — MIRTAZAPINE 15 MG PO TABS
15.0000 mg | ORAL_TABLET | Freq: Every day | ORAL | Status: DC
  Administered 2015-06-01 – 2015-06-04 (×4): 15 mg via ORAL
  Filled 2015-06-01 (×5): qty 1

## 2015-06-01 MED ORDER — LORAZEPAM 1 MG PO TABS: 1.0000 mg | ORAL_TABLET | Freq: Four times a day (QID) | ORAL | Status: DC | PRN

## 2015-06-01 MED ORDER — LOPERAMIDE HCL 2 MG PO CAPS
2.0000 mg | ORAL_CAPSULE | ORAL | Status: AC | PRN
  Filled 2015-06-01: qty 2

## 2015-06-01 MED ORDER — COLCHICINE 0.6 MG PO TABS
0.6000 mg | ORAL_TABLET | Freq: Every day | ORAL | Status: DC
  Filled 2015-06-01: qty 1

## 2015-06-01 MED ORDER — POTASSIUM CHLORIDE CRYS ER 20 MEQ PO TBCR
40.0000 meq | EXTENDED_RELEASE_TABLET | Freq: Once | ORAL | Status: AC
  Administered 2015-06-01: 40 meq via ORAL
  Filled 2015-06-01: qty 2

## 2015-06-01 MED ORDER — HYDROCODONE-ACETAMINOPHEN 5-325 MG PO TABS
1.0000 | ORAL_TABLET | Freq: Four times a day (QID) | ORAL | Status: DC | PRN
  Administered 2015-06-03 – 2015-06-05 (×5): 2 via ORAL
  Filled 2015-06-01 (×5): qty 2

## 2015-06-01 MED ORDER — ENOXAPARIN SODIUM 40 MG/0.4ML ~~LOC~~ SOLN
40.0000 mg | SUBCUTANEOUS | Status: DC
  Administered 2015-06-02: 40 mg via SUBCUTANEOUS
  Filled 2015-06-01 (×2): qty 0.4

## 2015-06-01 MED ORDER — BISACODYL 5 MG PO TBEC: 5.0000 mg | DELAYED_RELEASE_TABLET | Freq: Every day | ORAL | Status: DC | PRN

## 2015-06-01 MED ORDER — ADULT MULTIVITAMIN W/MINERALS CH
1.0000 | ORAL_TABLET | Freq: Every day | ORAL | Status: DC
  Administered 2015-06-02 – 2015-06-05 (×4): 1 via ORAL
  Filled 2015-06-01 (×5): qty 1

## 2015-06-01 MED ORDER — MORPHINE SULFATE (PF) 2 MG/ML IV SOLN: 0.5000 mg | INTRAVENOUS | Status: DC | PRN

## 2015-06-01 NOTE — Consult Note (Addendum)
ORTHOPAEDIC CONSULTATION  REQUESTING PHYSICIAN: Harvel Quale, MD  PCP:  Owens Loffler, MD  Chief Complaint: RIGHT hip pain  HPI: Bob Wilson is a 81 y.o. male who complains of  RIGHT hip pain. He was helping his wife with laundry early this morning when he lost his balance and fell onto his right side. Had immediate right hip pain and inability to weight bear. Is a household ambulator with a walker. Receives HHPT. Last PO intake yesterday.  Past Medical History  Diagnosis Date  . Anemia     NOS  . Arthritis   . Gout   . Hyperlipidemia   . Hypertension   . GERD (gastroesophageal reflux disease)   . Elevated PSA     multiple times, refused urology eval [facility] notes)  . ETOH abuse   . Medically noncompliant     noncompliant with follow ups, labs.  . Alcoholism ([place]) 06/25/2011  . Cirrhosis, alcoholic ([place]) 2/95/6213  . 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 Paroxysmal atrial fibrillation (HCC)   . Bilateral lower extremity edema     Chronic.  Marland Kitchen History of empyema of pleura 12/2011    s/p VATS decortication  . Dysrhythmia     hx of PAF  . Foley catheter in place   . Falls frequently   . 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 Chronic indwelling Foley catheter 04/12/2015   Past Surgical History  Procedure Laterality Date  . Lipoma excision    . Transthoracic echocardiogram  09/03/2014    Normal overall LV size. EF 55-60% with no regional W. MA. Trivial AI. Mild-moderate MR. Mild R. and LA dilation. Mild to moderate TR. PA pressures ~47 mmHg  . Video assisted thoracoscopy (vats)/empyema  12/2011  . Nm myoview ltd  10/12/14    LOW RISK - NO ISCHEMIA, DIAPHRAGMATIC ATTENUATION, NON-GATED  . Green light laser turp (transurethral resection of prostate N/A 12/21/2014    Procedure: GREEN LIGHT LASER TURP (TRANSURETHRAL RESECTION OF PROSTATE;  Surgeon: Ardis Hughs, MD;  Location: [facility];  Service: Urology;  Laterality: N/A;   Social History   Social History  . Marital  Status: Married    Spouse Name: N/A  . Number of Children: 2  . Years of Education: N/A   Occupational History  . former Engineer, building services     retired   Social History Main Topics  . Smoking status: Former Smoker -- 0.25 packs/day for 29 years    Types: Cigarettes    Quit date: 07/20/1974  . Smokeless tobacco: Never Used     Comment: Remote- quit 45 years ago.  . Alcohol Use: 0.0 oz/week    0 Standard drinks or equivalent per week     Comment: states that he drinks "couple pints of gin" every day.  nnow has only had maybe one shot of June in the last few weeks.  . Drug Use: No  . Sexual Activity: Not Currently   Other Topics Concern  . None   Social History Narrative   Chronic Alcoholic, severe   Jehovah's Witness      He does not exercise much because of unsteady gait. He does have a walk and use range of motion at least 2 days a week with physical therapy.   History reviewed. No pertinent family history. Allergies  Allergen Reactions  . Other     NO BLOOD PRODUCTS(see blood/blood product refusal consent) pt in agreement with albumin   . Ace Inhibitors Other (See Comments)  REACTION: Angioedema  . Aspirin Other (See Comments)    Nose bleeds   . Lisinopril Swelling and Other (See Comments)    REACTION: Facial Swelling  . Rocephin [Ceftriaxone Sodium In Dextrose] Hives    Previously tolerated PCN and Ceph 2/14 Hives with ceftriaxone dose 2/15 No reaction with benadryl/ceftriaxone 2/16 Tolerating Zosyn w/o reaction   Prior to Admission medications   Medication Sig Start Date End Date Taking? Authorizing Provider  acetaminophen-codeine (TYLENOL #3) 300-30 MG per tablet Take 1 tablet by mouth every 6 (six) hours as needed for moderate pain. 04/10/15   Owens Loffler, MD  colchicine 0.6 MG tablet take 1 tablet by mouth twice a day if needed 12/05/14   Owens Loffler, MD  diltiazem (CARDIZEM CD) 240 MG 24 hr capsule Take 1 capsule (240 mg total) by mouth daily. 09/26/14    Leonie Man, MD  furosemide (LASIX) 40 MG tablet Take 1 tablet (40 mg total) by mouth daily. 09/26/14   Leonie Man, MD  mirtazapine (REMERON) 15 MG tablet Take 1 tablet (15 mg total) by mouth at bedtime. 04/10/15   Owens Loffler, MD   Dg Hip Unilat With Pelvis 2-3 Views Right  06/01/2015  CLINICAL DATA:  Pt c/o severe hip pain x 3hrs today s/p "sat down hard" when walking with his roller-walker this morning and slipped. EXAM: DG HIP (WITH OR WITHOUT PELVIS) 2-3V RIGHT COMPARISON:  04/10/2015 FINDINGS: There is a fracture of the right femoral neck, mid cervical, mildly comminuted and displaced, with the distal fracture component displacing superiorly by approximately 1 cm. There is no significant varus or valgus angulation. There is mild apex anterior angulation. Bones are diffusely demineralized. No other fractures. Hip joints are normally aligned. IMPRESSION: Mildly displaced fracture of the right femoral neck. Electronically Signed   By: Lajean Manes M.D.   On: 06/01/2015 08:36   Dg Femur, Min 2 Views Right  06/01/2015  CLINICAL DATA:  One hip pain for 3 hours today after sitting. EXAM: RIGHT FEMUR 2 VIEWS COMPARISON:  None. FINDINGS: The proximal right femur is captured on today' s hip radiographs, and appears to show a femoral neck fracture. No femoral shaft or distal femoral fracture is identified. The distal margin of the right lateral femoral condyle is not entirely included on the frontal view and both views are somewhat oblique. Bony demineralization is present. I cannot exclude a small knee effusion. Degenerative spurring in the knee. IMPRESSION: 1. Osteoarthritis in the knee along with bony demineralization. 2. The proximal femur is shown on the hip radiographs and demonstrates a femoral neck fracture. Electronically Signed   By: Van Clines M.D.   On: 06/01/2015 08:38    Positive ROS: All other systems have been reviewed and were otherwise negative with the exception of those  mentioned in the HPI and as above.  Physical Exam: General: Alert, no acute distress Cardiovascular: No pedal edema Respiratory: No cyanosis, no use of accessory musculature GI: No organomegaly, abdomen is soft and non-tender Skin: No lesions in the area of chief complaint Neurologic: Sensation intact distally Psychiatric: Patient is competent for consent with normal mood and affect Lymphatic: No axillary or cervical lymphadenopathy  MUSCULOSKELETAL:  BUE/LLE: No TTP, crepitation, deformity. Painless ROM. RLE shortened and externally rotated. No hip lesions. Chronic edema to foot and lower leg with venous skin stasis changes. Foot perfused. + TA/GS/EHL. Ankle is stiff.  Assessment: Displaced right femoral neck fracture Deconditioning Multiple medical problems  Plan: I discussed the diagnosis with  the patient. We discussed the risks, benefits, and alternatives to right hip hemiarthroplasty. Surgery is risky, however with nonoperative treatment, I would expect a poor outcome. I would recommend hospitalist consult for perioperative risk stratification and medical optimization. NPO for now. Hold anitcoagulation. I have him tentatively scheduled for 1300. Patient requests that I speak to his wife before surgery, and she is in transit. Hgb 8.6 - Jehovah's witness; will have to discuss with his wife.     Mckensie Scotti, Horald Pollen, MD Cell [phone]    06/01/2015 9:52 AM    ADDENDUM: Had lengthy discussion with patient and wife. Very difficult situation. Current hgb 8.6. We discussed nonoperative vs operative treatment. Nonoperative treatment will likely lead to death. Operative treatment very risky. Ideally, I would transfuse PRBCs preop to hgb 10, and then proceed with hemiarthroplasty. Patient and his wife refuse blood products.  They indicate they would like to proceed with surgery even though they refuse blood products. They are considering Cell Saver.

## 2015-06-01 NOTE — ED Notes (Signed)
Patient here with complaints of fall this am. Right hip pain 10/10.

## 2015-06-01 NOTE — ED Notes (Signed)
Bed: KS13 Expected date: 06/01/15 Expected time: 7:35 AM Means of arrival: Ambulance Comments: Fall

## 2015-06-01 NOTE — Progress Notes (Signed)
Pt K level is 2.9. Hospitalist notified and ordered one time dose of K dur 40 mEq P.O. Order carried out. Nursing will continue to monitor.

## 2015-06-01 NOTE — H&P (Signed)
[facility] History and Physical  HARSHAAN Wilson KKX:[id number] DOB: 01-24-1933 DOA: 06/01/2015  Referring physician: Alfonse Spruce, ED PCP: Owens Loffler, MD  Specialists: Ortho swinteck  Chief Complaint: fall, hip #  HPI :81 y/o ? baseline wheelchair bound-has had multiple falls in the past resulting in Facial trauma back 01/2014 Bladder Outlet Obs s/p laser ablation 12/21/14-With chr Indwelling foley cath followed by Dr. [name] Prior Chr ETOH with continued daily use ~ 1 pint per day with cirrhosis Prior Empyema 01/2012 Prior smoker Afib CHad2Vasc2 score~3 not on [facility] Nuclear stress testing 10/11/14 low risk Gout Spiculated lung lesion Prior admission complicated Pyelo 1/69/67 CHr LE cellulitis  Sleeps in a recliner and has done so for a long time. This morning was trying to remove the sheets off of the recliner and fell backward accidentally and hit his right side. He has history of multiple falls last fall being last month Came to the emergency room as he could not move and was found to have right femoral neck fracture.  He is not on anticoagulation although he does have A. fib and this is because he takes alcohol chronically and has chronic falls.  Emergency room workup revealed Sodium 126, potassium 2.9, BUN/creatinine 12/1.4, hemoglobin 8.6, platelet count 234, white count 8.9, INR 1.01 Chest x-ray portable = stable lung disease with no apparent findings suggest pneumonia EKG showed sinus rhythm with PVCs/PACs no ST-T wave changes suggestive of injury.  He has been feeling well otherwise has not had any constitutional symptoms such as Fever, chills, rash, blurred vision, double vision, dark stool, tarry stool   Past Medical History  Diagnosis Date  . Anemia     NOS  . Arthritis   . Gout   . Hyperlipidemia   . Hypertension   . GERD (gastroesophageal reflux disease)   . Elevated PSA     multiple times, refused urology eval [facility] notes)  . ETOH abuse   . Medically  noncompliant     noncompliant with follow ups, labs.  . Alcoholism ([place]) 06/25/2011  . Cirrhosis, alcoholic ([place]) 8/93/8101  . 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 Paroxysmal atrial fibrillation (HCC)   . Bilateral lower extremity edema     Chronic.  Marland Kitchen History of empyema of pleura 12/2011    s/p VATS decortication  . Dysrhythmia     hx of PAF  . Foley catheter in place   . Falls frequently   . 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 Chronic indwelling Foley catheter 04/12/2015   Past Surgical History  Procedure Laterality Date  . Lipoma excision    . Transthoracic echocardiogram  09/03/2014    Normal overall LV size. EF 55-60% with no regional W. MA. Trivial AI. Mild-moderate MR. Mild R. and LA dilation. Mild to moderate TR. PA pressures ~47 mmHg  . Video assisted thoracoscopy (vats)/empyema  12/2011  . Nm myoview ltd  10/12/14    LOW RISK - NO ISCHEMIA, DIAPHRAGMATIC ATTENUATION, NON-GATED  . Green light laser turp (transurethral resection of prostate N/A 12/21/2014    Procedure: GREEN LIGHT LASER TURP (TRANSURETHRAL RESECTION OF PROSTATE;  Surgeon: Ardis Hughs, MD;  Location: [facility];  Service: Urology;  Laterality: N/A;   Social History:  Social History   Social History Narrative   Chronic Alcoholic, severe   Jehovah's Witness         He does not exercise much because of unsteady gait.    He does have a walker and use range of motion at  least 2 days a week with physical therapy.      Now usually using a motorized WC      Used to be a Dealer    Allergies  Allergen Reactions  . Other     NO BLOOD PRODUCTS(see blood/blood product refusal consent) pt in agreement with albumin   . Ace Inhibitors Other (See Comments)    REACTION: Angioedema  . Aspirin Other (See Comments)    Nose bleeds   . Lisinopril Swelling and Other (See Comments)    REACTION: Facial Swelling  . Rocephin [Ceftriaxone Sodium In Dextrose] Hives    Previously tolerated PCN and Ceph 2/14 Hives with  ceftriaxone dose 2/15 No reaction with benadryl/ceftriaxone 2/16 Tolerating Zosyn w/o reaction    Family History  Problem Relation Age of Onset  . Diabetes Mellitus II Father   . Stroke Mother      Prior to Admission medications   Medication Sig Start Date End Date Taking? Authorizing Provider  acetaminophen-codeine (TYLENOL #3) 300-30 MG per tablet Take 1 tablet by mouth every 6 (six) hours as needed for moderate pain. 04/10/15  Yes Owens Loffler, MD  colchicine 0.6 MG tablet take 1 tablet by mouth twice a day if needed Patient taking differently: take 1 tablet by mouth twice a day if needed for gout. 12/05/14  Yes Spencer Copland, MD  diltiazem (CARDIZEM CD) 240 MG 24 hr capsule Take 1 capsule (240 mg total) by mouth daily. 09/26/14  Yes Leonie Man, MD  furosemide (LASIX) 40 MG tablet Take 1 tablet (40 mg total) by mouth daily. 09/26/14  Yes Leonie Man, MD  mirtazapine (REMERON) 15 MG tablet Take 1 tablet (15 mg total) by mouth at bedtime. 04/10/15  Yes Owens Loffler, MD   Physical Exam: Filed Vitals:   06/01/15 0740  BP: 146/77  Pulse: 89  Temp: 97.9 F (36.6 C)  Resp: 20  SpO2: 100%    On exam EOMI NCAT very poor dentition No JVD no bruit S1-S2 no murmur rub or gallop PVCs noted seems regular otherwise Chest clinically clear although exam limited as patient finding it painful to sit up Abdomen soft nontender nondistended no rebound Right foot externally rotated, edematous to some extent Overall has 5/5 power  Labs on Admission:  Basic Metabolic Panel:  Recent Labs Lab 06/01/15 0858  NA 126*  K 2.9*  CL 89*  CO2 25  GLUCOSE 78  BUN 12  CREATININE 1.49*  CALCIUM 7.9*   Liver Function Tests: No results for input(s): AST, ALT, ALKPHOS, BILITOT, PROT, ALBUMIN in the last 168 hours. No results for input(s): LIPASE, AMYLASE in the last 168 hours. No results for input(s): AMMONIA in the last 168 hours. CBC:  Recent Labs Lab 06/01/15 0858  WBC 8.9   NEUTROABS 6.1  HGB 8.6*  HCT 24.5*  MCV 79.8  PLT 234   Cardiac Enzymes: No results for input(s): CKTOTAL, CKMB, CKMBINDEX, TROPONINI in the last 168 hours.  BNP (last 3 results)  Recent Labs  09/02/14 1526  BNP 431.0*    ProBNP (last 3 results) No results for input(s): PROBNP in the last 8760 hours.  CBG: No results for input(s): GLUCAP in the last 168 hours.  Radiological Exams on Admission: Dg Chest Portable 1 View  06/01/2015  CLINICAL DATA:  Fall when right hip fracture. Preoperative respiratory exam. EXAM: PORTABLE CHEST 1 VIEW COMPARISON:  12/18/2014 FINDINGS: Stable chronic lung disease and elevation of the right hemidiaphragm. There is no evidence of pulmonary  edema, consolidation, pneumothorax, nodule or pleural fluid. The heart size and mediastinal contours are within normal limits. IMPRESSION: Stable chronic lung disease.  No acute findings. Electronically Signed   By: Aletta Edouard M.D.   On: 06/01/2015 09:51   Dg Hip Unilat With Pelvis 2-3 Views Right  06/01/2015  CLINICAL DATA:  Pt c/o severe hip pain x 3hrs today s/p "sat down hard" when walking with his roller-walker this morning and slipped. EXAM: DG HIP (WITH OR WITHOUT PELVIS) 2-3V RIGHT COMPARISON:  04/10/2015 FINDINGS: There is a fracture of the right femoral neck, mid cervical, mildly comminuted and displaced, with the distal fracture component displacing superiorly by approximately 1 cm. There is no significant varus or valgus angulation. There is mild apex anterior angulation. Bones are diffusely demineralized. No other fractures. Hip joints are normally aligned. IMPRESSION: Mildly displaced fracture of the right femoral neck. Electronically Signed   By: Lajean Manes M.D.   On: 06/01/2015 08:36   Dg Femur, Min 2 Views Right  06/01/2015  CLINICAL DATA:  One hip pain for 3 hours today after sitting. EXAM: RIGHT FEMUR 2 VIEWS COMPARISON:  None. FINDINGS: The proximal right femur is captured on today' s hip  radiographs, and appears to show a femoral neck fracture. No femoral shaft or distal femoral fracture is identified. The distal margin of the right lateral femoral condyle is not entirely included on the frontal view and both views are somewhat oblique. Bony demineralization is present. I cannot exclude a small knee effusion. Degenerative spurring in the knee. IMPRESSION: 1. Osteoarthritis in the knee along with bony demineralization. 2. The proximal femur is shown on the hip radiographs and demonstrates a femoral neck fracture. Electronically Signed   By: Van Clines M.D.   On: 06/01/2015 08:38    EKG: Independently reviewed. Sinus, sinus tach with some PA PVCs but no ischemic changes noted  Assessment/Plan Principal Problem:   Femoral neck fracture, right, closed, initial encounter [facility] Safe Perioperative index based on ASA classification ~3 = estimate her risk of cardiac complication 5. 7% -Baseline functional status is relatively poor but without surgery agree with orthopedics inpu that patient is at much higher risk for morbidity without repair -NOt sure what AC to use if hemoglobin persitintly low.  Would monitor and conisder use of asa only post-op for DVt prohylaxis -He is moderate to high risk given alcoholism and other complicating confounding factors including A. fib and mild kidney disease but from a general standpoint he is cleared for surgery if needed   Anemia -appears to be microcytic although not really in keeping with alcoholism -Prior hemoglobin was 11 back in 11/2014 therefore recheck in a.m. with anemia panel JEHOVAH witness and cannot give blood so may need to give IV iron if hemoglobin drops  Moderate hyponatremia, hypokalemia and stage 3 CKD -We will replace with IV saline 75 cc per hour -Obtain urine sodium, urine creatinine and osmolality -Should not be an impediment for surgery as long as replacement continues -Check magnesium as well -Note that patient is on  Lasix which should be held - note that this was restarted by his PCP 2/24 when labs were last checked and seemed to be better and this coincides with his decline in renal function/16    GOUT -May continue colchicine 1 tablet when necessary/twice a day if needed    PROSTATE SPECIFIC ANTIGEN, ELEVATED -Outpatient management by urology     Continuous chronic alcoholism ([place]) -Patient drinks about 1 pint a day  -will  continue the Ativan protocol    Medically noncompliant -Patient is noncompliant with general medical recommendations  -Note that patient still continues to drink     Nodule of left lung -Outpatient management     Persistent atrial fibrillation ([place]), [place] score at least coronaries based on Myoview stress test earlier this year  -Continue Cardizem with sip of water 240 mg  -Not a candidate for Coumadin given falls and other issues      Hypotonic bladder -Status post TURB by urology earlier this year -Keep indwelling Foley   Bruise to mouth - unclear if this is secondary to fall? -Monitor   Lovenox for now but may need to switch to SCDs drops hemoglobin  Presumed full code Discussed briefly with wife at the he bedside Admit to telemetry  Verlon Au [facility] Pager [phone]  If 7PM-7AM, please contact night-coverage [email or web address] Password [email or web address] 06/01/2015, 11:20 AM

## 2015-06-01 NOTE — Progress Notes (Signed)
OT Cancellation Note  Patient Details Name: RISHIT BURKHALTER MRN: [id number] DOB: 08-28-1932   Cancelled Treatment:    Reason Eval/Treat Not Completed: Pt transferred to [facility] for surgery.    Darlina Rumpf [place]iqu[place], OTR/L [phone]  06/01/2015, 4:49 PM

## 2015-06-01 NOTE — Progress Notes (Signed)
Report called to [facility] [address] at [facility]. Pt transferred via Diplomatic Services operational officer. Family & belongings with pt. No changes in assessment. Layth Cerezo, [employer]

## 2015-06-01 NOTE — Anesthesia Preprocedure Evaluation (Deleted)
Anesthesia Evaluation  Patient identified by MRN, date of birth, ID band Patient awake    Reviewed: Allergy & Precautions, NPO status , Patient's Chart, lab work & pertinent test results  Airway Mallampati: II  TM Distance: >3 FB Neck ROM: Full    Dental no notable dental hx. (+) Poor Dentition, Missing   Pulmonary neg pulmonary ROS, former smoker,    Pulmonary exam normal breath sounds clear to auscultation       Cardiovascular hypertension, Pt. on medications Normal cardiovascular exam+ dysrhythmias Atrial Fibrillation  Rhythm:Regular Rate:Normal  09/2014 result with low risk myocardial stress test Echo 2016 with EF preserved at 55-60%, mild AI, mild-mod central MR, mild to mod TR  Pt does appear to always be in A fib vs paroxysmal afib   Neuro/Psych PSYCHIATRIC DISORDERS negative neurological ROS     GI/Hepatic (+) Cirrhosis     substance abuse  alcohol use,   Endo/Other  negative endocrine ROS  Renal/GU negative Renal ROS  negative genitourinary   Musculoskeletal  (+) Arthritis ,   Abdominal   Peds negative pediatric ROS (+)  Hematology  (+) anemia , JEHOVAH'S WITNESS  Anesthesia Other Findings Jehovah's witness so he refuses to even be type and screened, no blood products  Unsure about albumin or cell saver  In this patient I would recommend the following for reduced intraop and post blood loss: 1) cell saver intraop 2) tranexamic acid intraop 3) keep normothermic during procedure 4) induced hypotension during surgery 5) if able will attempt spinal as this can have lower incidence of major intraop blood loss per literature  His electrolyte status needs to be improved prior to surgery (specifically Na >130, K>3.0 and preferably above 3.5, Cl >90) to avoid risk of fatal arrythmia in periop period  Reproductive/Obstetrics negative OB ROS                            Anesthesia  Physical  Anesthesia Plan  ASA: III  Anesthesia Plan: Spinal   Post-op Pain Management:    Induction:   Airway Management Planned: Simple Face Mask  Additional Equipment:   Intra-op Plan:   Post-operative Plan:   Informed Consent: I have reviewed the patients History and Physical, chart, labs and discussed the procedure including the risks, benefits and alternatives for the proposed anesthesia with the patient or authorized representative who has indicated his/her understanding and acceptance.   Dental advisory given  Plan Discussed with: CRNA, Anesthesiologist and Surgeon  Anesthesia Plan Comments:         Anesthesia Quick Evaluation

## 2015-06-01 NOTE — Progress Notes (Signed)
[facility] contacted to arrange transfer to [facility]. Lile Mccurley, [employer]

## 2015-06-01 NOTE — ED Notes (Signed)
Patient is a Sales promotion account executive witness and is currently refusing blood. Lab called for type and screen cancel.

## 2015-06-01 NOTE — Progress Notes (Addendum)
Just received call from charge nurse at [place] at [facility]. She states that they are unable to accommodate patient in the OR this weekend (due to Cell Saver request), and recommended transfer to [facility]. Will contact RN on patient's floor to initiate transfer process to hospitalist at [facility]. Cont NPO.

## 2015-06-01 NOTE — ED Provider Notes (Signed)
CSN: [id number]     Arrival date & time 06/01/15  2778 History   First MD Initiated Contact with Patient 06/01/15 [phone]     Chief Complaint  Patient presents with  . Fall  . Hip Pain     (Consider location/radiation/quality/duration/timing/severity/associated sxs/prior Treatment) HPI Comments: 81 y.o. Male with history of gout, arthritis, alcoholic cirrhosis, frequent falls presents for right hip pain.  The patient states that he was moving linens to the couch from a cart he uses in his home and that when he turned he lost his balance and fell.  He reports that this is not unusual for him.  Denies head injury.  Reports the fall was on carpet.  His wife was home with him and called EMS because he was not able to get back up.  Has not been able to bear weight on the right leg since the fall.  Denies hip or knee surgery.  Patient is a 81 y.o. male presenting with fall and hip pain.  Fall Pertinent negatives include no chest pain, no abdominal pain, no headaches and no shortness of breath.  Hip Pain Pertinent negatives include no chest pain, no abdominal pain, no headaches and no shortness of breath.    Past Medical History  Diagnosis Date  . Anemia     NOS  . Arthritis   . Gout   . Hyperlipidemia   . Hypertension   . GERD (gastroesophageal reflux disease)   . Elevated PSA     multiple times, refused urology eval [facility] notes)  . ETOH abuse   . Medically noncompliant     noncompliant with follow ups, labs.  . Alcoholism ([place]) 06/25/2011  . Cirrhosis, alcoholic ([place]) 5/36/1443  . 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 Paroxysmal atrial fibrillation (H[facility])   . Bilateral lower extremity edema     Chronic.  Marland Kitchen History of empyema of pleura 12/2011    s/p VATS decortication  . Dysrhythmia     hx of PAF  . Foley catheter in place   . Falls frequently   . 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 Chronic indwelling Foley catheter 04/12/2015   Past Surgical History  Procedure Laterality Date   . Lipoma excision    . Transthoracic echocardiogram  09/03/2014    Normal overall LV size. EF 55-60% with no regional W. MA. Trivial AI. Mild-moderate MR. Mild R. and LA dilation. Mild to moderate TR. PA pressures ~47 mmHg  . Video assisted thoracoscopy (vats)/empyema  12/2011  . Nm myoview ltd  10/12/14    LOW RISK - NO ISCHEMIA, DIAPHRAGMATIC ATTENUATION, NON-GATED  . Green light laser turp (transurethral resection of prostate N/A 12/21/2014    Procedure: GREEN LIGHT LASER TURP (TRANSURETHRAL RESECTION OF PROSTATE;  Surgeon: Ardis Hughs, MD;  Location: [facility]S;  Service: Urology;  Laterality: N/A;   Family History  Problem Relation Age of Onset  . Diabetes Mellitus II Father   . Stroke Mother    Social History  Substance Use Topics  . Smoking status: Former Smoker -- 0.25 packs/day for 29 years    Types: Cigarettes    Quit date: 07/20/1974  . Smokeless tobacco: Never Used     Comment: Remote- quit 45 years ago.  . Alcohol Use: 0.0 oz/week    0 Standard drinks or equivalent per week     Comment: states that he drinks "couple pints of gin" every day.  nnow has only had maybe one shot of June in the last  few weeks.    Review of Systems  Constitutional: Negative for fever, chills, appetite change and fatigue.  HENT: Negative for congestion, postnasal drip and rhinorrhea.   Eyes: Negative for pain and redness.  Respiratory: Negative for cough, chest tightness and shortness of breath.   Cardiovascular: Negative for chest pain, palpitations and leg swelling.  Gastrointestinal: Negative for nausea, vomiting, abdominal pain, diarrhea and constipation.  Genitourinary: Negative for dysuria, urgency, frequency and flank pain.  Musculoskeletal: Positive for arthralgias (right hip pain) and gait problem. Negative for myalgias, back pain, neck pain and neck stiffness.  Skin: Negative for rash.  Neurological: Negative for dizziness, syncope, weakness, light-headedness and headaches.   Hematological: Does not bruise/bleed easily.      Allergies  Other; Ace inhibitors; Aspirin; Lisinopril; and Rocephin  Home Medications   Prior to Admission medications   Medication Sig Start Date End Date Taking? Authorizing Provider  acetaminophen-codeine (TYLENOL #3) 300-30 MG per tablet Take 1 tablet by mouth every 6 (six) hours as needed for moderate pain. 04/10/15  Yes Owens Loffler, MD  colchicine 0.6 MG tablet take 1 tablet by mouth twice a day if needed Patient taking differently: take 1 tablet by mouth twice a day if needed for gout. 12/05/14  Yes Spencer Copland, MD  diltiazem (CARDIZEM CD) 240 MG 24 hr capsule Take 1 capsule (240 mg total) by mouth daily. 09/26/14  Yes Leonie Man, MD  furosemide (LASIX) 40 MG tablet Take 1 tablet (40 mg total) by mouth daily. 09/26/14  Yes Leonie Man, MD  mirtazapine (REMERON) 15 MG tablet Take 1 tablet (15 mg total) by mouth at bedtime. 04/10/15  Yes Spencer Copland, MD   BP 117/63 mmHg  Pulse 100  Temp(Src) 98.6 F (37 C) (Oral)  Resp 16  Ht '6\' 3"'$  (1.905 m)  Wt 163 lb (73.936 kg)  BMI 20.37 kg/m2  SpO2 100% Physical Exam  Constitutional: He is oriented to person, place, and time. He appears well-developed and well-nourished. No distress.  HENT:  Head: Normocephalic and atraumatic.  Right Ear: External ear normal. No hemotympanum.  Left Ear: External ear normal. No hemotympanum.  Nose: Nose normal. No nasal septal hematoma. No epistaxis.  Mouth/Throat: Oropharynx is clear and moist. No oropharyngeal exudate.  Eyes: EOM are normal. Pupils are equal, round, and reactive to light.  Neck: Normal range of motion and full passive range of motion without pain. Neck supple. No spinous process tenderness and no muscular tenderness present. Normal range of motion present.  Cardiovascular: Normal rate, regular rhythm and intact distal pulses.   Pulmonary/Chest: Effort normal. No respiratory distress. He has no wheezes. He has no rales.  He exhibits no tenderness.  Abdominal: Soft. He exhibits no distension. There is no tenderness. There is no rebound.  Musculoskeletal: He exhibits no edema.       Right shoulder: Normal.       Left shoulder: Normal.       Right elbow: Normal.      Left elbow: Normal.       Right wrist: Normal.       Left wrist: Normal.       Right hip: He exhibits decreased range of motion (secondary to pain), tenderness and bony tenderness. He exhibits normal strength, no swelling, no crepitus and no deformity.       Left hip: Normal.       Right knee: Normal.       Left knee: Normal.  Right ankle: Normal.       Left ankle: Normal.  Neurological: He is alert and oriented to person, place, and time. He has normal strength. No cranial nerve deficit or sensory deficit. He exhibits normal muscle tone.  Skin: Skin is warm and dry. No abrasion, no bruising, no ecchymosis and no rash noted. He is not diaphoretic.  Vitals reviewed.   ED Course  Procedures (including critical care time) Labs Review Labs Reviewed  CBC WITH DIFFERENTIAL/PLATELET - Abnormal; Notable for the following:    RBC 3.07 (*)    Hemoglobin 8.6 (*)    HCT 24.5 (*)    RDW 18.0 (*)    Monocytes Absolute 1.4 (*)    All other components within normal limits  BASIC METABOLIC PANEL - Abnormal; Notable for the following:    Sodium 126 (*)    Potassium 2.9 (*)    Chloride 89 (*)    Creatinine, Ser 1.49 (*)    Calcium 7.9 (*)    GFR calc non Af Amer 43 (*)    GFR calc Af Amer 49 (*)    All other components within normal limits   Imaging Review Dg Chest Portable 1 View  06/01/2015  CLINICAL DATA:  Fall when right hip fracture. Preoperative respiratory exam. EXAM: PORTABLE CHEST 1 VIEW COMPARISON:  12/18/2014 FINDINGS: Stable chronic lung disease and elevation of the right hemidiaphragm. There is no evidence of pulmonary edema, consolidation, pneumothorax, nodule or pleural fluid. The heart size and mediastinal contours are within  normal limits. IMPRESSION: Stable chronic lung disease.  No acute findings. Electronically Signed   By: Aletta Edouard M.D.   On: 06/01/2015 09:51   Dg Hip Unilat With Pelvis 2-3 Views Right  06/01/2015  CLINICAL DATA:  Pt c/o severe hip pain x 3hrs today s/p "sat down hard" when walking with his roller-walker this morning and slipped. EXAM: DG HIP (WITH OR WITHOUT PELVIS) 2-3V RIGHT COMPARISON:  04/10/2015 FINDINGS: There is a fracture of the right femoral neck, mid cervical, mildly comminuted and displaced, with the distal fracture component displacing superiorly by approximately 1 cm. There is no significant varus or valgus angulation. There is mild apex anterior angulation. Bones are diffusely demineralized. No other fractures. Hip joints are normally aligned. IMPRESSION: Mildly displaced fracture of the right femoral neck. Electronically Signed   By: Lajean Manes M.D.   On: 06/01/2015 08:36   Dg Femur, Min 2 Views Right  06/01/2015  CLINICAL DATA:  One hip pain for 3 hours today after sitting. EXAM: RIGHT FEMUR 2 VIEWS COMPARISON:  None. FINDINGS: The proximal right femur is captured on today' s hip radiographs, and appears to show a femoral neck fracture. No femoral shaft or distal femoral fracture is identified. The distal margin of the right lateral femoral condyle is not entirely included on the frontal view and both views are somewhat oblique. Bony demineralization is present. I cannot exclude a small knee effusion. Degenerative spurring in the knee. IMPRESSION: 1. Osteoarthritis in the knee along with bony demineralization. 2. The proximal femur is shown on the hip radiographs and demonstrates a femoral neck fracture. Electronically Signed   By: Van Clines M.D.   On: 06/01/2015 08:38   I have personally reviewed and evaluated these images and lab results as part of my medical decision-making.   EKG Interpretation None      MDM  Patient seen and evaluated in stable condition.   Mechanical fall.  Xrays with right femoral neck fracture.  Labs with hypokalemia and hyponatremia and patient supplemented with potassium and given NS.  NPO at this time for possible surgical intervention.  Discussed with ortho on the phone who saw patient at bedside.  Discussed with hospitalist on call who agreed with patient admission.  Patient admitted with orthopedics following in consultation. Final diagnoses:  Femoral neck fracture, right, closed, initial encounter    1. Femoral neck fracture  2. Hyponatremia  3. Hypokalemia    Harvel Quale, MD 06/01/15 2238

## 2015-06-02 ENCOUNTER — Encounter (HOSPITAL_COMMUNITY)
Admission: EM | Disposition: A | Discharge status: Skilled Nursing Facility | Payer: Self-pay | Source: Home / Self Care | Attending: Internal Medicine

## 2015-06-02 ENCOUNTER — Encounter (HOSPITAL_COMMUNITY): Payer: Self-pay | Admitting: Certified Registered Nurse Anesthetist

## 2015-06-02 LAB — BASIC METABOLIC PANEL
ANION GAP: 7 (ref 5–15)
Anion gap: 9 (ref 5–15)
BUN: 11 mg/dL (ref 6–20)
BUN: 12 mg/dL (ref 6–20)
CALCIUM: 7.6 mg/dL — AB (ref 8.9–10.3)
CHLORIDE: 97 mmol/L — AB (ref 101–111)
CO2: 27 mmol/L (ref 22–32)
CO2: 25 mmol/L (ref 22–32)
Calcium: 7.8 mg/dL — ABNORMAL LOW (ref 8.9–10.3)
Chloride: 96 mmol/L — ABNORMAL LOW (ref 101–111)
Creatinine, Ser: 1.31 mg/dL — ABNORMAL HIGH (ref 0.61–1.24)
Creatinine, Ser: 1.28 mg/dL — ABNORMAL HIGH (ref 0.61–1.24)
GFR calc Af Amer: 58 mL/min — ABNORMAL LOW (ref >60)
GFR, EST AFRICAN AMERICAN: 59 mL/min — AB (ref >60)
GFR, EST NON AFRICAN AMERICAN: 50 mL/min — AB (ref >60)
GFR, EST NON AFRICAN AMERICAN: 51 mL/min — AB (ref >60)
GLUCOSE: 81 mg/dL (ref 65–99)
Glucose, Bld: 115 mg/dL — ABNORMAL HIGH (ref 65–99)
POTASSIUM: 3.6 mmol/L (ref 3.5–5.1)
Potassium: 3.5 mmol/L (ref 3.5–5.1)
SODIUM: 130 mmol/L — AB (ref 135–145)
SODIUM: 131 mmol/L — AB (ref 135–145)

## 2015-06-02 LAB — CBC
HCT: 20.7 % — ABNORMAL LOW (ref 39.0–52.0)
Hemoglobin: 7.4 g/dL — ABNORMAL LOW (ref 13.0–17.0)
MCH: 29.1 pg (ref 26.0–34.0)
MCHC: 35.7 g/dL (ref 30.0–36.0)
MCV: 81.5 fL (ref 78.0–100.0)
PLATELETS: 176 10*3/uL (ref 150–400)
RBC: 2.54 MIL/uL — AB (ref 4.22–5.81)
RDW: 17.9 % — AB (ref 11.5–15.5)
WBC: 6.9 10*3/uL (ref 4.0–10.5)

## 2015-06-02 LAB — MAGNESIUM: MAGNESIUM: 1.3 mg/dL — AB (ref 1.7–2.4)

## 2015-06-02 SURGERY — ARTHROPLASTY, HIP, TOTAL, ANTERIOR APPROACH
Anesthesia: General | Laterality: Right

## 2015-06-02 MED ORDER — SODIUM CHLORIDE 0.9 % IV SOLN
510.0000 mg | Freq: Once | INTRAVENOUS | Status: AC
  Administered 2015-06-02: 510 mg via INTRAVENOUS
  Filled 2015-06-02 (×2): qty 17

## 2015-06-02 MED ORDER — THIAMINE HCL 100 MG/ML IJ SOLN: 100.0000 mg | Freq: Every day | INTRAMUSCULAR | Status: DC

## 2015-06-02 MED ORDER — FENTANYL CITRATE (PF) 250 MCG/5ML IJ SOLN
INTRAMUSCULAR | Status: AC
  Filled 2015-06-02: qty 5

## 2015-06-02 MED ORDER — PROPOFOL 10 MG/ML IV BOLUS
INTRAVENOUS | Status: AC
  Filled 2015-06-02: qty 20

## 2015-06-02 MED ORDER — LORAZEPAM 2 MG/ML IJ SOLN: 1.0000 mg | Freq: Four times a day (QID) | INTRAMUSCULAR | Status: AC | PRN

## 2015-06-02 MED ORDER — ADULT MULTIVITAMIN W/MINERALS CH: 1.0000 | ORAL_TABLET | Freq: Every day | ORAL | Status: DC

## 2015-06-02 MED ORDER — FOLIC ACID 1 MG PO TABS
1.0000 mg | ORAL_TABLET | Freq: Every day | ORAL | Status: DC
  Administered 2015-06-02 – 2015-06-04 (×3): 1 mg via ORAL
  Filled 2015-06-02 (×3): qty 1

## 2015-06-02 MED ORDER — LORAZEPAM 1 MG PO TABS: 1.0000 mg | ORAL_TABLET | Freq: Four times a day (QID) | ORAL | Status: AC | PRN

## 2015-06-02 MED ORDER — HYDROMORPHONE HCL 1 MG/ML IJ SOLN: 0.2500 mg | INTRAMUSCULAR | Status: DC | PRN

## 2015-06-02 MED ORDER — BOOST / RESOURCE BREEZE PO LIQD: 1.0000 | Freq: Three times a day (TID) | ORAL | Status: DC

## 2015-06-02 MED ORDER — MAGNESIUM SULFATE 50 % IJ SOLN
3.0000 g | Freq: Two times a day (BID) | INTRAVENOUS | Status: AC
  Administered 2015-06-02 (×2): 3 g via INTRAVENOUS
  Filled 2015-06-02 (×3): qty 6

## 2015-06-02 MED ORDER — ONDANSETRON HCL 4 MG/2ML IJ SOLN: 4.0000 mg | Freq: Once | INTRAMUSCULAR | Status: DC | PRN

## 2015-06-02 MED ORDER — BOOST / RESOURCE BREEZE PO LIQD
1.0000 | Freq: Three times a day (TID) | ORAL | Status: DC
  Administered 2015-06-02 – 2015-06-03 (×4): 1 via ORAL

## 2015-06-02 MED ORDER — MEPERIDINE HCL 25 MG/ML IJ SOLN: 6.2500 mg | INTRAMUSCULAR | Status: DC | PRN

## 2015-06-02 MED ORDER — ENSURE ENLIVE PO LIQD: 237.0000 mL | Freq: Two times a day (BID) | ORAL | Status: DC

## 2015-06-02 MED ORDER — VITAMIN B-1 100 MG PO TABS
100.0000 mg | ORAL_TABLET | Freq: Every day | ORAL | Status: DC
  Administered 2015-06-02 – 2015-06-05 (×4): 100 mg via ORAL
  Filled 2015-06-02 (×4): qty 1

## 2015-06-02 NOTE — Progress Notes (Signed)
PT Cancellation Note  Patient Details Name: Bob Wilson MRN: [id number] DOB: 1933-07-03   Cancelled Treatment:    Reason Eval/Treat Not Completed: Patient not medically ready   Transferred to [facility] from [facility] yesterday for surgery;  To OR today,  Will follow up for PT eval postop;   Roney Marion, [place]  Acute Rehabilitation Services Pager [phone] Office [phone]    Roney Marion [facility] 06/02/2015, 7:26 AM

## 2015-06-02 NOTE — Progress Notes (Signed)
[facility]                                                                              Patient Demographics  Bob Wilson, is a 81 y.o. male, DOB - [birth year], IHK:[id number]  Admit date - 06/01/2015   Admitting Physician Nita Sells, MD  Outpatient Primary MD for the patient is Owens Loffler, MD  LOS - 1   Chief Complaint  Patient presents with  . Fall  . Hip Pain       Brief HPI   [name], Jehovah's Witness, with atrial fibrillation not on anticoagulation, gout, COPD, anemia, prior chronic alcohol use, cirrhosis, gout presented to ED with fall and right hip fracture.  In ED, workup showed Sodium 126, potassium 2.9, BUN/creatinine 12/1.4, hemoglobin 8.6, platelet count 234, white count 8.9, INR 1.01 chest x-ray with no pneumonia. EKG unremarkable.  Orthopedics was consulted. Patient was transferred from [facility] to [facility] for possible surgery for his right hip fracture.  Assessment & Plan    Principal Problem:   Femoral neck fracture, right, closed, initial encounter - Patient was cleared for surgery however due to metabolic abnormalities and anemia, currently on hold - Orthopedics consulted - Surgery canceled today due to hemoglobin of 7.4, patient Jehovah's Witness, does not accept blood products. Some of the anemia is likely from hemodilution due to IV fluids from last 24 hours. I will place him on IV Feraheme infusion today, since patient is back on regular diet, DC IV fluids - Anemia panel had shown anemia of chronic disease and folic acid deficiency. Will also place on folic acid daily.  Active Problems: Acute on chronic anemia :  - No hematochezia, melena or any obvious bleeding, patient has some component of hemodilution -  Anemia panel showed anemia of chronic disease and folic acid deficiency. Will also place on folic acid daily. - Placed on IV Feraheme infusion, DC IV fluids as patient is on regular  diet.  Hyponatremia - Improving, patient was on Lasix prior to admission, held  Hypokalemia, hypomagnesemia - Replaced    GOUT - Currently stable    PROSTATE SPECIFIC ANTIGEN, ELEVATED - Outpatient management by urology  Alcohol use - Currently not in any withdrawals, drinks about 1 pint a day, placed on CIWA protocol    Nodule of left lung - Outpatient management deferred to PCP    Persistent atrial fibrillation ([place]) - Continue Cardizem - Not a candidate for Coumadin given falls, alcoholism  Code Status: Full CODE STATUS  Family Communication: Discussed in detail with the patient, all imaging results, lab results explained to the patient and wife at the bedside   Disposition Plan:   Time Spent in minutes  25 minutes  Procedures  None  Consults   Orthopedics  DVT Prophylaxis SCD's  Medications  Scheduled Meds: . colchicine  0.6 mg Oral Daily  . diltiazem  240 mg Oral Daily  . enoxaparin (LOVENOX) injection  40 mg Subcutaneous Q24H  . feeding supplement  1 Container Oral TID BM  . ferumoxytol  510 mg Intravenous Once  . magnesium sulfate LVP 250-500 ml  3 g Intravenous BID  . mirtazapine  15 mg Oral QHS  . multivitamin with minerals  1 tablet Oral Daily  . thiamine  100 mg Intramuscular Once  . thiamine  100 mg Oral Daily   Continuous Infusions:  PRN Meds:.bisacodyl, HYDROcodone-acetaminophen, hydrOXYzine, loperamide, LORazepam, morphine injection, morphine injection, ondansetron, white petrolatum   Antibiotics   Anti-infectives    None        Subjective:   Bob Wilson was seen and examined today.  Disappointed that his surgery was canceled otherwise no acute complaints, pain 6/10. No fevers or chills. Patient denies dizziness, chest pain, shortness of breath, abdominal pain, N/V/D/C, new weakness, numbess, tingling. No acute events overnight.    Objective:   Blood pressure 131/77, pulse 87, temperature 98.8 F (37.1 C), temperature source  Oral, resp. rate 17, height '6\' 3"'$  (1.905 m), weight 73.936 kg (163 lb), SpO2 100 %.  Wt Readings from Last 3 Encounters:  06/01/15 73.936 kg (163 lb)  04/10/15 74.73 kg (164 lb 12 oz)  12/21/14 80.287 kg (177 lb)     Intake/Output Summary (Last 24 hours) at 06/02/15 1110 Last data filed at 06/02/15 0900  Gross per 24 hour  Intake    240 ml  Output    700 ml  Net   -460 ml    Exam  General: Alert and oriented x 3, NAD  HEENT:  PERRLA, EOMI, Anicteric Sclera, mucous membranes moist.   Neck: Supple, no JVD, no masses  CVS: S1 S2 auscultated, no rubs, murmurs or gallops. Regular rate and rhythm.  Respiratory: Clear to auscultation bilaterally, no wheezing, rales or rhonchi  Abdomen: Soft, nontender, nondistended, + bowel sounds  Ext: no cyanosis clubbing or edema  Neuro: no new deficits  Skin: No rashes  Psych: Normal affect and demeanor, alert and oriented x3    Data Review   Micro Results Recent Results (from the past 240 hour(s))  MRSA PCR Screening     Status: None   Collection Time: 06/01/15  5:35 PM  Result Value Ref Range Status   MRSA by PCR NEGATIVE NEGATIVE Final    Comment:        The GeneXpert MRSA Assay (FDA approved for NASAL specimens only), is one component of a comprehensive MRSA colonization surveillance program. It is not intended to diagnose MRSA infection nor to guide or monitor treatment for MRSA infections.     Radiology Reports Dg Chest Portable 1 View  06/01/2015  CLINICAL DATA:  Fall when right hip fracture. Preoperative respiratory exam. EXAM: PORTABLE CHEST 1 VIEW COMPARISON:  12/18/2014 FINDINGS: Stable chronic lung disease and elevation of the right hemidiaphragm. There is no evidence of pulmonary edema, consolidation, pneumothorax, nodule or pleural fluid. The heart size and mediastinal contours are within normal limits. IMPRESSION: Stable chronic lung disease.  No acute findings. Electronically Signed   By: Aletta Edouard  M.D.   On: 06/01/2015 09:51   Dg Hip Unilat With Pelvis 2-3 Views Right  06/01/2015  CLINICAL DATA:  Pt c/o severe hip pain x 3hrs today s/p "sat down hard" when walking with his roller-walker this morning and slipped. EXAM: DG HIP (WITH OR WITHOUT PELVIS) 2-3V RIGHT COMPARISON:  04/10/2015 FINDINGS: There is a fracture of the right femoral neck, mid cervical, mildly comminuted and displaced, with the distal fracture component displacing superiorly by approximately 1 cm. There is no significant varus or valgus angulation. There is mild apex anterior angulation. Bones are diffusely demineralized. No other fractures. Hip joints  are normally aligned. IMPRESSION: Mildly displaced fracture of the right femoral neck. Electronically Signed   By: Lajean Manes M.D.   On: 06/01/2015 08:36   Dg Femur, Min 2 Views Right  06/01/2015  CLINICAL DATA:  One hip pain for 3 hours today after sitting. EXAM: RIGHT FEMUR 2 VIEWS COMPARISON:  None. FINDINGS: The proximal right femur is captured on today' s hip radiographs, and appears to show a femoral neck fracture. No femoral shaft or distal femoral fracture is identified. The distal margin of the right lateral femoral condyle is not entirely included on the frontal view and both views are somewhat oblique. Bony demineralization is present. I cannot exclude a small knee effusion. Degenerative spurring in the knee. IMPRESSION: 1. Osteoarthritis in the knee along with bony demineralization. 2. The proximal femur is shown on the hip radiographs and demonstrates a femoral neck fracture. Electronically Signed   By: Van Clines M.D.   On: 06/01/2015 08:38    CBC  Recent Labs Lab 06/01/15 0858 06/02/15 0350  WBC 8.9 6.9  HGB 8.6* 7.4*  HCT 24.5* 20.7*  PLT 234 176  MCV 79.8 81.5  MCH 28.0 29.1  MCHC 35.1 35.7  RDW 18.0* 17.9*  LYMPHSABS 1.3  --   MONOABS 1.4*  --   EOSABS 0.1  --   BASOSABS 0.0  --     Chemistries   Recent Labs Lab 06/01/15 0858  06/01/15 1823 06/02/15 0350  NA 126* 129* 130*  K 2.9* 2.9* 3.5  CL 89* 92* 96*  CO2 '25 29 27  '$ GLUCOSE 78 95 81  BUN '12 10 11  '$ CREATININE 1.49* 1.40* 1.31*  CALCIUM 7.9* 7.7* 7.6*  MG  --   --  1.3*   ------------------------------------------------------------------------------------------------------------------ estimated creatinine clearance is 47 mL/min (by C-G formula based on Cr of 1.31). ------------------------------------------------------------------------------------------------------------------ No results for input(s): HGBA1C in the last 72 hours. ------------------------------------------------------------------------------------------------------------------ No results for input(s): CHOL, HDL, LDLCALC, TRIG, CHOLHDL, LDLDIRECT in the last 72 hours. ------------------------------------------------------------------------------------------------------------------ No results for input(s): TSH, T4TOTAL, T3FREE, THYROIDAB in the last 72 hours.  Invalid input(s): FREET3 ------------------------------------------------------------------------------------------------------------------  Recent Labs  06/01/15 1335  VITAMINB12 218  FOLATE 4.8*  FERRITIN 373*  TIBC 190*  IRON 164  RETICCTPCT 1.3    Coagulation profile  Recent Labs Lab 06/01/15 0945  INR 1.01    No results for input(s): DDIMER in the last 72 hours.  Cardiac Enzymes No results for input(s): CKMB, TROPONINI, MYOGLOBIN in the last 168 hours.  Invalid input(s): CK ------------------------------------------------------------------------------------------------------------------ Invalid input(s): POCBNP  No results for input(s): GLUCAP in the last 72 hours.   RAI,RIPUDEEP M.D. [facility] 06/02/2015, 11:10 AM  Pager: [phone] Between 7am to 7pm - call Pager - [phone]  After 7pm go to [email or web address] - password [email or web address]  Call night coverage person covering after 7pm

## 2015-06-02 NOTE — Progress Notes (Addendum)
Patient A/O, no noted distress. Pain has been managed by regimen (see emar). Tolerated all meds well. Initiate IV 20g left forearm. LBM 06/02/15. CIWA 0, shift assessment. Staff will continue to monitor and meet needs.

## 2015-06-02 NOTE — Anesthesia Preprocedure Evaluation (Deleted)
Anesthesia Evaluation  Patient identified by MRN, date of birth, ID band Patient awake    Reviewed: Allergy & Precautions, NPO status , Patient's Chart, lab work & pertinent test results  Airway Mallampati: I  TM Distance: >3 FB Neck ROM: Full    Dental   Pulmonary former smoker,    Pulmonary exam normal        Cardiovascular hypertension, Pt. on medications Normal cardiovascular exam     Neuro/Psych    GI/Hepatic GERD  Medicated and Controlled,  Endo/Other    Renal/GU      Musculoskeletal   Abdominal   Peds  Hematology  (+) anemia ,   Anesthesia Other Findings   Reproductive/Obstetrics                             Anesthesia Physical Anesthesia Plan  ASA: III  Anesthesia Plan: General   Post-op Pain Management:    Induction: Intravenous  Airway Management Planned: Oral ETT  Additional Equipment:   Intra-op Plan:   Post-operative Plan: Extubation in OR  Informed Consent: I have reviewed the patients History and Physical, chart, labs and discussed the procedure including the risks, benefits and alternatives for the proposed anesthesia with the patient or authorized representative who has indicated his/her understanding and acceptance.     Plan Discussed with: CRNA and Surgeon  Anesthesia Plan Comments:         Anesthesia Quick Evaluation

## 2015-06-02 NOTE — Progress Notes (Signed)
LCSW aware of consult for nursing home placement. Awaiting patient to become medically stable to understand baseline and needs for disposition. Will have weekday Social Worker to follow up with patient and medical team.    CSW following for needs and disposition  Lane Hacker, MSW Clinical Social Work: Emergency Room [phone]

## 2015-06-02 NOTE — Progress Notes (Signed)
OT Cancellation Note  Patient Details Name: DWAYNE BULKLEY MRN: [id number] DOB: [birth year]   Cancelled Treatment:    Reason Eval/Treat Not Completed: Patient not medically ready. Transferred to [facility] from [facility] yesterday for surgery;  To OR today. Will follow up for OT eval postop as appropriate.  Almon Register [phone] 06/02/2015, 7:30 AM

## 2015-06-02 NOTE — Progress Notes (Addendum)
Potassium corrected o/n. This am, Hgb is 7.4. I again had a lengthy conversation with the patient and his wife. They refuse blood products. He is high risk for bleeding with alcoholism and cirrhosis. After surgery, I would expect him to drop his hgb about another 3 grams. The risk of serious complications such as death is too high. Thus we agreed to cancel his surgery. We will pursue supportive care with bedrest and analgesia. I would recommend hematology consult for his anemia. After hgb is improved > 10 , we can reconsider surgery. Case discussed with Dr. Gladstone Lighter, who agrees.

## 2015-06-03 DIAGNOSIS — D638 Anemia in other chronic diseases classified elsewhere: Secondary | ICD-10-CM | POA: Diagnosis present

## 2015-06-03 LAB — BASIC METABOLIC PANEL
ANION GAP: 6 (ref 5–15)
BUN: 10 mg/dL (ref 6–20)
CHLORIDE: 96 mmol/L — AB (ref 101–111)
CO2: 28 mmol/L (ref 22–32)
Calcium: 7.5 mg/dL — ABNORMAL LOW (ref 8.9–10.3)
Creatinine, Ser: 1.12 mg/dL (ref 0.61–1.24)
GFR calc non Af Amer: >60 mL/min (ref >60)
Glucose, Bld: 115 mg/dL — ABNORMAL HIGH (ref 65–99)
Potassium: 3 mmol/L — ABNORMAL LOW (ref 3.5–5.1)
Sodium: 130 mmol/L — ABNORMAL LOW (ref 135–145)

## 2015-06-03 LAB — MAGNESIUM: MAGNESIUM: 2.6 mg/dL — AB (ref 1.7–2.4)

## 2015-06-03 LAB — HEMOGLOBIN AND HEMATOCRIT, BLOOD
HEMATOCRIT: 20.4 % — AB (ref 39.0–52.0)
Hemoglobin: 7.2 g/dL — ABNORMAL LOW (ref 13.0–17.0)

## 2015-06-03 MED ORDER — VITAMIN B-12 1000 MCG PO TABS
1000.0000 ug | ORAL_TABLET | Freq: Every day | ORAL | Status: DC
  Administered 2015-06-03 – 2015-06-04 (×2): 1000 ug via ORAL
  Filled 2015-06-03 (×2): qty 1

## 2015-06-03 MED ORDER — DARBEPOETIN ALFA 200 MCG/0.4ML IJ SOSY
200.0000 ug | PREFILLED_SYRINGE | Freq: Once | INTRAMUSCULAR | Status: AC
  Administered 2015-06-03: 200 ug via SUBCUTANEOUS
  Filled 2015-06-03: qty 0.4

## 2015-06-03 MED ORDER — DARBEPOETIN ALFA 100 MCG/0.5ML IJ SOSY: 500.0000 ug | PREFILLED_SYRINGE | Freq: Once | INTRAMUSCULAR | Status: DC

## 2015-06-03 MED ORDER — ENSURE ENLIVE PO LIQD
237.0000 mL | Freq: Three times a day (TID) | ORAL | Status: DC
  Administered 2015-06-04 (×2): 237 mL via ORAL

## 2015-06-03 MED ORDER — POTASSIUM CHLORIDE CRYS ER 20 MEQ PO TBCR
40.0000 meq | EXTENDED_RELEASE_TABLET | Freq: Once | ORAL | Status: AC
  Administered 2015-06-03: 40 meq via ORAL
  Filled 2015-06-03: qty 2

## 2015-06-03 NOTE — Progress Notes (Signed)
OT Cancellation Note  Patient Details Name: Bob Wilson MRN: [id number] DOB: 04-26-1933   Cancelled Treatment:    Reason Eval/Treat Not Completed: Patient not medically ready. Per PT, pt is not appropriate to get up OOB yet; MD confirmed. OT will sign off at this time. Please re-order when pt is more appropriate. Thank you.   Binnie Kand M.S., OTR/L Pager: [phone]  06/03/2015, 10:47 AM

## 2015-06-03 NOTE — Progress Notes (Signed)
   Subjective:  Patient reports pain as mild to moderate.  Pt states pain is well controlled. Surgery cancelled due to anemia.  Objective:   VITALS:   Filed Vitals:   06/02/15 1134 06/02/15 1500 06/02/15 2104 06/03/15 0440  BP: 118/73 126/68 114/64 100/52  Pulse: 93 106 98 92  Temp:  97.8 F (36.6 C) 98.8 F (37.1 C) 99 F (37.2 C)  TempSrc:  Oral Oral Oral  Resp:  '17 18 18  '$ Height:      Weight:      SpO2: 100% 100% 99% 99%   RLE: Shortened and externally rotated Skin intact Neurovascular intact   Lab Results  Component Value Date   WBC 6.9 06/02/2015   HGB 7.2* 06/03/2015   HCT 20.4* 06/03/2015   MCV 81.5 06/02/2015   PLT 176 06/02/2015   BMET    Component Value Date/Time   NA 130* 06/03/2015 0339   K 3.0* 06/03/2015 0339   CL 96* 06/03/2015 0339   CO2 28 06/03/2015 0339   GLUCOSE 115* 06/03/2015 0339   BUN 10 06/03/2015 0339   CREATININE 1.12 06/03/2015 0339   CALCIUM 7.5* 06/03/2015 0339   GFRNONAA >60 06/03/2015 0339   GFRAA >60 06/03/2015 0339     Assessment/Plan: 2 Days Post-Op   Principal Problem:   Femoral neck fracture, right, closed, initial encounter Active Problems:   GOUT   PROSTATE SPECIFIC ANTIGEN, ELEVATED   Continuous chronic alcoholism ([place])   Medically noncompliant   Nodule of left lung   Persistent atrial fibrillation (HCC)   Hypotonic bladder   Hip fracture (HCC)   NWB RLE Analgesia Anemia per primary team. Hgb 7.2 today. Refuses blood products. Dispo: d/c planning, will defer surgery until hgb > 10    Amalie Koran, Horald Pollen 06/03/2015, 7:31 AM   Rod Can, MD Cell [phone]

## 2015-06-03 NOTE — Progress Notes (Signed)
PT Cancellation Note  Patient Details Name: Bob Wilson MRN: [id number] DOB: Dec 29, 1932   Cancelled Treatment:    Reason Eval/Treat Not Completed: Patient not medically ready.  Called ortho MD Dr. Lyla Glassing, and he confirmed that it is not appropriate to get this pt up OOB yet.  PT will sign off.  Please re-order when pt more appropriate.    Thanks,    Barbarann Ehlers. Charlita Brian, PT, DPT [phone]   06/03/2015, 10:37 AM

## 2015-06-03 NOTE — Progress Notes (Signed)
Utilization review completed.  

## 2015-06-03 NOTE — Progress Notes (Signed)
[facility]                                                                              Patient Demographics  Bob Wilson, is a 81 y.o. male, DOB - 1932/08/22, UUV:[id number]  Admit date - 06/01/2015   Admitting Physician Nita Sells, MD  Outpatient Primary MD for the patient is Owens Loffler, MD  LOS - 2   Chief Complaint  Patient presents with  . Fall  . Hip Pain       Brief HPI   [name], Jehovah's Witness, with atrial fibrillation not on anticoagulation, gout, COPD, anemia, prior chronic alcohol use, cirrhosis, gout presented to ED with fall and right hip fracture.  In ED, workup showed Sodium 126, potassium 2.9, BUN/creatinine 12/1.4, hemoglobin 8.6, platelet count 234, white count 8.9, INR 1.01 chest x-ray with no pneumonia. EKG unremarkable.  Orthopedics was consulted. Patient was transferred from [facility] to [facility] for possible surgery for his right hip fracture.  Assessment & Plan    Principal Problem:   Femoral neck fracture, right, closed, initial encounter - Patient was cleared for surgery however due to metabolic abnormalities and anemia, currently on hold - Orthopedics consulted - Surgery canceled due to hemoglobin of 7.2, patient Jehovah's Witness, does not accept blood products. S/p IV Feraheme infusion 47/42, folic acid. - Spoke with Dr Earlie Server (hematology-Oncology), recommended Aranesp 542mg SQ Q3weeks, IV feraheme weekly (already given yesterday),  B12 and folate and outpatient follow-up to work-up and manage the chronic anemia as patient will not accept blood products. Per Dr MEarlie Server nothing else can be done to expedite this, hematology will follow patient in office in 7-10days. - Spoke with Dr SLyla Glassing surgery likely now in 4 weeks once Hb >10, out patient f/u, SNF placement. Placed SW consult     Active Problems: Acute on chronic anemia :  - No hematochezia, melena or any obvious bleeding,  patient has some component of hemodilution -  Anemia panel showed anemia of chronic disease and folic acid deficiency. Will also place on folic acid daily. - Placed on IV Feraheme infusion 11/13, aranesp today with B12, folate   Hyponatremia - tolerating solid diet, DC IVF  Hypokalemia - Replaced    GOUT - Currently stable    PROSTATE SPECIFIC ANTIGEN, ELEVATED - Outpatient management by urology  Alcohol use - Currently not in any withdrawals, drinks about 1 pint a day, placed on CIWA protocol    Nodule of left lung - Outpatient management deferred to PCP    Persistent atrial fibrillation ([place] - Continue Cardizem - Not a candidate for Coumadin given falls, alcoholism  Code Status: Full CODE STATUS  Family Communication: Discussed in detail with the patient, all imaging results, lab results explained to the patient    Disposition Plan: will need SNF  Time Spent in minutes  25 minutes  Procedures  None  Consults   Orthopedics  DVT Prophylaxis SCD's  Medications  Scheduled Meds: . darbepoetin (ARANESP) injection - NON-DIALYSIS  500 mcg Subcutaneous Once  . diltiazem  240 mg Oral Daily  . enoxaparin (LOVENOX) injection  40 mg  Subcutaneous Q24H  . feeding supplement  1 Container Oral TID BM  . folic acid  1 mg Oral Daily  . mirtazapine  15 mg Oral QHS  . multivitamin with minerals  1 tablet Oral Daily  . thiamine  100 mg Oral Daily   Or  . thiamine  100 mg Intravenous Daily   Continuous Infusions:  PRN Meds:.bisacodyl, HYDROcodone-acetaminophen, hydrOXYzine, loperamide, LORazepam **OR** LORazepam, morphine injection, morphine injection, ondansetron, white petrolatum   Antibiotics   Anti-infectives    None        Subjective:   Bob Wilson was seen and examined today.  Pain controlled. Patient denies dizziness, chest pain, shortness of breath, abdominal pain, N/V/D/C, new weakness, numbess, tingling. No acute events overnight.    Objective:    Blood pressure 100/52, pulse 92, temperature 99 F (37.2 C), temperature source Oral, resp. rate 18, height '6\' 3"'$  (1.905 m), weight 73.936 kg (163 lb), SpO2 99 %.  Wt Readings from Last 3 Encounters:  06/01/15 73.936 kg (163 lb)  04/10/15 74.73 kg (164 lb 12 oz)  12/21/14 80.287 kg (177 lb)     Intake/Output Summary (Last 24 hours) at 06/03/15 0749 Last data filed at 06/03/15 0445  Gross per 24 hour  Intake    840 ml  Output    700 ml  Net    140 ml    Exam  General: Alert and oriented x 3, NAD  HEENT:  PERRLA, EOMI  Neck: Supple, no JVD, no masses  CVS: S1 S2 clear, RRR  Respiratory: CTAB  Abdomen: Soft,NT, ND, NBS  Ext: no cyanosis clubbing or edema  Neuro: no new deficits  Skin: No rashes  Psych: Normal affect and demeanor, alert and oriented x3    Data Review   Micro Results Recent Results (from the past 240 hour(s))  MRSA PCR Screening     Status: None   Collection Time: 06/01/15  5:35 PM  Result Value Ref Range Status   MRSA by PCR NEGATIVE NEGATIVE Final    Comment:        The GeneXpert MRSA Assay (FDA approved for NASAL specimens only), is one component of a comprehensive MRSA colonization surveillance program. It is not intended to diagnose MRSA infection nor to guide or monitor treatment for MRSA infections.     Radiology Reports Dg Chest Portable 1 View  06/01/2015  CLINICAL DATA:  Fall when right hip fracture. Preoperative respiratory exam. EXAM: PORTABLE CHEST 1 VIEW COMPARISON:  12/18/2014 FINDINGS: Stable chronic lung disease and elevation of the right hemidiaphragm. There is no evidence of pulmonary edema, consolidation, pneumothorax, nodule or pleural fluid. The heart size and mediastinal contours are within normal limits. IMPRESSION: Stable chronic lung disease.  No acute findings. Electronically Signed   By: Aletta Edouard M.D.   On: 06/01/2015 09:51   Dg Hip Unilat With Pelvis 2-3 Views Right  06/01/2015  CLINICAL DATA:  Pt  c/o severe hip pain x 3hrs today s/p "sat down hard" when walking with his roller-walker this morning and slipped. EXAM: DG HIP (WITH OR WITHOUT PELVIS) 2-3V RIGHT COMPARISON:  04/10/2015 FINDINGS: There is a fracture of the right femoral neck, mid cervical, mildly comminuted and displaced, with the distal fracture component displacing superiorly by approximately 1 cm. There is no significant varus or valgus angulation. There is mild apex anterior angulation. Bones are diffusely demineralized. No other fractures. Hip joints are normally aligned. IMPRESSION: Mildly displaced fracture of the right femoral neck. Electronically Signed  By: Lajean Manes M.D.   On: 06/01/2015 08:36   Dg Femur, Min 2 Views Right  06/01/2015  CLINICAL DATA:  One hip pain for 3 hours today after sitting. EXAM: RIGHT FEMUR 2 VIEWS COMPARISON:  None. FINDINGS: The proximal right femur is captured on today' s hip radiographs, and appears to show a femoral neck fracture. No femoral shaft or distal femoral fracture is identified. The distal margin of the right lateral femoral condyle is not entirely included on the frontal view and both views are somewhat oblique. Bony demineralization is present. I cannot exclude a small knee effusion. Degenerative spurring in the knee. IMPRESSION: 1. Osteoarthritis in the knee along with bony demineralization. 2. The proximal femur is shown on the hip radiographs and demonstrates a femoral neck fracture. Electronically Signed   By: Van Clines M.D.   On: 06/01/2015 08:38    CBC  Recent Labs Lab 06/01/15 0858 06/02/15 0350 06/03/15 0339  WBC 8.9 6.9  --   HGB 8.6* 7.4* 7.2*  HCT 24.5* 20.7* 20.4*  PLT 234 176  --   MCV 79.8 81.5  --   MCH 28.0 29.1  --   MCHC 35.1 35.7  --   RDW 18.0* 17.9*  --   LYMPHSABS 1.3  --   --   MONOABS 1.4*  --   --   EOSABS 0.1  --   --   BASOSABS 0.0  --   --     Chemistries   Recent Labs Lab 06/01/15 0858 06/01/15 1823 06/02/15 0350  06/02/15 1230 06/03/15 0339  NA 126* 129* 130* 131* 130*  K 2.9* 2.9* 3.5 3.6 3.0*  CL 89* 92* 96* 97* 96*  CO2 '25 29 27 25 28  '$ GLUCOSE 78 95 81 115* 115*  BUN '12 10 11 12 10  '$ CREATININE 1.49* 1.40* 1.31* 1.28* 1.12  CALCIUM 7.9* 7.7* 7.6* 7.8* 7.5*  MG  --   --  1.3*  --  2.6*   ------------------------------------------------------------------------------------------------------------------ estimated creatinine clearance is 55 mL/min (by C-G formula based on Cr of 1.12). ------------------------------------------------------------------------------------------------------------------ No results for input(s): HGBA1C in the last 72 hours. ------------------------------------------------------------------------------------------------------------------ No results for input(s): CHOL, HDL, LDLCALC, TRIG, CHOLHDL, LDLDIRECT in the last 72 hours. ------------------------------------------------------------------------------------------------------------------ No results for input(s): TSH, T4TOTAL, T3FREE, THYROIDAB in the last 72 hours.  Invalid input(s): FREET3 ------------------------------------------------------------------------------------------------------------------  Recent Labs  06/01/15 1335  VITAMINB12 218  FOLATE 4.8*  FERRITIN 373*  TIBC 190*  IRON 164  RETICCTPCT 1.3    Coagulation profile  Recent Labs Lab 06/01/15 0945  INR 1.01    No results for input(s): DDIMER in the last 72 hours.  Cardiac Enzymes No results for input(s): CKMB, TROPONINI, MYOGLOBIN in the last 168 hours.  Invalid input(s): CK ------------------------------------------------------------------------------------------------------------------ Invalid input(s): POCBNP  No results for input(s): GLUCAP in the last 72 hours.   RAI,RIPUDEEP M.D. [facility] 06/03/2015, 7:49 AM  Pager: [phone] Between 7am to 7pm - call Pager - [phone]  After 7pm go to [email or web address] -  password [email or web address]  Call night coverage person covering after 7pm

## 2015-06-03 NOTE — Progress Notes (Signed)
Initial Nutrition Assessment  DOCUMENTATION CODES:   Not applicable  INTERVENTION:   -D/c Boost Breeze po TID, each supplement provides 250 kcal and 9 grams of protein -Ensure Enlive po TID, each supplement provides 350 kcal and 20 grams of protein  NUTRITION DIAGNOSIS:   Inadequate oral intake related to poor appetite as evidenced by meal completion < 50%.  GOAL:   Patient will meet greater than or equal to 90% of their needs  MONITOR:   PO intake, Supplement acceptance, Labs, Weight trends, Skin, I & O's  REASON FOR ASSESSMENT:   Consult Poor PO  ASSESSMENT:   Bob Wilson is a 81 y.o. male who complains of RIGHT hip pain. He was helping his wife with laundry early this morning when he lost his balance and fell onto his right side. Had immediate right hip pain and inability to weight bear. Is a household ambulator with a walker. Receives HHPT. Last PO intake yesterday.  Pt admitted with rt closed femoral neck fracture.   Orthopedics following; surgery has been cancelled due to anemia. Pt is a Jehovah's Witness and refusing blood products. Hematology also following (s/p feraheme infusion). Surgery is on hold until Hgb is >10.   Pt using bathroom at time of visit; unable to complete Nutrition-Focused physical exam at this time.   Appetite has been poor; PO: 5-50%. Pt refused AM dose of Boost Breeze. RD will order Ensure Enlive due to increased nutrient provision.   Wt hx reviewed; UBWL 175#, however, wt appears to fluctuate at baseline. No weight changes are significant for time frame.   Labs reviewed: Na: 130, K: 3.0. Pt is on MVI, thiamine, and B-12 supplementation. Also receiving Remeron.  Diet Order:  Diet regular Room service appropriate?: Yes; Fluid consistency:: Thin  Skin:  Reviewed, no issues  Last BM:  PTA  Height:   Ht Readings from Last 1 Encounters:  06/01/15 '6\' 3"'$  (1.905 m)    Weight:   Wt Readings from Last 1 Encounters:  06/01/15 163 lb  (73.936 kg)    Ideal Body Weight:  89.1 kg  BMI:  Body mass index is 20.37 kg/(m^2).  Estimated Nutritional Needs:   Kcal:  2000-2200  Protein:  95-105 grams  Fluid:  2.0-2.2 L  EDUCATION NEEDS:   No education needs identified at this time  Bob Wilson A. Jimmye Norman, RD, LDN, CDE Pager: [phone] After hours Pager: [phone]

## 2015-06-04 LAB — BASIC METABOLIC PANEL
ANION GAP: 6 (ref 5–15)
BUN: 12 mg/dL (ref 6–20)
CALCIUM: 7.8 mg/dL — AB (ref 8.9–10.3)
CHLORIDE: 98 mmol/L — AB (ref 101–111)
CO2: 28 mmol/L (ref 22–32)
Creatinine, Ser: 1.33 mg/dL — ABNORMAL HIGH (ref 0.61–1.24)
GFR calc non Af Amer: 49 mL/min — ABNORMAL LOW (ref >60)
GFR, EST AFRICAN AMERICAN: 57 mL/min — AB (ref >60)
GLUCOSE: 92 mg/dL (ref 65–99)
Potassium: 4.1 mmol/L (ref 3.5–5.1)
Sodium: 132 mmol/L — ABNORMAL LOW (ref 135–145)

## 2015-06-04 LAB — HEMOGLOBIN AND HEMATOCRIT, BLOOD
HEMATOCRIT: 20.1 % — AB (ref 39.0–52.0)
Hemoglobin: 7.1 g/dL — ABNORMAL LOW (ref 13.0–17.0)

## 2015-06-04 NOTE — Clinical Social Work Note (Signed)
Clinical Social Work Assessment  Patient Details  Name: Bob Wilson MRN: [id number] Date of Birth: [birth year]  Date of referral:  06/04/15               Reason for consult:  Facility Placement, Discharge Planning                Permission sought to share information with:  Facility Sport and exercise psychologist, Family Supports Permission granted to share information::  Yes, Verbal Permission Granted  Name::     Bob Wilson  Agency::  [facility] SNF (preference [name])  Relationship::  Wife  Contact Information:  [phone]  Housing/Transportation Living arrangements for the past 2 months:  [place] of Information:  Patient, Spouse Patient Interpreter Needed:  None Criminal Activity/Legal Involvement Pertinent to Current Situation/Hospitalization:  No - Comment as needed Significant Relationships:  Spouse Lives with:  Spouse Do you feel safe going back to the place where you live?  No (High fall risk. Non-weight bearing per Ortho MD orders.) Need for family participation in patient care:  Yes (Comment) (Patient's wife active in patient's care.)  Care giving concerns:  Patient and patient's wife expressed concern regarding patient returning home once medically stable. Patient currently non-weight bearing while awaiting improvement of hemoglobin levels. Patient and patient's wife unable to privately pay for SNF placement, agreeable to returning home with [place] and hospital bed if needed.   Social Worker assessment / plan:  CSW received referral for possible SNF placement at time of discharge. CSW met with patient and patient's wife at bedside to discuss discharge planning. Per patient's wife, family would prefer for patient to discharge to [facility] once medically stable for discharge. CSW informed patient and patient's wife of barrier to discharge to SNF as the following: currently patient NWB due to fracture, patient unable to work with PT due to  NWB order which does not qualify patient as having a skillable need for SNF. CSW informed patient and patient's wife that per facility admissions liaisons, patient could potentially qualify for up to 5 days of SNF placement, but then would no longer have a skillable need for SNF placement. Patient and patient's wife expressed understanding and stated patient would return home after insurance was no longer able to finance SNF placement. CSW to continue to follow and assist with discharge planning needs.  Employment status:  Retired Science writer) PT Recommendations:  [place] / Referral to community resources:  [place]  Patient/Family's Response to care:  Patient and patient's family understanding to CSW plan of care.  Patient/Family's Understanding of and Emotional Response to Diagnosis, Current Treatment, and Prognosis:  Patient and patient's family understanding to CSW plan of care.  Emotional Assessment Appearance:  Appears stated age Attitude/Demeanor/Rapport:  Other (Pleasant.) Affect (typically observed):  Accepting, Appropriate, Pleasant, Quiet, Calm Orientation:  Oriented to Self, Oriented to Place, Oriented to  Time, Oriented to Situation Alcohol / Substance use:  Not Applicable Psych involvement (Current and /or in the community):  No (Comment) (Not appropriate on this admission.)  Discharge Needs  Concerns to be addressed:  No discharge needs identified Readmission within the last 30 days:  No Current discharge risk:  None Barriers to Discharge:  No Barriers Identified   Caroline Sauger, LCSW 06/04/2015, 2:58 PM [phone]

## 2015-06-04 NOTE — Care Management Important Message (Signed)
Important Message  Patient Details  Name: Bob Wilson MRN: [id number] Date of Birth: [birth year]   Medicare Important Message Given:  Yes    Opie Maclaughlin P Kambrie Eddleman 06/04/2015, 12:05 PM

## 2015-06-04 NOTE — Evaluation (Signed)
Physical Therapy Evaluation Patient Details Name: Bob Wilson MRN: [id number] DOB: 06/05/1933 Today's Date: 06/04/2015   History of Present Illness  81 y.o. male admitted to [facility] on 06/01/15 after falling and sustaining a R a R femoral neck fx.  Pt was found to be anemic and unable to undergo surgical intervention at this time due to anemia.  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  Pt with significant PMHx of anemia, gout, arthritis, HTN, ETOH abuse, cirrhosis, PAF, bil LE edema, frequent falls, hypotonic bladder, and chronic indwelling foley catheter.  Clinical Impression  Pt is bed bound due to active R hip fx that they cannot operate on until his Hgb is higher.  Pt is bed bound and not allowed to mobilize per ortho MD recommendations.  He is generally weak in bil upper extremities, and weak and stiff in his left leg.  He has poor sensation in his feet and has a history of frequent falls at home.  His wife is unable to care for him at this time.  He will need SNF placement until he can have his right leg stabilized surgically.   PT gave pt and his wife HEP for his left leg.  All other therapy services deferred to SNF at this time.     Follow Up Recommendations SNF    Equipment Recommendations  Hospital bed    Recommendations for Other Services   NA    Precautions / Restrictions Precautions Precautions: Fall Restrictions RLE Weight Bearing: Non weight bearing Other Position/Activity Restrictions: PT paged ortho physician who reports we should not try to mobilie this patient at this time.        Mobility  Bed Mobility               General bed mobility comments: NT due to unstabilized right hip fracture.   Transfers                 General transfer comment: NT due to unstabilized right hip fracture.   Ambulation/Gait              General Gait Details: NT due to unstabilized right hip fracture.          Balance Overall balance assessment: History of Falls (NT due to unstabilized right hip fracture. )                                           Pertinent Vitals/Pain Pain Assessment: Faces Faces Pain Scale: Hurts little more Pain Location: right leg Pain Descriptors / Indicators: Guarding;Grimacing Pain Intervention(s): Limited activity within patient's tolerance;Monitored during session    Home Living Family/patient expects to be discharged to:: Private residence Living Arrangements: Spouse/significant other Available Help at Discharge: Family;Other (Comment) (3-4 days per week wife works 9am-3 pm) Type of Home: House Home Access: Ramped entrance     Home Layout: One level Home Equipment: Bedside commode;Walker - 4 wheels      Prior Function Level of Independence: Independent with assistive device(s)         Comments: uses rollator "half the time"     Hand Dominance   Dominant Hand: Right    Extremity/Trunk Assessment   Upper Extremity Assessment: Generalized weakness (4/5 per bed level MMT bil)  Lower Extremity Assessment: RLE deficits/detail;LLE deficits/detail RLE Deficits / Details: right leg only assessed toe motion, ankle DF/PF 2+/5 and sensation (decreased to LT in feet).  LLE Deficits / Details: left leg with generalized weakness, ankle DF/PF 3/5, knee flexion/extension grossly 3-/5, hip flexion 2+/5  Cervical / Trunk Assessment: Normal (only assessed supine in the bed)  Communication   Communication: HOH  Cognition Arousal/Alertness: Awake/alert Behavior During Therapy: WFL for tasks assessed/performed Overall Cognitive Status: Impaired/Different from baseline (family not willing to say in fromt of patient,) Area of Impairment: Orientation;Memory Orientation Level: Disoriented to;Time ("April")   Memory: Decreased short-term memory (reliant on  wife to answer history questions)         General Comments: Pt with poor memory.        Exercises Total Joint Exercises Ankle Circles/Pumps: AROM;Both;10 reps;Supine Quad Sets: AROM;Left;5 reps;Supine Heel Slides: AAROM;Left;10 reps;Supine Hip ABduction/ADduction: AROM;Left;10 reps;Supine Straight Leg Raises: AAROM;Left;10 reps;Supine Other Exercises Other Exercises: HEP handouts given to pt and wife.       Assessment/Plan    PT Assessment All further PT needs can be met in the next venue of care  PT Diagnosis Difficulty walking;Abnormality of gait;Generalized weakness;Acute pain;Altered mental status   PT Problem List Decreased strength;Decreased range of motion;Decreased activity tolerance;Decreased balance;Decreased mobility;Decreased cognition;Decreased knowledge of use of DME;Decreased safety awareness;Decreased knowledge of precautions;Pain;Impaired sensation  PT Treatment Interventions     PT Goals (Current goals can be found in the Care Plan section) Acute Rehab PT Goals Patient Stated Goal: to go home PT Goal Formulation: All assessment and education complete, DC therapy               End of Session   Activity Tolerance: Patient limited by pain Patient left: in bed;with call bell/phone within reach;with family/visitor present           Time: 1223-1247 PT Time Calculation (min) (ACUTE ONLY): 24 min   Charges:   PT Evaluation $Initial PT Evaluation Tier I: 1 Procedure PT Treatments $Therapeutic Exercise: 8-22 mins        Bob Wilson B. Bob Wilson, PT, DPT [phone]   06/04/2015, 1:27 PM

## 2015-06-04 NOTE — Progress Notes (Signed)
[facility]                                                                              Patient Demographics  Bob Wilson, is a 81 y.o. male, DOB - Jul 03, 1933, EXB:[id number]  Admit date - 06/01/2015   Admitting Physician Nita Sells, MD  Outpatient Primary MD for the patient is Owens Loffler, MD  LOS - 3   Chief Complaint  Patient presents with  . Fall  . Hip Pain       Brief HPI   [name], Jehovah's Witness, with atrial fibrillation not on anticoagulation, gout, COPD, anemia, prior chronic alcohol use, cirrhosis, gout presented to ED with fall and right hip fracture.  In ED, workup showed Sodium 126, potassium 2.9, BUN/creatinine 12/1.4, hemoglobin 8.6, platelet count 234, white count 8.9, INR 1.01 chest x-ray with no pneumonia. EKG unremarkable.  Orthopedics was consulted. Patient was transferred from [facility] to [facility] for possible surgery for his right hip fracture.  Orthopedics consulted, Right hip surgery canceled due to anemia  Assessment & Plan    Principal Problem:   Femoral neck fracture, right, closed, initial encounter - Patient was cleared for surgery however due to metabolic abnormalities and anemia, but surgery canceled due to anemia - Orthopedics consulted, Dr Lyla Glassing, recommended surgery in 4 weeks once hemoglobin> 10, outpatient F/U, SNF placement -  Spoke with Dr Earlie Server (hematology-Oncology), recommended Aranesp 574mg SQ Q3weeks (given 11/14), IV feraheme weekly (given 11/13),  B12 and folate and outpatient follow-up to work-up and manage the chronic anemia as patient will not accept blood products. Per Dr MEarlie Server nothing else can be done to expedite this, follow-up patient in office in 7-10days.   Active Problems: Acute on chronic anemia :  - No hematochezia, melena or any obvious bleeding, patient has some component of hemodilution -  Anemia panel showed anemia of chronic disease and folic  acid deficiency - Placed on Aranesp, IV Feraheme infusion, aranesp with B12, folate   Hyponatremia - Improving, tolerating solid diet    GOUT - Currently stable    PROSTATE SPECIFIC ANTIGEN, ELEVATED - Outpatient management by urology  Alcohol use - Currently not in any withdrawals, drinks about 1 pint a day, placed on CIWA protocol    Nodule of left lung - Outpatient management deferred to PCP    Persistent atrial fibrillation ([place] - Continue Cardizem - Not a candidate for Coumadin given falls, alcoholism  Code Status: Full CODE STATUS  Family Communication: Discussed in detail with the patient, all imaging results, lab results explained to the patient in detail   Disposition Plan: Awaiting skilled nursing facility, SW aware. Needs PT notes to fax to facilities  Time Spent in minutes  25 minutes  Procedures  None  Consults   Orthopedics  DVT Prophylaxis SCD's  Medications  Scheduled Meds: . diltiazem  240 mg Oral Daily  . feeding supplement (ENSURE ENLIVE)  237 mL Oral TID BM  . folic acid  1 mg Oral Daily  . mirtazapine  15 mg Oral QHS  . multivitamin with minerals  1 tablet Oral Daily  . thiamine  100 mg Oral Daily   Or  . thiamine  100 mg Intravenous Daily  . vitamin B-12  1,000 mcg Oral Daily   Continuous Infusions:  PRN Meds:.bisacodyl, HYDROcodone-acetaminophen, hydrOXYzine, loperamide, LORazepam **OR** LORazepam, morphine injection, morphine injection, ondansetron, white petrolatum   Antibiotics   Anti-infectives    None        Subjective:   Bob Wilson was seen and examined today. Patient denies dizziness, chest pain, shortness of breath, abdominal pain, N/V/D/C, new weakness, numbess, tingling. No acute events overnight.  Patient reports pain is controlled as long as he is not moving.  Objective:   Blood pressure 91/53, pulse 89, temperature 98.1 F (36.7 C), temperature source Oral, resp. rate 17, height '6\' 3"'$  (1.905 m), weight  73.936 kg (163 lb), SpO2 100 %.  Wt Readings from Last 3 Encounters:  06/01/15 73.936 kg (163 lb)  04/10/15 74.73 kg (164 lb 12 oz)  12/21/14 80.287 kg (177 lb)     Intake/Output Summary (Last 24 hours) at 06/04/15 1135 Last data filed at 06/04/15 0810  Gross per 24 hour  Intake    360 ml  Output    425 ml  Net    -65 ml    Exam  General: Alert and oriented x 3, NAD  HEENT:  PERRLA, EOMI  Neck: Supple, no JVD, no masses  CVS: S1 S2 clear, RRR  Respiratory: CTAB  Abdomen: Soft,NT, ND, NBS  Ext: no cyanosis clubbing or edema  Neuro: no new deficits  Skin: No rashes  Psych: Normal affect and demeanor, alert and oriented x3    Data Review   Micro Results Recent Results (from the past 240 hour(s))  MRSA PCR Screening     Status: None   Collection Time: 06/01/15  5:35 PM  Result Value Ref Range Status   MRSA by PCR NEGATIVE NEGATIVE Final    Comment:        The GeneXpert MRSA Assay (FDA approved for NASAL specimens only), is one component of a comprehensive MRSA colonization surveillance program. It is not intended to diagnose MRSA infection nor to guide or monitor treatment for MRSA infections.     Radiology Reports Dg Chest Portable 1 View  06/01/2015  CLINICAL DATA:  Fall when right hip fracture. Preoperative respiratory exam. EXAM: PORTABLE CHEST 1 VIEW COMPARISON:  12/18/2014 FINDINGS: Stable chronic lung disease and elevation of the right hemidiaphragm. There is no evidence of pulmonary edema, consolidation, pneumothorax, nodule or pleural fluid. The heart size and mediastinal contours are within normal limits. IMPRESSION: Stable chronic lung disease.  No acute findings. Electronically Signed   By: Aletta Edouard M.D.   On: 06/01/2015 09:51   Dg Hip Unilat With Pelvis 2-3 Views Right  06/01/2015  CLINICAL DATA:  Pt c/o severe hip pain x 3hrs today s/p "sat down hard" when walking with his roller-walker this morning and slipped. EXAM: DG HIP (WITH  OR WITHOUT PELVIS) 2-3V RIGHT COMPARISON:  04/10/2015 FINDINGS: There is a fracture of the right femoral neck, mid cervical, mildly comminuted and displaced, with the distal fracture component displacing superiorly by approximately 1 cm. There is no significant varus or valgus angulation. There is mild apex anterior angulation. Bones are diffusely demineralized. No other fractures. Hip joints are normally aligned. IMPRESSION: Mildly displaced fracture of the right femoral neck. Electronically Signed   By: Lajean Manes M.D.   On: 06/01/2015 08:36   Dg Femur, Min 2 Views Right  06/01/2015  CLINICAL DATA:  One hip  pain for 3 hours today after sitting. EXAM: RIGHT FEMUR 2 VIEWS COMPARISON:  None. FINDINGS: The proximal right femur is captured on today' s hip radiographs, and appears to show a femoral neck fracture. No femoral shaft or distal femoral fracture is identified. The distal margin of the right lateral femoral condyle is not entirely included on the frontal view and both views are somewhat oblique. Bony demineralization is present. I cannot exclude a small knee effusion. Degenerative spurring in the knee. IMPRESSION: 1. Osteoarthritis in the knee along with bony demineralization. 2. The proximal femur is shown on the hip radiographs and demonstrates a femoral neck fracture. Electronically Signed   By: Van Clines M.D.   On: 06/01/2015 08:38    CBC  Recent Labs Lab 06/01/15 0858 06/02/15 0350 06/03/15 0339 06/04/15 0547  WBC 8.9 6.9  --   --   HGB 8.6* 7.4* 7.2* 7.1*  HCT 24.5* 20.7* 20.4* 20.1*  PLT 234 176  --   --   MCV 79.8 81.5  --   --   MCH 28.0 29.1  --   --   MCHC 35.1 35.7  --   --   RDW 18.0* 17.9*  --   --   LYMPHSABS 1.3  --   --   --   MONOABS 1.4*  --   --   --   EOSABS 0.1  --   --   --   BASOSABS 0.0  --   --   --     Chemistries   Recent Labs Lab 06/01/15 1823 06/02/15 0350 06/02/15 1230 06/03/15 0339 06/04/15 0547  NA 129* 130* 131* 130* 132*  K  2.9* 3.5 3.6 3.0* 4.1  CL 92* 96* 97* 96* 98*  CO2 '29 27 25 28 28  '$ GLUCOSE 95 81 115* 115* 92  BUN '10 11 12 10 12  '$ CREATININE 1.40* 1.31* 1.28* 1.12 1.33*  CALCIUM 7.7* 7.6* 7.8* 7.5* 7.8*  MG  --  1.3*  --  2.6*  --    ------------------------------------------------------------------------------------------------------------------ estimated creatinine clearance is 46.3 mL/min (by C-G formula based on Cr of 1.33). ------------------------------------------------------------------------------------------------------------------ No results for input(s): HGBA1C in the last 72 hours. ------------------------------------------------------------------------------------------------------------------ No results for input(s): CHOL, HDL, LDLCALC, TRIG, CHOLHDL, LDLDIRECT in the last 72 hours. ------------------------------------------------------------------------------------------------------------------ No results for input(s): TSH, T4TOTAL, T3FREE, THYROIDAB in the last 72 hours.  Invalid input(s): FREET3 ------------------------------------------------------------------------------------------------------------------  Recent Labs  06/01/15 1335  VITAMINB12 218  FOLATE 4.8*  FERRITIN 373*  TIBC 190*  IRON 164  RETICCTPCT 1.3    Coagulation profile  Recent Labs Lab 06/01/15 0945  INR 1.01    No results for input(s): DDIMER in the last 72 hours.  Cardiac Enzymes No results for input(s): CKMB, TROPONINI, MYOGLOBIN in the last 168 hours.  Invalid input(s): CK ------------------------------------------------------------------------------------------------------------------ Invalid input(s): POCBNP  No results for input(s): GLUCAP in the last 72 hours.   Denario Bagot M.D. [facility] 06/04/2015, 11:35 AM  Pager: [phone] Between 7am to 7pm - call Pager - [phone]  After 7pm go to [email or web address] - password [email or web address]  Call night coverage person covering after  7pm

## 2015-06-04 NOTE — Clinical Social Work Placement (Signed)
   CLINICAL SOCIAL WORK PLACEMENT  NOTE  Date:  06/04/2015  Patient Details  Name: Bob Wilson MRN: [id number] Date of Birth: 1932/12/27  Clinical Social Work is seeking post-discharge placement for this patient at the [place] level of care (*CSW will initial, date and re-position this form in  chart as items are completed):  Yes   Patient/family provided with [facility] Work Department's list of facilities offering this level of care within the geographic area requested by the patient (or if unable, by the patient's family).  Yes   Patient/family informed of their freedom to choose among providers that offer the needed level of care, that participate in Medicare, Medicaid or managed care program needed by the patient, have an available bed and are willing to accept the patient.  Yes   Patient/family informed of [facility]'s ownership interest in [facility] and [facility], as well as of the fact that they are under no obligation to receive care at these facilities.  PASRR submitted to EDS on       PASRR number received on       Existing PASRR number confirmed on 06/04/15     FL2 transmitted to all facilities in geographic area requested by pt/family on 06/04/15     FL2 transmitted to all facilities within larger geographic area on       Patient informed that his/her managed care company has contracts with or will negotiate with certain facilities, including the following:            Patient/family informed of bed offers received.  Patient chooses bed at       Physician recommends and patient chooses bed at      Patient to be transferred to   on  .  Patient to be transferred to facility by       Patient family notified on   of transfer.  Name of family member notified:        PHYSICIAN Please sign FL2     Additional Comment:    _______________________________________________ Caroline Sauger, LCSW 06/04/2015, 3:05  PM

## 2015-06-04 NOTE — NC FL2 (Signed)
[place] LEVEL OF CARE SCREENING TOOL     IDENTIFICATION  Patient Name: Bob Wilson Birthdate: December 22, 1932 Sex: male Admission Date (Current Location): 06/01/2015  [facility] and [place] Number:     Facility and Address:  The . [facility], [place] [address] [place], [place] [address]      Provider Number: [id number]  Attending Physician Name and Address:  Mendel Corning, MD  Relative Name and Phone Number:  Bob Wilson, [phone]    Current Level of Care: Hospital Recommended Level of Care: [place] Prior Approval Number:    Date Approved/Denied:   PASRR Number: [id number]  Discharge Plan: SNF    Current Diagnoses: Patient Active Problem List   Diagnosis Date Noted  . Anemia, chronic disease 06/03/2015  . Femoral neck fracture, right, closed, initial encounter 06/01/2015  . Hip fracture ([place]) 06/01/2015  . Chronic indwelling Foley catheter 04/12/2015  . 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 Persistent atrial fibrillation ([place]) 11/05/2014  . Bilateral lower extremity edema   . Complicated UTI (urinary tract infection)-indwelling Foley catheter. 09/02/2014  . Hypokalemia 09/02/2014  . Atrial fibrillation with RVR ([place]) 09/02/2014  . Sepsis secondary to UTI ([place]) 09/02/2014  . Protein-calorie malnutrition ([place]) 08/15/2013  . Cirrhosis, alcoholic ([place]) 78/24/2353  . 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 Empyema of pleura ([place]) 01/05/2012  . Nodule of left lung 01/05/2012  . Continuous chronic alcoholism ([place]) 06/25/2011  . Medically noncompliant 06/25/2011  . PROSTATE SPECIFIC ANTIGEN, ELEVATED 08/07/2010  . INGUINAL HERNIA, RIGHT 07/11/2010  . GOUT 02/11/2009  . Essential hypertension 02/11/2009  . Dyslipidemia 05/21/2008  . ANEMIA-NOS 05/21/2008  . GERD 05/21/2008  . DEGENERATIVE JOINT DISEASE, KNEE 05/21/2008    Orientation ACTIVITIES/SOCIAL BLADDER RESPIRATION    Self, Time, Situation, Place  Family  supportive Continent Normal  BEHAVIORAL SYMPTOMS/MOOD NEUROLOGICAL BOWEL NUTRITION STATUS  Other (Comment) (n/a)  (n/a) Continent  (Please see discharge summary.)  PHYSICIAN VISITS COMMUNICATION OF NEEDS Height & Weight Skin    Verbally '6\' 3"'$  (190.5 cm) 163 lbs. Normal          AMBULATORY STATUS RESPIRATION     (Non-weight bearing per Ortho MD orders.) Normal      Personal Care Assistance Level of Assistance  Feeding, Bathing, Dressing Bathing Assistance: Maximum assistance Feeding assistance: Independent Dressing Assistance: Maximum assistance      Functional Limitations Info   (Non-weight bearing per Ortho MD orders.)             SPECIAL CARE FACTORS FREQUENCY  PT (By licensed PT), OT (By licensed OT)     PT Frequency: 5 OT Frequency: 5           Additional Factors Info  Code Status, Allergies, Psychotropic Code Status Info: FULL Allergies Info: NO BLOOD PRODUCTS (Jehovah's Witness), Ace Inhibitors, Aspirin, Lisinopril, Rocephin Ceftriaxone Sodium In Dextrose Psychotropic Info: Ativan         Current Medications (06/04/2015): Current Facility-Administered Medications  Medication Dose Route Frequency Provider Last Rate Last Dose  . bisacodyl (DULCOLAX) EC tablet 5 mg  5 mg Oral Daily PRN Nita Sells, MD      . diltiazem (CARDIZEM CD) 24 hr capsule 240 mg  240 mg Oral Daily Nita Sells, MD   240 mg at 06/04/15 0940  . feeding supplement (ENSURE ENLIVE) (ENSURE ENLIVE) liquid 237 mL  237 mL Oral TID BM Jenifer A Williams, RD   237 mL at 06/04/15 0947  . folic acid (FOLVITE) tablet 1  mg  1 mg Oral Daily Ripudeep K Rai, MD   1 mg at 06/04/15 0940  . HYDROcodone-acetaminophen (NORCO/VICODIN) 5-325 MG per tablet 1-2 tablet  1-2 tablet Oral Q6H PRN Nita Sells, MD   2 tablet at 06/04/15 0950  . LORazepam (ATIVAN) tablet 1 mg  1 mg Oral Q6H PRN Ripudeep Krystal Eaton, MD       Or  . LORazepam (ATIVAN) injection 1 mg  1 mg Intravenous Q6H PRN Ripudeep  K Rai, MD      . mirtazapine (REMERON) tablet 15 mg  15 mg Oral QHS Nita Sells, MD   15 mg at 06/03/15 2257  . morphine 2 MG/ML injection 0.5 mg  0.5 mg Intravenous Q2H PRN Nita Sells, MD      . morphine 4 MG/ML injection 4 mg  4 mg Intravenous Q2H PRN Harvel Quale, MD   4 mg at 06/03/15 0854  . multivitamin with minerals tablet 1 tablet  1 tablet Oral Daily Nita Sells, MD   1 tablet at 06/04/15 0940  . thiamine (VITAMIN B-1) tablet 100 mg  100 mg Oral Daily Ripudeep K Rai, MD   100 mg at 06/04/15 0940  . vitamin B-12 (CYANOCOBALAMIN) tablet 1,000 mcg  1,000 mcg Oral Daily Ripudeep Krystal Eaton, MD   1,000 mcg at 06/04/15 0940  . white petrolatum (VASELINE) gel   Topical PRN Harvel Quale, MD   0.2 application at 62/56/38 1729   Do not use this list as official medication orders. Please verify with discharge summary.  Discharge Medications:   Medication List    ASK your doctor about these medications        acetaminophen-codeine 300-30 MG tablet  Commonly known as:  TYLENOL #3  Take 1 tablet by mouth every 6 (six) hours as needed for moderate pain.     colchicine 0.6 MG tablet  take 1 tablet by mouth twice a day if needed     diltiazem 240 MG 24 hr capsule  Commonly known as:  CARDIZEM CD  Take 1 capsule (240 mg total) by mouth daily.     furosemide 40 MG tablet  Commonly known as:  LASIX  Take 1 tablet (40 mg total) by mouth daily.     mirtazapine 15 MG tablet  Commonly known as:  REMERON  Take 1 tablet (15 mg total) by mouth at bedtime.        Relevant Imaging Results:  Relevant Lab Results:  Recent Labs    Additional Information Social Security #: [id number]  Bob Wilson [phone]

## 2015-06-05 DIAGNOSIS — Z531 Procedure and treatment not carried out because of patient's decision for reasons of belief and group pressure: Secondary | ICD-10-CM | POA: Diagnosis not present

## 2015-06-05 DIAGNOSIS — L89152 Pressure ulcer of sacral region, stage 2: Secondary | ICD-10-CM | POA: Diagnosis not present

## 2015-06-05 DIAGNOSIS — R52 Pain, unspecified: Secondary | ICD-10-CM | POA: Diagnosis not present

## 2015-06-05 DIAGNOSIS — L89159 Pressure ulcer of sacral region, unspecified stage: Secondary | ICD-10-CM | POA: Diagnosis not present

## 2015-06-05 DIAGNOSIS — E46 Unspecified protein-calorie malnutrition: Secondary | ICD-10-CM | POA: Diagnosis not present

## 2015-06-05 DIAGNOSIS — D473 Essential (hemorrhagic) thrombocythemia: Secondary | ICD-10-CM | POA: Diagnosis not present

## 2015-06-05 DIAGNOSIS — I481 Persistent atrial fibrillation: Secondary | ICD-10-CM

## 2015-06-05 DIAGNOSIS — Z87440 Personal history of urinary (tract) infections: Secondary | ICD-10-CM | POA: Diagnosis not present

## 2015-06-05 DIAGNOSIS — R972 Elevated prostate specific antigen [PSA]: Secondary | ICD-10-CM

## 2015-06-05 DIAGNOSIS — D638 Anemia in other chronic diseases classified elsewhere: Secondary | ICD-10-CM

## 2015-06-05 DIAGNOSIS — N390 Urinary tract infection, site not specified: Secondary | ICD-10-CM | POA: Diagnosis not present

## 2015-06-05 DIAGNOSIS — R32 Unspecified urinary incontinence: Secondary | ICD-10-CM | POA: Diagnosis not present

## 2015-06-05 DIAGNOSIS — Z87891 Personal history of nicotine dependence: Secondary | ICD-10-CM | POA: Diagnosis not present

## 2015-06-05 DIAGNOSIS — N312 Flaccid neuropathic bladder, not elsewhere classified: Secondary | ICD-10-CM

## 2015-06-05 DIAGNOSIS — R0602 Shortness of breath: Secondary | ICD-10-CM | POA: Diagnosis not present

## 2015-06-05 DIAGNOSIS — D72829 Elevated white blood cell count, unspecified: Secondary | ICD-10-CM | POA: Diagnosis not present

## 2015-06-05 DIAGNOSIS — D649 Anemia, unspecified: Secondary | ICD-10-CM | POA: Diagnosis not present

## 2015-06-05 DIAGNOSIS — D519 Vitamin B12 deficiency anemia, unspecified: Secondary | ICD-10-CM | POA: Diagnosis not present

## 2015-06-05 DIAGNOSIS — M109 Gout, unspecified: Secondary | ICD-10-CM | POA: Diagnosis not present

## 2015-06-05 DIAGNOSIS — E876 Hypokalemia: Secondary | ICD-10-CM | POA: Diagnosis not present

## 2015-06-05 DIAGNOSIS — Z681 Body mass index (BMI) 19 or less, adult: Secondary | ICD-10-CM | POA: Diagnosis not present

## 2015-06-05 DIAGNOSIS — E538 Deficiency of other specified B group vitamins: Secondary | ICD-10-CM | POA: Diagnosis not present

## 2015-06-05 DIAGNOSIS — S72001A Fracture of unspecified part of neck of right femur, initial encounter for closed fracture: Secondary | ICD-10-CM | POA: Diagnosis not present

## 2015-06-05 DIAGNOSIS — Z7401 Bed confinement status: Secondary | ICD-10-CM | POA: Diagnosis not present

## 2015-06-05 DIAGNOSIS — S72001D Fracture of unspecified part of neck of right femur, subsequent encounter for closed fracture with routine healing: Secondary | ICD-10-CM | POA: Diagnosis not present

## 2015-06-05 DIAGNOSIS — N189 Chronic kidney disease, unspecified: Secondary | ICD-10-CM | POA: Diagnosis not present

## 2015-06-05 DIAGNOSIS — Z791 Long term (current) use of non-steroidal anti-inflammatories (NSAID): Secondary | ICD-10-CM | POA: Diagnosis not present

## 2015-06-05 DIAGNOSIS — R410 Disorientation, unspecified: Secondary | ICD-10-CM | POA: Diagnosis not present

## 2015-06-05 DIAGNOSIS — F1020 Alcohol dependence, uncomplicated: Secondary | ICD-10-CM | POA: Diagnosis not present

## 2015-06-05 DIAGNOSIS — R296 Repeated falls: Secondary | ICD-10-CM | POA: Diagnosis not present

## 2015-06-05 DIAGNOSIS — R918 Other nonspecific abnormal finding of lung field: Secondary | ICD-10-CM | POA: Diagnosis not present

## 2015-06-05 DIAGNOSIS — D6959 Other secondary thrombocytopenia: Secondary | ICD-10-CM | POA: Diagnosis not present

## 2015-06-05 DIAGNOSIS — R05 Cough: Secondary | ICD-10-CM | POA: Diagnosis not present

## 2015-06-05 DIAGNOSIS — R911 Solitary pulmonary nodule: Secondary | ICD-10-CM

## 2015-06-05 DIAGNOSIS — I959 Hypotension, unspecified: Secondary | ICD-10-CM | POA: Diagnosis not present

## 2015-06-05 DIAGNOSIS — R630 Anorexia: Secondary | ICD-10-CM | POA: Diagnosis not present

## 2015-06-05 DIAGNOSIS — Z9289 Personal history of other medical treatment: Secondary | ICD-10-CM | POA: Diagnosis not present

## 2015-06-05 DIAGNOSIS — I129 Hypertensive chronic kidney disease with stage 1 through stage 4 chronic kidney disease, or unspecified chronic kidney disease: Secondary | ICD-10-CM | POA: Diagnosis not present

## 2015-06-05 DIAGNOSIS — E785 Hyperlipidemia, unspecified: Secondary | ICD-10-CM | POA: Diagnosis not present

## 2015-06-05 DIAGNOSIS — A419 Sepsis, unspecified organism: Secondary | ICD-10-CM | POA: Diagnosis not present

## 2015-06-05 DIAGNOSIS — I4891 Unspecified atrial fibrillation: Secondary | ICD-10-CM | POA: Diagnosis not present

## 2015-06-05 DIAGNOSIS — D631 Anemia in chronic kidney disease: Secondary | ICD-10-CM | POA: Diagnosis not present

## 2015-06-05 DIAGNOSIS — I10 Essential (primary) hypertension: Secondary | ICD-10-CM | POA: Diagnosis not present

## 2015-06-05 DIAGNOSIS — K7030 Alcoholic cirrhosis of liver without ascites: Secondary | ICD-10-CM | POA: Diagnosis not present

## 2015-06-05 DIAGNOSIS — R531 Weakness: Secondary | ICD-10-CM | POA: Diagnosis present

## 2015-06-05 DIAGNOSIS — M25551 Pain in right hip: Secondary | ICD-10-CM | POA: Diagnosis not present

## 2015-06-05 LAB — BASIC METABOLIC PANEL
ANION GAP: 8 (ref 5–15)
BUN: 15 mg/dL (ref 6–20)
CALCIUM: 8 mg/dL — AB (ref 8.9–10.3)
CHLORIDE: 96 mmol/L — AB (ref 101–111)
CO2: 28 mmol/L (ref 22–32)
Creatinine, Ser: 1.4 mg/dL — ABNORMAL HIGH (ref 0.61–1.24)
GFR calc non Af Amer: 46 mL/min — ABNORMAL LOW (ref >60)
GFR, EST AFRICAN AMERICAN: 53 mL/min — AB (ref >60)
GLUCOSE: 93 mg/dL (ref 65–99)
Potassium: 3.9 mmol/L (ref 3.5–5.1)
Sodium: 132 mmol/L — ABNORMAL LOW (ref 135–145)

## 2015-06-05 LAB — HEMOGLOBIN AND HEMATOCRIT, BLOOD
HEMATOCRIT: 20.1 % — AB (ref 39.0–52.0)
Hemoglobin: 7.2 g/dL — ABNORMAL LOW (ref 13.0–17.0)

## 2015-06-05 MED ORDER — DARBEPOETIN ALFA 500 MCG/ML IJ SOSY: 500.0000 ug | PREFILLED_SYRINGE | INTRAMUSCULAR | Status: AC

## 2015-06-05 MED ORDER — FOLIC ACID 1 MG PO TABS: 2.0000 mg | ORAL_TABLET | Freq: Every day | ORAL | Status: DC

## 2015-06-05 MED ORDER — LACTULOSE 10 GM/15ML PO SOLN: 20.0000 g | Freq: Every day | ORAL | Status: AC

## 2015-06-05 MED ORDER — POTASSIUM CHLORIDE ER 20 MEQ PO TBCR: 8.0000 meq | EXTENDED_RELEASE_TABLET | Freq: Every day | ORAL | Status: DC

## 2015-06-05 MED ORDER — CYANOCOBALAMIN 1000 MCG/ML IJ SOLN
1000.0000 ug | Freq: Every day | INTRAMUSCULAR | Status: DC
Start: 2015-06-05 — End: 2015-06-05

## 2015-06-05 MED ORDER — CYANOCOBALAMIN 1000 MCG/ML IJ SOLN
1000.0000 ug | Freq: Every day | INTRAMUSCULAR | Status: DC
  Administered 2015-06-05: 1000 ug via SUBCUTANEOUS
  Filled 2015-06-05: qty 1

## 2015-06-05 MED ORDER — HYDROCODONE-ACETAMINOPHEN 5-325 MG PO TABS: 1.0000 | ORAL_TABLET | Freq: Four times a day (QID) | ORAL | Status: DC | PRN

## 2015-06-05 MED ORDER — ENSURE ENLIVE PO LIQD: 237.0000 mL | Freq: Three times a day (TID) | ORAL | Status: AC

## 2015-06-05 MED ORDER — FOLIC ACID 1 MG PO TABS
2.0000 mg | ORAL_TABLET | Freq: Every day | ORAL | Status: DC
  Administered 2015-06-05: 2 mg via ORAL
  Filled 2015-06-05: qty 2

## 2015-06-05 MED ORDER — CYANOCOBALAMIN 1000 MCG/ML IJ SOLN: INTRAMUSCULAR | Status: AC

## 2015-06-05 MED ORDER — THIAMINE HCL 100 MG PO TABS: 100.0000 mg | ORAL_TABLET | Freq: Every day | ORAL | Status: AC

## 2015-06-05 MED ORDER — FERUMOXYTOL INJECTION 510 MG/17 ML: 510.0000 mg | INTRAVENOUS | Status: DC

## 2015-06-05 NOTE — Discharge Summary (Addendum)
PATIENT DETAILS Name: Bob Wilson Age: 81 y.o. Sex: male Date of Birth: [birth year] MRN: [id number]. Admitting Physician: Nita Sells, MD ION:[id number] Lorelei Pont, MD  Admit Date: 06/01/2015 Discharge date: 06/05/2015  Recommendations for Outpatient Follow-up:  1. Check CBC and chemistries weekly  2. See below regarding management of anemia 3. Ensure follow-up with hematology-Dr. Julien Nordmann 4. Ensure follow-up with orthopedics-Dr. Delfino Lovett  PRIMARY DISCHARGE DIAGNOSIS:  Principal Problem:   Femoral neck fracture, right, closed, initial encounter Active Problems:   GOUT   PROSTATE SPECIFIC ANTIGEN, ELEVATED   Continuous chronic alcoholism ([place])   Medically noncompliant   Nodule of left lung   Persistent atrial fibrillation (HCC)   Hypotonic bladder   Hip fracture (HCC)   Anemia, chronic disease      PAST MEDICAL HISTORY: Past Medical History  Diagnosis Date  . Anemia     NOS  . Arthritis   . Gout   . Hyperlipidemia   . Hypertension   . GERD (gastroesophageal reflux disease)   . Elevated PSA     multiple times, refused urology eval [facility] notes)  . ETOH abuse   . Medically noncompliant     noncompliant with follow ups, labs.  . Alcoholism ([place]) 06/25/2011  . Cirrhosis, alcoholic ([place]) 1/32/4401  . 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 Paroxysmal atrial fibrillation (HCC)   . Bilateral lower extremity edema     Chronic.  Marland Kitchen History of empyema of pleura 12/2011    s/p VATS decortication  . Dysrhythmia     hx of PAF  . Foley catheter in place   . Falls frequently   . 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 Chronic indwelling Foley catheter 04/12/2015    DISCHARGE MEDICATIONS: Current Discharge Medication List    START taking these medications   Details  cyanocobalamin (,VITAMIN B-12,) 1000 MCG/ML injection 1000 mcg Bone Gap daily for 7 days from 06/05/15, then, 1000 mcg Pulcifer weekly for 1 month, then, 1000 mcg  monthly.    Darbepoetin Alfa (ARANESP, ALBUMIN FREE,)  500 MCG/ML SOSY injection Inject 1 mL (500 mcg total) into the skin every 21 ( twenty-one) days.    feeding supplement, ENSURE ENLIVE, (ENSURE ENLIVE) LIQD Take 237 mLs by mouth 3 (three) times daily between meals.    ferumoxytol (FERAHEME) 510 MG/17ML SOLN injection Inject 17 mLs (510 mg total) into the vein once a week.    folic acid (FOLVITE) 1 MG tablet Take 2 tablets (2 mg total) by mouth daily.    HYDROcodone-acetaminophen (NORCO/VICODIN) 5-325 MG tablet Take 1-2 tablets by mouth every 6 (six) hours as needed for moderate pain. Qty: 30 tablet, Refills: 0    lactulose (CHRONULAC) 10 GM/15ML solution Take 30 mLs (20 g total) by mouth daily.    potassium chloride 20 MEQ TBCR Take 8 mEq by mouth daily.    thiamine 100 MG tablet Take 1 tablet (100 mg total) by mouth daily. Qty: 30 tablet, Refills: 0      CONTINUE these medications which have NOT CHANGED   Details  diltiazem (CARDIZEM CD) 240 MG 24 hr capsule Take 1 capsule (240 mg total) by mouth daily. Qty: 30 capsule, Refills: 11    furosemide (LASIX) 40 MG tablet Take 1 tablet (40 mg total) by mouth daily. Qty: 30 tablet, Refills: 11    mirtazapine (REMERON) 15 MG tablet Take 1 tablet (15 mg total) by mouth at bedtime. Qty: 30 tablet, Refills: 5      STOP taking these medications  acetaminophen-codeine (TYLENOL #3) 300-30 MG per tablet      colchicine 0.6 MG tablet         ALLERGIES:   Allergies  Allergen Reactions  . Other     NO BLOOD PRODUCTS(see blood/blood product refusal consent) pt in agreement with albumin   . Ace Inhibitors Other (See Comments)    REACTION: Angioedema  . Aspirin Other (See Comments)    Nose bleeds   . Lisinopril Swelling and Other (See Comments)    REACTION: Facial Swelling  . Rocephin [Ceftriaxone Sodium In Dextrose] Hives    Previously tolerated PCN and Ceph 2/14 Hives with ceftriaxone dose 2/15 No reaction with benadryl/ceftriaxone 2/16 Tolerating Zosyn w/o reaction     BRIEF HPI:  See H&P, Labs, Consult and Test reports for all details in brief, patient is a 81 year old male, Jehovah's Witness, with atrial fibrillation not on anticoagulation, gout, COPD, anemia, prior chronic alcohol use, cirrhosis, gout presented to ED with fall and right hip fracture.   CONSULTATIONS:   hematology(Phone consult with Dr Julien Nordmann by Dr Tana Coast) orthopedic surgery  PERTINENT RADIOLOGIC STUDIES: Dg Chest Portable 1 View  06/01/2015  CLINICAL DATA:  Fall when right hip fracture. Preoperative respiratory exam. EXAM: PORTABLE CHEST 1 VIEW COMPARISON:  12/18/2014 FINDINGS: Stable chronic lung disease and elevation of the right hemidiaphragm. There is no evidence of pulmonary edema, consolidation, pneumothorax, nodule or pleural fluid. The heart size and mediastinal contours are within normal limits. IMPRESSION: Stable chronic lung disease.  No acute findings. Electronically Signed   By: Aletta Edouard M.D.   On: 06/01/2015 09:51   Dg Hip Unilat With Pelvis 2-3 Views Right  06/01/2015  CLINICAL DATA:  Pt c/o severe hip pain x 3hrs today s/p "sat down hard" when walking with his roller-walker this morning and slipped. EXAM: DG HIP (WITH OR WITHOUT PELVIS) 2-3V RIGHT COMPARISON:  04/10/2015 FINDINGS: There is a fracture of the right femoral neck, mid cervical, mildly comminuted and displaced, with the distal fracture component displacing superiorly by approximately 1 cm. There is no significant varus or valgus angulation. There is mild apex anterior angulation. Bones are diffusely demineralized. No other fractures. Hip joints are normally aligned. IMPRESSION: Mildly displaced fracture of the right femoral neck. Electronically Signed   By: Lajean Manes M.D.   On: 06/01/2015 08:36   Dg Femur, Min 2 Views Right  06/01/2015  CLINICAL DATA:  One hip pain for 3 hours today after sitting. EXAM: RIGHT FEMUR 2 VIEWS COMPARISON:  None. FINDINGS: The proximal right femur is captured on today' s  hip radiographs, and appears to show a femoral neck fracture. No femoral shaft or distal femoral fracture is identified. The distal margin of the right lateral femoral condyle is not entirely included on the frontal view and both views are somewhat oblique. Bony demineralization is present. I cannot exclude a small knee effusion. Degenerative spurring in the knee. IMPRESSION: 1. Osteoarthritis in the knee along with bony demineralization. 2. The proximal femur is shown on the hip radiographs and demonstrates a femoral neck fracture. Electronically Signed   By: Van Clines M.D.   On: 06/01/2015 08:38     PERTINENT LAB RESULTS: CBC:  Recent Labs  06/04/15 0547 06/05/15 0525  HGB 7.1* 7.2*  HCT 20.1* 20.1*   CMET CMP     Component Value Date/Time   NA 132* 06/05/2015 0525   K 3.9 06/05/2015 0525   CL 96* 06/05/2015 0525   CO2 28 06/05/2015 0525   GLUCOSE  93 06/05/2015 0525   BUN 15 06/05/2015 0525   CREATININE 1.40* 06/05/2015 0525   CALCIUM 8.0* 06/05/2015 0525   PROT 7.4 12/18/2014 1015   ALBUMIN 3.0* 12/18/2014 1015   AST 63* 12/18/2014 1015   ALT 27 12/18/2014 1015   ALKPHOS 164* 12/18/2014 1015   BILITOT 2.4* 12/18/2014 1015   GFRNONAA 46* 06/05/2015 0525   GFRAA 53* 06/05/2015 0525    GFR Estimated Creatinine Clearance: 44 mL/min (by C-G formula based on Cr of 1.4). No results for input(s): LIPASE, AMYLASE in the last 72 hours. No results for input(s): CKTOTAL, CKMB, CKMBINDEX, TROPONINI in the last 72 hours. Invalid input(s): POCBNP No results for input(s): DDIMER in the last 72 hours. No results for input(s): HGBA1C in the last 72 hours. No results for input(s): CHOL, HDL, LDLCALC, TRIG, CHOLHDL, LDLDIRECT in the last 72 hours. No results for input(s): TSH, T4TOTAL, T3FREE, THYROIDAB in the last 72 hours.  Invalid input(s): FREET3 No results for input(s): VITAMINB12, FOLATE, FERRITIN, TIBC, IRON, RETICCTPCT in the last 72 hours. Coags: No results for  input(s): INR in the last 72 hours.  Invalid input(s): PT Microbiology: Recent Results (from the past 240 hour(s))  MRSA PCR Screening     Status: None   Collection Time: 06/01/15  5:35 PM  Result Value Ref Range Status   MRSA by PCR NEGATIVE NEGATIVE Final    Comment:        The GeneXpert MRSA Assay (FDA approved for NASAL specimens only), is one component of a comprehensive MRSA colonization surveillance program. It is not intended to diagnose MRSA infection nor to guide or monitor treatment for MRSA infections.      BRIEF HOSPITAL COURSE:   Principal Problem: Femoral neck fracture, right, closed, initial encounter: Following a mechanical fall. 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 Recommendations are to pursue definite hip repair once hemoglobin is more than 10. Recommendations from orthopedics are supportive measures, non-weight bearing to the right lower extremity. Once hemoglobin a bit more stable-consider starting aspirin for DVT prophylaxis, in the meantime consider SCDs for DVT prophylaxis.  Active Problems: Acute on chronic anemia: Has anemia of chronic disease at baseline-acute anemia likely multifactorial from fracture of the hip-IV fluid dilution. Suspect chronic anemia from anemia of chronic kidney disease/Cirrhoses-along with iron, vitamin N02 and folic acid deficiency. Dr. Tana Coast spoke with Dr. Julien Nordmann over the phone, who recommended Aranesp 559mg SQ Q3weeks (given 11/14), IV feraheme weekly X2 (given 11/13-needs one additional dose), B12 and folate supplementation and outpatient follow-up.Hb stable for the last several days at around 7.2. Please minimize unnecessary blood draws as much as possible, and have patient follow up with Dr. MJulien Nordmannin 1 week. Note-no overt evidence of any GI bleeding at this time. Please also note-patient  continues to refuse blood products at the time of discharge.Also patient is asymptomatic from this anemia (per outpatient note-confined to wheelchair greater than 90% of the time-but now bed bound-as non weight bearing to the Right lower ext)  Hyponatremia: Chronic issue, suspect will improve after initiation of Lasix.  Chronic Kidney disease stage III: Seems to have developed mild chronic kidney disease over the past few months (creatinine in May 2016 was 1.45). Please continue to monitor creatinine closely while at SNF-Lasix has been restarted. Note creatinine on discharge is 1.40. Please also consider outpatient referral to nephrology.  Persistent atrial fibrillation: Continue Cardizem. Not a candidate for  Coumadin given falls, alcoholism  Chronic indwelling Foley catheter: due to known hx of Hypotonic bladder.Please change Foley catheter every month.  Alcohol abuse: No signs of withdrawal, awake and alert, non-tremulous.  History of alcoholic liver cirrhosis: Clinically compensated-belly appears soft, mild lower extremity edema. Resuming Lasix, will use lactulose for bowel regimen-as will be on narcotics pain management.  Gout: Currently stable.  Known history of left lung nodule: No nodules seen on chest x-ray on admission-defer further workup/surveillance to the outpatient setting/PCP  PROSTATE SPECIFIC ANTIGEN, ELEVATED: Outpatient management by urology  TODAY-DAY OF DISCHARGE:  Subjective:   Harvel Quale today has no headache,no chest abdominal pain,no new weakness tingling or numbness  Objective:   Blood pressure 98/64, pulse 85, temperature 99.4 F (37.4 C), temperature source Oral, resp. rate 18, height '6\' 3"'$  (1.905 m), weight 73.936 kg (163 lb), SpO2 100 %.  Intake/Output Summary (Last 24 hours) at 06/05/15 0946 Last data filed at 06/05/15 0649  Gross per 24 hour  Intake    720 ml  Output    450 ml  Net    270 ml   Filed Weights   06/01/15 1403  Weight: 73.936 kg  (163 lb)    Exam Awake Alert, Oriented *3, No new F.N deficits, Normal affect [place].AT,PERRAL Supple Neck,No JVD, No cervical lymphadenopathy appriciated.  Symmetrical Chest wall movement, Good air movement bilaterally, CTAB RRR,No Gallops,Rubs or new Murmurs, No Parasternal Heave +ve B.Sounds, Abd Soft, Non tender, No organomegaly appriciated, No rebound -guarding or rigidity. No Cyanosis, Clubbing or edema, No new Rash or bruise  DISCHARGE CONDITION: Stable  DISPOSITION: SNF  DISCHARGE INSTRUCTIONS:    Activity:  Non weight bearing to the right lower extremity  Get Medicines reviewed and adjusted: Please take all your medications with you for your next visit with your Primary MD  Please request your Primary MD to go over all hospital tests and procedure/radiological results at the follow up, please ask your Primary MD to get all Hospital records sent to his/her office.  If you experience worsening of your admission symptoms, develop shortness of breath, life threatening emergency, suicidal or homicidal thoughts you must seek medical attention immediately by calling 911 or calling your MD immediately  if symptoms less severe.  You must read complete instructions/literature along with all the possible adverse reactions/side effects for all the Medicines you take and that have been prescribed to you. Take any new Medicines after you have completely understood and accpet all the possible adverse reactions/side effects.   Do not drive when taking Pain medications.   Do not take more than prescribed Pain, Sleep and Anxiety Medications  Special Instructions: If you have smoked or chewed Tobacco  in the last 2 yrs please stop smoking, stop any regular Alcohol  and or any Recreational drug use.  Wear Seat belts while driving.  Please note  You were cared for by a hospitalist during your hospital stay. Once you are discharged, your primary care physician will handle any further medical  issues. Please note that NO REFILLS for any discharge medications will be authorized once you are discharged, as it is imperative that you return to your primary care physician (or establish a relationship with a primary care physician if you do not have one) for your aftercare needs so that they can reassess your need for medications and monitor your lab values.   Diet recommendation: Heart Healthy diet  Discharge Instructions    Call MD for:  difficulty breathing, headache or visual  disturbances    Complete by:  As directed      Call MD for:  persistant nausea and vomiting    Complete by:  As directed      Diet - low sodium heart healthy    Complete by:  As directed      Other Restrictions    Complete by:  As directed   NO WEIGHTBEARING TO RIGHT LOWER EXTREMITY           Follow-up Information    Follow up with Swinte[place], Horald Pollen, MD. Schedule an appointment as soon as possible for a visit in 4 weeks.   Specialty:  Orthopedic Surgery   Contact information:   [place] [address] [phone]       Follow up with Eilleen Kempf., MD. Schedule an appointment as soon as possible for a visit in 1 week.   Specialty:  Oncology   Why:  FOR , Hospital follow up   Contact information:   [place] [address] [phone]       Follow up with Owens Loffler, MD. Schedule an appointment as soon as possible for a visit in 1 week.   Specialty:  Family Medicine   Contact information:   [place] [address] [address] [phone]      Total Time spent on discharge equals  45 minutes.  SignedOren Binet 06/05/2015 9:46 AM

## 2015-06-05 NOTE — Discharge Planning (Signed)
Patient to be discharged to [facility]). Patient's wife updated.  Facility: [facility]) RN report number: [phone] Transportation: [place], [place] [phone] Orthopedics: [phone] Surgical: [phone]

## 2015-06-05 NOTE — Evaluation (Signed)
Physical Therapy Evaluation Patient Details Name: Bob Wilson MRN: [id number] DOB: September 28, 1932 Today's Date: 06/05/2015   History of Present Illness  81 y.o. male admitted to [facility] on 06/01/15 after falling and sustaining a R a R femoral neck fx.  Pt was found to be anemic and unable to undergo surgical intervention at this time due to anemia.  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  Pt with significant PMHx of anemia, gout, arthritis, HTN, ETOH abuse, cirrhosis, PAF, bil LE edema, frequent falls, hypotonic bladder, and chronic indwelling foley catheter.  Clinical Impression  Pt is +3 assistance to get to sitting EOB and slide board transfer to recliner chair.  This transfer took significant effort to try to minimize pt's pain.  He was able to assist with bil upper extremities, but only minimally.  He continues to be appropriate for SNF level rehab.  All further therapy deferred to that venue.   Follow Up Recommendations SNF    Equipment Recommendations  Hospital bed    Recommendations for Other Services   NA    Precautions / Restrictions Precautions Precautions: Fall Precaution Comments: pt with h/o falls Restrictions RLE Weight Bearing: Non weight bearing Other Position/Activity Restrictions: PT paged ortho physician who reports we should not try to mobilie this patient at this time.        Mobility  Bed Mobility Overal bed mobility: Needs Assistance (+3) Bed Mobility: Supine to Sit     Supine to sit: +2 for physical assistance;Max assist;HOB elevated (+3 max)     General bed mobility comments: Three person max assist to get to sitting EOB.  One person assisting to stabilize right leg, two others assisting with trunk and pelvis. Extra time needed to try to avoid pain. Pt assisting with hands on bed rails and HOB elelvated.   Transfers Overall  transfer level: Needs assistance Equipment used:  (slide board) Transfers: Lateral/Scoot Transfers          Lateral/Scoot Transfers: With slide board;From elevated surface;Max assist;Total assist (+3 assist) General transfer comment: Three person assist needed to scoot from bed to recliner chair using slide board.  High to low transfer preformed.  attempted to get back into the bed the same way, except the transfer was low to high and the bed pads had progressed out from under his bottom.  We attempted slide board from recliner chair, then lateral pull, then slide board again before ultimately successful.  Pt attempting to help with hands, but ultimately was more helpful when he just let us move him.   Ambulation/Gait             General Gait Details: unable at this time, only got ok for transfer.         Balance Overall balance assessment: Needs assistance                                           Pertinent Vitals/Pain Pain Assessment: Faces Faces Pain Scale: Hurts whole lot Pain Location: right leg/thigh Pain Descriptors / Indicators: Aching;Burning;Crying;Grimacing;Guarding Pain Intervention(s): Limited activity within patient's tolerance;Monitored during session;Repositioned;Patient requesting pain meds-RN notified;Premedicated before session    Home Living Family/patient expects to be discharged to:: Private residence Living Arrangements: Spouse/significant other Available Help at Discharge: Family;Other (Comment) (  3-4 days per week wife works 9am-3 pm) Type of Home: House Home Access: Ramped entrance     [place]: [address] [place]: Bedside commode;Walker - 4 wheels      Prior Function Level of Independence: Independent with assistive device(s)         Comments: uses rollator "half the time"     Hand Dominance   Dominant Hand: Right    Extremity/Trunk Assessment   Upper Extremity Assessment: Generalized weakness (4/5 per  bed level MMT bil)           Lower Extremity Assessment: RLE deficits/detail;LLE deficits/detail RLE Deficits / Details: right leg only assessed toe motion, ankle DF/PF 2+/5 and sensation (decreased to LT in feet).  LLE Deficits / Details: left leg with generalized weakness, ankle DF/PF 3/5, knee flexion/extension grossly 3-/5, hip flexion 2+/5  Cervical / Trunk Assessment: Normal (only assessed supine in the bed)  Communication   Communication: HOH  Cognition Arousal/Alertness: Awake/alert Behavior During Therapy: WFL for tasks assessed/performed Overall Cognitive Status: Impaired/Different from baseline (family not willing to say in fromt of patient,) Area of Impairment: Orientation;Memory Orientation Level: Disoriented to;Time ("April")   Memory: Decreased short-term memory (reliant on wife to answer history questions)         General Comments: Pt with poor memory.        Exercises Total Joint Exercises Ankle Circles/Pumps: AROM;Both;10 reps;Supine Quad Sets: AROM;Left;5 reps;Supine Heel Slides: AAROM;Left;10 reps;Supine Hip ABduction/ADduction: AROM;Left;10 reps;Supine Straight Leg Raises: AAROM;Left;10 reps;Supine Other Exercises Other Exercises: HEP handouts given to pt and wife.       Assessment/Plan    PT Assessment All further PT needs can be met in the next venue of care  PT Diagnosis Difficulty walking;Abnormality of gait;Generalized weakness;Acute pain;Altered mental status   PT Problem List Decreased strength;Decreased range of motion;Decreased activity tolerance;Decreased balance;Decreased mobility;Decreased cognition;Decreased knowledge of use of DME;Decreased safety awareness;Decreased knowledge of precautions;Pain;Impaired sensation     PT Goals (Current goals can be found in the Care Plan section) Acute Rehab PT Goals Patient Stated Goal: to go home PT Goal Formulation: All assessment and education complete, DC therapy               End of  Session   Activity Tolerance: Patient limited by pain Patient left: in bed;with call bell/phone within reach;with family/visitor present           Time: 5361-4431 PT Time Calculation (min) (ACUTE ONLY): 59 min   Charges:   PT Evaluation $Initial PT Evaluation Tier I: 1 Procedure PT Treatments $Therapeutic Activity: 38-52 mins        Kinga Cassar B. Crimson Dubberly, PT, DPT [phone]   06/05/2015, 4:29 PM

## 2015-06-05 NOTE — Discharge Planning (Signed)
Report called to [place] at [facility].

## 2015-06-05 NOTE — Clinical Social Work Placement (Signed)
   CLINICAL SOCIAL WORK PLACEMENT  NOTE  Date:  06/05/2015  Patient Details  Name: TKAI SERFASS MRN: [id number] Date of Birth: [birth year]  Clinical Social Work is seeking post-discharge placement for this patient at the [place] level of care (*CSW will initial, date and re-position this form in  chart as items are completed):  Yes   Patient/family provided with [place] Work Department's list of facilities offering this level of care within the geographic area requested by the patient (or if unable, by the patient's family).  Yes   Patient/family informed of their freedom to choose among providers that offer the needed level of care, that participate in Medicare, Medicaid or managed care program needed by the patient, have an available bed and are willing to accept the patient.  Yes   Patient/family informed of 's ownership interest in [facility] and [facility], as well as of the fact that they are under no obligation to receive care at these facilities.  PASRR submitted to EDS on       PASRR number received on       Existing PASRR number confirmed on 06/04/15     FL2 transmitted to all facilities in geographic area requested by pt/family on 06/04/15     FL2 transmitted to all facilities within larger geographic area on       Patient informed that his/her managed care company has contracts with or will negotiate with certain facilities, including the following:        Yes   Patient/family informed of bed offers received.  Patient chooses bed at [facility]     Physician recommends and patient chooses bed at      Patient to be transferred to [facility] on 06/05/15.  Patient to be transferred to facility by [facility]     Patient family notified on 06/05/15 of transfer.  Name of family member notified:  Patient's wife, Deneise Lever.     PHYSICIAN       Additional Comment:     _______________________________________________ Caroline Sauger, LCSW 06/05/2015, 2:32 PM

## 2015-06-07 ENCOUNTER — Non-Acute Institutional Stay (SKILLED_NURSING_FACILITY): Payer: Medicare Other | Admitting: Adult Health

## 2015-06-07 DIAGNOSIS — S72001A Fracture of unspecified part of neck of right femur, initial encounter for closed fracture: Secondary | ICD-10-CM | POA: Diagnosis not present

## 2015-06-07 DIAGNOSIS — K7030 Alcoholic cirrhosis of liver without ascites: Secondary | ICD-10-CM

## 2015-06-07 DIAGNOSIS — D638 Anemia in other chronic diseases classified elsewhere: Secondary | ICD-10-CM | POA: Diagnosis not present

## 2015-06-07 DIAGNOSIS — I4819 Other persistent atrial fibrillation: Secondary | ICD-10-CM

## 2015-06-07 DIAGNOSIS — I481 Persistent atrial fibrillation: Secondary | ICD-10-CM

## 2015-06-07 DIAGNOSIS — N312 Flaccid neuropathic bladder, not elsewhere classified: Secondary | ICD-10-CM

## 2015-06-07 DIAGNOSIS — I10 Essential (primary) hypertension: Secondary | ICD-10-CM | POA: Diagnosis not present

## 2015-06-10 ENCOUNTER — Encounter: Payer: Self-pay | Admitting: Adult Health

## 2015-06-10 ENCOUNTER — Non-Acute Institutional Stay (SKILLED_NURSING_FACILITY): Payer: Medicare Other | Admitting: Adult Health

## 2015-06-10 DIAGNOSIS — R630 Anorexia: Secondary | ICD-10-CM | POA: Insufficient documentation

## 2015-06-10 DIAGNOSIS — M109 Gout, unspecified: Secondary | ICD-10-CM | POA: Diagnosis not present

## 2015-06-10 DIAGNOSIS — S72001A Fracture of unspecified part of neck of right femur, initial encounter for closed fracture: Secondary | ICD-10-CM | POA: Diagnosis not present

## 2015-06-10 MED ORDER — DRONABINOL 2.5 MG PO CAPS: 2.5000 mg | ORAL_CAPSULE | Freq: Every day | ORAL | Status: AC

## 2015-06-10 MED ORDER — METAXALONE 800 MG PO TABS: 800.0000 mg | ORAL_TABLET | Freq: Three times a day (TID) | ORAL | Status: AC

## 2015-06-10 NOTE — Progress Notes (Signed)
Patient ID: Bob Wilson, male   DOB: 1933-03-22, 81 y.o.   MRN: [id number]    Facility: [facility]      Allergies  Allergen Reactions  . Other     NO BLOOD PRODUCTS(see blood/blood product refusal consent) pt in agreement with albumin   . Ace Inhibitors Other (See Comments)    REACTION: Angioedema  . Aspirin Other (See Comments)    Nose bleeds   . Lisinopril Swelling and Other (See Comments)    REACTION: Facial Swelling  . Rocephin [Ceftriaxone Sodium In Dextrose] Hives    Previously tolerated PCN and Ceph 2/14 Hives with ceftriaxone dose 2/15 No reaction with benadryl/ceftriaxone 2/16 Tolerating Zosyn w/o reaction    Chief Complaint  Patient presents with  . Hospitalization Follow-up    HPI:  He has been hospitalized status post right femur fracture. He has chronic anemia; will not receive blood products due to religious beliefs. He has a history of afib; is not a history for anticoagulation due to falls and alcoholism. He is here for short term rehab with his goal to return back home. He will need to build up his hgb before a surgical repair can be done. He cannot fully participate in the hpi or ros; but is complaining of pain "all over".    Past Medical History  Diagnosis Date  . Anemia     NOS  . Arthritis   . Gout   . Hyperlipidemia   . Hypertension   . GERD (gastroesophageal reflux disease)   . Elevated PSA     multiple times, refused urology eval [facility] notes)  . ETOH abuse   . Medically noncompliant     noncompliant with follow ups, labs.  . Alcoholism ([place]) 06/25/2011  . Cirrhosis, alcoholic ([place]) 3/78/5885  . 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 Paroxysmal atrial fibrillation (HCC)   . Bilateral lower extremity edema     Chronic.  Marland Kitchen History of empyema of pleura 12/2011    s/p VATS decortication  . Dysrhythmia     hx of PAF  . Foley catheter in place   . Falls frequently   . 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 Chronic indwelling Foley catheter  04/12/2015    Past Surgical History  Procedure Laterality Date  . Lipoma excision    . Transthoracic echocardiogram  09/03/2014    Normal overall LV size. EF 55-60% with no regional W. MA. Trivial AI. Mild-moderate MR. Mild R. and LA dilation. Mild to moderate TR. PA pressures ~47 mmHg  . Video assisted thoracoscopy (vats)/empyema  12/2011  . Nm myoview ltd  10/12/14    LOW RISK - NO ISCHEMIA, DIAPHRAGMATIC ATTENUATION, NON-GATED  . Green light laser turp (transurethral resection of prostate N/A 12/21/2014    Procedure: GREEN LIGHT LASER TURP (TRANSURETHRAL RESECTION OF PROSTATE;  Surgeon: Ardis Hughs, MD;  Location: [facility];  Service: Urology;  Laterality: N/A;    VITAL SIGNS BP 119/78 mmHg  Pulse 88  Ht '6\' 4"'$  (1.93 m)  Wt 163 lb (73.936 kg)  BMI 19.85 kg/m2  Patient's Medications  New Prescriptions   No medications on file  Previous Medications   CYANOCOBALAMIN (,VITAMIN B-12,) 1000 MCG/ML INJECTION    1000 mcg [place] daily for 7 days from 06/05/15, then, 1000 mcg [place] weekly for 1 month, then, 1000 mcg [place] monthly.   DARBEPOETIN ALFA (ARANESP, ALBUMIN FREE,) 500 MCG/ML SOSY INJECTION    Inject 1 mL (500 mcg total) into the skin every 21 (  twenty-one) days.   DILTIAZEM (CARDIZEM CD) 240 MG 24 HR CAPSULE    Take 1 capsule (240 mg total) by mouth daily.   FEEDING SUPPLEMENT, ENSURE ENLIVE, (ENSURE ENLIVE) LIQD    Take 237 mLs by mouth 3 (three) times daily between meals.   ferumoxytol (feraheme) 510 mg IV weekly    FOLIC ACID (FOLVITE) 1 MG TABLET    Take 2 tablets (2 mg total) by mouth daily.   FUROSEMIDE (LASIX) 40 MG TABLET    Take 1 tablet (40 mg total) by mouth daily.   HYDROCODONE-ACETAMINOPHEN (NORCO/VICODIN) 5-325 MG TABLET    Take 1-2 tablets by mouth every four hours as needed for moderate pain.   LACTULOSE (CHRONULAC) 10 GM/15ML SOLUTION    Take 30 mLs (20 g total) by mouth daily.   MIRTAZAPINE (REMERON) 15 MG TABLET    Take 1 tablet (15 mg total) by mouth at bedtime.    POTASSIUM CHLORIDE 20 MEQ TBCR    Take 8 mEq by mouth daily.   THIAMINE 100 MG TABLET    Take 1 tablet (100 mg total) by mouth daily.  Modified Medications   No medications on file  Discontinued Medications   No medications on file     SIGNIFICANT DIAGNOSTIC EXAMS  06-01-15: right femur x-ray: . Osteoarthritis in the knee along with bony demineralization. 2. The proximal femur is shown on the hip radiographs and demonstrates a femoral neck fracture.  06-01-15: bilateral hip and pelvis x-ray: Mildly displaced fracture of the right femoral neck.   06-01-15: chest x-ray: Stable chronic lung disease.  No acute findings.   LABS REVIEWED:   06-01-15: wbc 8.9; hgb 8.6; hct 24.5; mcv 79.8; plt 234; glucose 78; bun 12; creat 1.49; k+ 2.9; na++126; iron 164; tibc 190; ferritin 373; vit b12; 218; folate 4.8 06-02-15: wbc 6.9; hgb 7.4; hct 20.7; mcv 81.5; plt 176; glucose 81; bun 11; creat 1.31; k+3.5; na++130; mag 1.3 06-03-15: hgb 7.2; hct 20.4; glucose 115; bun 10; creat 1.12; k+ 3.0; na++130; mag 2.6 06-05-15: hgb 7.2; hct 20.1; glucose 93; bun 15; creat 1.40; k+ 3.9; na++132;     Review of Systems  Unable to perform ROS: mental acuity   Physical Exam  Constitutional: No distress.  Thin   Eyes: Conjunctivae are normal.  Neck: Neck supple. No JVD present. No thyromegaly present.  Cardiovascular: Normal rate, regular rhythm and intact distal pulses.   Respiratory: Effort normal and breath sounds normal. No respiratory distress. He has no wheezes.  GI: Soft. Bowel sounds are normal. He exhibits no distension. There is no tenderness.  Genitourinary:  Has long term foley   Musculoskeletal: He exhibits no edema.  Is status post right femur fracture Does not voluntarily move lower extremities   Lymphadenopathy:    He has no cervical adenopathy.  Neurological: He is alert.  Skin: Skin is warm and dry. He is not diaphoretic.  Psychiatric: He has a normal mood and affect.         ASSESSMENT/ PLAN:  1. Right femoral neck fracture: will follow up with orthopedics as indicated and will continue therapy as directed. He is continuing to have pain present; will change vicodin to 10/325 mg every 4 hours routinely and will monitor his status.   2. Anemia: will go to short stay surgery for his iron injection and aranesp injections. Will continue vitamin B12 injections as directed. Will monitor   3. Afib: he is not a candidate for anticoagulation; will continue cardizem cd 240  mg daily for rate control will monitor   4. Hypokalemia: will continue k+ 8 meq daily   5. Alcohol cirrhosis; will continue folic acid 2 mg daily thiamine 100 mg daily  6. Constipation: will continue lactulose 30 cc daily   7. Lower extremity edema: will continue lasix 40 mg daily   8. Neurogenic bladder: has long term foley is followed by urology  9. Depression: will continue remeron 15 mg nightly    Time spent with patient  50  minutes >50% time spent counseling; reviewing medical record; tests; labs; and developing future plan of care   Ok Edwards NP [facility]  Contact [phone] Monday through Friday 8am- 5pm  After hours call [phone]

## 2015-06-10 NOTE — Progress Notes (Signed)
Patient ID: Bob Wilson, male   DOB: 1933-02-23, 81 y.o.   MRN: [id number]   Facility: [facility]      Allergies  Allergen Reactions  . Other     NO BLOOD PRODUCTS(see blood/blood product refusal consent) pt in agreement with albumin   . Ace Inhibitors Other (See Comments)    REACTION: Angioedema  . Aspirin Other (See Comments)    Nose bleeds   . Lisinopril Swelling and Other (See Comments)    REACTION: Facial Swelling  . Rocephin [Ceftriaxone Sodium In Dextrose] Hives    Previously tolerated PCN and Ceph 2/14 Hives with ceftriaxone dose 2/15 No reaction with benadryl/ceftriaxone 2/16 Tolerating Zosyn w/o reaction    Chief Complaint  Patient presents with  . Acute Visit    patient concerns     HPI:  Staff reports that his appetite is very poor and his fluid intake requires a great amount of encouragement. He cannot fully participate in the hpi or ros; but is complaining pain but is unable to tell where he hurts. He is unable to participate fully in therapy due to his pain. His elbows are red; warm and tender he has a history of gout.   Past Medical History  Diagnosis Date  . Anemia     NOS  . Arthritis   . Gout   . Hyperlipidemia   . Hypertension   . GERD (gastroesophageal reflux disease)   . Elevated PSA     multiple times, refused urology eval [facility] notes)  . ETOH abuse   . Medically noncompliant     noncompliant with follow ups, labs.  . Alcoholism ([place]) 06/25/2011  . Cirrhosis, alcoholic ([place]) 6/75/9163  . 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 Paroxysmal atrial fibrillation (HCC)   . Bilateral lower extremity edema     Chronic.  Marland Kitchen History of empyema of pleura 12/2011    s/p VATS decortication  . Dysrhythmia     hx of PAF  . Foley catheter in place   . Falls frequently   . 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 Chronic indwelling Foley catheter 04/12/2015    Past Surgical History  Procedure Laterality Date  . Lipoma excision    . Transthoracic  echocardiogram  09/03/2014    Normal overall LV size. EF 55-60% with no regional W. MA. Trivial AI. Mild-moderate MR. Mild R. and LA dilation. Mild to moderate TR. PA pressures ~47 mmHg  . Video assisted thoracoscopy (vats)/empyema  12/2011  . Nm myoview ltd  10/12/14    LOW RISK - NO ISCHEMIA, DIAPHRAGMATIC ATTENUATION, NON-GATED  . Green light laser turp (transurethral resection of prostate N/A 12/21/2014    Procedure: GREEN LIGHT LASER TURP (TRANSURETHRAL RESECTION OF PROSTATE;  Surgeon: Ardis Hughs, MD;  Location: [facility];  Service: Urology;  Laterality: N/A;    VITAL SIGNS BP 105/55 mmHg  Pulse 80  Ht '6\' 4"'$  (1.93 m)  Wt 163 lb (73.936 kg)  BMI 19.85 kg/m2  Patient's Medications  New Prescriptions   No medications on file  Previous Medications   CYANOCOBALAMIN (,VITAMIN B-12,) 1000 MCG/ML INJECTION    1000 mcg [place] daily for 7 days from 06/05/15, then, 1000 mcg [place] weekly for 1 month, then, 1000 mcg [place] monthly.   DARBEPOETIN ALFA (ARANESP, ALBUMIN FREE,) 500 MCG/ML SOSY INJECTION    Inject 1 mL (500 mcg total) into the skin every 21 ( twenty-one) days.   DILTIAZEM (CARDIZEM CD) 240 MG 24 HR CAPSULE  Take 1 capsule (240 mg total) by mouth daily.   FEEDING SUPPLEMENT, ENSURE ENLIVE, (ENSURE ENLIVE) LIQD    Take 237 mLs by mouth 3 (three) times daily between meals.   FERUMOXYTOL (FERAHEME) 510 MG/17ML SOLN INJECTION    Inject 17 mLs (510 mg total) into the vein once a week.   FOLIC ACID (FOLVITE) 1 MG TABLET    Take 2 tablets (2 mg total) by mouth daily.   FUROSEMIDE (LASIX) 40 MG TABLET    Take 1 tablet (40 mg total) by mouth daily.   HYDROCODONE-ACETAMINOPHEN (NORCO) 10-325 MG TABLET    Take 1 tablet by mouth every 4 (four) hours.   LACTULOSE (CHRONULAC) 10 GM/15ML SOLUTION    Take 30 mLs (20 g total) by mouth daily.   MIRTAZAPINE (REMERON) 15 MG TABLET    Take 1 tablet (15 mg total) by mouth at bedtime.   POTASSIUM CHLORIDE 20 MEQ TBCR    Take 8 mEq by mouth daily.   THIAMINE  100 MG TABLET    Take 1 tablet (100 mg total) by mouth daily.  Modified Medications   No medications on file  Discontinued Medications   HYDROCODONE-ACETAMINOPHEN (NORCO/VICODIN) 5-325 MG TABLET    Take 1-2 tablets by mouth every 6 (six) hours as needed for moderate pain.     SIGNIFICANT DIAGNOSTIC EXAMS    06-01-15: right femur x-ray: . Osteoarthritis in the knee along with bony demineralization. 2. The proximal femur is shown on the hip radiographs and demonstrates a femoral neck fracture.  06-01-15: bilateral hip and pelvis x-ray: Mildly displaced fracture of the right femoral neck.   06-01-15: chest x-ray: Stable chronic lung disease.  No acute findings.   LABS REVIEWED:   06-01-15: wbc 8.9; hgb 8.6; hct 24.5; mcv 79.8; plt 234; glucose 78; bun 12; creat 1.49; k+ 2.9; na++126; iron 164; tibc 190; ferritin 373; vit b12; 218; folate 4.8 06-02-15: wbc 6.9; hgb 7.4; hct 20.7; mcv 81.5; plt 176; glucose 81; bun 11; creat 1.31; k+3.5; na++130; mag 1.3 06-03-15: hgb 7.2; hct 20.4; glucose 115; bun 10; creat 1.12; k+ 3.0; na++130; mag 2.6 06-05-15: hgb 7.2; hct 20.1; glucose 93; bun 15; creat 1.40; k+ 3.9; na++132;  06-06-15: wbc 16.5; hgb 7.2; hct 21.9; mcv 89.0; plt 290; glucose 66; bun 18; creat 1.31; k+ 5.0; na++135     Review of Systems  Unable to perform ROS: mental acuity   Physical Exam  Constitutional: No distress.  Thin   Eyes: Conjunctivae are normal.  Neck: Neck supple. No JVD present. No thyromegaly present.  Cardiovascular: Normal rate, regular rhythm and intact distal pulses.   Respiratory: Effort normal and breath sounds normal. No respiratory distress. He has no wheezes.  GI: Soft. Bowel sounds are normal. He exhibits no distension. There is no tenderness.  Genitourinary:  Has long term foley   Musculoskeletal: He exhibits no edema.  Is status post right femur fracture Does not voluntarily move lower extremities Bilateral elbows are red; warm and tender to  touch   Lymphadenopathy:    He has no cervical adenopathy.  Neurological: He is alert.  Skin: Skin is warm and dry. He is not diaphoretic.  Psychiatric: He has a normal mood and affect.      ASSESSMENT/ PLAN:  1. Right femoral neck fracture: will follow up with orthopedics as indicated and will continue therapy as directed. He is continuing to have pain present; will continue  vicodin  10/325 mg every 4 hours routinely will begin skelaxin  800 mg three times daily will  Monitor  2. Anorexia: will begin marinol 2.5 mg daily before lunch for 30 days will monitor  3. Gout: will begin colchicine 0.6 mg daily for one week then daily as needed      Time spent with patient  35  minutes >50% time spent counseling; reviewing medical record; tests; labs; and developing future plan of care   Ok Edwards NP [facility]  Contact [phone] Monday through Friday 8am- 5pm  After hours call [phone]

## 2015-06-11 ENCOUNTER — Encounter: Payer: Self-pay | Admitting: Internal Medicine

## 2015-06-11 ENCOUNTER — Non-Acute Institutional Stay (SKILLED_NURSING_FACILITY): Payer: Medicare Other | Admitting: Internal Medicine

## 2015-06-11 DIAGNOSIS — I10 Essential (primary) hypertension: Secondary | ICD-10-CM

## 2015-06-11 DIAGNOSIS — R52 Pain, unspecified: Secondary | ICD-10-CM | POA: Diagnosis not present

## 2015-06-11 DIAGNOSIS — I4819 Other persistent atrial fibrillation: Secondary | ICD-10-CM

## 2015-06-11 DIAGNOSIS — S72001A Fracture of unspecified part of neck of right femur, initial encounter for closed fracture: Secondary | ICD-10-CM | POA: Diagnosis not present

## 2015-06-11 DIAGNOSIS — R630 Anorexia: Secondary | ICD-10-CM

## 2015-06-11 DIAGNOSIS — I481 Persistent atrial fibrillation: Secondary | ICD-10-CM | POA: Diagnosis not present

## 2015-06-11 DIAGNOSIS — K7030 Alcoholic cirrhosis of liver without ascites: Secondary | ICD-10-CM

## 2015-06-11 DIAGNOSIS — M109 Gout, unspecified: Secondary | ICD-10-CM

## 2015-06-11 NOTE — Progress Notes (Signed)
Patient ID: ISIDORE MARGRAF, male   DOB: Dec 31, 1932, 81 y.o.   MRN: [id number]    HISTORY AND PHYSICAL   DATE: 06/11/15   Location:  [place] of Service: SNF [phone])   Extended Emergency Contact Information Primary Emergency Contact: Brace, Welte Address: [address]          [place], [place] [address] [name] of [place] Phone: [phone] Relation: Spouse  Advanced Directive information   FULL CODE  Chief Complaint  Patient presents with  . New Admit To SNF    HPI:  81 yo male seen today as a new admission into SNF following hospital stay for right femur fx, anemia and Etoholic cirrhosis. He is a Jehovah Witness. Hgb <8 and Ortho refused to operate until Hgb>8. He presents to SNF for rehab, potential long term care.  He c/o severe pain today with reduced appetite and decreased fluid intake. No nursing issues. No falls. He is a poor historian due to mental status. Hx obtained from chart  Anemia - Hgb 7.2. He is a Jehovah Witness and has refused blood transfusion. He gets IV iron and aranesp injections, B12 injections. Followed by hematology  neurogenic bladder - has foley cath  HTN/afib - BP stable and HR controlled on diltiazem and lasix with potassium  Hyperlipidemia - diet controlled  Etoh cirrhosis - takes laculose, thiamine and folate  Loss of appetite - on remeron and marinol. Takes nutritional supplements  Hx gout/arthritis - no recent attack.   Past Medical History  Diagnosis Date  . Anemia     NOS  . Arthritis   . Gout   . Hyperlipidemia   . Hypertension   . GERD (gastroesophageal reflux disease)   . Elevated PSA     multiple times, refused urology eval [facility] notes)  . ETOH abuse   . Medically noncompliant     noncompliant with follow ups, labs.  . Alcoholism ([place]) 06/25/2011  . Cirrhosis, alcoholic ([place]) 9/93/5701  . 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 Paroxysmal atrial fibrillation ([facility])   .  Bilateral lower extremity edema     Chronic.  Marland Kitchen History of empyema of pleura 12/2011    s/p VATS decortication  . Dysrhythmia     hx of PAF  . Foley catheter in place   . Falls frequently   . 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 Chronic indwelling Foley catheter 04/12/2015    Past Surgical History  Procedure Laterality Date  . Lipoma excision    . Transthoracic echocardiogram  09/03/2014    Normal overall LV size. EF 55-60% with no regional W. MA. Trivial AI. Mild-moderate MR. Mild R. and LA dilation. Mild to moderate TR. PA pressures ~47 mmHg  . Video assisted thoracoscopy (vats)/empyema  12/2011  . Nm myoview ltd  10/12/14    LOW RISK - NO ISCHEMIA, DIAPHRAGMATIC ATTENUATION, NON-GATED  . Green light laser turp (transurethral resection of prostate N/A 12/21/2014    Procedure: GREEN LIGHT LASER TURP (TRANSURETHRAL RESECTION OF PROSTATE;  Surgeon: Ardis Hughs, MD;  Location: [facility];  Service: Urology;  Laterality: N/A;    Patient Care Team: Owens Loffler, MD as PCP - General Carlyle Basques, MD as Consulting Physician (Infectious Diseases) Deneise Lever, MD as Consulting Physician (Pulmonary Disease)  Social History   Social History  . Marital Status: Married    Spouse Name: N/A  . Number of Children: 2  . Years of Education: N/A  Occupational History  . former Engineer, building services     retired   Social History Main Topics  . Smoking status: Former Smoker -- 0.25 packs/day for 29 years    Types: Cigarettes    Quit date: 07/20/1974  . Smokeless tobacco: Never Used     Comment: Remote- quit 45 years ago.  . Alcohol Use: 0.0 oz/week    0 Standard drinks or equivalent per week     Comment: states that he drinks "couple pints of gin" every day.  nnow has only had maybe one shot of June in the last few weeks.  . Drug Use: No  . Sexual Activity: Not Currently   Other Topics Concern  . Not on file   Social History Narrative   Chronic Alcoholic, severe   Jehovah's Witness          He does not exercise much because of unsteady gait.    He does have a walker and use range of motion at least 2 days a week with physical therapy.      Now usually using a motorized WC      Used to be a Dealer     reports that he quit smoking about 40 years ago. His smoking use included Cigarettes. He has a 7.25 pack-year smoking history. He has never used smokeless tobacco. He reports that he drinks alcohol. He reports that he does not use illicit drugs.  Family History  Problem Relation Age of Onset  . Diabetes Mellitus II Father   . Stroke Mother    Family Status  Relation Status Death Age  . Mother Deceased   . Father Deceased   . Sister Alive   . Brother Alive   . Brother Alive   . Brother Alive   . Brother Alive   . Sister Deceased   . Sister Deceased   . Sister Alive   . Son Alive   . Son Alive     There is no immunization history for the selected administration types on file for this patient.  Allergies  Allergen Reactions  . Other     NO BLOOD PRODUCTS(see blood/blood product refusal consent) pt in agreement with albumin   . Ace Inhibitors Other (See Comments)    REACTION: Angioedema  . Aspirin Other (See Comments)    Nose bleeds   . Lisinopril Swelling and Other (See Comments)    REACTION: Facial Swelling  . Rocephin [Ceftriaxone Sodium In Dextrose] Hives    Previously tolerated PCN and Ceph 2/14 Hives with ceftriaxone dose 2/15 No reaction with benadryl/ceftriaxone 2/16 Tolerating Zosyn w/o reaction    Medications: Patient's Medications  New Prescriptions   No medications on file  Previous Medications   CYANOCOBALAMIN (,VITAMIN B-12,) 1000 MCG/ML INJECTION    1000 mcg [place] daily for 7 days from 06/05/15, then, 1000 mcg [place] weekly for 1 month, then, 1000 mcg [place] monthly.   DARBEPOETIN ALFA (ARANESP, ALBUMIN FREE,) 500 MCG/ML SOSY INJECTION    Inject 1 mL (500 mcg total) into the skin every 21 ( twenty-one) days.   DILTIAZEM (CARDIZEM CD) 240  MG 24 HR CAPSULE    Take 1 capsule (240 mg total) by mouth daily.   DRONABINOL (MARINOL) 2.5 MG CAPSULE    Take 1 capsule (2.5 mg total) by mouth daily before lunch.   FEEDING SUPPLEMENT, ENSURE ENLIVE, (ENSURE ENLIVE) LIQD    Take 237 mLs by mouth 3 (three) times daily between meals.   FERUMOXYTOL (FERAHEME) 510 MG/17ML SOLN  INJECTION    Inject 17 mLs (510 mg total) into the vein once a week.   FOLIC ACID (FOLVITE) 1 MG TABLET    Take 2 tablets (2 mg total) by mouth daily.   FUROSEMIDE (LASIX) 40 MG TABLET    Take 1 tablet (40 mg total) by mouth daily.   HYDROCODONE-ACETAMINOPHEN (NORCO) 10-325 MG TABLET    Take 1 tablet by mouth every 4 (four) hours.   LACTULOSE (CHRONULAC) 10 GM/15ML SOLUTION    Take 30 mLs (20 g total) by mouth daily.   METAXALONE (SKELAXIN) 800 MG TABLET    Take 1 tablet (800 mg total) by mouth 3 (three) times daily.   MIRTAZAPINE (REMERON) 15 MG TABLET    Take 1 tablet (15 mg total) by mouth at bedtime.   POTASSIUM CHLORIDE 20 MEQ TBCR    Take 8 mEq by mouth daily.   THIAMINE 100 MG TABLET    Take 1 tablet (100 mg total) by mouth daily.  Modified Medications   No medications on file  Discontinued Medications   No medications on file    Review of Systems  Unable to perform ROS: Other  encephalopathy  Filed Vitals:   06/11/15 1443  BP: 118/68  Pulse: 88  Temp: 97.7 F (36.5 C)  Weight: 163 lb (73.936 kg)   Body mass index is 19.85 kg/(m^2).  Physical Exam  Constitutional:  Sitting up in bed in NAD, frail appearing, looks pale, grimacing and shallow breathing; temporal wasting  HENT:  Mouth/Throat: Oropharynx is clear and moist.  MM dry  Eyes: Pupils are equal, round, and reactive to light. No scleral icterus.  Neck: Neck supple. Carotid bruit is not present. No thyromegaly present.  Cardiovascular: Normal rate, regular rhythm, normal heart sounds and intact distal pulses.  Exam reveals no gallop and no friction rub.   No murmur heard. No LE edema b/l.  Calf TTP b/l  Pulmonary/Chest: Effort normal and breath sounds normal. He has no wheezes. He has no rales. He exhibits no tenderness.  Abdominal: Soft. Bowel sounds are normal. He exhibits no distension, no abdominal bruit, no pulsatile midline mass and no mass. There is no tenderness. There is no rebound and no guarding.  Genitourinary:  Foley intact and DTG  Musculoskeletal: He exhibits edema (b/l knee) and tenderness.  Right hip externally rotated  Lymphadenopathy:    He has no cervical adenopathy.  Neurological: He is alert.  Skin: Skin is warm and dry. No rash noted.  (+) tenting  Psychiatric: He has a normal mood and affect. His behavior is normal.     Labs reviewed: Admission on 06/01/2015, Discharged on 06/05/2015  Component Date Value Ref Range Status  . WBC 06/01/2015 8.9  4.0 - 10.5 K/uL Final  . RBC 06/01/2015 3.07* 4.22 - 5.81 MIL/uL Final  . Hemoglobin 06/01/2015 8.6* 13.0 - 17.0 g/dL Final  . HCT 06/01/2015 24.5* 39.0 - 52.0 % Final  . MCV 06/01/2015 79.8  78.0 - 100.0 fL Final  . MCH 06/01/2015 28.0  26.0 - 34.0 pg Final  . MCHC 06/01/2015 35.1  30.0 - 36.0 g/dL Final  . RDW 06/01/2015 18.0* 11.5 - 15.5 % Final  . Platelets 06/01/2015 234  150 - 400 K/uL Final  . Neutrophils Relative % 06/01/2015 68   Final  . Neutro Abs 06/01/2015 6.1  1.7 - 7.7 K/uL Final  . Lymphocytes Relative 06/01/2015 15   Final  . Lymphs Abs 06/01/2015 1.3  0.7 - 4.0 K/uL Final  .  Monocytes Relative 06/01/2015 16   Final  . Monocytes Absolute 06/01/2015 1.4* 0.1 - 1.0 K/uL Final  . Eosinophils Relative 06/01/2015 1   Final  . Eosinophils Absolute 06/01/2015 0.1  0.0 - 0.7 K/uL Final  . Basophils Relative 06/01/2015 0   Final  . Basophils Absolute 06/01/2015 0.0  0.0 - 0.1 K/uL Final  . Sodium 06/01/2015 126* 135 - 145 mmol/L Final  . Potassium 06/01/2015 2.9* 3.5 - 5.1 mmol/L Final  . Chloride 06/01/2015 89* 101 - 111 mmol/L Final  . CO2 06/01/2015 25  22 - 32 mmol/L Final  . Glucose,  Bld 06/01/2015 78  65 - 99 mg/dL Final  . BUN 06/01/2015 12  6 - 20 mg/dL Final  . Creatinine, Ser 06/01/2015 1.49* 0.61 - 1.24 mg/dL Final  . Calcium 06/01/2015 7.9* 8.9 - 10.3 mg/dL Final  . GFR calc non Af Amer 06/01/2015 43* >60 mL/min Final  . GFR calc Af Amer 06/01/2015 49* >60 mL/min Final   Comment: (NOTE) The eGFR has been calculated using the CKD EPI equation. This calculation has not been validated in all clinical situations. eGFR's persistently <60 mL/min signify possible Chronic Kidney Disease.   . Anion gap 06/01/2015 12  5 - 15 Final  . Prothrombin Time 06/01/2015 13.5  11.6 - 15.2 seconds Final  . INR 06/01/2015 1.01  0.00 - 1.49 Final  . Sodium, Ur 06/01/2015 <10   Final  . Osmolality, Ur 06/01/2015 207* 390 - 1090 mOsm/kg Final   Performed at [facility]  . Vitamin B-12 06/01/2015 218  180 - 914 pg/mL Final   Comment: (NOTE) This assay is not validated for testing neonatal or myeloproliferative syndrome specimens for Vitamin B12 levels. Performed at [facility]   . Folate 06/01/2015 4.8* >5.9 ng/mL Final   Performed at [facility]  . Iron 06/01/2015 164  45 - 182 ug/dL Final  . TIBC 06/01/2015 190* 250 - 450 ug/dL Final  . Saturation Ratios 06/01/2015 86* 17.9 - 39.5 % Final  . UIBC 06/01/2015 26   Final   Performed at [facility]  . Ferritin 06/01/2015 373* 24 - 336 ng/mL Final   Performed at [place] 06/01/2015 1.3  0.4 - 3.1 % Final  . RBC. 06/01/2015 3.20* 4.22 - 5.81 MIL/uL Final  . Retic Count, Manual 06/01/2015 41.6  19.0 - 186.0 K/uL Final  . WBC 06/02/2015 6.9  4.0 - 10.5 K/uL Final  . RBC 06/02/2015 2.54* 4.22 - 5.81 MIL/uL Final  . Hemoglobin 06/02/2015 7.4* 13.0 - 17.0 g/dL Final  . HCT 06/02/2015 20.7* 39.0 - 52.0 % Final  . MCV 06/02/2015 81.5  78.0 - 100.0 fL Final  . MCH 06/02/2015 29.1  26.0 - 34.0 pg Final  . MCHC 06/02/2015 35.7  30.0 - 36.0 g/dL Final  . RDW 06/02/2015 17.9*  11.5 - 15.5 % Final  . Platelets 06/02/2015 176  150 - 400 K/uL Final  . Sodium 06/02/2015 130* 135 - 145 mmol/L Final  . Potassium 06/02/2015 3.5  3.5 - 5.1 mmol/L Final   DELTA CHECK NOTED  . Chloride 06/02/2015 96* 101 - 111 mmol/L Final  . CO2 06/02/2015 27  22 - 32 mmol/L Final  . Glucose, Bld 06/02/2015 81  65 - 99 mg/dL Final  . BUN 06/02/2015 11  6 - 20 mg/dL Final  . Creatinine, Ser 06/02/2015 1.31* 0.61 - 1.24 mg/dL Final  . Calcium 06/02/2015 7.6* 8.9 -  10.3 mg/dL Final  . GFR calc non Af Amer 06/02/2015 50* >60 mL/min Final  . GFR calc Af Amer 06/02/2015 58* >60 mL/min Final   Comment: (NOTE) The eGFR has been calculated using the CKD EPI equation. This calculation has not been validated in all clinical situations. eGFR's persistently <60 mL/min signify possible Chronic Kidney Disease.   . Anion gap 06/02/2015 7  5 - 15 Final  . Sodium 06/01/2015 129* 135 - 145 mmol/L Final  . Potassium 06/01/2015 2.9* 3.5 - 5.1 mmol/L Final  . Chloride 06/01/2015 92* 101 - 111 mmol/L Final  . CO2 06/01/2015 29  22 - 32 mmol/L Final  . Glucose, Bld 06/01/2015 95  65 - 99 mg/dL Final  . BUN 06/01/2015 10  6 - 20 mg/dL Final  . Creatinine, Ser 06/01/2015 1.40* 0.61 - 1.24 mg/dL Final  . Calcium 06/01/2015 7.7* 8.9 - 10.3 mg/dL Final  . GFR calc non Af Amer 06/01/2015 46* >60 mL/min Final  . GFR calc Af Amer 06/01/2015 53* >60 mL/min Final   Comment: (NOTE) The eGFR has been calculated using the CKD EPI equation. This calculation has not been validated in all clinical situations. eGFR's persistently <60 mL/min signify possible Chronic Kidney Disease.   . Anion gap 06/01/2015 8  5 - 15 Final  . MRSA by PCR 06/01/2015 NEGATIVE  NEGATIVE Final   Comment:        The GeneXpert MRSA Assay (FDA approved for NASAL specimens only), is one component of a comprehensive MRSA colonization surveillance program. It is not intended to diagnose MRSA infection nor to guide or monitor treatment  for MRSA infections.   . Magnesium 06/02/2015 1.3* 1.7 - 2.4 mg/dL Final  . Sodium 06/02/2015 131* 135 - 145 mmol/L Final  . Potassium 06/02/2015 3.6  3.5 - 5.1 mmol/L Final  . Chloride 06/02/2015 97* 101 - 111 mmol/L Final  . CO2 06/02/2015 25  22 - 32 mmol/L Final  . Glucose, Bld 06/02/2015 115* 65 - 99 mg/dL Final  . BUN 06/02/2015 12  6 - 20 mg/dL Final  . Creatinine, Ser 06/02/2015 1.28* 0.61 - 1.24 mg/dL Final  . Calcium 06/02/2015 7.8* 8.9 - 10.3 mg/dL Final  . GFR calc non Af Amer 06/02/2015 51* >60 mL/min Final  . GFR calc Af Amer 06/02/2015 59* >60 mL/min Final   Comment: (NOTE) The eGFR has been calculated using the CKD EPI equation. This calculation has not been validated in all clinical situations. eGFR's persistently <60 mL/min signify possible Chronic Kidney Disease.   . Anion gap 06/02/2015 9  5 - 15 Final  . Sodium 06/03/2015 130* 135 - 145 mmol/L Final  . Potassium 06/03/2015 3.0* 3.5 - 5.1 mmol/L Final  . Chloride 06/03/2015 96* 101 - 111 mmol/L Final  . CO2 06/03/2015 28  22 - 32 mmol/L Final  . Glucose, Bld 06/03/2015 115* 65 - 99 mg/dL Final  . BUN 06/03/2015 10  6 - 20 mg/dL Final  . Creatinine, Ser 06/03/2015 1.12  0.61 - 1.24 mg/dL Final  . Calcium 06/03/2015 7.5* 8.9 - 10.3 mg/dL Final  . GFR calc non Af Amer 06/03/2015 >60  >60 mL/min Final  . GFR calc Af Amer 06/03/2015 >60  >60 mL/min Final   Comment: (NOTE) The eGFR has been calculated using the CKD EPI equation. This calculation has not been validated in all clinical situations. eGFR's persistently <60 mL/min signify possible Chronic Kidney Disease.   . Anion gap 06/03/2015 6  5 - 15  Final  . Hemoglobin 06/03/2015 7.2* 13.0 - 17.0 g/dL Final  . HCT 65/45/5766 20.4* 39.0 - 52.0 % Final  . Magnesium 06/03/2015 2.6* 1.7 - 2.4 mg/dL Final  . Sodium 30/58/6691 132* 135 - 145 mmol/L Final  . Potassium 06/04/2015 4.1  3.5 - 5.1 mmol/L Final   DELTA CHECK NOTED  . Chloride 06/04/2015 98* 101 - 111  mmol/L Final  . CO2 06/04/2015 28  22 - 32 mmol/L Final  . Glucose, Bld 06/04/2015 92  65 - 99 mg/dL Final  . BUN 31/44/3822 12  6 - 20 mg/dL Final  . Creatinine, Ser 06/04/2015 1.33* 0.61 - 1.24 mg/dL Final  . Calcium 91/45/1319 7.8* 8.9 - 10.3 mg/dL Final  . GFR calc non Af Amer 06/04/2015 49* >60 mL/min Final  . GFR calc Af Amer 06/04/2015 57* >60 mL/min Final   Comment: (NOTE) The eGFR has been calculated using the CKD EPI equation. This calculation has not been validated in all clinical situations. eGFR's persistently <60 mL/min signify possible Chronic Kidney Disease.   . Anion gap 06/04/2015 6  5 - 15 Final  . Hemoglobin 06/04/2015 7.1* 13.0 - 17.0 g/dL Final  . HCT 68/33/8951 20.1* 39.0 - 52.0 % Final  . Sodium 06/05/2015 132* 135 - 145 mmol/L Final  . Potassium 06/05/2015 3.9  3.5 - 5.1 mmol/L Final  . Chloride 06/05/2015 96* 101 - 111 mmol/L Final  . CO2 06/05/2015 28  22 - 32 mmol/L Final  . Glucose, Bld 06/05/2015 93  65 - 99 mg/dL Final  . BUN 23/22/6577 15  6 - 20 mg/dL Final  . Creatinine, Ser 06/05/2015 1.40* 0.61 - 1.24 mg/dL Final  . Calcium 66/59/8549 8.0* 8.9 - 10.3 mg/dL Final  . GFR calc non Af Amer 06/05/2015 46* >60 mL/min Final  . GFR calc Af Amer 06/05/2015 53* >60 mL/min Final   Comment: (NOTE) The eGFR has been calculated using the CKD EPI equation. This calculation has not been validated in all clinical situations. eGFR's persistently <60 mL/min signify possible Chronic Kidney Disease.   . Anion gap 06/05/2015 8  5 - 15 Final  . Hemoglobin 06/05/2015 7.2* 13.0 - 17.0 g/dL Final  . HCT 37/87/9232 20.1* 39.0 - 52.0 % Final    Dg Chest Portable 1 View  06/01/2015  CLINICAL DATA:  Fall when right hip fracture. Preoperative respiratory exam. EXAM: PORTABLE CHEST 1 VIEW COMPARISON:  12/18/2014 FINDINGS: Stable chronic lung disease and elevation of the right hemidiaphragm. There is no evidence of pulmonary edema, consolidation, pneumothorax, nodule or  pleural fluid. The heart size and mediastinal contours are within normal limits. IMPRESSION: Stable chronic lung disease.  No acute findings. Electronically Signed   By: Irish Lack M.D.   On: 06/01/2015 09:51   Dg Hip Unilat With Pelvis 2-3 Views Right  06/01/2015  CLINICAL DATA:  Pt c/o severe hip pain x 3hrs today s/p "sat down hard" when walking with his roller-walker this morning and slipped. EXAM: DG HIP (WITH OR WITHOUT PELVIS) 2-3V RIGHT COMPARISON:  04/10/2015 FINDINGS: There is a fracture of the right femoral neck, mid cervical, mildly comminuted and displaced, with the distal fracture component displacing superiorly by approximately 1 cm. There is no significant varus or valgus angulation. There is mild apex anterior angulation. Bones are diffusely demineralized. No other fractures. Hip joints are normally aligned. IMPRESSION: Mildly displaced fracture of the right femoral neck. Electronically Signed   By: Amie Portland M.D.   On: 06/01/2015 08:36  Dg Femur, Min 2 Views Right  06/01/2015  CLINICAL DATA:  One hip pain for 3 hours today after sitting. EXAM: RIGHT FEMUR 2 VIEWS COMPARISON:  None. FINDINGS: The proximal right femur is captured on today' s hip radiographs, and appears to show a femoral neck fracture. No femoral shaft or distal femoral fracture is identified. The distal margin of the right lateral femoral condyle is not entirely included on the frontal view and both views are somewhat oblique. Bony demineralization is present. I cannot exclude a small knee effusion. Degenerative spurring in the knee. IMPRESSION: 1. Osteoarthritis in the knee along with bony demineralization. 2. The proximal femur is shown on the hip radiographs and demonstrates a femoral neck fracture. Electronically Signed   By: Van Clines M.D.   On: 06/01/2015 08:38     Assessment/Plan   ICD-9-CM ICD-10-CM   1. Uncontrolled pain 780.96 R52   2. Femoral neck fracture, right, closed, initial  encounter 820.8 S72.001A   3. GOUT 274.9 M10.9   4. Essential hypertension 401.9 I10   5. Alcoholic cirrhosis of liver without ascites (HCC) 571.2 K70.30   6. Anorexia 783.0 R63.0   7. Persistent atrial fibrillation (HCC) 427.31 I48.1     Add OxyIR 89m take 1-2 tabs po q4hrs prn breakthrough pain  NSS IV x 1L  Cont other meds as ordered  PT/OT as ordered  F/u with Ortho as scheduled  GOAL: short term rehab and d/c home when medically appropriate. Communicated with pt and nursing.  Will follow  Jovonne Wilton S. CPerlie Gold PScottsdale Healthcare [facility] [address]GDumont [place] [phone] (Monday-Friday 8 AM - 5 PM) ([phone] 5 PM and follow prompts

## 2015-06-19 ENCOUNTER — Inpatient Hospital Stay (HOSPITAL_COMMUNITY)
Admission: EM | Admit: 2015-06-19 | Discharge: 2015-06-25 | DRG: 872 | Disposition: A | Discharge status: Skilled Nursing Facility | Payer: Medicare Other | Attending: Internal Medicine | Admitting: Internal Medicine

## 2015-06-19 ENCOUNTER — Emergency Department (HOSPITAL_COMMUNITY): Payer: Medicare Other

## 2015-06-19 ENCOUNTER — Encounter (HOSPITAL_COMMUNITY): Payer: Self-pay

## 2015-06-19 DIAGNOSIS — D631 Anemia in chronic kidney disease: Secondary | ICD-10-CM | POA: Diagnosis not present

## 2015-06-19 DIAGNOSIS — R58 Hemorrhage, not elsewhere classified: Secondary | ICD-10-CM

## 2015-06-19 DIAGNOSIS — N189 Chronic kidney disease, unspecified: Secondary | ICD-10-CM | POA: Diagnosis not present

## 2015-06-19 DIAGNOSIS — N390 Urinary tract infection, site not specified: Secondary | ICD-10-CM | POA: Diagnosis not present

## 2015-06-19 DIAGNOSIS — I4891 Unspecified atrial fibrillation: Secondary | ICD-10-CM | POA: Diagnosis not present

## 2015-06-19 DIAGNOSIS — R0602 Shortness of breath: Secondary | ICD-10-CM | POA: Diagnosis not present

## 2015-06-19 DIAGNOSIS — R05 Cough: Secondary | ICD-10-CM | POA: Diagnosis not present

## 2015-06-19 DIAGNOSIS — Z9289 Personal history of other medical treatment: Secondary | ICD-10-CM

## 2015-06-19 DIAGNOSIS — L89100 Pressure ulcer of unspecified part of back, unstageable: Secondary | ICD-10-CM | POA: Diagnosis not present

## 2015-06-19 DIAGNOSIS — I959 Hypotension, unspecified: Secondary | ICD-10-CM | POA: Diagnosis not present

## 2015-06-19 DIAGNOSIS — E46 Unspecified protein-calorie malnutrition: Secondary | ICD-10-CM

## 2015-06-19 DIAGNOSIS — M109 Gout, unspecified: Secondary | ICD-10-CM | POA: Diagnosis not present

## 2015-06-19 DIAGNOSIS — E876 Hypokalemia: Secondary | ICD-10-CM | POA: Diagnosis not present

## 2015-06-19 DIAGNOSIS — Z791 Long term (current) use of non-steroidal anti-inflammatories (NSAID): Secondary | ICD-10-CM | POA: Diagnosis not present

## 2015-06-19 DIAGNOSIS — S72001A Fracture of unspecified part of neck of right femur, initial encounter for closed fracture: Secondary | ICD-10-CM | POA: Diagnosis not present

## 2015-06-19 DIAGNOSIS — L89152 Pressure ulcer of sacral region, stage 2: Secondary | ICD-10-CM | POA: Diagnosis present

## 2015-06-19 DIAGNOSIS — K7030 Alcoholic cirrhosis of liver without ascites: Secondary | ICD-10-CM | POA: Diagnosis not present

## 2015-06-19 DIAGNOSIS — D638 Anemia in other chronic diseases classified elsewhere: Secondary | ICD-10-CM

## 2015-06-19 DIAGNOSIS — A419 Sepsis, unspecified organism: Secondary | ICD-10-CM | POA: Diagnosis not present

## 2015-06-19 DIAGNOSIS — Z7409 Other reduced mobility: Secondary | ICD-10-CM | POA: Diagnosis not present

## 2015-06-19 DIAGNOSIS — Z978 Presence of other specified devices: Secondary | ICD-10-CM

## 2015-06-19 DIAGNOSIS — Z960 Presence of urogenital implants: Secondary | ICD-10-CM

## 2015-06-19 DIAGNOSIS — D473 Essential (hemorrhagic) thrombocythemia: Secondary | ICD-10-CM | POA: Diagnosis not present

## 2015-06-19 DIAGNOSIS — D519 Vitamin B12 deficiency anemia, unspecified: Secondary | ICD-10-CM

## 2015-06-19 DIAGNOSIS — Z87891 Personal history of nicotine dependence: Secondary | ICD-10-CM

## 2015-06-19 DIAGNOSIS — M25551 Pain in right hip: Secondary | ICD-10-CM | POA: Diagnosis not present

## 2015-06-19 DIAGNOSIS — Z681 Body mass index (BMI) 19 or less, adult: Secondary | ICD-10-CM | POA: Diagnosis not present

## 2015-06-19 DIAGNOSIS — E785 Hyperlipidemia, unspecified: Secondary | ICD-10-CM | POA: Diagnosis not present

## 2015-06-19 DIAGNOSIS — R296 Repeated falls: Secondary | ICD-10-CM | POA: Diagnosis not present

## 2015-06-19 DIAGNOSIS — R918 Other nonspecific abnormal finding of lung field: Secondary | ICD-10-CM | POA: Diagnosis not present

## 2015-06-19 DIAGNOSIS — I10 Essential (primary) hypertension: Secondary | ICD-10-CM | POA: Diagnosis not present

## 2015-06-19 DIAGNOSIS — R52 Pain, unspecified: Secondary | ICD-10-CM

## 2015-06-19 DIAGNOSIS — L89159 Pressure ulcer of sacral region, unspecified stage: Secondary | ICD-10-CM | POA: Diagnosis not present

## 2015-06-19 DIAGNOSIS — R531 Weakness: Secondary | ICD-10-CM | POA: Diagnosis present

## 2015-06-19 DIAGNOSIS — L89150 Pressure ulcer of sacral region, unstageable: Secondary | ICD-10-CM | POA: Diagnosis not present

## 2015-06-19 DIAGNOSIS — I129 Hypertensive chronic kidney disease with stage 1 through stage 4 chronic kidney disease, or unspecified chronic kidney disease: Secondary | ICD-10-CM | POA: Diagnosis present

## 2015-06-19 DIAGNOSIS — S72001D Fracture of unspecified part of neck of right femur, subsequent encounter for closed fracture with routine healing: Secondary | ICD-10-CM | POA: Diagnosis not present

## 2015-06-19 DIAGNOSIS — Z531 Procedure and treatment not carried out because of patient's decision for reasons of belief and group pressure: Secondary | ICD-10-CM | POA: Diagnosis not present

## 2015-06-19 DIAGNOSIS — R410 Disorientation, unspecified: Secondary | ICD-10-CM | POA: Diagnosis not present

## 2015-06-19 DIAGNOSIS — Z7401 Bed confinement status: Secondary | ICD-10-CM | POA: Diagnosis not present

## 2015-06-19 DIAGNOSIS — D72829 Elevated white blood cell count, unspecified: Secondary | ICD-10-CM | POA: Diagnosis not present

## 2015-06-19 DIAGNOSIS — D539 Nutritional anemia, unspecified: Secondary | ICD-10-CM

## 2015-06-19 DIAGNOSIS — L8990 Pressure ulcer of unspecified site, unspecified stage: Secondary | ICD-10-CM

## 2015-06-19 DIAGNOSIS — D649 Anemia, unspecified: Secondary | ICD-10-CM | POA: Diagnosis not present

## 2015-06-19 DIAGNOSIS — D6959 Other secondary thrombocytopenia: Secondary | ICD-10-CM | POA: Diagnosis not present

## 2015-06-19 DIAGNOSIS — N312 Flaccid neuropathic bladder, not elsewhere classified: Secondary | ICD-10-CM | POA: Diagnosis present

## 2015-06-19 DIAGNOSIS — E538 Deficiency of other specified B group vitamins: Secondary | ICD-10-CM | POA: Diagnosis present

## 2015-06-19 LAB — BASIC METABOLIC PANEL
Anion gap: 8 (ref 5–15)
BUN: 16 mg/dL (ref 6–20)
CHLORIDE: 104 mmol/L (ref 101–111)
CO2: 23 mmol/L (ref 22–32)
CREATININE: 1.13 mg/dL (ref 0.61–1.24)
Calcium: 8.3 mg/dL — ABNORMAL LOW (ref 8.9–10.3)
GFR calc Af Amer: >60 mL/min (ref >60)
GFR calc non Af Amer: 59 mL/min — ABNORMAL LOW (ref >60)
GLUCOSE: 106 mg/dL — AB (ref 65–99)
Potassium: 4.1 mmol/L (ref 3.5–5.1)
SODIUM: 135 mmol/L (ref 135–145)

## 2015-06-19 LAB — CBC WITH DIFFERENTIAL/PLATELET
Basophils Absolute: 0 10*3/uL (ref 0.0–0.1)
Basophils Relative: 0 %
EOS ABS: 0.1 10*3/uL (ref 0.0–0.7)
EOS PCT: 1 %
HCT: 22.8 % — ABNORMAL LOW (ref 39.0–52.0)
Hemoglobin: 7.5 g/dL — ABNORMAL LOW (ref 13.0–17.0)
LYMPHS ABS: 1.8 10*3/uL (ref 0.7–4.0)
Lymphocytes Relative: 8 %
MCH: 28.1 pg (ref 26.0–34.0)
MCHC: 32.9 g/dL (ref 30.0–36.0)
MCV: 85.4 fL (ref 78.0–100.0)
MONO ABS: 3 10*3/uL — AB (ref 0.1–1.0)
MONOS PCT: 13 %
Neutro Abs: 18.3 10*3/uL — ABNORMAL HIGH (ref 1.7–7.7)
Neutrophils Relative %: 78 %
PLATELETS: 836 10*3/uL — AB (ref 150–400)
RBC: 2.67 MIL/uL — ABNORMAL LOW (ref 4.22–5.81)
RDW: 18.5 % — AB (ref 11.5–15.5)
WBC: 23.3 10*3/uL — AB (ref 4.0–10.5)

## 2015-06-19 LAB — URINALYSIS, ROUTINE W REFLEX MICROSCOPIC
Bilirubin Urine: NEGATIVE
GLUCOSE, UA: NEGATIVE mg/dL
HGB URINE DIPSTICK: NEGATIVE
KETONES UR: NEGATIVE mg/dL
Nitrite: NEGATIVE
PH: 5.5 (ref 5.0–8.0)
PROTEIN: NEGATIVE mg/dL
Specific Gravity, Urine: 1.017 (ref 1.005–1.030)

## 2015-06-19 LAB — LACTIC ACID, PLASMA
LACTIC ACID, VENOUS: 2.2 mmol/L — AB (ref 0.5–2.0)
Lactic Acid, Venous: 1.2 mmol/L (ref 0.5–2.0)
Lactic Acid, Venous: 1.7 mmol/L (ref 0.5–2.0)

## 2015-06-19 LAB — PROTIME-INR
INR: 1.24 (ref 0.00–1.49)
PROTHROMBIN TIME: 15.8 s — AB (ref 11.6–15.2)

## 2015-06-19 LAB — URINE MICROSCOPIC-ADD ON: Squamous Epithelial / LPF: NONE SEEN

## 2015-06-19 LAB — PROCALCITONIN: Procalcitonin: 0.43 ng/mL

## 2015-06-19 LAB — APTT: aPTT: 43 seconds — ABNORMAL HIGH (ref 24–37)

## 2015-06-19 MED ORDER — SODIUM CHLORIDE 0.9 % IV BOLUS (SEPSIS)
500.0000 mL | Freq: Once | INTRAVENOUS | Status: AC
  Administered 2015-06-19: 500 mL via INTRAVENOUS

## 2015-06-19 MED ORDER — ONDANSETRON HCL 4 MG PO TABS: 4.0000 mg | ORAL_TABLET | Freq: Four times a day (QID) | ORAL | Status: DC | PRN

## 2015-06-19 MED ORDER — SODIUM CHLORIDE 0.9 % IV SOLN
INTRAVENOUS | Status: DC
  Administered 2015-06-19 – 2015-06-22 (×4): via INTRAVENOUS

## 2015-06-19 MED ORDER — VITAMIN B-1 100 MG PO TABS
100.0000 mg | ORAL_TABLET | Freq: Every day | ORAL | Status: DC
  Administered 2015-06-20 – 2015-06-25 (×6): 100 mg via ORAL
  Filled 2015-06-19 (×6): qty 1

## 2015-06-19 MED ORDER — FENTANYL CITRATE (PF) 100 MCG/2ML IJ SOLN
50.0000 ug | INTRAMUSCULAR | Status: DC | PRN
  Administered 2015-06-20 (×2): 50 ug via INTRAVENOUS
  Filled 2015-06-19 (×2): qty 2

## 2015-06-19 MED ORDER — MIRTAZAPINE 15 MG PO TABS
15.0000 mg | ORAL_TABLET | Freq: Every day | ORAL | Status: DC
  Administered 2015-06-19 – 2015-06-24 (×5): 15 mg via ORAL
  Filled 2015-06-19 (×6): qty 1

## 2015-06-19 MED ORDER — ONDANSETRON HCL 4 MG/2ML IJ SOLN
4.0000 mg | Freq: Four times a day (QID) | INTRAMUSCULAR | Status: DC | PRN
  Administered 2015-06-19 – 2015-06-22 (×2): 4 mg via INTRAVENOUS
  Filled 2015-06-19 (×2): qty 2

## 2015-06-19 MED ORDER — ACETAMINOPHEN 650 MG RE SUPP
650.0000 mg | Freq: Four times a day (QID) | RECTAL | Status: DC | PRN
Start: 2015-06-19 — End: 2015-06-25

## 2015-06-19 MED ORDER — ACETAMINOPHEN 325 MG PO TABS: 650.0000 mg | ORAL_TABLET | Freq: Four times a day (QID) | ORAL | Status: DC | PRN

## 2015-06-19 MED ORDER — METAXALONE 800 MG PO TABS
800.0000 mg | ORAL_TABLET | Freq: Three times a day (TID) | ORAL | Status: DC
  Administered 2015-06-19 – 2015-06-25 (×18): 800 mg via ORAL
  Filled 2015-06-19 (×21): qty 1

## 2015-06-19 MED ORDER — VANCOMYCIN HCL 10 G IV SOLR
1500.0000 mg | Freq: Once | INTRAVENOUS | Status: AC
  Administered 2015-06-19: 1500 mg via INTRAVENOUS
  Filled 2015-06-19: qty 1500

## 2015-06-19 MED ORDER — ENSURE ENLIVE PO LIQD
237.0000 mL | Freq: Three times a day (TID) | ORAL | Status: DC
  Administered 2015-06-20 – 2015-06-23 (×9): 237 mL via ORAL

## 2015-06-19 MED ORDER — LACTULOSE 10 GM/15ML PO SOLN
20.0000 g | Freq: Every day | ORAL | Status: DC
  Administered 2015-06-19 – 2015-06-25 (×7): 20 g via ORAL
  Filled 2015-06-19 (×7): qty 30

## 2015-06-19 MED ORDER — APIXABAN 5 MG PO TABS
5.0000 mg | ORAL_TABLET | Freq: Two times a day (BID) | ORAL | Status: DC
  Administered 2015-06-19 – 2015-06-25 (×12): 5 mg via ORAL
  Filled 2015-06-19 (×12): qty 1

## 2015-06-19 MED ORDER — DRONABINOL 2.5 MG PO CAPS
2.5000 mg | ORAL_CAPSULE | Freq: Every day | ORAL | Status: DC
  Administered 2015-06-20 – 2015-06-25 (×6): 2.5 mg via ORAL
  Filled 2015-06-19 (×6): qty 1

## 2015-06-19 MED ORDER — DILTIAZEM HCL ER COATED BEADS 240 MG PO CP24
240.0000 mg | ORAL_CAPSULE | Freq: Every day | ORAL | Status: DC
  Administered 2015-06-19 – 2015-06-25 (×7): 240 mg via ORAL
  Filled 2015-06-19 (×7): qty 1

## 2015-06-19 MED ORDER — LEVOFLOXACIN IN D5W 750 MG/150ML IV SOLN
750.0000 mg | Freq: Once | INTRAVENOUS | Status: AC
  Administered 2015-06-19: 750 mg via INTRAVENOUS
  Filled 2015-06-19: qty 150

## 2015-06-19 MED ORDER — COLCHICINE 0.6 MG PO TABS
0.6000 mg | ORAL_TABLET | Freq: Every day | ORAL | Status: DC
  Administered 2015-06-19 – 2015-06-24 (×6): 0.6 mg via ORAL
  Filled 2015-06-19 (×7): qty 1

## 2015-06-19 MED ORDER — VANCOMYCIN HCL IN DEXTROSE 750-5 MG/150ML-% IV SOLN
750.0000 mg | Freq: Two times a day (BID) | INTRAVENOUS | Status: DC
  Administered 2015-06-20 – 2015-06-21 (×3): 750 mg via INTRAVENOUS
  Filled 2015-06-19 (×6): qty 150

## 2015-06-19 MED ORDER — FOLIC ACID 1 MG PO TABS
2.0000 mg | ORAL_TABLET | Freq: Every day | ORAL | Status: DC
  Administered 2015-06-20 – 2015-06-21 (×2): 2 mg via ORAL
  Filled 2015-06-19 (×2): qty 2

## 2015-06-19 MED ORDER — ENOXAPARIN SODIUM 40 MG/0.4ML ~~LOC~~ SOLN: 40.0000 mg | SUBCUTANEOUS | Status: DC

## 2015-06-19 MED ORDER — LEVOFLOXACIN IN D5W 750 MG/150ML IV SOLN
750.0000 mg | INTRAVENOUS | Status: DC
  Administered 2015-06-20 – 2015-06-23 (×4): 750 mg via INTRAVENOUS
  Filled 2015-06-19 (×5): qty 150

## 2015-06-19 MED ORDER — HYDROCODONE-ACETAMINOPHEN 10-325 MG PO TABS
1.0000 | ORAL_TABLET | ORAL | Status: DC
  Administered 2015-06-19 – 2015-06-25 (×33): 1 via ORAL
  Filled 2015-06-19 (×33): qty 1

## 2015-06-19 NOTE — ED Notes (Signed)
There is an order for a foley cathy the pt already has a foley cath  From the nursing facility.  The pt just returned from xray    Med given iv delayed due to xray.Marland Kitchen  He is requesting something to drink

## 2015-06-19 NOTE — H&P (Addendum)
[facility] History and Physical  Bob Wilson:[id number] DOB: [birth year] DOA: 06/19/2015   PCP: Owens Loffler, MD    Chief Complaint: sent from SNF due to leukocytosis  HPI: Bob Wilson is a 81 y.o. male with a foley cath since 7/16, recent right hip fracture that has not been repaired, anemia of chronic disease, HTN, alcoholic cirrhosis, A-fib and alcoholic neuropathy who is sent from the nursing facility for leukocytosis. He has a positive UA (chronic foley) and no other source of infection. He is tachycardic, hypotensive and has a mildly elevated Lactic acid. No suprapubic pain. No cough or URI symptoms. Has chronic loose stools from Lactulose. No vomiting. No fevers or chills.  Recent hip fracture has not been repaired due to anemia. 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 He received Iron infusions at the cancer center. He has been bed bound since his fx.     General: The patient denies anorexia, fever, weight loss Cardiac: Denies chest pain, syncope, palpitations, pedal edema  Respiratory: Denies cough, shortness of breath, wheezing GI: Denies severe indigestion/heartburn, abdominal pain, nausea, vomiting and constipation GU: no suprapubic pain Musculoskeletal: Denies arthritis  Skin: Denies suspicious skin lesions Neurologic: Denies focal weakness or numbness, change in vision Psychiatry: Denies depression or anxiety. Hematologic: no easy bruising or bleeding  All other systems reviewed and found to be negative.  Past Medical History  Diagnosis Date  . Anemia     NOS  . Arthritis   . Gout   . Hyperlipidemia   . Hypertension   . GERD (gastroesophageal reflux disease)   . Elevated PSA     multiple times, refused urology eval [facility] notes)  . ETOH abuse   . Medically noncompliant     noncompliant with follow ups, labs.  . Alcoholism ([place]) 06/25/2011  . Cirrhosis, alcoholic ([place]) 04/04/3845  . 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 Paroxysmal atrial fibrillation (HCC)   . Bilateral lower extremity edema     Chronic.  Marland Kitchen History of empyema of pleura 12/2011    s/p VATS decortication  . Dysrhythmia     hx of PAF  . Foley catheter in place   . Falls frequently   . 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 Chronic indwelling Foley catheter 04/12/2015    Past Surgical History  Procedure Laterality Date  . Lipoma excision    . Transthoracic echocardiogram  09/03/2014    Normal overall LV size. EF 55-60% with no regional W. MA. Trivial AI. Mild-moderate MR. Mild R. and LA dilation. Mild to moderate TR. PA pressures ~47 mmHg  . Video assisted thoracoscopy (vats)/empyema  12/2011  . Nm myoview ltd  10/12/14    LOW RISK - NO ISCHEMIA, DIAPHRAGMATIC ATTENUATION, NON-GATED  . Green light laser turp (transurethral resection of prostate N/A 12/21/2014    Procedure: GREEN LIGHT LASER TURP (TRANSURETHRAL RESECTION OF PROSTATE;  Surgeon: Ardis Hughs, MD;  Location: [facility];  Service: Urology;  Laterality: N/A;    Social History: stopped smoking 40 yrs ago. Just stopped drinking when he was admitted to the SNF Lives at a SNF for the past 3 wks.     Allergies  Allergen Reactions  . Other     NO BLOOD PRODUCTS(see blood/blood product refusal consent) pt in agreement with albumin   . Ace Inhibitors Other (See Comments)    REACTION: Angioedema  . Aspirin Other (See Comments)  Nose bleeds   . Lisinopril Swelling and Other (See Comments)    REACTION: Facial Swelling  . Rocephin [Ceftriaxone Sodium In Dextrose] Hives    Previously tolerated PCN and Ceph 2/14 Hives with ceftriaxone dose 2/15 No reaction with benadryl/ceftriaxone 2/16 Tolerating Zosyn w/o reaction    Family history:   Family History  Problem Relation Age of Onset  . Diabetes Mellitus II Father   . Stroke Mother       Prior to Admission medications   Medication Sig Start Date End Date Taking? Authorizing Provider  colchicine 0.6  MG tablet Take 0.6 mg by mouth at bedtime.   Yes Historical Provider, MD  cyanocobalamin (,VITAMIN B-12,) 1000 MCG/ML injection 1000 mcg [place] daily for 7 days from 06/05/15, then, 1000 mcg [place] weekly for 1 month, then, 1000 mcg [place] monthly. Patient taking differently: Inject 1,000 mcg into the skin every 30 (thirty) days. On the 18th of each Month 06/05/15  Yes Shanker Kristeen Mans, MD  Darbepoetin Alfa (ARANESP, ALBUMIN FREE,) 500 MCG/ML SOSY injection Inject 1 mL (500 mcg total) into the skin every 21 ( twenty-one) days. 06/05/15  Yes Shanker Kristeen Mans, MD  diltiazem (CARDIZEM CD) 240 MG 24 hr capsule Take 1 capsule (240 mg total) by mouth daily. 09/26/14  Yes Leonie Man, MD  dronabinol (MARINOL) 2.5 MG capsule Take 1 capsule (2.5 mg total) by mouth daily before lunch. 06/11/15 07/10/15 Yes Gerlene Fee, NP  folic acid (FOLVITE) 1 MG tablet Take 2 tablets (2 mg total) by mouth daily. 06/05/15  Yes Shanker Kristeen Mans, MD  furosemide (LASIX) 40 MG tablet Take 1 tablet (40 mg total) by mouth daily. 09/26/14  Yes Leonie Man, MD  HYDROcodone-acetaminophen [facility]) 10-325 MG tablet Take 1 tablet by mouth every 4 (four) hours.   Yes Historical Provider, MD  lactulose (CHRONULAC) 10 GM/15ML solution Take 30 mLs (20 g total) by mouth daily. 06/05/15  Yes Shanker Kristeen Mans, MD  metaxalone (SKELAXIN) 800 MG tablet Take 1 tablet (800 mg total) by mouth 3 (three) times daily. 06/10/15  Yes Gerlene Fee, NP  mirtazapine (REMERON) 15 MG tablet Take 1 tablet (15 mg total) by mouth at bedtime. 04/10/15  Yes Spencer Copland, MD  potassium chloride 20 MEQ TBCR Take 8 mEq by mouth daily. Patient taking differently: Take 20 mEq by mouth daily.  06/05/15  Yes Shanker Kristeen Mans, MD  thiamine 100 MG tablet Take 1 tablet (100 mg total) by mouth daily. 06/05/15  Yes Shanker Kristeen Mans, MD  feeding supplement, ENSURE ENLIVE, (ENSURE ENLIVE) LIQD Take 237 mLs by mouth 3 (three) times daily between meals. 06/05/15   Shanker  Kristeen Mans, MD  ferumoxytol (FERAHEME) 510 MG/17ML SOLN injection Inject 17 mLs (510 mg total) into the vein once a week. 06/05/15   Jonetta Osgood, MD     Physical Exam: Filed Vitals:   06/19/15 1445 06/19/15 1500 06/19/15 1515 06/19/15 1530  BP: 123/71 115/66 120/71 104/56  Pulse:  111  65  Temp:  98.6 F (37 C)    TempSrc:  Rectal    Resp: '24 20 26 28  '$ SpO2:  94%  100%     General: AAO x3, no distress HEENT: Normocephalic and Atraumatic, Mucous membranes pink                PERRLA; EOM intact; No scleral icterus,                 Nares: Patent, Oropharynx:  Clear, Fair Dentition                 Neck: FROM, no cervical lymphadenopathy, thyromegaly, carotid bruit or JVD;  Breasts: deferred CHEST WALL: No tenderness  CHEST: Normal respiration, clear to auscultation bilaterally  HEART: IIRR; no murmurs rubs or gallops + tachycardia BACK: No kyphosis or scoliosis; no CVA tenderness  GI: Positive Bowel Sounds, soft, non-tender; no masses, no organomegaly Rectal Exam: deferred MSK: No cyanosis, clubbing, or edema Genitalia: not examined  SKIN:  no rash or ulceration  CNS: Alert and Oriented x 4, Nonfocal exam, CN 2-12 intact  Labs on Admission:  Basic Metabolic Panel:  Recent Labs Lab 06/19/15 1658  NA 135  K 4.1  CL 104  CO2 23  GLUCOSE 106*  BUN 16  CREATININE 1.13  CALCIUM 8.3*   Liver Function Tests: No results for input(s): AST, ALT, ALKPHOS, BILITOT, PROT, ALBUMIN in the last 168 hours. No results for input(s): LIPASE, AMYLASE in the last 168 hours. No results for input(s): AMMONIA in the last 168 hours. CBC:  Recent Labs Lab 06/19/15 1658  WBC 23.3*  NEUTROABS 18.3*  HGB 7.5*  HCT 22.8*  MCV 85.4  PLT 836*   Cardiac Enzymes: No results for input(s): CKTOTAL, CKMB, CKMBINDEX, TROPONINI in the last 168 hours.  BNP (last 3 results)  Recent Labs  09/02/14 1526  BNP 431.0*    ProBNP (last 3 results) No results for input(s): PROBNP in the  last 8760 hours.  CBG: No results for input(s): GLUCAP in the last 168 hours.  Radiological Exams on Admission: Dg Chest 2 View  06/19/2015  CLINICAL DATA:  Chest pain, shortness of breath and cough for 1 week, hypertension, former smoker, cirrhosis, paroxysmal atrial fibrillation EXAM: CHEST  2 VIEW COMPARISON:  06/01/2015 FINDINGS: Normal heart size, mediastinal contours, and pulmonary vascularity. Minimal atherosclerotic calcification and elongation of thoracic aorta. Lungs appear hyperinflated with chronic elevation of RIGHT diaphragm. No acute infiltrate, pleural effusion or pneumothorax. Probable LEFT nipple shadow, seen external to the costal margin on prior exam. No acute osseous findings. IMPRESSION: Probable COPD changes with chronic elevation of RIGHT diaphragm. No acute abnormalities. Electronically Signed   By: Lavonia Dana M.D.   On: 06/19/2015 16:00       Assessment/Plan Principal Problem:   Sepsis secondary to complicated UTI in setting of chronic Foley - HR improved to low 100s from 130s - RN just started his IVF boluses- hold Lasix - will start maintenance fluids after boluses complete - has received Levaquin which I will continue- add Vanc for now as SNF resident - admit to SDU  Active Problems:    A- fib with RVR CHA2DS2-VASc Score 3 - may need a slow Cardizem infusion started tonight if HR remains uncontrolled - holding parameters on oral Cardizem which I will order to start tomorrow (if BP improved) - not on anticoagulation (alcoholic, had frequent falls- due to alcoholic neuropathy?) but as he is no longer a fall risk due to being bed bound, can be started on anticoagulation at this point (may be just temporary) - start Eliquis  Right hip fx - bedrest- should have at least been on DVT prophylaxis at SNF while bed bound   Anemia of chronic disease - Iron infusions per hematology- need Hb > 8 in order to have surgery  Thrombocytosis - acute- follow -  Sacral  decubitus ulcers - due to being bed bound - wound care eval- air mattress- recommend Air mattress at  SNF as he is not able to be re-positioned due to hip fx.   Alcoholic cirrhosis - cont Lactulose  Protein calorie malnutrition  - cont Ensure  Gout - cont Colchicine   Consulted:   Code Status: Full code  Family Communication:   DVT Prophylaxis: Lovenox  Time spent: 69 min  [place], MD [facility]  If 7PM-7AM, please contact night-coverage [email or web address] 06/19/2015, 6:57 PM

## 2015-06-19 NOTE — ED Notes (Signed)
levaquin started

## 2015-06-19 NOTE — ED Notes (Signed)
Pt came via [facility] from facility. Pt has low hgb - 7.1. 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 Pt has indwelling catheter.

## 2015-06-19 NOTE — Progress Notes (Signed)
ANTICOAGULATION AND ANTIBIOTIC CONSULT NOTE - Initial Consult  Pharmacy Consult for apixaban and vancomycin Indication: atrial fibrillation/rule out sepsis  Allergies  Allergen Reactions  . Other     NO BLOOD PRODUCTS(see blood/blood product refusal consent) pt in agreement with albumin   . Ace Inhibitors Other (See Comments)    REACTION: Angioedema  . Aspirin Other (See Comments)    Nose bleeds   . Lisinopril Swelling and Other (See Comments)    REACTION: Facial Swelling  . Rocephin [Ceftriaxone Sodium In Dextrose] Hives    Previously tolerated PCN and Ceph 2/14 Hives with ceftriaxone dose 2/15 No reaction with benadryl/ceftriaxone 2/16 Tolerating Zosyn w/o reaction    Vital Signs: Temp: 98.9 F (37.2 C) (11/30 1630) Temp Source: Rectal (11/30 1500) BP: 108/69 mmHg (11/30 1900) Pulse Rate: 139 (11/30 1845)  Labs:  Recent Labs  06/19/15 1658  HGB 7.5*  HCT 22.8*  PLT 836*  CREATININE 1.13    Estimated Creatinine Clearance: 54.5 mL/min (by C-G formula based on Cr of 1.13).   Medical History: Past Medical History  Diagnosis Date  . Anemia     NOS  . Arthritis   . Gout   . Hyperlipidemia   . Hypertension   . GERD (gastroesophageal reflux disease)   . Elevated PSA     multiple times, refused urology eval [facility] notes)  . ETOH abuse   . Medically noncompliant     noncompliant with follow ups, labs.  . Alcoholism ([place]) 06/25/2011  . Cirrhosis, alcoholic ([place]) 7/89/3810  . 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 Paroxysmal atrial fibrillation (HCC)   . Bilateral lower extremity edema     Chronic.  Marland Kitchen History of empyema of pleura 12/2011    s/p VATS decortication  . Dysrhythmia     hx of PAF  . Foley catheter in place   . Falls frequently   . 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 Chronic indwelling Foley catheter 04/12/2015    Assessment: 81 yo m from [place] presenting to the ED on 11/30 with low hemoglobin. Pharmacy is consulted to dose vancomycin for  r/o sepsis.  Patient is already on Levaquin - has received a load in the ED, will put in maintenance dose. Wbc 23.3, tmax 98.9, SCr 1.13, CrCl ~55 ml/min.   Vanc 11/30 >> Levaquin 11/30 >>  11/30 UCx: sent  Pharmacy also consulted to dose apixaban for afib.  Patient is currently not on any anticoagulation at home because he is a fall risk.  Patient bed bound now, so anticoagulation is started. Hgb 7.5 right now ([employer], so will no accept transfusions), plts 836.   Goal of Therapy:  Vancomycin trough 15-20  Monitor platelets by anticoagulation protocol: Yes   Plan:  Vancomycin 1,500 mg IV load x 1 Vancomycin 750 mg IV q12h Levaquin 750 mg IV q24h Apixaban 5 mg PO BID Monitor renal fx, cbc, cx, clinical course  Sha Burling L. Nicole Kindred, PharmD PGY2 Infectious Diseases Pharmacy Resident Pager: [phone] 06/19/2015 7:22 PM

## 2015-06-19 NOTE — ED Notes (Signed)
To x-ray

## 2015-06-19 NOTE — ED Notes (Signed)
Admitting docor at the bedside

## 2015-06-19 NOTE — ED Notes (Signed)
Pt is from [facility] [address]

## 2015-06-19 NOTE — ED Notes (Signed)
It is the pts right hip that is broken

## 2015-06-19 NOTE — ED Notes (Signed)
The pt has had a broken rt hip for 3 weeks  He came from [facility].  hgb low  Unwilling to take a blood transfusion because of his religion  Pt turned with much effort due pts pain.  Yellow partially formed stool cleaned from the pt clean diaper placed.  He has  Some skin break down on his lower buttocks

## 2015-06-19 NOTE — ED Provider Notes (Signed)
CSN: [id number]     Arrival date & time 06/19/15  1323 History   First MD Initiated Contact with Patient 06/19/15 1341     Chief Complaint  Patient presents with  . Abnormal Lab     (Consider location/radiation/quality/duration/timing/severity/associated sxs/prior Treatment) HPI Comments: 81 year old male with history of high blood pressure, malnutrition, chronic alcoholism, atrial fibrillation, recent femoral neck fracture that is currently nonoperative due to medical history, anemia and patient refusing any blood transfusions presents with general weakness, mild confusion. Symptoms of been worsening over the past 2 days. Patient does have a Foley catheter that was in place prior to breaking his hip. Patient still has persistent pain with movement in the hip and is in rehabilitation facility. Patient had reported elevated white blood count and fever at the rehabilitation facility and was sent over for further evaluation.  The history is provided by the patient and a relative.    Past Medical History  Diagnosis Date  . Anemia     NOS  . Arthritis   . Gout   . Hyperlipidemia   . Hypertension   . GERD (gastroesophageal reflux disease)   . Elevated PSA     multiple times, refused urology eval [facility] notes)  . ETOH abuse   . Medically noncompliant     noncompliant with follow ups, labs.  . Alcoholism ([place]) 06/25/2011  . Cirrhosis, alcoholic ([place]) 3/84/6659  . 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 Paroxysmal atrial fibrillation (HCC)   . Bilateral lower extremity edema     Chronic.  Marland Kitchen History of empyema of pleura 12/2011    s/p VATS decortication  . Dysrhythmia     hx of PAF  . Foley catheter in place   . Falls frequently   . 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 Chronic indwelling Foley catheter 04/12/2015   Past Surgical History  Procedure Laterality Date  . Lipoma excision    . Transthoracic echocardiogram  09/03/2014    Normal overall LV size. EF 55-60% with no regional W. MA.  Trivial AI. Mild-moderate MR. Mild R. and LA dilation. Mild to moderate TR. PA pressures ~47 mmHg  . Video assisted thoracoscopy (vats)/empyema  12/2011  . Nm myoview ltd  10/12/14    LOW RISK - NO ISCHEMIA, DIAPHRAGMATIC ATTENUATION, NON-GATED  . Green light laser turp (transurethral resection of prostate N/A 12/21/2014    Procedure: GREEN LIGHT LASER TURP (TRANSURETHRAL RESECTION OF PROSTATE;  Surgeon: Ardis Hughs, MD;  Location: [facility];  Service: Urology;  Laterality: N/A;   Family History  Problem Relation Age of Onset  . Diabetes Mellitus II Father   . Stroke Mother    Social History  Substance Use Topics  . Smoking status: Former Smoker -- 0.25 packs/day for 29 years    Types: Cigarettes    Quit date: 07/20/1974  . Smokeless tobacco: Never Used     Comment: Remote- quit 45 years ago.  . Alcohol Use: 0.0 oz/week    0 Standard drinks or equivalent per week     Comment: states that he drinks "couple pints of gin" every day.  nnow has only had maybe one shot of June in the last few weeks.    Review of Systems  Constitutional: Positive for fatigue. Negative for fever and chills.  HENT: Negative for congestion.   Eyes: Negative for visual disturbance.  Respiratory: Negative for shortness of breath.   Cardiovascular: Negative for chest pain.  Gastrointestinal: Negative for vomiting and abdominal pain.  Genitourinary:  Negative for dysuria and flank pain.  Musculoskeletal: Positive for arthralgias. Negative for back pain, neck pain and neck stiffness.  Skin: Negative for rash.  Neurological: Negative for light-headedness and headaches.  Psychiatric/Behavioral: Positive for confusion.      Allergies  Other; Ace inhibitors; Aspirin; Lisinopril; and Rocephin  Home Medications   Prior to Admission medications   Medication Sig Start Date End Date Taking? Authorizing Provider  colchicine 0.6 MG tablet Take 0.6 mg by mouth at bedtime.   Yes Historical Provider, MD   cyanocobalamin (,VITAMIN B-12,) 1000 MCG/ML injection 1000 mcg [place] daily for 7 days from 06/05/15, then, 1000 mcg [place] weekly for 1 month, then, 1000 mcg [place] monthly. Patient taking differently: Inject 1,000 mcg into the skin every 30 (thirty) days. On the 18th of each Month 06/05/15  Yes Shanker Kristeen Mans, MD  Darbepoetin Alfa (ARANESP, ALBUMIN FREE,) 500 MCG/ML SOSY injection Inject 1 mL (500 mcg total) into the skin every 21 ( twenty-one) days. 06/05/15  Yes Shanker Kristeen Mans, MD  diltiazem (CARDIZEM CD) 240 MG 24 hr capsule Take 1 capsule (240 mg total) by mouth daily. 09/26/14  Yes Leonie Man, MD  dronabinol (MARINOL) 2.5 MG capsule Take 1 capsule (2.5 mg total) by mouth daily before lunch. 06/11/15 07/10/15 Yes Gerlene Fee, NP  folic acid (FOLVITE) 1 MG tablet Take 2 tablets (2 mg total) by mouth daily. 06/05/15  Yes Shanker Kristeen Mans, MD  furosemide (LASIX) 40 MG tablet Take 1 tablet (40 mg total) by mouth daily. 09/26/14  Yes Leonie Man, MD  HYDROcodone-acetaminophen [facility]) 10-325 MG tablet Take 1 tablet by mouth every 4 (four) hours.   Yes Historical Provider, MD  lactulose (CHRONULAC) 10 GM/15ML solution Take 30 mLs (20 g total) by mouth daily. 06/05/15  Yes Shanker Kristeen Mans, MD  metaxalone (SKELAXIN) 800 MG tablet Take 1 tablet (800 mg total) by mouth 3 (three) times daily. 06/10/15  Yes Gerlene Fee, NP  mirtazapine (REMERON) 15 MG tablet Take 1 tablet (15 mg total) by mouth at bedtime. 04/10/15  Yes Spencer Copland, MD  potassium chloride 20 MEQ TBCR Take 8 mEq by mouth daily. Patient taking differently: Take 20 mEq by mouth daily.  06/05/15  Yes Shanker Kristeen Mans, MD  thiamine 100 MG tablet Take 1 tablet (100 mg total) by mouth daily. 06/05/15  Yes Shanker Kristeen Mans, MD  feeding supplement, ENSURE ENLIVE, (ENSURE ENLIVE) LIQD Take 237 mLs by mouth 3 (three) times daily between meals. 06/05/15   Shanker Kristeen Mans, MD  ferumoxytol (FERAHEME) 510 MG/17ML SOLN injection Inject  17 mLs (510 mg total) into the vein once a week. 06/05/15   Shanker Kristeen Mans, MD   BP 120/71 mmHg  Pulse 111  Temp(Src) 98.6 F (37 C) (Rectal)  Resp 26  SpO2 94% Physical Exam  Constitutional: He appears well-developed and well-nourished.  HENT:  Head: Normocephalic and atraumatic.  Dry mm  Eyes: Right eye exhibits no discharge. Left eye exhibits no discharge.  Neck: Normal range of motion. Neck supple. No tracheal deviation present.  Cardiovascular: Regular rhythm.  Tachycardia present.   Pulmonary/Chest: Effort normal and breath sounds normal.  Abdominal: Soft. He exhibits no distension. There is no tenderness. There is no guarding.  Genitourinary: Penile tenderness: Foley catheter in place.  Musculoskeletal: He exhibits tenderness. He exhibits no edema.  Patient has tenderness with minimal range of motion of hips worse on the right compartment soft and lower extremities no wounds or signs infection  to the feet.  Neurological: He is alert. GCS eye subscore is 4. GCS verbal subscore is 4. GCS motor subscore is 6.  Mild general confusion, difficult neuro exam due to pain in the hips however equal strength in upper extremities, sensation grossly intact bilateral upper lower extremities. Neck supple.  Skin: Skin is warm. No rash noted. There is pallor.  Multiple superficial ulcers sacrum  Psychiatric: He has a normal mood and affect.  Nursing note and vitals reviewed.   ED Course  Procedures (including critical care time) Labs Review Labs Reviewed  BASIC METABOLIC PANEL - Abnormal; Notable for the following:    Glucose, Bld 106 (*)    Calcium 8.3 (*)    GFR calc non Af Amer 59 (*)    All other components within normal limits  CBC WITH DIFFERENTIAL/PLATELET - Abnormal; Notable for the following:    WBC 23.3 (*)    RBC 2.67 (*)    Hemoglobin 7.5 (*)    HCT 22.8 (*)    RDW 18.5 (*)    Platelets 836 (*)    Neutro Abs 18.3 (*)    Monocytes Absolute 3.0 (*)    All other  components within normal limits  URINALYSIS, ROUTINE W REFLEX MICROSCOPIC (NOT AT [facility]) - Abnormal; Notable for the following:    APPearance CLOUDY (*)    Leukocytes, UA MODERATE (*)    All other components within normal limits  URINE MICROSCOPIC-ADD ON - Abnormal; Notable for the following:    Bacteria, UA MANY (*)    All other components within normal limits  URINE CULTURE  LACTIC ACID, PLASMA    Imaging Review Dg Chest 2 View  06/19/2015  CLINICAL DATA:  Chest pain, shortness of breath and cough for 1 week, hypertension, former smoker, cirrhosis, paroxysmal atrial fibrillation EXAM: CHEST  2 VIEW COMPARISON:  06/01/2015 FINDINGS: Normal heart size, mediastinal contours, and pulmonary vascularity. Minimal atherosclerotic calcification and elongation of thoracic aorta. Lungs appear hyperinflated with chronic elevation of RIGHT diaphragm. No acute infiltrate, pleural effusion or pneumothorax. Probable LEFT nipple shadow, seen external to the costal margin on prior exam. No acute osseous findings. IMPRESSION: Probable COPD changes with chronic elevation of RIGHT diaphragm. No acute abnormalities. Electronically Signed   By: Lavonia Dana M.D.   On: 06/19/2015 16:00   I have personally reviewed and evaluated these images and lab results as part of my medical decision-making.   EKG Interpretation None      MDM   Final diagnoses:  Anemia, chronic disease  Confusion  Complicated UTI (urinary tract infection)   Patient presents from rehabilitation facility with concerns for infection, patient had blood work done showing increased white blood cell count, persistent anemia and had a fever at the rehabilitation facility. Difficulty with IV, blood work pending. Urinalysis infected, antibiotics ordered reviewed allergies. Patient is Jehovah's Witness and refuses any blood products at this time. Plan for admission for general weakness, urine infection. Sacral ulcers visualized, will need close  monitoring and treatment/ patient movement, et Ronney Asters. New since being at [facility] per family.   The patients results and plan were reviewed and discussed.   Any x-rays performed were independently reviewed by myself.   Differential diagnosis were considered with the presenting HPI.  Medications  levofloxacin (LEVAQUIN) IVPB 750 mg (not administered)    Filed Vitals:   06/19/15 1430 06/19/15 1445 06/19/15 1500 06/19/15 1515  BP: 111/46 123/71 115/66 120/71  Pulse: 85  111   Temp:   98.6 F (  37 C)   TempSrc:   Rectal   Resp: '19 24 20 26  '$ SpO2: 98%  94%     Final diagnoses:  Anemia, chronic disease  Confusion  Complicated UTI (urinary tract infection)  Decubitus ulcer of sacral area, unspecified pressure ulcer stage        Elnora Morrison, MD 06/19/15 1753

## 2015-06-20 DIAGNOSIS — L8990 Pressure ulcer of unspecified site, unspecified stage: Secondary | ICD-10-CM

## 2015-06-20 LAB — COMPREHENSIVE METABOLIC PANEL
ALBUMIN: 1.4 g/dL — AB (ref 3.5–5.0)
ALK PHOS: 388 U/L — AB (ref 38–126)
ALT: 12 U/L — AB (ref 17–63)
AST: 19 U/L (ref 15–41)
Anion gap: 5 (ref 5–15)
BUN: 14 mg/dL (ref 6–20)
CALCIUM: 7.9 mg/dL — AB (ref 8.9–10.3)
CO2: 23 mmol/L (ref 22–32)
CREATININE: 1.03 mg/dL (ref 0.61–1.24)
Chloride: 107 mmol/L (ref 101–111)
GFR calc non Af Amer: >60 mL/min (ref >60)
GLUCOSE: 114 mg/dL — AB (ref 65–99)
Potassium: 3.6 mmol/L (ref 3.5–5.1)
SODIUM: 135 mmol/L (ref 135–145)
Total Bilirubin: 0.7 mg/dL (ref 0.3–1.2)
Total Protein: 5.6 g/dL — ABNORMAL LOW (ref 6.5–8.1)

## 2015-06-20 LAB — CBC
HEMATOCRIT: 20.9 % — AB (ref 39.0–52.0)
Hemoglobin: 7 g/dL — ABNORMAL LOW (ref 13.0–17.0)
MCH: 28.7 pg (ref 26.0–34.0)
MCHC: 33.5 g/dL (ref 30.0–36.0)
MCV: 85.7 fL (ref 78.0–100.0)
Platelets: 721 10*3/uL — ABNORMAL HIGH (ref 150–400)
RBC: 2.44 MIL/uL — AB (ref 4.22–5.81)
RDW: 18.2 % — ABNORMAL HIGH (ref 11.5–15.5)
WBC: 18.7 10*3/uL — AB (ref 4.0–10.5)

## 2015-06-20 LAB — URINE CULTURE

## 2015-06-20 LAB — MRSA PCR SCREENING: MRSA BY PCR: NEGATIVE

## 2015-06-20 NOTE — Progress Notes (Signed)
Utilization Review Completed.  

## 2015-06-20 NOTE — Progress Notes (Signed)
Attempted to call report to 5W nurse. Nurse will call 2C to receive report when back on floor.

## 2015-06-20 NOTE — Progress Notes (Signed)
[facility] Progress Note   Bob Wilson  KCM:[id number]  DOB: [birth year]  DOA: 06/19/2015 PCP: Owens Loffler, MD  Brief narrative: Bob Wilson is a 81 y.o. male with oley cath since 7/16, recent right hip fracture that has not been repaired, anemia of chronic disease, HTN, alcoholic cirrhosis, A-fib and alcoholic neuropathy who is sent from the nursing facility for leukocytosis. Admitted for sepsis secondary to a UTI. Recent hip fracture has not been repaired due to anemia. 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 He received Iron infusions at the cancer center. He has been bed bound since his fx.   Subjective: No complaints of fever chills nausea vomiting or pain  Assessment/Plan: Principal Problem:   Sepsis secondary to UTI  -Tachycardia and hypotension resolved-lactic acid normalized -UA reveals multiple species and recollection is suggested -Continue levofloxacin- discontinue vancomycin  Active Problems: A- fib with RVR CHA2DS2-VASc Score 3 -Cardizem resumed and rate is currently controlled - not on anticoagulation (alcoholic, had frequent falls- due to alcoholic neuropathy?) but as he is no longer a fall risk due to being bed bound, can be started on anticoagulation at this point (may be just temporary) - started Eliquis  Right hip fx - bedrest-   Anemia of chronic disease - Iron infusions per hematology- need Hb > 8 in order to have surgery  Thrombocytosis - acute- follow -  Sacral decubitus ulcers - due to being bed bound - wound care eval- air mattress- recommend Air mattress at SNF as he is not able to be re-positioned due to hip fx.   Alcoholic cirrhosis - cont Lactulose  Protein calorie malnutrition  - cont Ensure  Gout - cont Colchicine      Code Status:     Code Status Orders        Start     Ordered   06/19/15 1910  Full code   Continuous     06/19/15 1910     Family  Communication:   Disposition Plan: return to SNF in 1-2 days DVT prophylaxis: Eliquis Consultants: Procedures:   Antibiotics: Anti-infectives    Start     Dose/Rate Route Frequency Ordered Stop   06/20/15 1700  levofloxacin (LEVAQUIN) IVPB 750 mg     750 mg 100 mL/hr over 90 Minutes Intravenous Every 24 hours 06/19/15 1926     06/20/15 0800  vancomycin (VANCOCIN) IVPB 750 mg/150 ml premix     750 mg 150 mL/hr over 60 Minutes Intravenous Every 12 hours 06/19/15 1926     06/19/15 2000  vancomycin (VANCOCIN) 1,500 mg in sodium chloride 0.9 % 500 mL IVPB     1,500 mg 250 mL/hr over 120 Minutes Intravenous  Once 06/19/15 1926 06/20/15 0128   06/19/15 1545  levofloxacin (LEVAQUIN) IVPB 750 mg     750 mg 100 mL/hr over 90 Minutes Intravenous  Once 06/19/15 1543 06/19/15 1828      Objective: Filed Weights   06/19/15 2115  Weight: 67.6 kg (149 lb 0.5 oz)    Intake/Output Summary (Last 24 hours) at 06/20/15 1408 Last data filed at 06/20/15 1000  Gross per 24 hour  Intake 1996.67 ml  Output    700 ml  Net 1296.67 ml     Vitals Filed Vitals:   06/20/15 0342 06/20/15 0757 06/20/15 0800 06/20/15 0918  BP: 104/65 108/66 108/56 110/66  Pulse: 85 106    Temp: 98.3 F (36.8 C) 98.8 F (  37.1 C)    TempSrc: Oral Oral    Resp: 24 17    Height:      Weight:      SpO2: 100% 100%      Exam:  General:  Pt is alert, not in acute distress  HEENT: No icterus, No thrush, oral mucosa moist  Cardiovascular: regular rate and rhythm, S1/S2 No murmur  Respiratory: clear to auscultation bilaterally   Abdomen: Soft, +Bowel sounds, non tender, non distended, no guarding  MSK: No LE edema, cyanosis or clubbing  Data Reviewed: Basic Metabolic Panel:  Recent Labs Lab 06/19/15 1658 06/20/15 0313  NA 135 135  K 4.1 3.6  CL 104 107  CO2 23 23  GLUCOSE 106* 114*  BUN 16 14  CREATININE 1.13 1.03  CALCIUM 8.3* 7.9*   Liver Function Tests:  Recent Labs Lab 06/20/15 0313  AST  19  ALT 12*  ALKPHOS 388*  BILITOT 0.7  PROT 5.6*  ALBUMIN 1.4*   No results for input(s): LIPASE, AMYLASE in the last 168 hours. No results for input(s): AMMONIA in the last 168 hours. CBC:  Recent Labs Lab 06/19/15 1658  WBC 23.3*  NEUTROABS 18.3*  HGB 7.5*  HCT 22.8*  MCV 85.4  PLT 836*   Cardiac Enzymes: No results for input(s): CKTOTAL, CKMB, CKMBINDEX, TROPONINI in the last 168 hours. BNP (last 3 results)  Recent Labs  09/02/14 1526  BNP 431.0*    ProBNP (last 3 results) No results for input(s): PROBNP in the last 8760 hours.  CBG: No results for input(s): GLUCAP in the last 168 hours.  Recent Results (from the past 240 hour(s))  Urine culture     Status: None   Collection Time: 06/19/15  2:55 PM  Result Value Ref Range Status   Specimen Description URINE, RANDOM  Final   Special Requests NONE  Final   Culture MULTIPLE SPECIES PRESENT, SUGGEST RECOLLECTION  Final   Report Status 06/20/2015 FINAL  Final  MRSA PCR Screening     Status: None   Collection Time: 06/19/15 10:43 PM  Result Value Ref Range Status   MRSA by PCR NEGATIVE NEGATIVE Final    Comment:        The GeneXpert MRSA Assay (FDA approved for NASAL specimens only), is one component of a comprehensive MRSA colonization surveillance program. It is not intended to diagnose MRSA infection nor to guide or monitor treatment for MRSA infections.      Studies: Dg Chest 2 View  06/19/2015  CLINICAL DATA:  Chest pain, shortness of breath and cough for 1 week, hypertension, former smoker, cirrhosis, paroxysmal atrial fibrillation EXAM: CHEST  2 VIEW COMPARISON:  06/01/2015 FINDINGS: Normal heart size, mediastinal contours, and pulmonary vascularity. Minimal atherosclerotic calcification and elongation of thoracic aorta. Lungs appear hyperinflated with chronic elevation of RIGHT diaphragm. No acute infiltrate, pleural effusion or pneumothorax. Probable LEFT nipple shadow, seen external to the  costal margin on prior exam. No acute osseous findings. IMPRESSION: Probable COPD changes with chronic elevation of RIGHT diaphragm. No acute abnormalities. Electronically Signed   By: Lavonia Dana M.D.   On: 06/19/2015 16:00    Scheduled Meds:  Scheduled Meds: . apixaban  5 mg Oral BID  . colchicine  0.6 mg Oral QHS  . diltiazem  240 mg Oral Daily  . dronabinol  2.5 mg Oral QAC lunch  . feeding supplement (ENSURE ENLIVE)  237 mL Oral TID BM  . folic acid  2 mg Oral Daily  .  HYDROcodone-acetaminophen  1 tablet Oral Q4H  . lactulose  20 g Oral Daily  . levofloxacin (LEVAQUIN) IV  750 mg Intravenous Q24H  . metaxalone  800 mg Oral TID  . mirtazapine  15 mg Oral QHS  . thiamine  100 mg Oral Daily  . vancomycin  750 mg Intravenous Q12H   Continuous Infusions: . sodium chloride 100 mL/hr at 06/20/15 1404    Time spent on care of this patient: 35 min   [place], MD 06/20/2015, 2:08 PM  LOS: 1 day   [facility] Office  [phone] Pager - Text Page per [email or web address] If 7PM-7AM, please contact night-coverage [email or web address]

## 2015-06-20 NOTE — Progress Notes (Signed)
Bob Wilson is a 81 y.o. male patient who transferred  from 2C13 awake, alert  & orientated  X 3, Full Code, VSS - Blood pressure 108/67, pulse 80, temperature 98.7 F (37.1 C), temperature source Oral, resp. rate 20, height '6\' 4"'$  (1.93 m), weight 67.359 kg (148 lb 8 oz), SpO2 97 %., O2  R/A, no c/o shortness of breath, no c/o chest pain, no distress noted. Tele # [id number] placed and pt is currently running:atrial fibrillation, with ventricular rate of 109.   IV site WDL: forearm left, condition patent with a transparent dsg that's clean dry and intact.  Allergies:   Allergies  Allergen Reactions  . Other     NO BLOOD PRODUCTS(see blood/blood product refusal consent) pt in agreement with albumin   . Ace Inhibitors Other (See Comments)    REACTION: Angioedema  . Aspirin Other (See Comments)    Nose bleeds   . Lisinopril Swelling and Other (See Comments)    REACTION: Facial Swelling  . Rocephin [Ceftriaxone Sodium In Dextrose] Hives    Previously tolerated PCN and Ceph 2/14 Hives with ceftriaxone dose 2/15 No reaction with benadryl/ceftriaxone 2/16 Tolerating Zosyn w/o reaction     Past Medical History  Diagnosis Date  . Anemia     NOS  . Arthritis   . Gout   . Hyperlipidemia   . Hypertension   . GERD (gastroesophageal reflux disease)   . Elevated PSA     multiple times, refused urology eval [facility] notes)  . ETOH abuse   . Medically noncompliant     noncompliant with follow ups, labs.  . Alcoholism ([place]) 06/25/2011  . Cirrhosis, alcoholic ([place]) 2/54/2706  . 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 Paroxysmal atrial fibrillation (HCC)   . Bilateral lower extremity edema     Chronic.  Marland Kitchen History of empyema of pleura 12/2011    s/p VATS decortication  . Dysrhythmia     hx of PAF  . Foley catheter in place   . Falls frequently   . 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 Chronic indwelling Foley catheter 04/12/2015    Pt orientation to unit, room and routine. SR up x 4, pt. On  low air loss bed, fall risk assessment complete with Patient and family verbalizing understanding of risks associated with falls. Pt verbalizes an understanding of how to use the call bell and to call for help before getting out of bed.  Skin, clean-dry-  with stage 2 to mid back with foam dsg and stage 2 to sacrum, surrrounding by MASD wounds, left open to air and barrier cream #2 applied.     Will cont to monitor and assist as needed.  Markey, [place], RN 06/20/2015 5:29 PM

## 2015-06-20 NOTE — Consult Note (Signed)
WOC wound consult note Reason for Consult: Consult requested for sacrum/buttocks.  Pt is frequently incontinent of loose stools and it is difficult to keep wound from becoming soiled. Wound type: Moisture associated skin damage across sacrum and bilat buttocks, affected area is approx 12X12cm with patchy areas of partial thickness skin loss, pink and moist. Sacrum with stage 2 pressure injury; .5X.5X.1cm, pink and moist, no odor, small amt yellow drainage. Middle back with stage 2 pressure injury; .3X.3X.1cm, pink and dry, spinal bone protrudes over the sacral and middle back, making it difficult to reduce pressure. Pressure Ulcer POA: Yes Dressing procedure/placement/frequency: Foam dressings to protect affected areas and promote healing.  Barrier cream to buttocks to repel moisture and protect skin. Please re-consult if further assistance is needed.  Thank-you,  Julien Girt MSN, [place], [place], [place], [place]

## 2015-06-20 NOTE — Discharge Instructions (Signed)

## 2015-06-21 ENCOUNTER — Inpatient Hospital Stay (HOSPITAL_COMMUNITY): Payer: Medicare Other

## 2015-06-21 DIAGNOSIS — D638 Anemia in other chronic diseases classified elsewhere: Secondary | ICD-10-CM

## 2015-06-21 LAB — CBC
HCT: 20.2 % — ABNORMAL LOW (ref 39.0–52.0)
HEMATOCRIT: 19.4 % — AB (ref 39.0–52.0)
HEMOGLOBIN: 6.8 g/dL — AB (ref 13.0–17.0)
Hemoglobin: 6.7 g/dL — CL (ref 13.0–17.0)
MCH: 28.8 pg (ref 26.0–34.0)
MCH: 29.6 pg (ref 26.0–34.0)
MCHC: 33.7 g/dL (ref 30.0–36.0)
MCHC: 34.5 g/dL (ref 30.0–36.0)
MCV: 85.6 fL (ref 78.0–100.0)
MCV: 85.8 fL (ref 78.0–100.0)
PLATELETS: 653 10*3/uL — AB (ref 150–400)
Platelets: 675 10*3/uL — ABNORMAL HIGH (ref 150–400)
RBC: 2.36 MIL/uL — AB (ref 4.22–5.81)
RBC: 2.26 MIL/uL — AB (ref 4.22–5.81)
RDW: 18.5 % — ABNORMAL HIGH (ref 11.5–15.5)
RDW: 18.5 % — ABNORMAL HIGH (ref 11.5–15.5)
WBC: 19.2 10*3/uL — ABNORMAL HIGH (ref 4.0–10.5)
WBC: 18.6 10*3/uL — AB (ref 4.0–10.5)

## 2015-06-21 LAB — BASIC METABOLIC PANEL
Anion gap: 7 (ref 5–15)
BUN: 12 mg/dL (ref 6–20)
CALCIUM: 8.1 mg/dL — AB (ref 8.9–10.3)
CHLORIDE: 108 mmol/L (ref 101–111)
CO2: 20 mmol/L — AB (ref 22–32)
CREATININE: 0.97 mg/dL (ref 0.61–1.24)
GFR calc non Af Amer: >60 mL/min (ref >60)
Glucose, Bld: 97 mg/dL (ref 65–99)
Potassium: 3.4 mmol/L — ABNORMAL LOW (ref 3.5–5.1)
Sodium: 135 mmol/L (ref 135–145)

## 2015-06-21 LAB — RETICULOCYTES
RBC.: 2.26 MIL/uL — ABNORMAL LOW (ref 4.22–5.81)
Retic Count, Absolute: 76.8 10*3/uL (ref 19.0–186.0)
Retic Ct Pct: 3.4 % — ABNORMAL HIGH (ref 0.4–3.1)

## 2015-06-21 NOTE — Clinical Social Work Note (Signed)
Patient has transferred from 2c13 to 5w26, patient is from [facility].  Patient and family agreeable to returning, handoff given to unit CSW, this CSW to sign off.  Jones Broom. Elfrieda Espino, MSW, [place] 06/21/2015 11:15 AM

## 2015-06-21 NOTE — Care Management Note (Signed)
Case Management Note  Patient Details  Name: Bob Wilson MRN: [id number] Date of Birth: 03/21/1933  Subjective/Objective:   Patient is from [facility], and the plan is for patient to return back there.                  Action/Plan:   Expected Discharge Date:                  Expected Discharge Plan:  Skilled Nursing Facility  In-House Referral:  Clinical Social Work  Discharge planning Services  CM Consult  Post Acute Care Choice:    Choice offered to:     DME Arranged:    DME Agency:     HH Arranged:    [place] Agency:     Status of Service:  Completed, signed off  Medicare Important Message Given:    Date Medicare IM Given:    Medicare IM give by:    Date Additional Medicare IM Given:    Additional Medicare Important Message give by:     If discussed at [place] of Stay Meetings, dates discussed:    Additional Comments:  Zenon Mayo, RN 06/21/2015, 3:03 PM

## 2015-06-21 NOTE — Progress Notes (Signed)
[facility] Progress Note   Bob Wilson  AST:[id number]  DOB: [birth year]  DOA: 06/19/2015 PCP: Owens Loffler, MD  Brief narrative: Bob Wilson is a 81 y.o. male with oley cath since 7/16, recent right hip fracture that has not been repaired, anemia of chronic disease, HTN, alcoholic cirrhosis, A-fib and alcoholic neuropathy who is sent from the nursing facility for leukocytosis. Admitted for sepsis secondary to a UTI. Recent hip fracture has not been repaired due to anemia. 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 He received Iron infusions at the cancer center. He has been bed bound since his fx.   Subjective: No complaints today.   Assessment/Plan: Principal Problem:   Sepsis secondary to UTI  -Tachycardia and hypotension resolved-lactic acid normalized -UA reveals multiple species and recollection is suggested -Continue levofloxacin- discontinue vancomycin - leukocytosis persisting despite antibiotics and despite resolution of other signs of sepsis-  Stress response?  - follow   Active Problems: A- fib with RVR - CHA2DS2-VASc Score 3 -Cardizem resumed and rate is currently controlled - not on anticoagulation (alcoholic, had frequent falls- due to alcoholic neuropathy?) but as he is no longer a fall risk due to being bed bound, can be started on anticoagulation at this point (may be just temporary) - started Eliquis  Right hip fx - bedrest-   Anemia of chronic disease - Iron infusions per hematology- need Hb > 8 in order to have surgery - drop in Hb noted today- will check CT of the hip to r/o bleeding   Thrombocytosis - acute- follow -  Sacral decubitus ulcers - due to being bed bound - wound care eval- air mattress- recommend Air mattress at SNF as he is not able to be re-positioned due to hip fx.   Alcoholic cirrhosis - cont Lactulose  Protein calorie malnutrition  - cont Ensure  Gout - cont  Colchicine      Code Status:     Code Status Orders        Start     Ordered   06/19/15 1910  Full code   Continuous     06/19/15 1910     Family Communication:   Disposition Plan: return to SNF in 1-2 days DVT prophylaxis: Eliquis Consultants: Procedures:   Antibiotics: Anti-infectives    Start     Dose/Rate Route Frequency Ordered Stop   06/20/15 1700  levofloxacin (LEVAQUIN) IVPB 750 mg     750 mg 100 mL/hr over 90 Minutes Intravenous Every 24 hours 06/19/15 1926     06/20/15 0800  vancomycin (VANCOCIN) IVPB 750 mg/150 ml premix     750 mg 150 mL/hr over 60 Minutes Intravenous Every 12 hours 06/19/15 1926     06/19/15 2000  vancomycin (VANCOCIN) 1,500 mg in sodium chloride 0.9 % 500 mL IVPB     1,500 mg 250 mL/hr over 120 Minutes Intravenous  Once 06/19/15 1926 06/20/15 0128   06/19/15 1545  levofloxacin (LEVAQUIN) IVPB 750 mg     750 mg 100 mL/hr over 90 Minutes Intravenous  Once 06/19/15 1543 06/19/15 1828      Objective: Filed Weights   06/19/15 2115 06/20/15 1651  Weight: 67.6 kg (149 lb 0.5 oz) 67.359 kg (148 lb 8 oz)    Intake/Output Summary (Last 24 hours) at 06/21/15 1229 Last data filed at 06/21/15 0900  Gross per 24 hour  Intake    675 ml  Output  900 ml  Net   -225 ml     Vitals Filed Vitals:   06/20/15 1651 06/20/15 2113 06/20/15 2230 06/21/15 0509  BP: 108/67 110/66  110/52  Pulse: 80 120 84 78  Temp: 98.7 F (37.1 C) 97.7 F (36.5 C)  98 F (36.7 C)  TempSrc: Oral Oral  Oral  Resp: '20 18  18  '$ Height: '6\' 4"'$  (1.93 m)     Weight: 67.359 kg (148 lb 8 oz)     SpO2: 97% 100%  100%    Exam:  General:  Pt is alert, not in acute distress  HEENT: No icterus, No thrush, oral mucosa moist  Cardiovascular: regular rate and rhythm, S1/S2 No murmur  Respiratory: clear to auscultation bilaterally   Abdomen: Soft, +Bowel sounds, non tender, non distended, no guarding  MSK: No LE edema, cyanosis or clubbing  Data Reviewed: Basic  Metabolic Panel:  Recent Labs Lab 06/19/15 1658 06/20/15 0313 06/21/15 0628  NA 135 135 135  K 4.1 3.6 3.4*  CL 104 107 108  CO2 23 23 20*  GLUCOSE 106* 114* 97  BUN '16 14 12  '$ CREATININE 1.13 1.03 0.97  CALCIUM 8.3* 7.9* 8.1*   Liver Function Tests:  Recent Labs Lab 06/20/15 0313  AST 19  ALT 12*  ALKPHOS 388*  BILITOT 0.7  PROT 5.6*  ALBUMIN 1.4*   No results for input(s): LIPASE, AMYLASE in the last 168 hours. No results for input(s): AMMONIA in the last 168 hours. CBC:  Recent Labs Lab 06/19/15 1658 06/20/15 1510 06/21/15 0628  WBC 23.3* 18.7* 19.2*  NEUTROABS 18.3*  --   --   HGB 7.5* 7.0* 6.8*  HCT 22.8* 20.9* 20.2*  MCV 85.4 85.7 85.6  PLT 836* 721* 653*   Cardiac Enzymes: No results for input(s): CKTOTAL, CKMB, CKMBINDEX, TROPONINI in the last 168 hours. BNP (last 3 results)  Recent Labs  09/02/14 1526  BNP 431.0*    ProBNP (last 3 results) No results for input(s): PROBNP in the last 8760 hours.  CBG: No results for input(s): GLUCAP in the last 168 hours.  Recent Results (from the past 240 hour(s))  Urine culture     Status: None   Collection Time: 06/19/15  2:55 PM  Result Value Ref Range Status   Specimen Description URINE, RANDOM  Final   Special Requests NONE  Final   Culture MULTIPLE SPECIES PRESENT, SUGGEST RECOLLECTION  Final   Report Status 06/20/2015 FINAL  Final  MRSA PCR Screening     Status: None   Collection Time: 06/19/15 10:43 PM  Result Value Ref Range Status   MRSA by PCR NEGATIVE NEGATIVE Final    Comment:        The GeneXpert MRSA Assay (FDA approved for NASAL specimens only), is one component of a comprehensive MRSA colonization surveillance program. It is not intended to diagnose MRSA infection nor to guide or monitor treatment for MRSA infections.      Studies: Dg Chest 2 View  06/19/2015  CLINICAL DATA:  Chest pain, shortness of breath and cough for 1 week, hypertension, former smoker, cirrhosis,  paroxysmal atrial fibrillation EXAM: CHEST  2 VIEW COMPARISON:  06/01/2015 FINDINGS: Normal heart size, mediastinal contours, and pulmonary vascularity. Minimal atherosclerotic calcification and elongation of thoracic aorta. Lungs appear hyperinflated with chronic elevation of RIGHT diaphragm. No acute infiltrate, pleural effusion or pneumothorax. Probable LEFT nipple shadow, seen external to the costal margin on prior exam. No acute osseous findings. IMPRESSION: Probable COPD changes  with chronic elevation of RIGHT diaphragm. No acute abnormalities. Electronically Signed   By: Lavonia Dana M.D.   On: 06/19/2015 16:00    Scheduled Meds:  Scheduled Meds: . apixaban  5 mg Oral BID  . colchicine  0.6 mg Oral QHS  . diltiazem  240 mg Oral Daily  . dronabinol  2.5 mg Oral QAC lunch  . feeding supplement (ENSURE ENLIVE)  237 mL Oral TID BM  . folic acid  2 mg Oral Daily  . HYDROcodone-acetaminophen  1 tablet Oral Q4H  . lactulose  20 g Oral Daily  . levofloxacin (LEVAQUIN) IV  750 mg Intravenous Q24H  . metaxalone  800 mg Oral TID  . mirtazapine  15 mg Oral QHS  . thiamine  100 mg Oral Daily  . vancomycin  750 mg Intravenous Q12H   Continuous Infusions: . sodium chloride 100 mL/hr at 06/21/15 0118    Time spent on care of this patient: 35 min   [place], MD 06/21/2015, 12:29 PM  LOS: 2 days   [facility] Office  [phone] Pager - Text Page per [email or web address] If 7PM-7AM, please contact night-coverage [email or web address]

## 2015-06-21 NOTE — Progress Notes (Signed)
CRITICAL VALUE ALERT  Critical value received:  Hemoglobin 6.8  Date of notification:  06/21/2015  Time of notification:  7:20   Critical value read back:Yes.    Nurse who received alert:  [place] [employer] officer  MD notified (1st page): Rizwan   Time of first page:  [place] aware.

## 2015-06-22 LAB — BASIC METABOLIC PANEL
ANION GAP: 6 (ref 5–15)
BUN: 13 mg/dL (ref 6–20)
CALCIUM: 8.1 mg/dL — AB (ref 8.9–10.3)
CO2: 20 mmol/L — ABNORMAL LOW (ref 22–32)
Chloride: 111 mmol/L (ref 101–111)
Creatinine, Ser: 1 mg/dL (ref 0.61–1.24)
Glucose, Bld: 90 mg/dL (ref 65–99)
POTASSIUM: 4.1 mmol/L (ref 3.5–5.1)
SODIUM: 137 mmol/L (ref 135–145)

## 2015-06-22 LAB — CBC
HEMATOCRIT: 18.3 % — AB (ref 39.0–52.0)
HEMOGLOBIN: 6.1 g/dL — AB (ref 13.0–17.0)
MCH: 28.6 pg (ref 26.0–34.0)
MCHC: 33.3 g/dL (ref 30.0–36.0)
MCV: 85.9 fL (ref 78.0–100.0)
Platelets: 624 10*3/uL — ABNORMAL HIGH (ref 150–400)
RBC: 2.13 MIL/uL — ABNORMAL LOW (ref 4.22–5.81)
RDW: 18.4 % — AB (ref 11.5–15.5)
WBC: 17.1 10*3/uL — AB (ref 4.0–10.5)

## 2015-06-22 LAB — DIRECT ANTIGLOBULIN TEST (NOT AT ARMC)
DAT, IGG: NEGATIVE
DAT, complement: NEGATIVE

## 2015-06-22 LAB — LACTATE DEHYDROGENASE: LDH: 146 U/L (ref 98–192)

## 2015-06-22 LAB — ERYTHROPOIETIN: ERYTHROPOIETIN: 30.3 m[IU]/mL — AB (ref 2.6–18.5)

## 2015-06-22 LAB — TSH: TSH: 2.615 u[IU]/mL (ref 0.350–4.500)

## 2015-06-22 MED ORDER — FOLIC ACID 1 MG PO TABS
3.0000 mg | ORAL_TABLET | Freq: Every day | ORAL | Status: DC
  Administered 2015-06-22 – 2015-06-25 (×4): 3 mg via ORAL
  Filled 2015-06-22 (×5): qty 3

## 2015-06-22 MED ORDER — CYANOCOBALAMIN 1000 MCG/ML IJ SOLN
1000.0000 ug | Freq: Every day | INTRAMUSCULAR | Status: DC
Start: 2015-06-22 — End: 2015-06-25
  Administered 2015-06-22 – 2015-06-24 (×3): 1000 ug via SUBCUTANEOUS
  Filled 2015-06-22 (×4): qty 1

## 2015-06-22 MED ORDER — DARBEPOETIN ALFA 100 MCG/0.5ML IJ SOSY
500.0000 ug | PREFILLED_SYRINGE | Freq: Once | INTRAMUSCULAR | Status: DC
  Filled 2015-06-22: qty 2.5

## 2015-06-22 MED ORDER — DARBEPOETIN ALFA 100 MCG/0.5ML IJ SOSY
500.0000 ug | PREFILLED_SYRINGE | Freq: Once | INTRAMUSCULAR | Status: AC
  Administered 2015-06-22: 500 ug via SUBCUTANEOUS
  Filled 2015-06-22: qty 2.5

## 2015-06-22 MED ORDER — SODIUM CHLORIDE 0.9 % IV SOLN
510.0000 mg | Freq: Once | INTRAVENOUS | Status: AC
  Administered 2015-06-22: 510 mg via INTRAVENOUS
  Filled 2015-06-22: qty 17

## 2015-06-22 NOTE — Progress Notes (Signed)
[facility] Progress Note   Bob Wilson  VPX:[id number]  DOB: 1933-07-05  DOA: 06/19/2015 PCP: Owens Loffler, MD  Brief narrative: Bob Wilson is a 81 y.o. male with foley cath since 7/16, recent right hip fracture that has not been repaired, anemia of chronic disease, HTN, alcoholic cirrhosis, A-fib and alcoholic neuropathy who is sent from the nursing facility for leukocytosis. Admitted for sepsis secondary to a UTI. Recent hip fracture has not been repaired due to anemia. 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 He received Iron infusions at the cancer center. He has been bed bound since his fx.   Subjective: No nausea, vomiting, diarrhea or pain.   Assessment/Plan: Principal Problem:   Sepsis secondary to UTI  -Tachycardia and hypotension resolved-lactic acid normalized -UA reveals multiple species and recollection is suggested -Continue levofloxacin- discontinue vancomycin - leukocytosis persisting despite antibiotics and despite resolution of other signs of sepsis-  Stress response?  - follow   Active Problems: A- fib with RVR - CHA2DS2-VASc Score 3 -Cardizem resumed and rate is currently controlled - not on anticoagulation (alcoholic, had frequent falls- due to alcoholic neuropathy?) but as he is no longer a fall risk due to being bed bound, can be started on anticoagulation at this point (may be just temporary) - started Eliquis  Right hip fx - occurring on 06/01/15 - bedrest- hip will not be repaired until Hb > 8  Anemia  - needs Hb > 8 in order to have surgery - drop in Hb noted likely dilutional-   CT of the hip neg for bleeding  - folate and B12 deficiency noted as well- replacing - will give a dose of Feraheme and daily Aranesp - discussed with hematology- Dr Lindi Adie will evaluate him tomorrow  Thrombocytosis - acute- follow -  Sacral decubitus ulcers - due to being bed bound - wound care eval-  air mattress- recommend Air mattress at SNF as he is not able to be re-positioned due to hip fx.   Alcoholic cirrhosis - cont Lactulose  Protein calorie malnutrition  - cont Ensure  Gout - cont Colchicine      Code Status:     Code Status Orders        Start     Ordered   06/19/15 1910  Full code   Continuous     06/19/15 1910     Family Communication:   Disposition Plan: return to SNF in 1-2 days DVT prophylaxis: Eliquis Consultants: Procedures:   Antibiotics: Anti-infectives    Start     Dose/Rate Route Frequency Ordered Stop   06/20/15 1700  levofloxacin (LEVAQUIN) IVPB 750 mg     750 mg 100 mL/hr over 90 Minutes Intravenous Every 24 hours 06/19/15 1926     06/20/15 0800  vancomycin (VANCOCIN) IVPB 750 mg/150 ml premix  Status:  Discontinued     750 mg 150 mL/hr over 60 Minutes Intravenous Every 12 hours 06/19/15 1926 06/21/15 1230   06/19/15 2000  vancomycin (VANCOCIN) 1,500 mg in sodium chloride 0.9 % 500 mL IVPB     1,500 mg 250 mL/hr over 120 Minutes Intravenous  Once 06/19/15 1926 06/20/15 0128   06/19/15 1545  levofloxacin (LEVAQUIN) IVPB 750 mg     750 mg 100 mL/hr over 90 Minutes Intravenous  Once 06/19/15 1543 06/19/15 1828      Objective: Filed Weights   06/19/15 2115 06/20/15 1651  Weight: 67.6 kg (149  lb 0.5 oz) 67.359 kg (148 lb 8 oz)    Intake/Output Summary (Last 24 hours) at 06/22/15 1540 Last data filed at 06/22/15 0946  Gross per 24 hour  Intake   4070 ml  Output    300 ml  Net   3770 ml     Vitals Filed Vitals:   06/21/15 2201 06/22/15 0557 06/22/15 0913 06/22/15 1341  BP: 85/43 99/71 113/68 107/67  Pulse: 43 126 124 142  Temp: 98.1 F (36.7 C) 97.9 F (36.6 C)  98.5 F (36.9 C)  TempSrc: Oral Oral  Oral  Resp: '16 19  20  '$ Height:      Weight:      SpO2: 100% 100%  100%    Exam:  General:  Pt is alert, not in acute distress  HEENT: No icterus, No thrush, oral mucosa moist  Cardiovascular: regular rate and  rhythm, S1/S2 No murmur  Respiratory: clear to auscultation bilaterally   Abdomen: Soft, +Bowel sounds, non tender, non distended, no guarding  MSK: No LE edema, cyanosis or clubbing  Data Reviewed: Basic Metabolic Panel:  Recent Labs Lab 06/19/15 1658 06/20/15 0313 06/21/15 0628 06/22/15 0520  NA 135 135 135 137  K 4.1 3.6 3.4* 4.1  CL 104 107 108 111  CO2 23 23 20* 20*  GLUCOSE 106* 114* 97 90  BUN '16 14 12 13  '$ CREATININE 1.13 1.03 0.97 1.00  CALCIUM 8.3* 7.9* 8.1* 8.1*   Liver Function Tests:  Recent Labs Lab 06/20/15 0313  AST 19  ALT 12*  ALKPHOS 388*  BILITOT 0.7  PROT 5.6*  ALBUMIN 1.4*   No results for input(s): LIPASE, AMYLASE in the last 168 hours. No results for input(s): AMMONIA in the last 168 hours. CBC:  Recent Labs Lab 06/19/15 1658 06/20/15 1510 06/21/15 0628 06/21/15 1444 06/22/15 0520  WBC 23.3* 18.7* 19.2* 18.6* 17.1*  NEUTROABS 18.3*  --   --   --   --   HGB 7.5* 7.0* 6.8* 6.7* 6.1*  HCT 22.8* 20.9* 20.2* 19.4* 18.3*  MCV 85.4 85.7 85.6 85.8 85.9  PLT 836* 721* 653* 675* 624*   Cardiac Enzymes: No results for input(s): CKTOTAL, CKMB, CKMBINDEX, TROPONINI in the last 168 hours. BNP (last 3 results)  Recent Labs  09/02/14 1526  BNP 431.0*    ProBNP (last 3 results) No results for input(s): PROBNP in the last 8760 hours.  CBG: No results for input(s): GLUCAP in the last 168 hours.  Recent Results (from the past 240 hour(s))  Urine culture     Status: None   Collection Time: 06/19/15  2:55 PM  Result Value Ref Range Status   Specimen Description URINE, RANDOM  Final   Special Requests NONE  Final   Culture MULTIPLE SPECIES PRESENT, SUGGEST RECOLLECTION  Final   Report Status 06/20/2015 FINAL  Final  MRSA PCR Screening     Status: None   Collection Time: 06/19/15 10:43 PM  Result Value Ref Range Status   MRSA by PCR NEGATIVE NEGATIVE Final    Comment:        The GeneXpert MRSA Assay (FDA approved for NASAL  specimens only), is one component of a comprehensive MRSA colonization surveillance program. It is not intended to diagnose MRSA infection nor to guide or monitor treatment for MRSA infections.      Studies: Ct Hip Right Wo Contrast  06/21/2015  CLINICAL DATA:  Status post subcapital right hip fracture 06/01/2015. The fracture has not been repaired secondary  to the patient's anemia. Increasing right hip pain and decreasing hemoglobin. Question hemorrhage. Subsequent encounter. EXAM: CT OF THE RIGHT HIP WITHOUT CONTRAST TECHNIQUE: Multidetector CT imaging of the right hip was performed according to the standard protocol. Multiplanar CT image reconstructions were also generated. COMPARISON:  Plain films the right hip 06/01/2015. FINDINGS: As seen comparison plain films, the patient has a subcapital right hip fracture. The distal fragment demonstrates mild anterior displacement and anterior angulation of approximately 15 degrees. The femur also shows superior displacement of approximately 1.6 cm. The appearance is not grossly changed compared to the prior plain films. No new fracture is identified. The femoral head is located. There is some subcutaneous edema present about the right buttock and hip. Small to moderate right hip joint effusion associated with the patient's fracture is noted. A moderate to moderately large volume of simple appearing fluid is seen in the right trochanteric bursa. No hematoma is identified. Imaged musculature is intact. Imaged intrapelvic contents show a Foley catheter in place. Iliac atherosclerosis is noted. IMPRESSION: Negative for hematoma. Moderately large volume of simple appearing fluid in the right trochanteric bursa. Subcapital right hip fracture does not appear markedly changed compared to the prior plain films. Electronically Signed   By: Inge Rise M.D.   On: 06/21/2015 14:27    Scheduled Meds:  Scheduled Meds: . apixaban  5 mg Oral BID  . colchicine   0.6 mg Oral QHS  . cyanocobalamin  1,000 mcg Subcutaneous Daily  . darbepoetin (ARANESP) injection - NON-DIALYSIS  500 mcg Subcutaneous Once  . diltiazem  240 mg Oral Daily  . dronabinol  2.5 mg Oral QAC lunch  . feeding supplement (ENSURE ENLIVE)  237 mL Oral TID BM  . folic acid  3 mg Oral Daily  . HYDROcodone-acetaminophen  1 tablet Oral Q4H  . lactulose  20 g Oral Daily  . levofloxacin (LEVAQUIN) IV  750 mg Intravenous Q24H  . metaxalone  800 mg Oral TID  . mirtazapine  15 mg Oral QHS  . thiamine  100 mg Oral Daily   Continuous Infusions:    Time spent on care of this patient: 35 min   [place], MD 06/22/2015, 3:40 PM  LOS: 3 days   [facility] Office  [phone] Pager - Text Page per [email or web address] If 7PM-7AM, please contact night-coverage [email or web address]

## 2015-06-22 NOTE — Care Management Important Message (Signed)
Important Message  Patient Details  Name: Bob Wilson MRN: [id number] Date of Birth: 1933-05-30   Medicare Important Message Given:  Yes    Nathen May 06/22/2015, 12:52 PM

## 2015-06-23 DIAGNOSIS — D72829 Elevated white blood cell count, unspecified: Secondary | ICD-10-CM

## 2015-06-23 DIAGNOSIS — D6959 Other secondary thrombocytopenia: Secondary | ICD-10-CM

## 2015-06-23 DIAGNOSIS — E538 Deficiency of other specified B group vitamins: Secondary | ICD-10-CM

## 2015-06-23 DIAGNOSIS — D519 Vitamin B12 deficiency anemia, unspecified: Secondary | ICD-10-CM

## 2015-06-23 DIAGNOSIS — L8990 Pressure ulcer of unspecified site, unspecified stage: Secondary | ICD-10-CM

## 2015-06-23 DIAGNOSIS — D631 Anemia in chronic kidney disease: Secondary | ICD-10-CM

## 2015-06-23 DIAGNOSIS — L89159 Pressure ulcer of sacral region, unspecified stage: Secondary | ICD-10-CM

## 2015-06-23 LAB — BASIC METABOLIC PANEL
ANION GAP: 7 (ref 5–15)
BUN: 14 mg/dL (ref 6–20)
CALCIUM: 8.2 mg/dL — AB (ref 8.9–10.3)
CO2: 19 mmol/L — ABNORMAL LOW (ref 22–32)
CREATININE: 1.1 mg/dL (ref 0.61–1.24)
Chloride: 113 mmol/L — ABNORMAL HIGH (ref 101–111)
GFR calc non Af Amer: >60 mL/min (ref >60)
Glucose, Bld: 91 mg/dL (ref 65–99)
Potassium: 3.7 mmol/L (ref 3.5–5.1)
Sodium: 139 mmol/L (ref 135–145)

## 2015-06-23 LAB — HAPTOGLOBIN: HAPTOGLOBIN: 327 mg/dL — AB (ref 34–200)

## 2015-06-23 LAB — CBC
HCT: 17.9 % — ABNORMAL LOW (ref 39.0–52.0)
Hemoglobin: 6 g/dL — CL (ref 13.0–17.0)
MCH: 29 pg (ref 26.0–34.0)
MCHC: 33.5 g/dL (ref 30.0–36.0)
MCV: 86.5 fL (ref 78.0–100.0)
PLATELETS: 615 10*3/uL — AB (ref 150–400)
RBC: 2.07 MIL/uL — ABNORMAL LOW (ref 4.22–5.81)
RDW: 18.3 % — AB (ref 11.5–15.5)
WBC: 18.7 10*3/uL — AB (ref 4.0–10.5)

## 2015-06-23 LAB — LACTIC ACID, PLASMA: LACTIC ACID, VENOUS: 1.7 mmol/L (ref 0.5–2.0)

## 2015-06-23 MED ORDER — ENSURE ENLIVE PO LIQD
237.0000 mL | Freq: Three times a day (TID) | ORAL | Status: DC
  Administered 2015-06-23 – 2015-06-25 (×7): 237 mL via ORAL

## 2015-06-23 MED ORDER — FOLIC ACID 1 MG PO TABS: 3.0000 mg | ORAL_TABLET | Freq: Every day | ORAL | Status: DC

## 2015-06-23 NOTE — Consult Note (Signed)
[facility] CONSULT NOTE  Patient Care Team: Owens Loffler, MD as PCP - General Carlyle Basques, MD as Consulting Physician (Infectious Diseases) Deneise Lever, MD as Consulting Physician (Pulmonary Disease)  CHIEF COMPLAINTS/PURPOSE OF CONSULTATION:  Severe anemia, leukocytosis and thrombocytosis, hip fracture, Jehovah's Witness  HISTORY OF PRESENTING ILLNESS:  Bob Wilson 81 y.o. male is admitted to the hospital with a recent history of right femur neck fracture detected on 06/01/2015. His surgery has been postponed because of severe anemia. 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 On initial evaluation on November 12, his iron panel revealed relatively normal iron levels. His folate was low at 4.8. B-12 was low at 218 it appears that he got IV iron infusion in November. At initial presentation had a hemoglobin of 8.6 with an MCV of 80. At that time his white count was 8.9 and platelets were 234 within normal limits. Over the past 3 weeks it appears that he developed leukocytosis primarily neutrophilia along with thrombocytosis. At the same time his hemoglobin has slowly declined to 6 g. He also has chronic kidney disease and history of alcoholism and alcoholic cirrhosis. Blood transfusion is not an option because of his faith being Jehovah's Witness.  MEDICAL HISTORY:  Past Medical History  Diagnosis Date  . Anemia     NOS  . Arthritis   . Gout   . Hyperlipidemia   . Hypertension   . GERD (gastroesophageal reflux disease)   . Elevated PSA     multiple times, refused urology eval [facility] notes)  . ETOH abuse   . Medically noncompliant     noncompliant with follow ups, labs.  . Alcoholism ([place]) 06/25/2011  . Cirrhosis, alcoholic ([place]) 5/32/9924  . 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 Paroxysmal atrial fibrillation (H[facility])   . Bilateral lower extremity edema     Chronic.  Marland Kitchen History of empyema of pleura 12/2011    s/p VATS decortication   . Dysrhythmia     hx of PAF  . Foley catheter in place   . Falls frequently   . 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 Chronic indwelling Foley catheter 04/12/2015    SURGICAL HISTORY: Past Surgical History  Procedure Laterality Date  . Lipoma excision    . Transthoracic echocardiogram  09/03/2014    Normal overall LV size. EF 55-60% with no regional W. MA. Trivial AI. Mild-moderate MR. Mild R. and LA dilation. Mild to moderate TR. PA pressures ~47 mmHg  . Video assisted thoracoscopy (vats)/empyema  12/2011  . Nm myoview ltd  10/12/14    LOW RISK - NO ISCHEMIA, DIAPHRAGMATIC ATTENUATION, NON-GATED  . Green light laser turp (transurethral resection of prostate N/A 12/21/2014    Procedure: GREEN LIGHT LASER TURP (TRANSURETHRAL RESECTION OF PROSTATE;  Surgeon: Ardis Hughs, MD;  Location: [facility];  Service: Urology;  Laterality: N/A;    SOCIAL HISTORY: Social History   Social History  . Marital Status: Married    Spouse Name: N/A  . Number of Children: 2  . Years of Education: N/A   Occupational History  . former Engineer, building services     retired   Social History Main Topics  . Smoking status: Former Smoker -- 0.25 packs/day for 29 years    Types: Cigarettes    Quit date: 07/20/1974  . Smokeless tobacco: Never Used     Comment: Remote- quit 45 years ago.  . Alcohol Use: 0.0 oz/week  0 Standard drinks or equivalent per week     Comment: states that he drinks "couple pints of gin" every day.  nnow has only had maybe one shot of June in the last few weeks.  . Drug Use: No  . Sexual Activity: Not Currently   Other Topics Concern  . Not on file   Social History Narrative   Chronic Alcoholic, severe   Jehovah's Witness         He does not exercise much because of unsteady gait.    He does have a walker and use range of motion at least 2 days a week with physical therapy.      Now usually using a motorized WC      Used to be a Dealer    FAMILY HISTORY: Family History   Problem Relation Age of Onset  . Diabetes Mellitus II Father   . Stroke Mother     ALLERGIES:  is allergic to other; ace inhibitors; aspirin; lisinopril; and rocephin.  MEDICATIONS:  Current Facility-Administered Medications  Medication Dose Route Frequency Provider Last Rate Last Dose  . acetaminophen (TYLENOL) tablet 650 mg  650 mg Oral Q6H PRN Debbe Odea, MD       Or  . acetaminophen (TYLENOL) suppository 650 mg  650 mg Rectal Q6H PRN Debbe Odea, MD      . apixaban (ELIQUIS) tablet 5 mg  5 mg Oral BID Horatio Pel, RPH   5 mg at 06/22/15 2211  . colchicine tablet 0.6 mg  0.6 mg Oral QHS Debbe Odea, MD   0.6 mg at 06/22/15 2210  . cyanocobalamin ((VITAMIN B-12)) injection 1,000 mcg  1,000 mcg Subcutaneous Daily Debbe Odea, MD   1,000 mcg at 06/22/15 1147  . diltiazem (CARDIZEM CD) 24 hr capsule 240 mg  240 mg Oral Daily Debbe Odea, MD   240 mg at 06/22/15 0915  . dronabinol (MARINOL) capsule 2.5 mg  2.5 mg Oral QAC lunch Debbe Odea, MD   2.5 mg at 06/22/15 1146  . feeding supplement (ENSURE ENLIVE) (ENSURE ENLIVE) liquid 237 mL  237 mL Oral TID BM Debbe Odea, MD   237 mL at 06/22/15 2211  . fentaNYL (SUBLIMAZE) injection 50 mcg  50 mcg Intravenous Q2H PRN Elnora Morrison, MD   50 mcg at 06/20/15 1332  . folic acid (FOLVITE) tablet 3 mg  3 mg Oral Daily Debbe Odea, MD   3 mg at 06/22/15 1548  . HYDROcodone-acetaminophen (NORCO) 10-325 MG per tablet 1 tablet  1 tablet Oral Q4H Debbe Odea, MD   1 tablet at 06/23/15 0402  . lactulose (CHRONULAC) 10 GM/15ML solution 20 g  20 g Oral Daily Debbe Odea, MD   20 g at 06/22/15 0909  . levofloxacin (LEVAQUIN) IVPB 750 mg  750 mg Intravenous Q24H Cassie L Stewart, RPH   750 mg at 06/22/15 1742  . metaxalone (SKELAXIN) tablet 800 mg  800 mg Oral TID Debbe Odea, MD   800 mg at 06/22/15 2212  . mirtazapine (REMERON) tablet 15 mg  15 mg Oral QHS Debbe Odea, MD   15 mg at 06/21/15 2210  . ondansetron (ZOFRAN) tablet 4 mg  4 mg  Oral Q6H PRN Debbe Odea, MD       Or  . ondansetron (ZOFRAN) injection 4 mg  4 mg Intravenous Q6H PRN Debbe Odea, MD   4 mg at 06/22/15 1146  . thiamine (VITAMIN B-1) tablet 100 mg  100 mg Oral Daily Debbe Odea, MD  100 mg at 06/22/15 0909    REVIEW OF SYSTEMS:   Eyes: Vision problem Respiratory: Denies cough, dyspnea or wheezes Cardiovascular: Denies palpitation, chest discomfort Gastrointestinal:  Denies nausea, heartburn or change in bowel habits Neurological:Denies numbness, tingling, complains of generalized weakness Behavioral/Psych: Mood is stable, no new changes  All other systems were reviewed with the patient and are negative.  PHYSICAL EXAMINATION: ECOG PERFORMANCE STATUS: 3 - Symptomatic, >50% confined to bed  Filed Vitals:   06/23/15 0553 06/23/15 0903  BP: 101/66 109/68  Pulse: 134 90  Temp: 98.6 F (37 C)   Resp: 18    Filed Weights   06/19/15 2115 06/20/15 1651 06/23/15 0337  Weight: 149 lb 0.5 oz (67.6 kg) 148 lb 8 oz (67.359 kg) 150 lb 2.1 oz (68.1 kg)    GENERAL:alert, no distress and in moderate degree of pain OROPHARYNX:no exudate, no erythema and lips, buccal mucosa, and tongue normal  NECK: supple, thyroid normal size, non-tender, without nodularity LYMPH:  no palpable lymphadenopathy in the cervical, axillary or inguinal LUNGS: clear to auscultation and percussion with normal breathing effort HEART: regular rate & rhythm and no murmurs and no lower extremity edema ABDOMEN:abdomen soft, non-tender and normal bowel sounds PSYCH: alert & oriented x 3 with fluent speech NEURO: Not examined for motor strength, cranial nerves intact  LABORATORY DATA:  I have reviewed the data as listed Lab Results  Component Value Date   WBC 18.7* 06/23/2015   HGB 6.0* 06/23/2015   HCT 17.9* 06/23/2015   MCV 86.5 06/23/2015   PLT 615* 06/23/2015   Lab Results  Component Value Date   NA 139 06/23/2015   K 3.7 06/23/2015   CL 113* 06/23/2015   CO2 19*  06/23/2015   ASSESSMENT AND PLAN:  A. Severe normocytic anemia with leukocytosis and thrombocytosis The differential diagnosis for his anemia includes 1. Anemia of chronic kidney disease 2. Anemia of chronic inflammation 3. Hemolysis has been ruled out with normal LDH and low reticulocyte index as well as normal direct Coombs test 4. Hypothyroidism has been ruled out with normal TSH  5. We will obtain serum protein electrophoresis to rule out multiple myeloma   B. leukocytosis primarily neutrophilia is felt to be related to underlying inflammation. Overall his white blood cell count has slowly come down but still continues to remain at 18.7. His platelet counts are also slightly better from a high of 836 down to 615. Both of these are primarily related to inflammation/infection plausible source of information may be coming from his hip fracture. Another potential cause of inflammation could be DVT given his immobility. I would like to obtain ultrasound of both lower extremities for verification.  Based on our discussions yesterday, I agree with Dr. Wynelle Cleveland regarding the following treatments that have been instituted 1. Aranesp 500 g given on 06/22/2015 2. IV Feraheme first dose 510 mg given 06/22/2015 3. Foley 3 Mg Orally Daily 4. Vitamin B-12 1000 g subcutaneous daily  Our plan is to watch him for 2 or 3 days. If there is no improvement then a bone marrow biopsy may need to be performed. His reticulocyte index is actually low suggesting hypo-proliferation of his bone marrow. The pretest probability of a bone marrow disorder is low but may be considered if lack of adequate response to conservative treatment.  Patient states that he will discuss with his wife regarding blood transfusion again. It is my understanding that blood transfusion is not to be done based on their religion and  faith but patient stated that he would like to discuss this again with his wife.  No problem-specific  assessment & plan notes found for this encounter.   All questions were answered. The patient knows to call the clinic with any problems, questions or concerns.    Rulon Eisenmenger, MD 9:06 AM

## 2015-06-23 NOTE — Progress Notes (Signed)
ANTICOAGULATION CONSULT NOTE - Follow Up Consult  Pharmacy Consult for Eliquis Indication: atrial fibrillation  Allergies  Allergen Reactions  . Other     NO BLOOD PRODUCTS(see blood/blood product refusal consent) pt in agreement with albumin   . Ace Inhibitors Other (See Comments)    REACTION: Angioedema  . Aspirin Other (See Comments)    Nose bleeds   . Lisinopril Swelling and Other (See Comments)    REACTION: Facial Swelling  . Rocephin [Ceftriaxone Sodium In Dextrose] Hives    Previously tolerated PCN and Ceph 2/14 Hives with ceftriaxone dose 2/15 No reaction with benadryl/ceftriaxone 2/16 Tolerating Zosyn w/o reaction    Patient Measurements: Height: '6\' 4"'$  (193 cm) Weight: 150 lb 2.1 oz (68.1 kg) IBW/kg (Calculated) : 86.8  Vital Signs: Temp: 98.6 F (37 C) (12/04 0553) Temp Source: Oral (12/04 0553) BP: 101/66 mmHg (12/04 0553) Pulse Rate: 134 (12/04 0553)  Labs:  Recent Labs  06/21/15 0628 06/21/15 1444 06/22/15 0520 06/23/15 0546  HGB 6.8* 6.7* 6.1* 6.0*  HCT 20.2* 19.4* 18.3* 17.9*  PLT 653* 675* 624* 615*  CREATININE 0.97  --  1.00 1.10    Estimated Creatinine Clearance: 51.6 mL/min (by C-G formula based on Cr of 1.1).  Assessment: 81 yo M from [place] presenting to the ED on 11/30 with low hemoglobin (low 7's). Pharmacy was  consulted to dose vancomycin for r/o sepsis. Patient is already on Levaquin for UTI - has received a load in the ED, will put in maintenance dose. WBC 23.3, tmax 98.9, SCr 1.13, CrCl ~55 ml/min. LA 2.2  Anticoagulation: Hx of AFib, not on AC PTA as was fall risk but now bedbound due to R hip fx (no surgery until Hgb>8), starting Eliquis. Hgb lower this AM at 6. CHADSVASC: 3 - Paged Rizwan 12/3 about Eliquis and Hgb 6.1. She thinks it is dilutional due to extensive fluid given (+4.6L)   Goal of Therapy:  Therapeutic oral anticoagulation Monitor platelets by anticoagulation protocol: Yes   Plan:  Eliquis '5mg'$  BID for  afib. Would have to hold 3-5d prior to any surgery. D/c Levaquin after today?  Daaiel Starlin S. Alford Highland, PharmD, BCPS Clinical Staff Pharmacist Pager [phone]  Eilene Ghazi Stillinger 06/23/2015,7:38 AM

## 2015-06-23 NOTE — Progress Notes (Signed)
[facility] Progress Note   Bob Wilson  ZYS:[id number]  DOB: 04-12-1933  DOA: 06/19/2015 PCP: Owens Loffler, MD  Brief narrative: Bob Wilson is a 81 y.o. male with foley cath since 7/16, recent right hip fracture that has not been repaired, anemia of chronic disease, HTN, alcoholic cirrhosis, A-fib and alcoholic neuropathy who is sent from the nursing facility for leukocytosis. Admitted for sepsis secondary to a UTI. Recent hip fracture has not been repaired due to anemia. 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 He received Iron infusions at the cancer center. He has been bed bound since his fx.   Subjective: Feels tired today. No other complaints.   Assessment/Plan: Principal Problem:   Sepsis secondary to UTI  -Tachycardia and hypotension resolved-lactic acid normalized -UA reveals multiple species and recollection is suggested -Continue levofloxacin- discontinue vancomycin - leukocytosis persisting despite antibiotics and despite resolution of other signs of sepsis-  Stress response?  - follow   Active Problems: A- fib with RVR - CHA2DS2-VASc Score 3 -Cardizem resumed  - not on anticoagulation (alcoholic, had frequent falls- due to alcoholic neuropathy?) but as he is no longer a fall risk due to being bed bound, can be started on anticoagulation at this point (may be just temporary) - started Eliquis  Right hip fx - occurring on 06/01/15 - bedrest- hip will not be repaired until Hb > 8  Anemia / leukocytosis and thrombocytosis - needs Hb > 8 in order to have surgery - drop in Hb noted during the hospital stay- likely dilutional-   CT of the hip neg for bleeding  - folate and B12 deficiency noted to be low as well- replacing - discussed with hematology- given Feraheme infusion 12/3- giving Aranesp daily - Dr Lindi Adie assisting with management and feels the thrombocytosis and leukocytosis may be a stress  response  Sacral decubitus ulcers - due to being bed bound - wound care eval- air mattress- recommend Air mattress at SNF as he is not able to be re-positioned due to hip fx.   Alcoholic cirrhosis - cont Lactulose  Protein calorie malnutrition  - cont Ensure  Gout - cont Colchicine      Code Status:     Code Status Orders        Start     Ordered   06/19/15 1910  Full code   Continuous     06/19/15 1910     Family Communication:   Disposition Plan: return to SNF in 1-2 days DVT prophylaxis: Eliquis Consultants: Procedures:   Antibiotics: Anti-infectives    Start     Dose/Rate Route Frequency Ordered Stop   06/20/15 1700  levofloxacin (LEVAQUIN) IVPB 750 mg     750 mg 100 mL/hr over 90 Minutes Intravenous Every 24 hours 06/19/15 1926     06/20/15 0800  vancomycin (VANCOCIN) IVPB 750 mg/150 ml premix  Status:  Discontinued     750 mg 150 mL/hr over 60 Minutes Intravenous Every 12 hours 06/19/15 1926 06/21/15 1230   06/19/15 2000  vancomycin (VANCOCIN) 1,500 mg in sodium chloride 0.9 % 500 mL IVPB     1,500 mg 250 mL/hr over 120 Minutes Intravenous  Once 06/19/15 1926 06/20/15 0128   06/19/15 1545  levofloxacin (LEVAQUIN) IVPB 750 mg     750 mg 100 mL/hr over 90 Minutes Intravenous  Once 06/19/15 1543 06/19/15 1828      Objective: Filed Weights   06/19/15  2115 06/20/15 1651 06/23/15 0337  Weight: 67.6 kg (149 lb 0.5 oz) 67.359 kg (148 lb 8 oz) 68.1 kg (150 lb 2.1 oz)    Intake/Output Summary (Last 24 hours) at 06/23/15 1504 Last data filed at 06/23/15 1041  Gross per 24 hour  Intake    350 ml  Output    700 ml  Net   -350 ml     Vitals Filed Vitals:   06/22/15 2204 06/23/15 0337 06/23/15 0553 06/23/15 0903  BP: 92/56  101/66 109/68  Pulse: 77  134 90  Temp: 98 F (36.7 C)  98.6 F (37 C)   TempSrc: Oral  Oral   Resp: 18  18   Height:      Weight:  68.1 kg (150 lb 2.1 oz)    SpO2: 100%  100%     Exam:  General:  Pt is alert, not in acute  distress  HEENT: No icterus, No thrush, oral mucosa moist  Cardiovascular: regular rate and rhythm, S1/S2 No murmur  Respiratory: clear to auscultation bilaterally   Abdomen: Soft, +Bowel sounds, non tender, non distended, no guarding  MSK: No LE edema, cyanosis or clubbing  Data Reviewed: Basic Metabolic Panel:  Recent Labs Lab 06/19/15 1658 06/20/15 0313 06/21/15 0628 06/22/15 0520 06/23/15 0546  NA 135 135 135 137 139  K 4.1 3.6 3.4* 4.1 3.7  CL 104 107 108 111 113*  CO2 23 23 20* 20* 19*  GLUCOSE 106* 114* 97 90 91  BUN '16 14 12 13 14  '$ CREATININE 1.13 1.03 0.97 1.00 1.10  CALCIUM 8.3* 7.9* 8.1* 8.1* 8.2*   Liver Function Tests:  Recent Labs Lab 06/20/15 0313  AST 19  ALT 12*  ALKPHOS 388*  BILITOT 0.7  PROT 5.6*  ALBUMIN 1.4*   No results for input(s): LIPASE, AMYLASE in the last 168 hours. No results for input(s): AMMONIA in the last 168 hours. CBC:  Recent Labs Lab 06/19/15 1658 06/20/15 1510 06/21/15 0628 06/21/15 1444 06/22/15 0520 06/23/15 0546  WBC 23.3* 18.7* 19.2* 18.6* 17.1* 18.7*  NEUTROABS 18.3*  --   --   --   --   --   HGB 7.5* 7.0* 6.8* 6.7* 6.1* 6.0*  HCT 22.8* 20.9* 20.2* 19.4* 18.3* 17.9*  MCV 85.4 85.7 85.6 85.8 85.9 86.5  PLT 836* 721* 653* 675* 624* 615*   Cardiac Enzymes: No results for input(s): CKTOTAL, CKMB, CKMBINDEX, TROPONINI in the last 168 hours. BNP (last 3 results)  Recent Labs  09/02/14 1526  BNP 431.0*    ProBNP (last 3 results) No results for input(s): PROBNP in the last 8760 hours.  CBG: No results for input(s): GLUCAP in the last 168 hours.  Recent Results (from the past 240 hour(s))  Urine culture     Status: None   Collection Time: 06/19/15  2:55 PM  Result Value Ref Range Status   Specimen Description URINE, RANDOM  Final   Special Requests NONE  Final   Culture MULTIPLE SPECIES PRESENT, SUGGEST RECOLLECTION  Final   Report Status 06/20/2015 FINAL  Final  MRSA PCR Screening     Status:  None   Collection Time: 06/19/15 10:43 PM  Result Value Ref Range Status   MRSA by PCR NEGATIVE NEGATIVE Final    Comment:        The GeneXpert MRSA Assay (FDA approved for NASAL specimens only), is one component of a comprehensive MRSA colonization surveillance program. It is not intended to diagnose MRSA infection nor  to guide or monitor treatment for MRSA infections.      Studies: No results found.  Scheduled Meds:  Scheduled Meds: . apixaban  5 mg Oral BID  . colchicine  0.6 mg Oral QHS  . cyanocobalamin  1,000 mcg Subcutaneous Daily  . diltiazem  240 mg Oral Daily  . dronabinol  2.5 mg Oral QAC lunch  . feeding supplement (ENSURE ENLIVE)  237 mL Oral TID BM  . folic acid  3 mg Oral Daily  . HYDROcodone-acetaminophen  1 tablet Oral Q4H  . lactulose  20 g Oral Daily  . levofloxacin (LEVAQUIN) IV  750 mg Intravenous Q24H  . metaxalone  800 mg Oral TID  . mirtazapine  15 mg Oral QHS  . thiamine  100 mg Oral Daily   Continuous Infusions:    Time spent on care of this patient: 35 min   [place], MD 06/23/2015, 3:04 PM  LOS: 4 days   [facility] Office  [phone] Pager - Text Page per [email or web address] If 7PM-7AM, please contact night-coverage [email or web address]

## 2015-06-23 NOTE — Progress Notes (Signed)
Pt. Is being moved to 5W33. MD called for STAT EKG on pt. Pt. Is already on telemetry. Obtaining EKG. Pt. Appears to be in A-fib and ST. Called [facility] to verify and it was confirmed.  15:30 EKG results obtained. Text paged MD. Primary Nurse (Delcine, RN) to follow with care

## 2015-06-23 NOTE — Progress Notes (Signed)
Pt pulse 134 md on call was paged called back and said since the BP 101/66 he doesn't want to give anything to drop the BP, will continue to monitor him

## 2015-06-24 ENCOUNTER — Inpatient Hospital Stay (HOSPITAL_COMMUNITY): Payer: Medicare Other

## 2015-06-24 DIAGNOSIS — D519 Vitamin B12 deficiency anemia, unspecified: Secondary | ICD-10-CM

## 2015-06-24 DIAGNOSIS — Z7409 Other reduced mobility: Secondary | ICD-10-CM

## 2015-06-24 DIAGNOSIS — I10 Essential (primary) hypertension: Secondary | ICD-10-CM

## 2015-06-24 LAB — BASIC METABOLIC PANEL
ANION GAP: 5 (ref 5–15)
BUN: 16 mg/dL (ref 6–20)
CALCIUM: 8.4 mg/dL — AB (ref 8.9–10.3)
CO2: 21 mmol/L — ABNORMAL LOW (ref 22–32)
Chloride: 112 mmol/L — ABNORMAL HIGH (ref 101–111)
Creatinine, Ser: 1.05 mg/dL (ref 0.61–1.24)
GFR calc Af Amer: >60 mL/min (ref >60)
GFR calc non Af Amer: >60 mL/min (ref >60)
Glucose, Bld: 102 mg/dL — ABNORMAL HIGH (ref 65–99)
POTASSIUM: 3.8 mmol/L (ref 3.5–5.1)
SODIUM: 138 mmol/L (ref 135–145)

## 2015-06-24 LAB — CBC
HCT: 18.1 % — ABNORMAL LOW (ref 39.0–52.0)
Hemoglobin: 6 g/dL — CL (ref 13.0–17.0)
MCH: 28.7 pg (ref 26.0–34.0)
MCHC: 33.1 g/dL (ref 30.0–36.0)
MCV: 86.6 fL (ref 78.0–100.0)
PLATELETS: 587 10*3/uL — AB (ref 150–400)
RBC: 2.09 MIL/uL — AB (ref 4.22–5.81)
RDW: 18.3 % — AB (ref 11.5–15.5)
WBC: 17.7 10*3/uL — AB (ref 4.0–10.5)

## 2015-06-24 MED ORDER — METOPROLOL TARTRATE 1 MG/ML IV SOLN
5.0000 mg | Freq: Once | INTRAVENOUS | Status: AC
  Administered 2015-06-24: 5 mg via INTRAVENOUS
  Filled 2015-06-24: qty 5

## 2015-06-24 MED ORDER — LEVOFLOXACIN 750 MG PO TABS
750.0000 mg | ORAL_TABLET | Freq: Every day | ORAL | Status: AC
  Administered 2015-06-24 – 2015-06-25 (×2): 750 mg via ORAL
  Filled 2015-06-24 (×2): qty 1

## 2015-06-24 MED ORDER — METOPROLOL TARTRATE 25 MG PO TABS
25.0000 mg | ORAL_TABLET | Freq: Two times a day (BID) | ORAL | Status: DC
  Administered 2015-06-24 – 2015-06-25 (×2): 25 mg via ORAL
  Filled 2015-06-24 (×3): qty 1

## 2015-06-24 NOTE — Progress Notes (Signed)
[facility] Progress Note   Bob Wilson  WEX:[id number]  DOB: [birth year]  DOA: 06/19/2015 PCP: Owens Loffler, MD  Brief narrative: Bob Wilson is a 81 y.o. male with foley cath since 7/16, recent right hip fracture that has not been repaired, anemia of chronic disease, HTN, alcoholic cirrhosis, A-fib and alcoholic neuropathy who is sent from the nursing facility for leukocytosis. Admitted for sepsis secondary to a UTI. Recent hip fracture has not been repaired due to anemia. 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 He received Iron infusions at the cancer center. He has been bed bound since his fx.   Subjective: No c/o shortness of breath, cough or chest pain.   Assessment/Plan: Principal Problem:   Sepsis secondary to UTI  -Tachycardia and hypotension resolved-lactic acid normalized -UA reveals multiple species and recollection is suggested -Continue levofloxacin- discontinue vancomycin - leukocytosis persisting despite antibiotics and despite resolution of other signs of sepsis-  Stress response?  - follow   Active Problems: A- fib with RVR - CHA2DS2-VASc Score 3 -Cardizem resumed - HR still rapid- start B blocker - not on anticoagulation (alcoholic, had frequent falls- due to alcoholic neuropathy?) but as he is no longer a fall risk due to being bed bound, can be started on anticoagulation at this point (may be just temporary) - started Eliquis  Right hip fx - occurring on 06/01/15 - bedrest- hip will not be repaired until Hb > 8  Anemia / leukocytosis and thrombocytosis - needs Hb > 8 in order to have surgery - drop in Hb noted during the hospital stay- likely dilutional-   CT of the hip neg for bleeding  - folate and B12 deficiency noted to be low as well- replacing - discussed with hematology- given Feraheme infusion 12/3- giving Aranesp daily - Dr Lindi Adie assisting with management and feels the  thrombocytosis and leukocytosis may be a stress response  Sacral decubitus ulcers - due to being bed bound - wound care eval- air mattress- recommend Air mattress at SNF as he is not able to be re-positioned due to hip fx.   Alcoholic cirrhosis - cont Lactulose  Protein calorie malnutrition  - cont Ensure  Gout - cont Colchicine      Code Status:     Code Status Orders        Start     Ordered   06/19/15 1910  Full code   Continuous     06/19/15 1910     Family Communication:   Disposition Plan: return to SNF in 1-2 days DVT prophylaxis: Eliquis Consultants: Procedures:   Antibiotics: Anti-infectives    Start     Dose/Rate Route Frequency Ordered Stop   06/24/15 1800  levofloxacin (LEVAQUIN) tablet 750 mg     750 mg Oral Daily-1800 06/24/15 0853 06/26/15 1759   06/20/15 1700  levofloxacin (LEVAQUIN) IVPB 750 mg  Status:  Discontinued     750 mg 100 mL/hr over 90 Minutes Intravenous Every 24 hours 06/19/15 1926 06/24/15 0852   06/20/15 0800  vancomycin (VANCOCIN) IVPB 750 mg/150 ml premix  Status:  Discontinued     750 mg 150 mL/hr over 60 Minutes Intravenous Every 12 hours 06/19/15 1926 06/21/15 1230   06/19/15 2000  vancomycin (VANCOCIN) 1,500 mg in sodium chloride 0.9 % 500 mL IVPB     1,500 mg 250 mL/hr over 120 Minutes Intravenous  Once 06/19/15 1926 06/20/15 0128   06/19/15 1545  levofloxacin (LEVAQUIN) IVPB 750 mg     750 mg 100 mL/hr over 90 Minutes Intravenous  Once 06/19/15 1543 06/19/15 1828      Objective: Filed Weights   06/23/15 0337 06/24/15 0020 06/24/15 0106  Weight: 68.1 kg (150 lb 2.1 oz) 68.13 kg (150 lb 3.2 oz) 68.13 kg (150 lb 3.2 oz)    Intake/Output Summary (Last 24 hours) at 06/24/15 1534 Last data filed at 06/24/15 1453  Gross per 24 hour  Intake    577 ml  Output   1050 ml  Net   -473 ml     Vitals Filed Vitals:   06/24/15 0020 06/24/15 0106 06/24/15 0552 06/24/15 1304  BP:   109/77 115/59  Pulse:   109 128  Temp:    97.8 F (36.6 C) 98.1 F (36.7 C)  TempSrc:   Oral Oral  Resp:   18 18  Height:      Weight: 68.13 kg (150 lb 3.2 oz) 68.13 kg (150 lb 3.2 oz)    SpO2:   98% 100%    Exam:  General:  Pt is alert, not in acute distress  HEENT: No icterus, No thrush, oral mucosa moist  Cardiovascular: IIRR, S1/S2 No murmur  Respiratory: clear to auscultation bilaterally   Abdomen: Soft, +Bowel sounds, non tender, non distended, no guarding  MSK: No LE edema, cyanosis or clubbing  Data Reviewed: Basic Metabolic Panel:  Recent Labs Lab 06/20/15 0313 06/21/15 0628 06/22/15 0520 06/23/15 0546 06/24/15 1100  NA 135 135 137 139 138  K 3.6 3.4* 4.1 3.7 3.8  CL 107 108 111 113* 112*  CO2 23 20* 20* 19* 21*  GLUCOSE 114* 97 90 91 102*  BUN '14 12 13 14 16  '$ CREATININE 1.03 0.97 1.00 1.10 1.05  CALCIUM 7.9* 8.1* 8.1* 8.2* 8.4*   Liver Function Tests:  Recent Labs Lab 06/20/15 0313  AST 19  ALT 12*  ALKPHOS 388*  BILITOT 0.7  PROT 5.6*  ALBUMIN 1.4*   No results for input(s): LIPASE, AMYLASE in the last 168 hours. No results for input(s): AMMONIA in the last 168 hours. CBC:  Recent Labs Lab 06/19/15 1658  06/21/15 0628 06/21/15 1444 06/22/15 0520 06/23/15 0546 06/24/15 1100  WBC 23.3*  < > 19.2* 18.6* 17.1* 18.7* 17.7*  NEUTROABS 18.3*  --   --   --   --   --   --   HGB 7.5*  < > 6.8* 6.7* 6.1* 6.0* 6.0*  HCT 22.8*  < > 20.2* 19.4* 18.3* 17.9* 18.1*  MCV 85.4  < > 85.6 85.8 85.9 86.5 86.6  PLT 836*  < > 653* 675* 624* 615* 587*  < > = values in this interval not displayed. Cardiac Enzymes: No results for input(s): CKTOTAL, CKMB, CKMBINDEX, TROPONINI in the last 168 hours. BNP (last 3 results)  Recent Labs  09/02/14 1526  BNP 431.0*    ProBNP (last 3 results) No results for input(s): PROBNP in the last 8760 hours.  CBG: No results for input(s): GLUCAP in the last 168 hours.  Recent Results (from the past 240 hour(s))  Urine culture     Status: None    Collection Time: 06/19/15  2:55 PM  Result Value Ref Range Status   Specimen Description URINE, RANDOM  Final   Special Requests NONE  Final   Culture MULTIPLE SPECIES PRESENT, SUGGEST RECOLLECTION  Final   Report Status 06/20/2015 FINAL  Final  MRSA PCR Screening     Status:  None   Collection Time: 06/19/15 10:43 PM  Result Value Ref Range Status   MRSA by PCR NEGATIVE NEGATIVE Final    Comment:        The GeneXpert MRSA Assay (FDA approved for NASAL specimens only), is one component of a comprehensive MRSA colonization surveillance program. It is not intended to diagnose MRSA infection nor to guide or monitor treatment for MRSA infections.      Studies: No results found.  Scheduled Meds:  Scheduled Meds: . apixaban  5 mg Oral BID  . colchicine  0.6 mg Oral QHS  . cyanocobalamin  1,000 mcg Subcutaneous Daily  . diltiazem  240 mg Oral Daily  . dronabinol  2.5 mg Oral QAC lunch  . feeding supplement (ENSURE ENLIVE)  237 mL Oral TID BM  . folic acid  3 mg Oral Daily  . HYDROcodone-acetaminophen  1 tablet Oral Q4H  . lactulose  20 g Oral Daily  . levofloxacin  750 mg Oral q1800  . metaxalone  800 mg Oral TID  . mirtazapine  15 mg Oral QHS  . thiamine  100 mg Oral Daily   Continuous Infusions:    Time spent on care of this patient: 35 min   [place], MD 06/24/2015, 3:34 PM  LOS: 5 days   [facility] Office  [phone] Pager - Text Page per [email or web address] If 7PM-7AM, please contact night-coverage [email or web address]

## 2015-06-24 NOTE — Progress Notes (Signed)
VASCULAR LAB PRELIMINARY  PRELIMINARY  PRELIMINARY  PRELIMINARY  Bilateral lower extremity venous duplex  completed.    Preliminary report:  Bilateral:  No evidence of DVT, superficial thrombosis, or Baker's Cyst.    [place] Skalicky, RVT 06/24/2015, 3:30 PM

## 2015-06-24 NOTE — Progress Notes (Signed)
HEMATOLOGY Labs reviewed. Hb 6, WBC 17.7 and platelets 587 CBC    Component Value Date/Time   WBC 17.7* 06/24/2015 1100   RBC 2.09* 06/24/2015 1100   RBC 2.26* 06/21/2015 1444   HGB 6.0* 06/24/2015 1100   HCT 18.1* 06/24/2015 1100   PLT 587* 06/24/2015 1100   MCV 86.6 06/24/2015 1100   MCH 28.7 06/24/2015 1100   MCHC 33.1 06/24/2015 1100   RDW 18.3* 06/24/2015 1100   LYMPHSABS 1.8 06/19/2015 1658   MONOABS 3.0* 06/19/2015 1658   EOSABS 0.1 06/19/2015 1658   BASOSABS 0.0 06/19/2015 1658    -Anemia: Patient received IV Iron, B12, Folate and Aranesp. There is nothing more we can do. Being Bob Wilson witness, transfusions are not an option.  - leucocytosis and Thrombocytosis: prob related to inflammation in the hip: slow improvement

## 2015-06-25 DIAGNOSIS — E538 Deficiency of other specified B group vitamins: Secondary | ICD-10-CM

## 2015-06-25 DIAGNOSIS — L89150 Pressure ulcer of sacral region, unstageable: Secondary | ICD-10-CM | POA: Diagnosis not present

## 2015-06-25 DIAGNOSIS — K7030 Alcoholic cirrhosis of liver without ascites: Secondary | ICD-10-CM | POA: Diagnosis not present

## 2015-06-25 DIAGNOSIS — R627 Adult failure to thrive: Secondary | ICD-10-CM | POA: Diagnosis not present

## 2015-06-25 DIAGNOSIS — I10 Essential (primary) hypertension: Secondary | ICD-10-CM | POA: Diagnosis not present

## 2015-06-25 DIAGNOSIS — M109 Gout, unspecified: Secondary | ICD-10-CM | POA: Diagnosis not present

## 2015-06-25 DIAGNOSIS — R296 Repeated falls: Secondary | ICD-10-CM | POA: Diagnosis not present

## 2015-06-25 DIAGNOSIS — E46 Unspecified protein-calorie malnutrition: Secondary | ICD-10-CM | POA: Diagnosis not present

## 2015-06-25 DIAGNOSIS — D519 Vitamin B12 deficiency anemia, unspecified: Secondary | ICD-10-CM | POA: Diagnosis not present

## 2015-06-25 DIAGNOSIS — I4891 Unspecified atrial fibrillation: Secondary | ICD-10-CM | POA: Diagnosis not present

## 2015-06-25 DIAGNOSIS — A419 Sepsis, unspecified organism: Secondary | ICD-10-CM | POA: Diagnosis not present

## 2015-06-25 DIAGNOSIS — S72001A Fracture of unspecified part of neck of right femur, initial encounter for closed fracture: Secondary | ICD-10-CM | POA: Diagnosis not present

## 2015-06-25 DIAGNOSIS — L89154 Pressure ulcer of sacral region, stage 4: Secondary | ICD-10-CM | POA: Diagnosis not present

## 2015-06-25 DIAGNOSIS — R918 Other nonspecific abnormal finding of lung field: Secondary | ICD-10-CM | POA: Diagnosis not present

## 2015-06-25 DIAGNOSIS — D638 Anemia in other chronic diseases classified elsewhere: Secondary | ICD-10-CM | POA: Diagnosis not present

## 2015-06-25 DIAGNOSIS — R630 Anorexia: Secondary | ICD-10-CM | POA: Diagnosis not present

## 2015-06-25 DIAGNOSIS — E876 Hypokalemia: Secondary | ICD-10-CM | POA: Diagnosis not present

## 2015-06-25 DIAGNOSIS — I480 Paroxysmal atrial fibrillation: Secondary | ICD-10-CM | POA: Diagnosis not present

## 2015-06-25 DIAGNOSIS — Z9289 Personal history of other medical treatment: Secondary | ICD-10-CM | POA: Diagnosis not present

## 2015-06-25 DIAGNOSIS — D539 Nutritional anemia, unspecified: Secondary | ICD-10-CM

## 2015-06-25 DIAGNOSIS — D649 Anemia, unspecified: Secondary | ICD-10-CM | POA: Diagnosis not present

## 2015-06-25 DIAGNOSIS — L89100 Pressure ulcer of unspecified part of back, unstageable: Secondary | ICD-10-CM | POA: Diagnosis not present

## 2015-06-25 DIAGNOSIS — S72001D Fracture of unspecified part of neck of right femur, subsequent encounter for closed fracture with routine healing: Secondary | ICD-10-CM | POA: Diagnosis not present

## 2015-06-25 DIAGNOSIS — S72001S Fracture of unspecified part of neck of right femur, sequela: Secondary | ICD-10-CM | POA: Diagnosis not present

## 2015-06-25 DIAGNOSIS — N312 Flaccid neuropathic bladder, not elsewhere classified: Secondary | ICD-10-CM | POA: Diagnosis not present

## 2015-06-25 DIAGNOSIS — N390 Urinary tract infection, site not specified: Secondary | ICD-10-CM | POA: Diagnosis not present

## 2015-06-25 MED ORDER — HYDROCODONE-ACETAMINOPHEN 10-325 MG PO TABS: 1.0000 | ORAL_TABLET | ORAL | Status: AC

## 2015-06-25 MED ORDER — APIXABAN 5 MG PO TABS: 5.0000 mg | ORAL_TABLET | Freq: Two times a day (BID) | ORAL | Status: AC

## 2015-06-25 MED ORDER — METOPROLOL TARTRATE 25 MG PO TABS: 25.0000 mg | ORAL_TABLET | Freq: Two times a day (BID) | ORAL | Status: AC

## 2015-06-25 MED ORDER — FOLIC ACID 1 MG PO TABS: 3.0000 mg | ORAL_TABLET | Freq: Every day | ORAL | Status: AC

## 2015-06-25 MED ORDER — FUROSEMIDE 40 MG PO TABS: 40.0000 mg | ORAL_TABLET | Freq: Every day | ORAL | Status: AC | PRN

## 2015-06-25 MED ORDER — POTASSIUM CHLORIDE ER 20 MEQ PO TBCR: 20.0000 meq | EXTENDED_RELEASE_TABLET | Freq: Every day | ORAL | Status: AC | PRN

## 2015-06-25 NOTE — Progress Notes (Signed)
Nsg Discharge Note  Admit Date:  06/19/2015 Discharge date: 06/25/2015   Bob Wilson to be D/C'd SNF per MD order.  AVS completed.  Copy for chart, and copy for patient signed, and dated. Patient/caregiver able to verbalize understanding.  Discharge Medication:   Medication List    STOP taking these medications        ferumoxytol 510 MG/17ML Soln injection  Commonly known as:  FERAHEME      TAKE these medications        apixaban 5 MG Tabs tablet  Commonly known as:  ELIQUIS  Take 1 tablet (5 mg total) by mouth 2 (two) times daily.     colchicine 0.6 MG tablet  Take 0.6 mg by mouth at bedtime.     cyanocobalamin 1000 MCG/ML injection  Commonly known as:  (VITAMIN B-12)  1000 mcg [place] daily for 7 days from 06/05/15, then, 1000 mcg [place] weekly for 1 month, then, 1000 mcg [place] monthly.     Darbepoetin Alfa 500 MCG/ML Sosy injection  Commonly known as:  ARANESP (ALBUMIN FREE)  Inject 1 mL (500 mcg total) into the skin every 21 ( twenty-one) days.     diltiazem 240 MG 24 hr capsule  Commonly known as:  CARDIZEM CD  Take 1 capsule (240 mg total) by mouth daily.     dronabinol 2.5 MG capsule  Commonly known as:  MARINOL  Take 1 capsule (2.5 mg total) by mouth daily before lunch.     feeding supplement (ENSURE ENLIVE) Liqd  Take 237 mLs by mouth 3 (three) times daily between meals.     folic acid 1 MG tablet  Commonly known as:  FOLVITE  Take 3 tablets (3 mg total) by mouth daily.     furosemide 40 MG tablet  Commonly known as:  LASIX  Take 1 tablet (40 mg total) by mouth daily as needed for fluid or edema.     HYDROcodone-acetaminophen 10-325 MG tablet  Commonly known as:  NORCO  Take 1 tablet by mouth every 4 (four) hours.     lactulose 10 GM/15ML solution  Commonly known as:  CHRONULAC  Take 30 mLs (20 g total) by mouth daily.     metaxalone 800 MG tablet  Commonly known as:  SKELAXIN  Take 1 tablet (800 mg total) by mouth 3 (three) times daily.     metoprolol  tartrate 25 MG tablet  Commonly known as:  LOPRESSOR  Take 1 tablet (25 mg total) by mouth 2 (two) times daily.     mirtazapine 15 MG tablet  Commonly known as:  REMERON  Take 1 tablet (15 mg total) by mouth at bedtime.     Potassium Chloride ER 20 MEQ Tbcr  Take 20 mEq by mouth daily as needed (when taking Lasix).     thiamine 100 MG tablet  Take 1 tablet (100 mg total) by mouth daily.        Discharge Assessment: Filed Vitals:   06/25/15 0825 06/25/15 1400  BP: 103/71 95/59  Pulse: 102 127  Temp:  97.7 F (36.5 C)  Resp: 18 18   Skin clean, dry foam on backside to the stage II. IV catheter discontinued intact. Site without signs and symptoms of complications - no redness or edema noted at insertion site, patient denies c/o pain - only slight tenderness at site.  Dressing with slight pressure applied.  D/c Instructions-Education: Discharge instructions given to patient/family with verbalized understanding. D/c education completed with patient/family including follow  up instructions, medication list, d/c activities limitations if indicated, with other d/c instructions as indicated by MD - patient able to verbalize understanding, all questions fully answered. Patient instructed to return to ED, call 911, or call MD for any changes in condition.  Patient escorted via EMS to SNF, report called into Bob Wilson.  [name], RN 06/25/2015 5:50 PM

## 2015-06-25 NOTE — Care Management Note (Signed)
Case Management Note  Patient Details  Name: SHAWAN CORELLA MRN: [id number] Date of Birth: 08-Aug-1932  Subjective/Objective:    Patient for dc today back to [facility], [place] following.                Action/Plan:   Expected Discharge Date:                  Expected Discharge Plan:  Skilled Nursing Facility  In-House Referral:  Clinical Social Work  Discharge planning Services  CM Consult  Post Acute Care Choice:    Choice offered to:     DME Arranged:    DME Agency:     HH Arranged:    [place] Agency:     Status of Service:  Completed, signed off  Medicare Important Message Given:  Yes Date Medicare IM Given:    Medicare IM give by:    Date Additional Medicare IM Given:    Additional Medicare Important Message give by:     If discussed at [place] of Stay Meetings, dates discussed:    Additional Comments:  Zenon Mayo, RN 06/25/2015, 11:41 AM

## 2015-06-25 NOTE — Discharge Summary (Signed)
Physician Discharge Summary  Bob Wilson JOA:[id number] DOB: 07/13/1933 DOA: 06/19/2015  PCP: Bob Loffler, MD  Admit date: 06/19/2015 Discharge date: 06/25/2015  Time spent: 60 minutes  Recommendations for Outpatient Follow-up:  1. Daily weights- Lasix PRN 2. Check HR BID- modify dose of Cardizem and Metoprolol at needed for RVR 3. Check Hb weekly- refer questions to Bob Bob Wilson (hematology) 4. Currently not ambulating and therefore started on Eliquis for A-fib- will need to assess fall risk once his hip is repaired and re-visit anticoagulation  5. Needs air mattress at SNF  Discharge Condition: stable    Discharge Diagnoses:  Principal Problem:   Sepsis secondary to UTI [facility]) Active Problems:   Complicated UTI (urinary tract infection)-indwelling Foley catheter.   Deficiency anemia- Iron, folic acid and S01 deficiency    Essential hypertension   Protein-calorie malnutrition (HCC)   Cirrhosis, alcoholic (HCC)   Chronic indwelling Foley catheter   Femoral neck fracture, right, closed, initial encounter   Anemia, chronic disease   Decubitus ulcer of sacral area   Folic acid deficiency   History of present illness:  Bob Wilson is a 81 y.o. male with foley cath since 7/16, recent right hip fracture that has not yet been repaired, anemia for which he is declining a transfusion (Jehovah's witness), HTN, alcoholic cirrhosis, A-fib, alcoholic neuropathy and frequent falls who is sent from the nursing facility for leukocytosis. Admitted for sepsis secondary to a UTI. Recent hip fracture has not been repaired due to anemia. 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 He has been bed bound since his fx.  Hospital Course:  Principal Problem:  Sepsis secondary to UTI  -Tachycardia and hypotension resolved and lactic acid normalized by day 2 -UA reveals multiple species (chronic foley) and recollection is suggested  -Levofloxacin  given for 7 days total-  vancomycin stopped on 12/1- no recurrence of sepsis - leukocytosis persisting despite antibiotics and despite resolution of other signs of sepsis- Stress response? see below   Active Problems: A- fib with RVR - CHA2DS2-VASc Score 3 -Cardizem resumed - HR was still rapid- started Metoprolol 25 BID- HR well controlled since - previously not on anticoagulation (alcoholic, had frequent falls- due to alcoholic neuropathy?)  - due to his fracture and immobility, he is no longer a fall risk and can be started on anticoagulation at this point (may be just temporary) - started Eliquis  Right hip fx - after a fall occurring on 06/01/15 - bedrest- hip will not be repaired until Hb > 8 (Bob Wilson)  Anemia / leukocytosis and thrombocytosis - needs Hb > 8 in order to have surgery- he is a Jehovah's witness and will not take blood  - drop in Hb to 6.0 noted during the hospital stay- likely dilutional-CT of the hip neg for bleeding  - folate and B12 deficiency noted to be low as well- replacing daily  - discussed with hematology- given second dose of Feraheme infusion 12/3 (first dose given on 11/16) - giving Aranesp daily - Bob Bob Wilson assisting with management and feels the thrombocytosis and leukocytosis may be a stress response to his hip fracture  Sacral decubitus ulcers - due to being bed bound - wound care eval- foam dressing recommended - change Q 5 days - he is not able to be re-positioned due to his hip fracture- air mattress ordered - recommending Air mattress at SNF as he is not able to be re-positioned  due to hip fx.   Alcoholic cirrhosis - cont Lactulose  Protein calorie malnutrition  - cont Ensure  Gout - cont Colchicine   Procedures:  none  Consultations:  Hematology- Bob Wilson  Discharge Exam: Filed Weights   06/23/15 1062 06/24/15 0020 06/24/15 0106  Weight: 68.1 kg (150 lb 2.1 oz) 68.13 kg (150 lb 3.2 oz) 68.13 kg (150 lb 3.2 oz)   Filed  Vitals:   06/25/15 0555 06/25/15 0825  BP: 95/61 103/71  Pulse: 112 102  Temp: 97.6 F (36.4 C)   Resp: 18 18    General: AAO x 3, no distress Cardiovascular: RRR, no murmurs  Respiratory: clear to auscultation bilaterally GI: soft, non-tender, non-distended, bowel sound positive  Discharge Instructions You were cared for by a hospitalist during your hospital stay. If you have any questions about your discharge medications or the care you received while you were in the hospital after you are discharged, you can call the unit and asked to speak with the hospitalist on call if the hospitalist that took care of you is not available. Once you are discharged, your primary care physician will handle any further medical issues. Please note that NO REFILLS for any discharge medications will be authorized once you are discharged, as it is imperative that you return to your primary care physician (or establish a relationship with a primary care physician if you do not have one) for your aftercare needs so that they can reassess your need for medications and monitor your lab values.      Discharge Instructions    Diet - low sodium heart healthy    Complete by:  As directed      Increase activity slowly    Complete by:  As directed             Medication List    STOP taking these medications        ferumoxytol 510 MG/17ML Soln injection  Commonly known as:  FERAHEME      TAKE these medications        apixaban 5 MG Tabs tablet  Commonly known as:  ELIQUIS  Take 1 tablet (5 mg total) by mouth 2 (two) times daily.     colchicine 0.6 MG tablet  Take 0.6 mg by mouth at bedtime.     cyanocobalamin 1000 MCG/ML injection  Commonly known as:  (VITAMIN B-12)  1000 mcg [place] daily for 7 days from 06/05/15, then, 1000 mcg [place] weekly for 1 month, then, 1000 mcg [place] monthly.     Darbepoetin Alfa 500 MCG/ML Sosy injection  Commonly known as:  ARANESP (ALBUMIN FREE)  Inject 1 mL (500 mcg total)  into the skin every 21 ( twenty-one) days.     diltiazem 240 MG 24 hr capsule  Commonly known as:  CARDIZEM CD  Take 1 capsule (240 mg total) by mouth daily.     dronabinol 2.5 MG capsule  Commonly known as:  MARINOL  Take 1 capsule (2.5 mg total) by mouth daily before lunch.     feeding supplement (ENSURE ENLIVE) Liqd  Take 237 mLs by mouth 3 (three) times daily between meals.     folic acid 1 MG tablet  Commonly known as:  FOLVITE  Take 3 tablets (3 mg total) by mouth daily.     furosemide 40 MG tablet  Commonly known as:  LASIX  Take 1 tablet (40 mg total) by mouth daily as needed for fluid or edema.  HYDROcodone-acetaminophen 10-325 MG tablet  Commonly known as:  NORCO  Take 1 tablet by mouth every 4 (four) hours.     lactulose 10 GM/15ML solution  Commonly known as:  CHRONULAC  Take 30 mLs (20 g total) by mouth daily.     metaxalone 800 MG tablet  Commonly known as:  SKELAXIN  Take 1 tablet (800 mg total) by mouth 3 (three) times daily.     metoprolol tartrate 25 MG tablet  Commonly known as:  LOPRESSOR  Take 1 tablet (25 mg total) by mouth 2 (two) times daily.     mirtazapine 15 MG tablet  Commonly known as:  REMERON  Take 1 tablet (15 mg total) by mouth at bedtime.     Potassium Chloride ER 20 MEQ Tbcr  Take 20 mEq by mouth daily as needed (when taking Lasix).     thiamine 100 MG tablet  Take 1 tablet (100 mg total) by mouth daily.       Allergies  Allergen Reactions  . Other     NO BLOOD PRODUCTS(see blood/blood product refusal consent) pt in agreement with albumin   . Ace Inhibitors Other (See Comments)    REACTION: Angioedema  . Aspirin Other (See Comments)    Nose bleeds   . Lisinopril Swelling and Other (See Comments)    REACTION: Facial Swelling  . Rocephin [Ceftriaxone Sodium In Dextrose] Hives    Previously tolerated PCN and Ceph 2/14 Hives with ceftriaxone dose 2/15 No reaction with benadryl/ceftriaxone 2/16 Tolerating Zosyn w/o  reaction      The results of significant diagnostics from this hospitalization (including imaging, microbiology, ancillary and laboratory) are listed below for reference.    Significant Diagnostic Studies: Dg Chest 2 View  06/19/2015  CLINICAL DATA:  Chest pain, shortness of breath and cough for 1 week, hypertension, former smoker, cirrhosis, paroxysmal atrial fibrillation EXAM: CHEST  2 VIEW COMPARISON:  06/01/2015 FINDINGS: Normal heart size, mediastinal contours, and pulmonary vascularity. Minimal atherosclerotic calcification and elongation of thoracic aorta. Lungs appear hyperinflated with chronic elevation of RIGHT diaphragm. No acute infiltrate, pleural effusion or pneumothorax. Probable LEFT nipple shadow, seen external to the costal margin on prior exam. No acute osseous findings. IMPRESSION: Probable COPD changes with chronic elevation of RIGHT diaphragm. No acute abnormalities. Electronically Signed   By: Lavonia Dana M.D.   On: 06/19/2015 16:00   Ct Hip Right Wo Contrast  06/21/2015  CLINICAL DATA:  Status post subcapital right hip fracture 06/01/2015. The fracture has not been repaired secondary to the patient's anemia. Increasing right hip pain and decreasing hemoglobin. Question hemorrhage. Subsequent encounter. EXAM: CT OF THE RIGHT HIP WITHOUT CONTRAST TECHNIQUE: Multidetector CT imaging of the right hip was performed according to the standard protocol. Multiplanar CT image reconstructions were also generated. COMPARISON:  Plain films the right hip 06/01/2015. FINDINGS: As seen comparison plain films, the patient has a subcapital right hip fracture. The distal fragment demonstrates mild anterior displacement and anterior angulation of approximately 15 degrees. The femur also shows superior displacement of approximately 1.6 cm. The appearance is not grossly changed compared to the prior plain films. No new fracture is identified. The femoral head is located. There is some subcutaneous  edema present about the right buttock and hip. Small to moderate right hip joint effusion associated with the patient's fracture is noted. A moderate to moderately large volume of simple appearing fluid is seen in the right trochanteric bursa. No hematoma is identified. Imaged musculature is intact. Imaged intrapelvic  contents show a Foley catheter in place. Iliac atherosclerosis is noted. IMPRESSION: Negative for hematoma. Moderately large volume of simple appearing fluid in the right trochanteric bursa. Subcapital right hip fracture does not appear markedly changed compared to the prior plain films. Electronically Signed   By: Inge Rise M.D.   On: 06/21/2015 14:27   Dg Chest Portable 1 View  06/01/2015  CLINICAL DATA:  Fall when right hip fracture. Preoperative respiratory exam. EXAM: PORTABLE CHEST 1 VIEW COMPARISON:  12/18/2014 FINDINGS: Stable chronic lung disease and elevation of the right hemidiaphragm. There is no evidence of pulmonary edema, consolidation, pneumothorax, nodule or pleural fluid. The heart size and mediastinal contours are within normal limits. IMPRESSION: Stable chronic lung disease.  No acute findings. Electronically Signed   By: Aletta Edouard M.D.   On: 06/01/2015 09:51   Dg Hip Unilat With Pelvis 2-3 Views Right  06/01/2015  CLINICAL DATA:  Pt c/o severe hip pain x 3hrs today s/p "sat down hard" when walking with his roller-walker this morning and slipped. EXAM: DG HIP (WITH OR WITHOUT PELVIS) 2-3V RIGHT COMPARISON:  04/10/2015 FINDINGS: There is a fracture of the right femoral neck, mid cervical, mildly comminuted and displaced, with the distal fracture component displacing superiorly by approximately 1 cm. There is no significant varus or valgus angulation. There is mild apex anterior angulation. Bones are diffusely demineralized. No other fractures. Hip joints are normally aligned. IMPRESSION: Mildly displaced fracture of the right femoral neck. Electronically Signed    By: Lajean Manes M.D.   On: 06/01/2015 08:36   Dg Femur, Min 2 Views Right  06/01/2015  CLINICAL DATA:  One hip pain for 3 hours today after sitting. EXAM: RIGHT FEMUR 2 VIEWS COMPARISON:  None. FINDINGS: The proximal right femur is captured on today' s hip radiographs, and appears to show a femoral neck fracture. No femoral shaft or distal femoral fracture is identified. The distal margin of the right lateral femoral condyle is not entirely included on the frontal view and both views are somewhat oblique. Bony demineralization is present. I cannot exclude a small knee effusion. Degenerative spurring in the knee. IMPRESSION: 1. Osteoarthritis in the knee along with bony demineralization. 2. The proximal femur is shown on the hip radiographs and demonstrates a femoral neck fracture. Electronically Signed   By: Van Clines M.D.   On: 06/01/2015 08:38    Microbiology: Recent Results (from the past 240 hour(s))  Urine culture     Status: None   Collection Time: 06/19/15  2:55 PM  Result Value Ref Range Status   Specimen Description URINE, RANDOM  Final   Special Requests NONE  Final   Culture MULTIPLE SPECIES PRESENT, SUGGEST RECOLLECTION  Final   Report Status 06/20/2015 FINAL  Final  MRSA PCR Screening     Status: None   Collection Time: 06/19/15 10:43 PM  Result Value Ref Range Status   MRSA by PCR NEGATIVE NEGATIVE Final    Comment:        The GeneXpert MRSA Assay (FDA approved for NASAL specimens only), is one component of a comprehensive MRSA colonization surveillance program. It is not intended to diagnose MRSA infection nor to guide or monitor treatment for MRSA infections.      Labs: Basic Metabolic Panel:  Recent Labs Lab 06/20/15 0313 06/21/15 0628 06/22/15 0520 06/23/15 0546 06/24/15 1100  NA 135 135 137 139 138  K 3.6 3.4* 4.1 3.7 3.8  CL 107 108 111 113* 112*  CO2 23  20* 20* 19* 21*  GLUCOSE 114* 97 90 91 102*  BUN '14 12 13 14 16  '$ CREATININE 1.03  0.97 1.00 1.10 1.05  CALCIUM 7.9* 8.1* 8.1* 8.2* 8.4*   Liver Function Tests:  Recent Labs Lab 06/20/15 0313  AST 19  ALT 12*  ALKPHOS 388*  BILITOT 0.7  PROT 5.6*  ALBUMIN 1.4*   No results for input(s): LIPASE, AMYLASE in the last 168 hours. No results for input(s): AMMONIA in the last 168 hours. CBC:  Recent Labs Lab 06/19/15 1658  06/21/15 0628 06/21/15 1444 06/22/15 0520 06/23/15 0546 06/24/15 1100  WBC 23.3*  < > 19.2* 18.6* 17.1* 18.7* 17.7*  NEUTROABS 18.3*  --   --   --   --   --   --   HGB 7.5*  < > 6.8* 6.7* 6.1* 6.0* 6.0*  HCT 22.8*  < > 20.2* 19.4* 18.3* 17.9* 18.1*  MCV 85.4  < > 85.6 85.8 85.9 86.5 86.6  PLT 836*  < > 653* 675* 624* 615* 587*  < > = values in this interval not displayed. Cardiac Enzymes: No results for input(s): CKTOTAL, CKMB, CKMBINDEX, TROPONINI in the last 168 hours. BNP: BNP (last 3 results)  Recent Labs  09/02/14 1526  BNP 431.0*    ProBNP (last 3 results) No results for input(s): PROBNP in the last 8760 hours.  CBG: No results for input(s): GLUCAP in the last 168 hours.     SignedDebbe Odea, MD [facility] 06/25/2015, 12:18 PM

## 2015-06-25 NOTE — Clinical Social Work Placement (Signed)
   CLINICAL SOCIAL WORK PLACEMENT  NOTE  Date:  06/25/2015  Patient Details  Name: Bob Wilson MRN: [id number] Date of Birth: September 05, 1932  Clinical Social Work is seeking post-discharge placement for this patient at the [place] level of care (*CSW will initial, date and re-position this form in  chart as items are completed):  Yes   Patient/family provided with [place] Work Department's list of facilities offering this level of care within the geographic area requested by the patient (or if unable, by the patient's family).  Yes   Patient/family informed of their freedom to choose among providers that offer the needed level of care, that participate in Medicare, Medicaid or managed care program needed by the patient, have an available bed and are willing to accept the patient.  Yes   Patient/family informed of [place]'s ownership interest in [facility] and [facility], as well as of the fact that they are under no obligation to receive care at these facilities.  PASRR submitted to EDS on       PASRR number received on       Existing PASRR number confirmed on 06/25/15     FL2 transmitted to all facilities in geographic area requested by pt/family on       FL2 transmitted to all facilities within larger geographic area on       Patient informed that his/her managed care company has contracts with or will negotiate with certain facilities, including the following:        Yes   Patient/family informed of bed offers received.  Patient chooses bed at [facility]     Physician recommends and patient chooses bed at      Patient to be transferred to [facility] on 06/25/15.  Patient to be transferred to facility by [facility]     Patient family notified on 06/25/15 of transfer.  Name of family member notified:  Wife, Deneise Lever     PHYSICIAN       Additional Comment:     _______________________________________________ Benard Halsted, [place] 06/25/2015, 1:48 PM

## 2015-06-25 NOTE — Progress Notes (Addendum)
CSW originally planned to arrange for pt to return to SNF at [facility]. CSW was contacted today by patient's family to check w[name]. [facility] to offer a long-term care bed, so pt is now returning to [facility].  Dtr requested FL2 to submit for Medicaid. CSW provided her w/ FL2 copy.  Percell Locus Dmitriy Gair LCSWA [phone]

## 2015-06-25 NOTE — NC FL2 (Signed)
[place] LEVEL OF CARE SCREENING TOOL     IDENTIFICATION  Patient Name: Bob Wilson Birthdate: [birth year] Sex: male Admission Date (Current Location): 06/19/2015  [facility] and [place] Number: Herbalist and Address:  [facility] [place], [place] [address], [place], [place] [address]      Provider Number: [id number]  Attending Physician Name and Address:  Debbe Odea, MD  Relative Name and Phone Number:  Basem Yannuzzi, [phone]    Current Level of Care: Hospital Recommended Level of Care: [place] Prior Approval Number:    Date Approved/Denied:   PASRR Number:    Discharge Plan: SNF    Current Diagnoses: Patient Active Problem List   Diagnosis Date Noted  . Deficiency anemia- Iron, folic acid and Y77 deficiency  06/25/2015  . Folic acid deficiency 41/28/7867  . Decubitus ulcer of sacral area   . Anorexia 06/10/2015  . Anemia, chronic disease 06/03/2015  . Femoral neck fracture, right, closed, initial encounter 06/01/2015  . Hip fracture ([place]) 06/01/2015  . Chronic indwelling Foley catheter 04/12/2015  . 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 Persistent atrial fibrillation ([place]) 11/05/2014  . Bilateral lower extremity edema   . Complicated UTI (urinary tract infection)-indwelling Foley catheter. 09/02/2014  . Hypokalemia 09/02/2014  . Atrial fibrillation with RVR ([place]) 09/02/2014  . Sepsis secondary to UTI ([place]) 09/02/2014  . Protein-calorie malnutrition ([place]) 08/15/2013  . Cirrhosis, alcoholic ([place]) 67/20/9470  . 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 Empyema of pleura ([place]) 01/05/2012  . Nodule of left lung 01/05/2012  . Continuous chronic alcoholism ([place]) 06/25/2011  . Medically noncompliant 06/25/2011  . PROSTATE SPECIFIC ANTIGEN, ELEVATED 08/07/2010  . INGUINAL HERNIA, RIGHT 07/11/2010  . GOUT 02/11/2009  . Essential hypertension 02/11/2009  . Dyslipidemia 05/21/2008  . ANEMIA-NOS 05/21/2008  . GERD  05/21/2008  . DEGENERATIVE JOINT DISEASE, KNEE 05/21/2008    Orientation ACTIVITIES/SOCIAL BLADDER RESPIRATION    Self, Time, Situation, Place  Family supportive Continent (Urinary Catheter) Normal  BEHAVIORAL SYMPTOMS/MOOD NEUROLOGICAL BOWEL NUTRITION STATUS      Incontinent  (See DC Summary)  PHYSICIAN VISITS COMMUNICATION OF NEEDS Height & Weight Skin    Verbally   150 lbs. PU Stage and Appropriate Care (1 Stage 2 PU on back; 1 stage 2 PU on sacrum)          AMBULATORY STATUS RESPIRATION    Assist extensive Normal      Personal Care Assistance Level of Assistance  Bathing, Feeding, Dressing Bathing Assistance: Maximum assistance Feeding assistance: Independent Dressing Assistance: Maximum assistance      Functional Limitations Info                SPECIAL CARE FACTORS FREQUENCY  PT (By licensed PT)     PT Frequency: 5x/week             Additional Factors Info  Code Status, Allergies, Psychotropic Code Status Info: Full Allergies Info: NO BLOOD PRODUCTS (Jehovah's Witness), Ace Inhibitors, Aspirin, Lisinopril, Rocephin Ceftriaxone Sodium In Dextrosetaken  Psychotropic Info: Ativan         Current Medications (06/25/2015):  This is the current hospital active medication list Current Facility-Administered Medications  Medication Dose Route Frequency Provider Last Rate Last Dose  . acetaminophen (TYLENOL) tablet 650 mg  650 mg Oral Q6H PRN Debbe Odea, MD       Or  . acetaminophen (TYLENOL) suppository 650 mg  650 mg Rectal Q6H PRN Debbe Odea, MD      .  apixaban (ELIQUIS) tablet 5 mg  5 mg Oral BID Cassie Jodean Lima, RPH   5 mg at 06/25/15 0955  . colchicine tablet 0.6 mg  0.6 mg Oral QHS Debbe Odea, MD   0.6 mg at 06/24/15 2210  . diltiazem (CARDIZEM CD) 24 hr capsule 240 mg  240 mg Oral Daily Debbe Odea, MD   240 mg at 06/25/15 0955  . dronabinol (MARINOL) capsule 2.5 mg  2.5 mg Oral QAC lunch Debbe Odea, MD   2.5 mg at 06/24/15 1140  . feeding  supplement (ENSURE ENLIVE) (ENSURE ENLIVE) liquid 237 mL  237 mL Oral TID BM Debbe Odea, MD   237 mL at 06/25/15 0955  . fentaNYL (SUBLIMAZE) injection 50 mcg  50 mcg Intravenous Q2H PRN Elnora Morrison, MD   50 mcg at 06/20/15 1332  . folic acid (FOLVITE) tablet 3 mg  3 mg Oral Daily Debbe Odea, MD   3 mg at 06/25/15 0955  . HYDROcodone-acetaminophen (NORCO) 10-325 MG per tablet 1 tablet  1 tablet Oral Q4H Debbe Odea, MD   1 tablet at 06/25/15 0755  . lactulose (CHRONULAC) 10 GM/15ML solution 20 g  20 g Oral Daily Debbe Odea, MD   20 g at 06/25/15 0955  . levofloxacin (LEVAQUIN) tablet 750 mg  750 mg Oral q1800 Debbe Odea, MD   750 mg at 06/24/15 1801  . metaxalone (SKELAXIN) tablet 800 mg  800 mg Oral TID Debbe Odea, MD   800 mg at 06/25/15 0959  . metoprolol tartrate (LOPRESSOR) tablet 25 mg  25 mg Oral BID Debbe Odea, MD   25 mg at 06/25/15 0956  . mirtazapine (REMERON) tablet 15 mg  15 mg Oral QHS Debbe Odea, MD   15 mg at 06/24/15 2211  . ondansetron (ZOFRAN) tablet 4 mg  4 mg Oral Q6H PRN Debbe Odea, MD       Or  . ondansetron (ZOFRAN) injection 4 mg  4 mg Intravenous Q6H PRN Debbe Odea, MD   4 mg at 06/22/15 1146  . thiamine (VITAMIN B-1) tablet 100 mg  100 mg Oral Daily Debbe Odea, MD   100 mg at 06/25/15 1610     Discharge Medications: Please see discharge summary for a list of discharge medications.  Relevant Imaging Results:  Relevant Lab Results:  Recent Labs    Additional [place] #: [id number]  Benard Halsted, [place]

## 2015-06-25 NOTE — Progress Notes (Signed)
Patient will DC to: [facility] Anticipated DC date: 06/25/15 Family notified: Wife, at bedside, and daughter over phone. Transport by: Corey Harold  CSW signing off.  Cedric Fishman, [place] Social Worker [phone]

## 2015-06-28 ENCOUNTER — Non-Acute Institutional Stay (SKILLED_NURSING_FACILITY): Payer: Medicare Other | Admitting: Adult Health

## 2015-06-28 DIAGNOSIS — E46 Unspecified protein-calorie malnutrition: Secondary | ICD-10-CM

## 2015-06-28 DIAGNOSIS — M109 Gout, unspecified: Secondary | ICD-10-CM

## 2015-06-28 DIAGNOSIS — D539 Nutritional anemia, unspecified: Secondary | ICD-10-CM | POA: Diagnosis not present

## 2015-06-28 DIAGNOSIS — S72001S Fracture of unspecified part of neck of right femur, sequela: Secondary | ICD-10-CM

## 2015-06-28 DIAGNOSIS — R630 Anorexia: Secondary | ICD-10-CM

## 2015-06-28 DIAGNOSIS — N312 Flaccid neuropathic bladder, not elsewhere classified: Secondary | ICD-10-CM

## 2015-06-28 DIAGNOSIS — Z9289 Personal history of other medical treatment: Secondary | ICD-10-CM | POA: Diagnosis not present

## 2015-06-28 DIAGNOSIS — I4891 Unspecified atrial fibrillation: Secondary | ICD-10-CM

## 2015-06-28 DIAGNOSIS — R600 Localized edema: Secondary | ICD-10-CM | POA: Diagnosis not present

## 2015-06-28 DIAGNOSIS — K7030 Alcoholic cirrhosis of liver without ascites: Secondary | ICD-10-CM | POA: Diagnosis not present

## 2015-06-28 DIAGNOSIS — Z978 Presence of other specified devices: Secondary | ICD-10-CM

## 2015-06-28 DIAGNOSIS — I10 Essential (primary) hypertension: Secondary | ICD-10-CM | POA: Diagnosis not present

## 2015-06-28 DIAGNOSIS — Z960 Presence of urogenital implants: Secondary | ICD-10-CM

## 2015-07-02 ENCOUNTER — Encounter: Payer: Self-pay | Admitting: Internal Medicine

## 2015-07-02 ENCOUNTER — Encounter: Payer: Self-pay | Admitting: Adult Health

## 2015-07-02 ENCOUNTER — Non-Acute Institutional Stay (SKILLED_NURSING_FACILITY): Payer: Medicare Other | Admitting: Internal Medicine

## 2015-07-02 DIAGNOSIS — S72001A Fracture of unspecified part of neck of right femur, initial encounter for closed fracture: Secondary | ICD-10-CM

## 2015-07-02 DIAGNOSIS — K7030 Alcoholic cirrhosis of liver without ascites: Secondary | ICD-10-CM

## 2015-07-02 DIAGNOSIS — R52 Pain, unspecified: Secondary | ICD-10-CM | POA: Diagnosis not present

## 2015-07-02 DIAGNOSIS — I10 Essential (primary) hypertension: Secondary | ICD-10-CM

## 2015-07-02 DIAGNOSIS — R630 Anorexia: Secondary | ICD-10-CM | POA: Diagnosis not present

## 2015-07-02 DIAGNOSIS — I480 Paroxysmal atrial fibrillation: Secondary | ICD-10-CM

## 2015-07-02 DIAGNOSIS — D638 Anemia in other chronic diseases classified elsewhere: Secondary | ICD-10-CM

## 2015-07-02 DIAGNOSIS — E46 Unspecified protein-calorie malnutrition: Secondary | ICD-10-CM | POA: Diagnosis not present

## 2015-07-02 DIAGNOSIS — Z960 Presence of urogenital implants: Secondary | ICD-10-CM

## 2015-07-02 DIAGNOSIS — Z9289 Personal history of other medical treatment: Secondary | ICD-10-CM

## 2015-07-02 DIAGNOSIS — Z978 Presence of other specified devices: Secondary | ICD-10-CM

## 2015-07-02 NOTE — Progress Notes (Signed)
Patient ID: Bob Wilson, male   DOB: 07/10/1933, 81 y.o.   MRN: [id number]    Facility: [facility]      Allergies  Allergen Reactions  . Other     NO BLOOD PRODUCTS(see blood/blood product refusal consent) pt in agreement with albumin   . Ace Inhibitors Other (See Comments)    REACTION: Angioedema  . Aspirin Other (See Comments)    Nose bleeds   . Lisinopril Swelling and Other (See Comments)    REACTION: Facial Swelling  . Rocephin [Ceftriaxone Sodium In Dextrose] Hives    Previously tolerated PCN and Ceph 2/14 Hives with ceftriaxone dose 2/15 No reaction with benadryl/ceftriaxone 2/16 Tolerating Zosyn w/o reaction    Chief Complaint  Patient presents with  . Hospitalization Follow-up    HPI:  He is a resident of this facility who was hospitalized due to sepsis from an UTI with culture of multiple bacteria. He has a right femur fracture; he has not a surgical repair done due to his anemia. He will not accept blood transfusions. He will need his H/H monitored.    Past Medical History  Diagnosis Date  . Anemia     NOS  . Arthritis   . Gout   . Hyperlipidemia   . Hypertension   . GERD (gastroesophageal reflux disease)   . Elevated PSA     multiple times, refused urology eval [facility] notes)  . ETOH abuse   . Medically noncompliant     noncompliant with follow ups, labs.  . Alcoholism ([place]) 06/25/2011  . Cirrhosis, alcoholic ([place]) 3/41/9379  . 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 Paroxysmal atrial fibrillation (HCC)   . Bilateral lower extremity edema     Chronic.  Marland Kitchen History of empyema of pleura 12/2011    s/p VATS decortication  . Dysrhythmia     hx of PAF  . Foley catheter in place   . Falls frequently   . 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 Chronic indwelling Foley catheter 04/12/2015    Past Surgical History  Procedure Laterality Date  . Lipoma excision    . Transthoracic echocardiogram  09/03/2014    Normal overall LV size. EF 55-60% with no regional  W. MA. Trivial AI. Mild-moderate MR. Mild R. and LA dilation. Mild to moderate TR. PA pressures ~47 mmHg  . Video assisted thoracoscopy (vats)/empyema  12/2011  . Nm myoview ltd  10/12/14    LOW RISK - NO ISCHEMIA, DIAPHRAGMATIC ATTENUATION, NON-GATED  . Sameria Morss light laser turp (transurethral resection of prostate N/A 12/21/2014    Procedure: Audi [place] LIGHT LASER TURP (TRANSURETHRAL RESECTION OF PROSTATE;  Surgeon: Ardis Hughs, MD;  Location: [facility];  Service: Urology;  Laterality: N/A;    VITAL SIGNS BP 151/69 mmHg  Pulse 76  Ht $R'6\' 4"'sn$  (1.93 m)  Wt 163 lb (73.936 kg)  BMI 19.85 kg/m2  SpO2 98%  Patient's Medications  New Prescriptions   No medications on file  Previous Medications   APIXABAN (ELIQUIS) 5 MG TABS TABLET    Take 1 tablet (5 mg total) by mouth 2 (two) times daily.   COLCHICINE 0.6 MG TABLET    Take 0.6 mg by mouth at bedtime.   CYANOCOBALAMIN (,VITAMIN B-12,) 1000 MCG/ML INJECTION    1000 mcg [place] daily for 7 days from 06/05/15, then, 1000 mcg [place] weekly for 1 month, then, 1000 mcg [place] monthly.   DARBEPOETIN ALFA (ARANESP, ALBUMIN FREE,) 500 MCG/ML SOSY INJECTION    Inject 1 mL (  500 mcg total) into the skin every 21 ( twenty-one) days.   DILTIAZEM (CARDIZEM CD) 240 MG 24 HR CAPSULE    Take 1 capsule (240 mg total) by mouth daily.   DRONABINOL (MARINOL) 2.5 MG CAPSULE    Take 1 capsule (2.5 mg total) by mouth daily before lunch.   FEEDING SUPPLEMENT, ENSURE ENLIVE, (ENSURE ENLIVE) LIQD    Take 237 mLs by mouth 3 (three) times daily between meals.   FOLIC ACID (FOLVITE) 1 MG TABLET    Take 3 tablets (3 mg total) by mouth daily.   FUROSEMIDE (LASIX) 40 MG TABLET    Take 1 tablet (40 mg total) by mouth daily as needed for fluid or edema.   HYDROCODONE-ACETAMINOPHEN (NORCO) 10-325 MG TABLET    Take 1 tablet by mouth every 4 (four) hours.   LACTULOSE (CHRONULAC) 10 GM/15ML SOLUTION    Take 30 mLs (20 g total) by mouth daily.   METAXALONE (SKELAXIN) 800 MG TABLET    Take 1 tablet  (800 mg total) by mouth 3 (three) times daily.   METOPROLOL TARTRATE (LOPRESSOR) 25 MG TABLET    Take 1 tablet (25 mg total) by mouth 2 (two) times daily.   MIRTAZAPINE (REMERON) 15 MG TABLET    Take 1 tablet (15 mg total) by mouth at bedtime.   POTASSIUM CHLORIDE ER 20 MEQ TBCR    Take 20 mEq by mouth daily as needed (when taking Lasix).   THIAMINE 100 MG TABLET    Take 1 tablet (100 mg total) by mouth daily.  Modified Medications   No medications on file  Discontinued Medications   No medications on file     SIGNIFICANT DIAGNOSTIC EXAMS   06-01-15: right femur x-ray: . Osteoarthritis in the knee along with bony demineralization. 2. The proximal femur is shown on the hip radiographs and demonstrates a femoral neck fracture.  06-01-15: bilateral hip and pelvis x-ray: Mildly displaced fracture of the right femoral neck.   06-01-15: chest x-ray: Stable chronic lung disease.  No acute findings.  06-19-15: chest x-ray: Probable COPD changes with chronic elevation of RIGHT diaphragm. No acute abnormalities.  06-21-15: ct of right hip: Negative for hematoma. Moderately large volume of simple appearing fluid in the right trochanteric bursa. Subcapital right hip fracture does not appear markedly changed compared to the prior plain films.     LABS REVIEWED:   06-01-15: wbc 8.9; hgb 8.6; hct 24.5; mcv 79.8; plt 234; glucose 78; bun 12; creat 1.49; k+ 2.9; na++126; iron 164; tibc 190; ferritin 373; vit b12; 218; folate 4.8 06-02-15: wbc 6.9; hgb 7.4; hct 20.7; mcv 81.5; plt 176; glucose 81; bun 11; creat 1.31; k+3.5; na++130; mag 1.3 06-03-15: hgb 7.2; hct 20.4; glucose 115; bun 10; creat 1.12; k+ 3.0; na++130; mag 2.6 06-05-15: hgb 7.2; hct 20.1; glucose 93; bun 15; creat 1.40; k+ 3.9; na++132;  06-06-15: wbc 16.5; hgb 7.2; hct 21.9; mcv 89.0; plt 290; glucose 66; bun 18; creat 1.31; k+ 5.0; na++135  06-18-15: wbc 18.0; hgb 7.1; hct 21.0; mcv 86.4; plt 818; glucose 80; bun 20; creat 1.11;  k+ 4.6; na++132; total protein 7.7; albumin 2.0  06-19-15: wbc 23.3; hgb 7.5; hct 22.8; mcv 85.4; plt 836; glucose 106; bun 16; creat 1.13; k+ 4.1; na++135; urine culture: multiple species 06-20-15: wbc 18.7; hgb 7.0; hct 20.9; mcv 85.7; plt 721; glucose 114; bun 14; creat 1.03; k+ 3.6; na++135; ast 12 alt 19; alk phos 388; albumin 1.4 06-21-15: EPO 30.3 06-22-15: tsh 2.615  06-24-15: wbc 17.7; hgb 6.0; hct 18.1; mcv 86.6; plt 587; glucose 102; bun 16; creat 1.05; k+ 3.8; na++138     Review of Systems  Unable to perform ROS: mental acuity   Physical Exam  Constitutional: No distress.  Thin   Eyes: Conjunctivae are normal.  Neck: Neck supple. No JVD present. No thyromegaly present.  Cardiovascular: Normal rate, regular rhythm and intact distal pulses.   Respiratory: Effort normal and breath sounds normal. No respiratory distress. He has no wheezes.  GI: Soft. Bowel sounds are normal. He exhibits no distension. There is no tenderness.  Genitourinary:  Has long term foley   Musculoskeletal: He exhibits no edema.  Is status post right femur fracture Does not voluntarily move lower extremities Bilateral elbows are red; warm and tender to touch   Lymphadenopathy:    He has no cervical adenopathy.  Neurological: He is alert.  Skin: Skin is warm and dry. He is not diaphoretic.  Psychiatric: He has a normal mood and affect.     ASSESSMENT/ PLAN:  1. Right femur fracture: will continue to be followed by orthopedics and will have therapy as directed will continue vicodin 10/325 mg every 4 hours and will monitor  2. Anemia: will continue aranesp 500 mcg SQ every 21 days (this will be done outpatient) will receive feraheme injections with oncology will continue vit B12 injections and will continue folic acid   3.  Afib: heart rate is stable will continue eliquis 5 mg twice daily and will continue cardizem LA 240 mg daily for rate control   4. Hypertension: will continue cardizem LA 240 mg  daily and lopressor 25 mg twice daily   5. Gout: no recent flares present will continue colchicine 0.6 mg daily   6. Alcoholic cirrhosis: will continue lactulose 30 cc daily   7. Bilateral lower extremity edema: will continue lasix 40 mg daily with k+ 20 meq daily   8. Protein calorie malnutrition: will continue supplements per facility protocol  his current weight is 163 pounds will continue remeron 15 mg nightly  9. Hypotonic bladder: has chronic foley    Time spent with patient 50   minutes >50% time spent counseling; reviewing medical record; tests; labs; and developing future plan of care     Ok Edwards NP [facility]  Contact [phone] Monday through Friday 8am- 5pm  After hours call [phone]

## 2015-07-02 NOTE — Progress Notes (Signed)
Patient ID: Bob Wilson, male   DOB: 1933/05/29, 81 y.o.   MRN: [id number]    HISTORY AND PHYSICAL   DATE: 07/02/15  Location:  [place] [facility] Service: SNF [phone])   Extended Emergency Contact Information Primary Emergency Contact: Bob, Wilson Address: [address]          [place], [place] [address] [name] of [place] Phone: [phone] Relation: Spouse  Advanced Directive information  FULL CODE  Chief Complaint  Patient presents with  . Readmit To SNF    HPI:  81 yo male seen today as a readmission into SNF following hospital stay for sepsis due to UTI, deficiency anemia (iron, foalte and B12), protein calorie malnutrition, Etoh cirrhosis, sacral decubitus ulcer, right femoral neck fx. He is a Jehovah witness. He has a chronic indwelling foley cath. He was tx with IV levaquin and vanco. He had afib with RVR and metoprolol BID added to cardizem. eleiquis started as he is currently immoble die to hip fx. Anemia tx with iron infusion and aranesp. Air mattress recommended due to sacral ulcer  HTN - BP controlled on metoprolol and lasix  Etoh abuse hx - takes thiamine and folate  Past Medical History  Diagnosis Date  . Anemia     NOS  . Arthritis   . Gout   . Hyperlipidemia   . Hypertension   . GERD (gastroesophageal reflux disease)   . Elevated PSA     multiple times, refused urology eval [facility] notes)  . ETOH abuse   . Medically noncompliant     noncompliant with follow ups, labs.  . Alcoholism ([place]) 06/25/2011  . Cirrhosis, alcoholic ([place]) 9/67/8938  . 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 Paroxysmal atrial fibrillation (HCC)   . Bilateral lower extremity edema     Chronic.  Marland Kitchen History of empyema of pleura 12/2011    s/p VATS decortication  . Dysrhythmia     hx of PAF  . Foley catheter in place   . Falls frequently   . 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 Chronic indwelling Foley catheter 04/12/2015    Past Surgical  History  Procedure Laterality Date  . Lipoma excision    . Transthoracic echocardiogram  09/03/2014    Normal overall LV size. EF 55-60% with no regional W. MA. Trivial AI. Mild-moderate MR. Mild R. and LA dilation. Mild to moderate TR. PA pressures ~47 mmHg  . Video assisted thoracoscopy (vats)/empyema  12/2011  . Nm myoview ltd  10/12/14    LOW RISK - NO ISCHEMIA, DIAPHRAGMATIC ATTENUATION, NON-GATED  . Green light laser turp (transurethral resection of prostate N/A 12/21/2014    Procedure: GREEN LIGHT LASER TURP (TRANSURETHRAL RESECTION OF PROSTATE;  Surgeon: Bob Hughs, MD;  Location: [facility];  Service: Urology;  Laterality: N/A;    Patient Care Team: Bob Loffler, MD as PCP - General Bob Basques, MD as Consulting Physician (Infectious Diseases) Bob Lever, MD as Consulting Physician (Pulmonary Disease)  Social History   Social History  . Marital Status: Married    Spouse Name: N/A  . Number of Children: 2  . Years of Education: N/A   Occupational History  . former Engineer, building services     retired   Social History Main Topics  . Smoking status: Former Smoker -- 0.25 packs/day for 29 years    Types: Cigarettes    Quit date: 07/20/1974  . Smokeless tobacco: Never Used  Comment: Remote- quit 45 years ago.  . Alcohol Use: 0.0 oz/week    0 Standard drinks or equivalent per week     Comment: states that he drinks "couple pints of gin" every day.  nnow has only had maybe one shot of June in the last few weeks.  . Drug Use: No  . Sexual Activity: Not Currently   Other Topics Concern  . Not on file   Social History Narrative   Chronic Alcoholic, severe   Jehovah's Witness         He does not exercise much because of unsteady gait.    He does have a walker and use range of motion at least 2 days a week with physical therapy.      Now usually using a motorized WC      Used to be a Dealer     reports that he quit smoking about 40 years ago. His smoking  use included Cigarettes. He has a 7.25 pack-year smoking history. He has never used smokeless tobacco. He reports that he drinks alcohol. He reports that he does not use illicit drugs.  Family History  Problem Relation Age of Onset  . Diabetes Mellitus II Father   . Stroke Mother    Family Status  Relation Status Death Age  . Mother Deceased   . Father Deceased   . Sister Alive   . Brother Alive   . Brother Alive   . Brother Alive   . Brother Alive   . Sister Deceased   . Sister Deceased   . Sister Alive   . Son Alive   . Son Alive     There is no immunization history for the selected administration types on file for this patient.  Allergies  Allergen Reactions  . Other     NO BLOOD PRODUCTS(see blood/blood product refusal consent) pt in agreement with albumin   . Ace Inhibitors Other (See Comments)    REACTION: Angioedema  . Aspirin Other (See Comments)    Nose bleeds   . Lisinopril Swelling and Other (See Comments)    REACTION: Facial Swelling  . Rocephin [Ceftriaxone Sodium In Dextrose] Hives    Previously tolerated PCN and Ceph 2/14 Hives with ceftriaxone dose 2/15 No reaction with benadryl/ceftriaxone 2/16 Tolerating Zosyn w/o reaction    Medications: Patient's Medications  New Prescriptions   No medications on file  Previous Medications   APIXABAN (ELIQUIS) 5 MG TABS TABLET    Take 1 tablet (5 mg total) by mouth 2 (two) times daily.   COLCHICINE 0.6 MG TABLET    Take 0.6 mg by mouth at bedtime.   CYANOCOBALAMIN (,VITAMIN B-12,) 1000 MCG/ML INJECTION    1000 mcg [place] daily for 7 days from 06/05/15, then, 1000 mcg [place] weekly for 1 month, then, 1000 mcg [place] monthly.   DARBEPOETIN ALFA (ARANESP, ALBUMIN FREE,) 500 MCG/ML SOSY INJECTION    Inject 1 mL (500 mcg total) into the skin every 21 ( twenty-one) days.   DILTIAZEM (CARDIZEM CD) 240 MG 24 HR CAPSULE    Take 1 capsule (240 mg total) by mouth daily.   DRONABINOL (MARINOL) 2.5 MG CAPSULE    Take 1 capsule (2.5 mg  total) by mouth daily before lunch.   FEEDING SUPPLEMENT, ENSURE ENLIVE, (ENSURE ENLIVE) LIQD    Take 237 mLs by mouth 3 (three) times daily between meals.   FOLIC ACID (FOLVITE) 1 MG TABLET    Take 3 tablets (3 mg total) by mouth  daily.   FUROSEMIDE (LASIX) 40 MG TABLET    Take 1 tablet (40 mg total) by mouth daily as needed for fluid or edema.   HYDROCODONE-ACETAMINOPHEN (NORCO) 10-325 MG TABLET    Take 1 tablet by mouth every 4 (four) hours.   LACTULOSE (CHRONULAC) 10 GM/15ML SOLUTION    Take 30 mLs (20 g total) by mouth daily.   METAXALONE (SKELAXIN) 800 MG TABLET    Take 1 tablet (800 mg total) by mouth 3 (three) times daily.   METOPROLOL TARTRATE (LOPRESSOR) 25 MG TABLET    Take 1 tablet (25 mg total) by mouth 2 (two) times daily.   MIRTAZAPINE (REMERON) 15 MG TABLET    Take 1 tablet (15 mg total) by mouth at bedtime.   POTASSIUM CHLORIDE ER 20 MEQ TBCR    Take 20 mEq by mouth daily as needed (when taking Lasix).   THIAMINE 100 MG TABLET    Take 1 tablet (100 mg total) by mouth daily.  Modified Medications   No medications on file  Discontinued Medications   No medications on file    Review of Systems  Unable to perform ROS: Other    Filed Vitals:   07/02/15 1158  BP: 105/68  Pulse: 78  Temp: 97.6 F (36.4 C)  Weight: 163 lb (73.936 kg)  SpO2: 98%   Body mass index is 19.85 kg/(m^2).  Physical Exam  Constitutional: He appears cachectic. No distress.  Lying in bed  HENT:  Mouth/Throat: Oropharynx is clear and moist. Mucous membranes are dry.  Eyes: Pupils are equal, round, and reactive to light. No scleral icterus.  Neck: Neck supple. Carotid bruit is not present. No thyromegaly present.  Cardiovascular: Normal rate, regular rhythm, normal heart sounds and intact distal pulses.  Exam reveals no gallop and no friction rub.   No murmur heard. no distal LE swelling. No calf TTP  Pulmonary/Chest: Effort normal and breath sounds normal. He has no wheezes. He has no rales. He  exhibits no tenderness.  Abdominal: Soft. Bowel sounds are normal. He exhibits no distension, no abdominal bruit, no pulsatile midline mass and no mass. There is no tenderness. There is no rebound and no guarding.  Genitourinary:  Foley DTG, clear yellow urine  Musculoskeletal: He exhibits edema and tenderness.  Right hip externally rotated with distal swelling. FROM toes. B/l knee joint swelling  Lymphadenopathy:    He has no cervical adenopathy.  Neurological: He is alert.  Skin: Skin is warm and dry. No rash noted.  Psychiatric: He has a normal mood and affect. His behavior is normal. Thought content normal.     Labs reviewed: Admission on 06/19/2015, Discharged on 06/25/2015  No results displayed because visit has over 200 results.  CBC Latest Ref Rng 06/24/2015 06/23/2015 06/22/2015  WBC 4.0 - 10.5 K/uL 17.7(H) 18.7(H) 17.1(H)  Hemoglobin 13.0 - 17.0 g/dL 6.0(LL) 6.0(LL) 6.1(LL)  Hematocrit 39.0 - 52.0 % 18.1(L) 17.9(L) 18.3(L)  Platelets 150 - 400 K/uL 587(H) 615(H) 624(H)    CMP Latest Ref Rng 06/24/2015 06/23/2015 06/22/2015  Glucose 65 - 99 mg/dL 102(H) 91 90  BUN 6 - 20 mg/dL '16 14 13  '$ Creatinine 0.61 - 1.24 mg/dL 1.05 1.10 1.00  Sodium 135 - 145 mmol/L 138 139 137  Potassium 3.5 - 5.1 mmol/L 3.8 3.7 4.1  Chloride 101 - 111 mmol/L 112(H) 113(H) 111  CO2 22 - 32 mmol/L 21(L) 19(L) 20(L)  Calcium 8.9 - 10.3 mg/dL 8.4(L) 8.2(L) 8.1(L)  Total Protein 6.5 - 8.1  g/dL - - -  Total Bilirubin 0.3 - 1.2 mg/dL - - -  Alkaline Phos 38 - 126 U/L - - -  AST 15 - 41 U/L - - -  ALT 17 - 63 U/L - - -       Dg Chest 2 View  06/19/2015  CLINICAL DATA:  Chest pain, shortness of breath and cough for 1 week, hypertension, former smoker, cirrhosis, paroxysmal atrial fibrillation EXAM: CHEST  2 VIEW COMPARISON:  06/01/2015 FINDINGS: Normal heart size, mediastinal contours, and pulmonary vascularity. Minimal atherosclerotic calcification and elongation of thoracic aorta. Lungs appear  hyperinflated with chronic elevation of RIGHT diaphragm. No acute infiltrate, pleural effusion or pneumothorax. Probable LEFT nipple shadow, seen external to the costal margin on prior exam. No acute osseous findings. IMPRESSION: Probable COPD changes with chronic elevation of RIGHT diaphragm. No acute abnormalities. Electronically Signed   By: Lavonia Dana M.D.   On: 06/19/2015 16:00   Ct Hip Right Wo Contrast  06/21/2015  CLINICAL DATA:  Status post subcapital right hip fracture 06/01/2015. The fracture has not been repaired secondary to the patient's anemia. Increasing right hip pain and decreasing hemoglobin. Question hemorrhage. Subsequent encounter. EXAM: CT OF THE RIGHT HIP WITHOUT CONTRAST TECHNIQUE: Multidetector CT imaging of the right hip was performed according to the standard protocol. Multiplanar CT image reconstructions were also generated. COMPARISON:  Plain films the right hip 06/01/2015. FINDINGS: As seen comparison plain films, the patient has a subcapital right hip fracture. The distal fragment demonstrates mild anterior displacement and anterior angulation of approximately 15 degrees. The femur also shows superior displacement of approximately 1.6 cm. The appearance is not grossly changed compared to the prior plain films. No new fracture is identified. The femoral head is located. There is some subcutaneous edema present about the right buttock and hip. Small to moderate right hip joint effusion associated with the patient's fracture is noted. A moderate to moderately large volume of simple appearing fluid is seen in the right trochanteric bursa. No hematoma is identified. Imaged musculature is intact. Imaged intrapelvic contents show a Foley catheter in place. Iliac atherosclerosis is noted. IMPRESSION: Negative for hematoma. Moderately large volume of simple appearing fluid in the right trochanteric bursa. Subcapital right hip fracture does not appear markedly changed compared to the prior  plain films. Electronically Signed   By: Inge Rise M.D.   On: 06/21/2015 14:27     Assessment/Plan   ICD-9-CM ICD-10-CM   1. Anorexia - multifactorial 783.0 R63.0   2. Protein-calorie malnutrition ([place]) - multifactorial 263.9 E46   3. Paroxysmal atrial fibrillation (HCC) -rate controlled 427.31 I48.0   4. Femoral neck fracture, right, closed, initial encounter - unrepaired due to anemia 820.8 S72.001A   5. Essential hypertension - stable 401.9 I10   6. Alcoholic cirrhosis of liver without ascites (HCC) - stable 571.2 K70.30   7. Foley catheter in place V45.89 Z92.89    due to hypotonic bladder  8. Anemia, chronic disease - unchanged 285.29 D63.8   9. Uncontrolled pain - due to #4 780.96 R52     Start zofran '4mg'$  po BID prn nausea  Hgb needs >8 prior to surgical consideration  Foley cath care as indicated  F/u with hematology as scheduled  Cont nutritional supplements as ordered  Cont current meds as ordered  Nonweightbearing RLE  GOAL: short term rehab and d/c home when medically appropriate. Communicated with pt and nursing.  Will follow  Yadir Zentner S. Eulas Post, D. O., F. [place]  Meda Coffee  [facility] [place], [place] [address] [phone] Cell (Monday-Friday 8 AM - 5 PM) [phone] After 5 PM and follow prompts

## 2015-07-19 DIAGNOSIS — S72001A Fracture of unspecified part of neck of right femur, initial encounter for closed fracture: Secondary | ICD-10-CM | POA: Insufficient documentation

## 2015-07-24 ENCOUNTER — Non-Acute Institutional Stay (SKILLED_NURSING_FACILITY): Payer: Medicare Other | Admitting: Adult Health

## 2015-07-24 DIAGNOSIS — I4891 Unspecified atrial fibrillation: Secondary | ICD-10-CM

## 2015-07-24 DIAGNOSIS — I10 Essential (primary) hypertension: Secondary | ICD-10-CM | POA: Diagnosis not present

## 2015-07-24 DIAGNOSIS — L89154 Pressure ulcer of sacral region, stage 4: Secondary | ICD-10-CM | POA: Diagnosis not present

## 2015-07-24 DIAGNOSIS — K7030 Alcoholic cirrhosis of liver without ascites: Secondary | ICD-10-CM

## 2015-07-24 DIAGNOSIS — M109 Gout, unspecified: Secondary | ICD-10-CM | POA: Diagnosis not present

## 2015-07-24 DIAGNOSIS — D539 Nutritional anemia, unspecified: Secondary | ICD-10-CM

## 2015-07-24 DIAGNOSIS — E46 Unspecified protein-calorie malnutrition: Secondary | ICD-10-CM

## 2015-07-24 DIAGNOSIS — R600 Localized edema: Secondary | ICD-10-CM

## 2015-07-24 DIAGNOSIS — S72001A Fracture of unspecified part of neck of right femur, initial encounter for closed fracture: Secondary | ICD-10-CM | POA: Diagnosis not present

## 2015-07-26 ENCOUNTER — Other Ambulatory Visit: Payer: Self-pay | Admitting: *Deleted

## 2015-07-26 DIAGNOSIS — R627 Adult failure to thrive: Secondary | ICD-10-CM | POA: Diagnosis not present

## 2015-07-26 MED ORDER — AMBULATORY NON FORMULARY MEDICATION: Status: AC

## 2015-07-26 NOTE — Telephone Encounter (Signed)
[facility]

## 2015-08-04 ENCOUNTER — Encounter: Payer: Self-pay | Admitting: Adult Health

## 2015-08-04 NOTE — Progress Notes (Signed)
Patient ID: Bob Wilson, male   DOB: November 15, 1932, 82 y.o.   MRN: [id number]    Facility: [facility]      Allergies  Allergen Reactions  . Other     NO BLOOD PRODUCTS(see blood/blood product refusal consent) pt in agreement with albumin   . Ace Inhibitors Other (See Comments)    REACTION: Angioedema  . Aspirin Other (See Comments)    Nose bleeds   . Lisinopril Swelling and Other (See Comments)    REACTION: Facial Swelling  . Rocephin [Ceftriaxone Sodium In Dextrose] Hives    Previously tolerated PCN and Ceph 2/14 Hives with ceftriaxone dose 2/15 No reaction with benadryl/ceftriaxone 2/16 Tolerating Zosyn w/o reaction    Chief Complaint  Patient presents with  . Medical Management of Chronic Issues    HPI:  He is a resident of this facility being seen for the management of his chronic illnesses.  He continues to decline he does not eat or drink well with little intake. He is confused; is unable to participate in the hpi or ros.   Past Medical History  Diagnosis Date  . Anemia     NOS  . Arthritis   . Gout   . Hyperlipidemia   . Hypertension   . GERD (gastroesophageal reflux disease)   . Elevated PSA     multiple times, refused urology eval [facility] notes)  . ETOH abuse   . Medically noncompliant     noncompliant with follow ups, labs.  . Alcoholism ([place]) 06/25/2011  . Cirrhosis, alcoholic ([place]) 11/13/621  . 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 Paroxysmal atrial fibrillation ([facility])   . Bilateral lower extremity edema     Chronic.  Marland Kitchen History of empyema of pleura 12/2011    s/p VATS decortication  . Dysrhythmia     hx of PAF  . Foley catheter in place   . Falls frequently   . 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 Chronic indwelling Foley catheter 04/12/2015    Past Surgical History  Procedure Laterality Date  . Lipoma excision    . Transthoracic echocardiogram  09/03/2014    Normal overall LV size. EF 55-60% with no regional W. MA. Trivial AI. Mild-moderate MR.  Mild R. and LA dilation. Mild to moderate TR. PA pressures ~47 mmHg  . Video assisted thoracoscopy (vats)/empyema  12/2011  . Nm myoview ltd  10/12/14    LOW RISK - NO ISCHEMIA, DIAPHRAGMATIC ATTENUATION, NON-GATED  . Lysette Lindenbaum light laser turp (transurethral resection of prostate N/A 12/21/2014    Procedure: Koray Soter LIGHT LASER TURP (TRANSURETHRAL RESECTION OF PROSTATE;  Surgeon: Ardis Hughs, MD;  Location: [facility];  Service: Urology;  Laterality: N/A;    VITAL SIGNS BP 98/60 mmHg  Pulse 71  Ht '6\' 4"'$  (1.93 m)  Wt 118 lb 9.6 oz (53.797 kg)  BMI 14.44 kg/m2  Patient's Medications  New Prescriptions   No medications on file  Previous Medications   APIXABAN (ELIQUIS) 5 MG TABS TABLET    Take 1 tablet (5 mg total) by mouth 2 (two) times daily.   COLCHICINE 0.6 MG TABLET    Take 0.6 mg by mouth at bedtime.   CYANOCOBALAMIN (,VITAMIN B-12,) 1000 MCG/ML INJECTION    1000 mcg [place] daily for 7 days from 06/05/15, then, 1000 mcg [place] weekly for 1 month, then, 1000 mcg [place] monthly.   DARBEPOETIN ALFA (ARANESP, ALBUMIN FREE,) 500 MCG/ML SOSY INJECTION    Inject 1 mL (500 mcg total) into the skin  every 21 ( twenty-one) days.   DILTIAZEM (CARDIZEM CD) 240 MG 24 HR CAPSULE    Take 1 capsule (240 mg total) by mouth daily.   FEEDING SUPPLEMENT, ENSURE ENLIVE, (ENSURE ENLIVE) LIQD    Take 237 mLs by mouth 3 (three) times daily between meals.   FOLIC ACID (FOLVITE) 1 MG TABLET    Take 3 tablets (3 mg total) by mouth daily.   FUROSEMIDE (LASIX) 40 MG TABLET    Take 1 tablet (40 mg total) by mouth daily as needed for fluid or edema.   HYDROCODONE-ACETAMINOPHEN (NORCO) 10-325 MG TABLET    Take 1 tablet by mouth every 4 (four) hours.   LACTULOSE (CHRONULAC) 10 GM/15ML SOLUTION    Take 30 mLs (20 g total) by mouth daily.   METAXALONE (SKELAXIN) 800 MG TABLET    Take 1 tablet (800 mg total) by mouth 3 (three) times daily.   METOPROLOL TARTRATE (LOPRESSOR) 25 MG TABLET    Take 1 tablet (25 mg total) by mouth 2 (two)  times daily.   MIRTAZAPINE (REMERON) 15 MG TABLET    Take 1 tablet (15 mg total) by mouth at bedtime.   POTASSIUM CHLORIDE ER 20 MEQ TBCR    Take 20 mEq by mouth daily as needed (when taking Lasix).   THIAMINE 100 MG TABLET    Take 1 tablet (100 mg total) by mouth daily.  Modified Medications   No medications on file  Discontinued Medications   No medications on file     SIGNIFICANT DIAGNOSTIC EXAMS   06-01-15: right femur x-ray: . Osteoarthritis in the knee along with bony demineralization. 2. The proximal femur is shown on the hip radiographs and demonstrates a femoral neck fracture.  06-01-15: bilateral hip and pelvis x-ray: Mildly displaced fracture of the right femoral neck.   06-01-15: chest x-ray: Stable chronic lung disease.  No acute findings.  06-19-15: chest x-ray: Probable COPD changes with chronic elevation of RIGHT diaphragm. No acute abnormalities.  06-21-15: ct of right hip: Negative for hematoma. Moderately large volume of simple appearing fluid in the right trochanteric bursa. Subcapital right hip fracture does not appear markedly changed compared to the prior plain films.     LABS REVIEWED:   06-01-15: wbc 8.9; hgb 8.6; hct 24.5; mcv 79.8; plt 234; glucose 78; bun 12; creat 1.49; k+ 2.9; na++126; iron 164; tibc 190; ferritin 373; vit b12; 218; folate 4.8 06-02-15: wbc 6.9; hgb 7.4; hct 20.7; mcv 81.5; plt 176; glucose 81; bun 11; creat 1.31; k+3.5; na++130; mag 1.3 06-03-15: hgb 7.2; hct 20.4; glucose 115; bun 10; creat 1.12; k+ 3.0; na++130; mag 2.6 06-05-15: hgb 7.2; hct 20.1; glucose 93; bun 15; creat 1.40; k+ 3.9; na++132;  06-06-15: wbc 16.5; hgb 7.2; hct 21.9; mcv 89.0; plt 290; glucose 66; bun 18; creat 1.31; k+ 5.0; na++135  06-18-15: wbc 18.0; hgb 7.1; hct 21.0; mcv 86.4; plt 818; glucose 80; bun 20; creat 1.11; k+ 4.6; na++132; total protein 7.7; albumin 2.0  06-19-15: wbc 23.3; hgb 7.5; hct 22.8; mcv 85.4; plt 836; glucose 106; bun 16; creat 1.13;  k+ 4.1; na++135; urine culture: multiple species 06-20-15: wbc 18.7; hgb 7.0; hct 20.9; mcv 85.7; plt 721; glucose 114; bun 14; creat 1.03; k+ 3.6; na++135; ast 12 alt 19; alk phos 388; albumin 1.4 06-21-15: EPO 30.3 06-22-15: tsh 2.615 06-24-15: wbc 17.7; hgb 6.0; hct 18.1; mcv 86.6; plt 587; glucose 102; bun 16; creat 1.05; k+ 3.8; na++138 07-08-15: hgb 7.1; hct 22.2 07-24-15: hgb  6.8; hct 22.1      Review of Systems  Unable to perform ROS: mental acuity   Physical Exam  Constitutional: No distress.  Thin   Eyes: Conjunctivae are normal.  Neck: Neck supple. No JVD present. No thyromegaly present.  Cardiovascular: Normal rate, regular rhythm and intact distal pulses.   Respiratory: Effort normal and breath sounds normal. No respiratory distress. He has no wheezes.  GI: Soft. Bowel sounds are normal. He exhibits no distension. There is no tenderness.  Genitourinary:  Has long term foley   Musculoskeletal: He exhibits no edema.  Is status post right femur fracture Does not voluntarily move  extremities  Lymphadenopathy:    He has no cervical adenopathy.  Neurological:disoriented Skin: Skin is warm and dry. He is not diaphoretic.  Right heel 3.1 x 2.9 cm deep tissue injury left heel 4 x 4 cm deep tissue injury Mid back: pressure ulcer: 0.9 x 2.9 cm  Sacral pressure ulceration: 3.0 x 2.9 cm     ASSESSMENT/ PLAN:   1. Right femur fracture: will continue to be followed by orthopedics and will have therapy as directed will continue vicodin 10/325 mg every 4 hours and will monitor  2. Anemia: will continue aranesp 500 mcg SQ every 21 days (this will be done outpatient) will continue vit B12 injections and will continue folic acid   3.  Afib: heart rate is stable will continue eliquis 5 mg twice daily and will continue cardizem LA 240 mg daily for rate control   4. Hypertension: will continue cardizem LA 240 mg daily and lopressor 25 mg twice daily   5. Gout: no recent flares  present will continue colchicine 0.6 mg daily   6. Alcoholic cirrhosis: will continue lactulose 30 cc daily   7. Bilateral lower extremity edema: will continue lasix 40 mg daily with k+ 20 meq daily   8. Protein calorie malnutrition: will continue supplements per facility protocol  his current weight is 163 pounds will continue remeron 15 mg nightly  9. Hypotonic bladder: has chronic foley  10. Pressure ulcerations: will continue current treatment and will continue to monitor his status.   Due to his increased confusion will get stat cbc; cmp;  Will setup family care plan meeting.      Ok Edwards NP [facility]  Contact [phone] Monday through Friday 8am- 5pm  After hours call [phone]

## 2015-08-15 ENCOUNTER — Telehealth: Payer: Self-pay | Admitting: Family Medicine

## 2015-08-15 NOTE — Telephone Encounter (Signed)
Ms Geurin called to let you know Bob Wilson passed. He passed away yesterday She would like for you to give her a call

## 2015-08-16 ENCOUNTER — Telehealth: Payer: Self-pay | Admitting: Family Medicine

## 2015-08-16 NOTE — Telephone Encounter (Signed)
Will review 

## 2015-08-16 NOTE — Telephone Encounter (Signed)
Daughter dropped off accidental death policy for pt per Dr ONEOK request   Placing on cart for distibution.  Thank you

## 2015-08-16 NOTE — Telephone Encounter (Signed)
Patient's wife called and asked for Dr.Copland to call her. She has some questions.  Please call her back at [phone].  I let her know Dr.Copland is off until Monday.

## 2015-08-16 NOTE — Telephone Encounter (Signed)
Discussed situation with Bob Wilson - had some questions and the patient had an accidental death policy. Question regarding if initial hip fracture and subsequent bedbound state would qualify for this. Truthfully, I am unsure - asked her to bring paperwork to our office, I can review and discuss with my colleagues.

## 2015-08-21 DEATH — deceased

## 2015-08-22 NOTE — Telephone Encounter (Signed)
Pt wife called. Would like to know if you have reviewed policy? If so, what do you think? She would also like to come by and pick up policy when your finished. Please advise  [phone]

## 2015-08-22 NOTE — Telephone Encounter (Signed)
I spoke to Mrs. Sterkel, who is picking up paperwork today. Doing ok.  Medical examiner involved in this case per her report - accidental death. Fx within 90 days.

## 2016-12-22 IMAGING — CR DG CHEST 1V PORT
1 series · 1 of 1 positions shown · non-contrast
Comparison: 12/18/2014

CLINICAL DATA: Fall when right hip fracture. Preoperative
respiratory exam.

EXAM:
PORTABLE CHEST 1 VIEW

[AP]
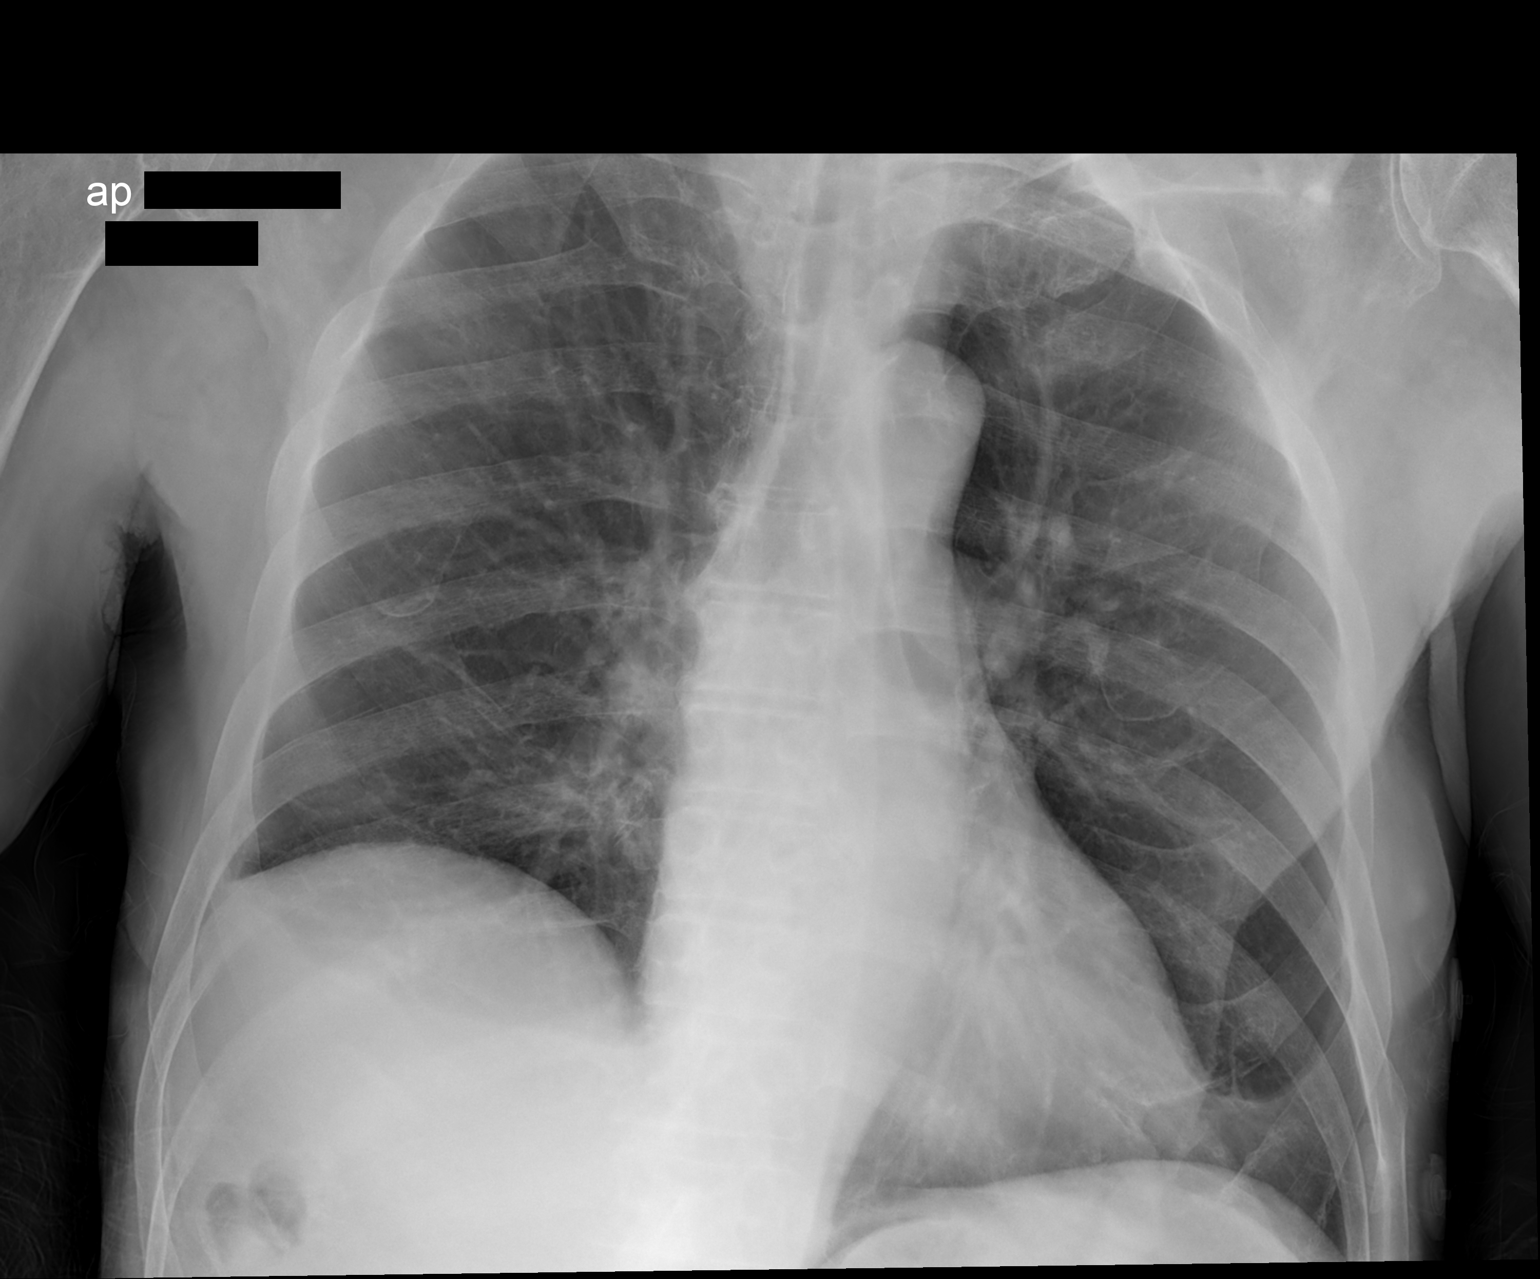

[1 of 1 positions shown; findings below may reference images not displayed]

FINDINGS: Stable chronic lung disease and elevation of the right
hemidiaphragm. There is no evidence of pulmonary edema,
consolidation, pneumothorax, nodule or pleural fluid. The heart size
and mediastinal contours are within normal limits.
IMPRESSION: Stable chronic lung disease.  No acute findings.

## 2016-12-22 IMAGING — CR DG HIP (WITH OR WITHOUT PELVIS) 2-3V*R*
3 series · 3 of 3 positions shown · non-contrast
Comparison: 04/10/2015

CLINICAL DATA: Pt c/o severe hip pain x 3hrs today s/p "sat down
hard" when walking with his roller-walker this morning and slipped.

EXAM:
DG HIP (WITH OR WITHOUT PELVIS) 2-3V RIGHT

[w hip lat right]
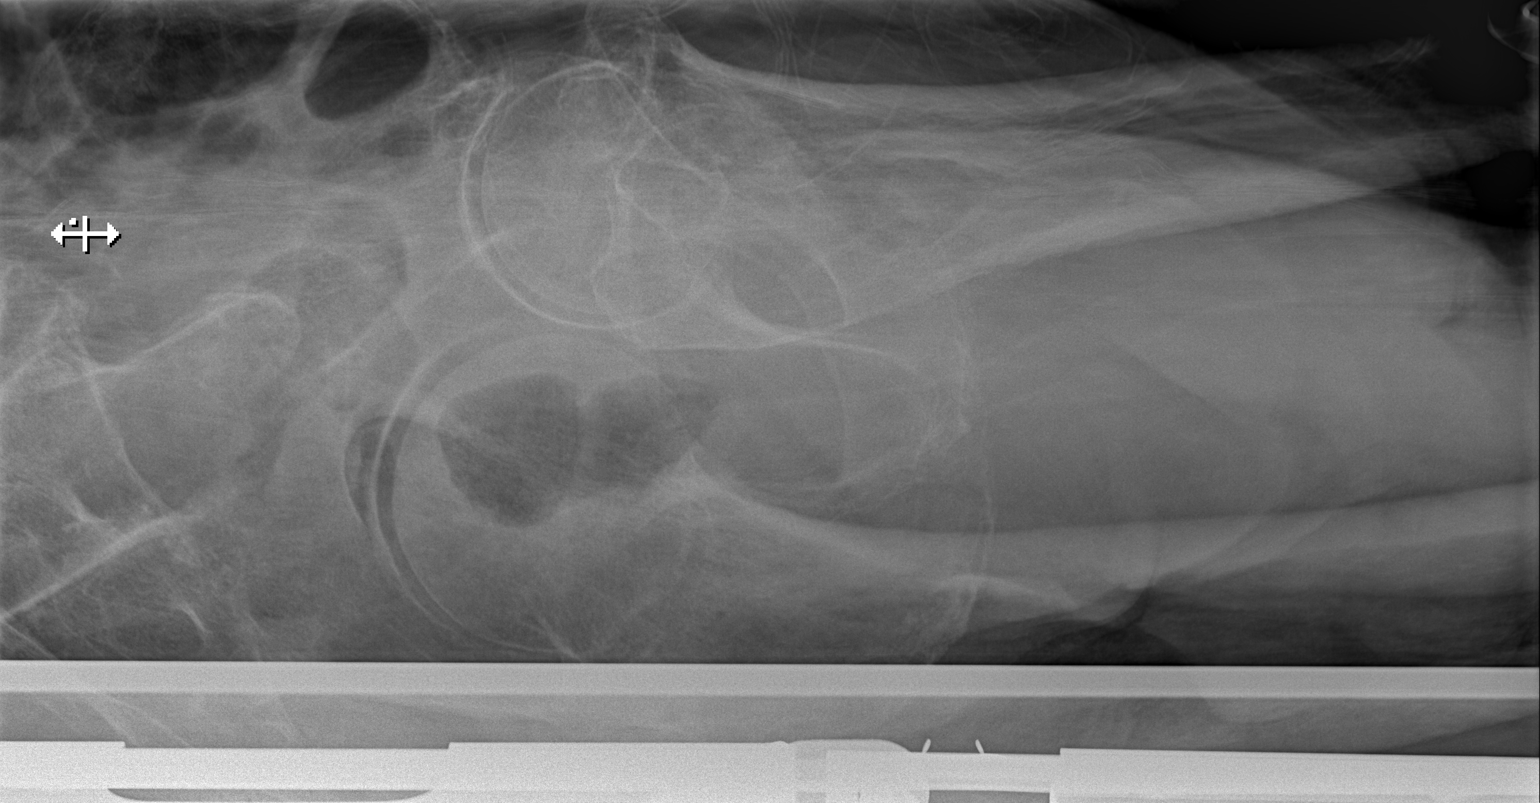

[x pelvis (1 of 2)]
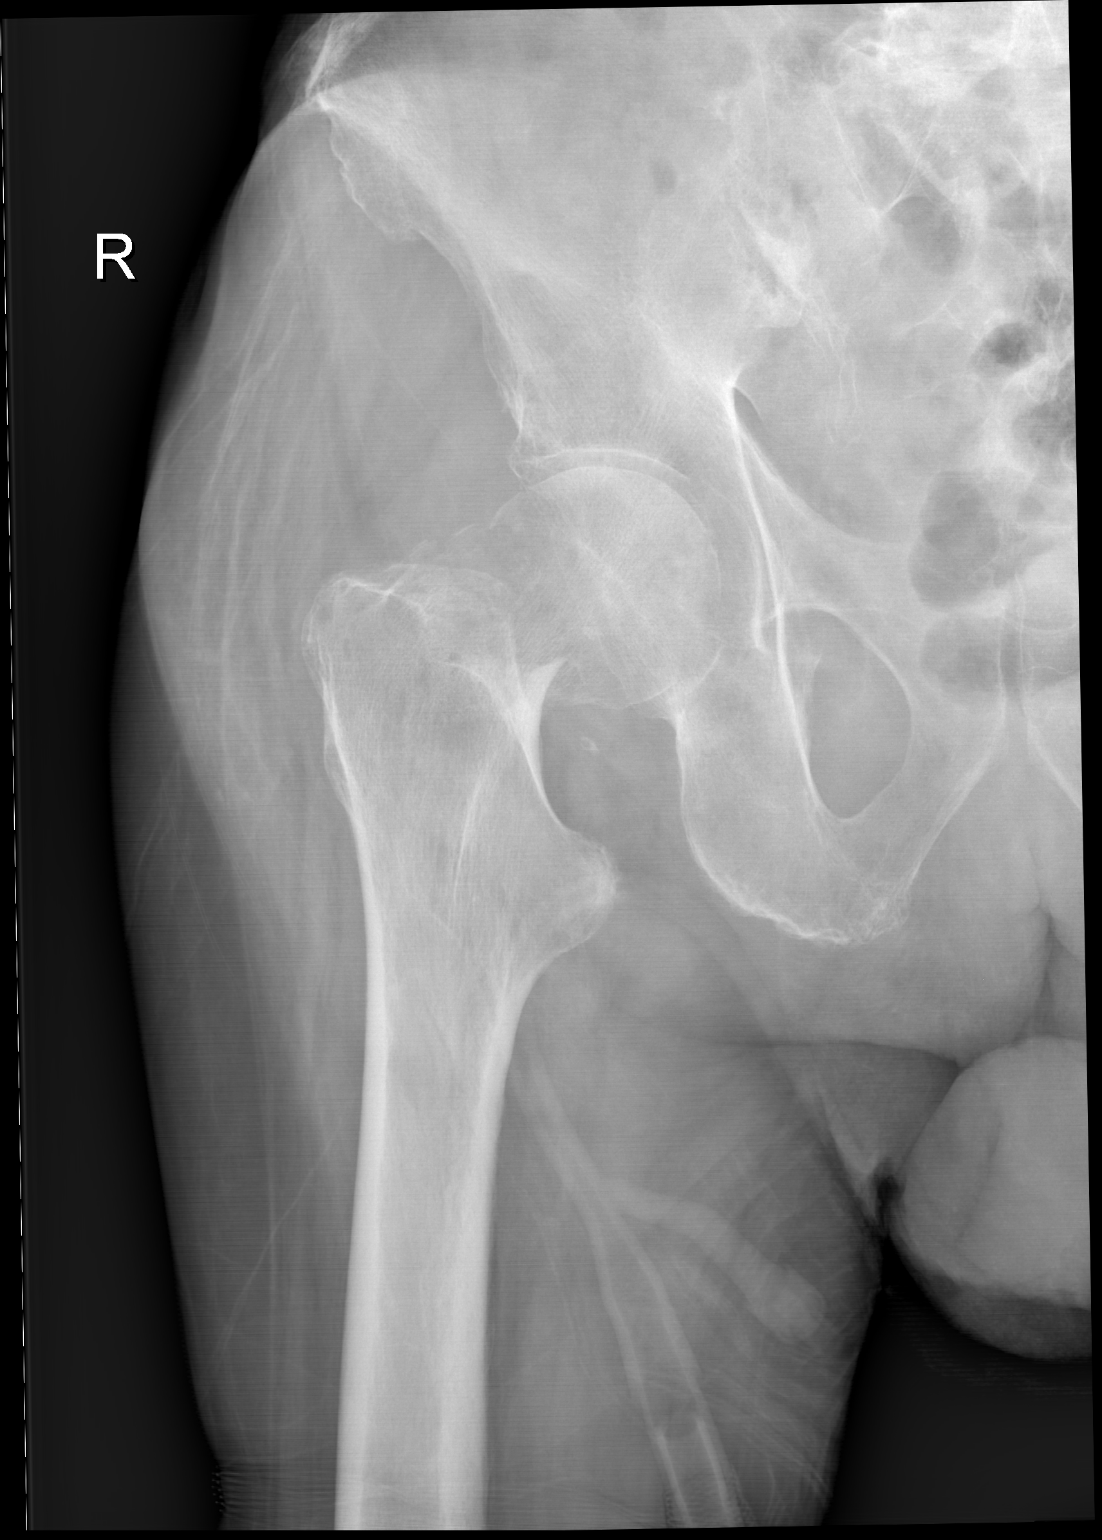

[x pelvis (2 of 2)]
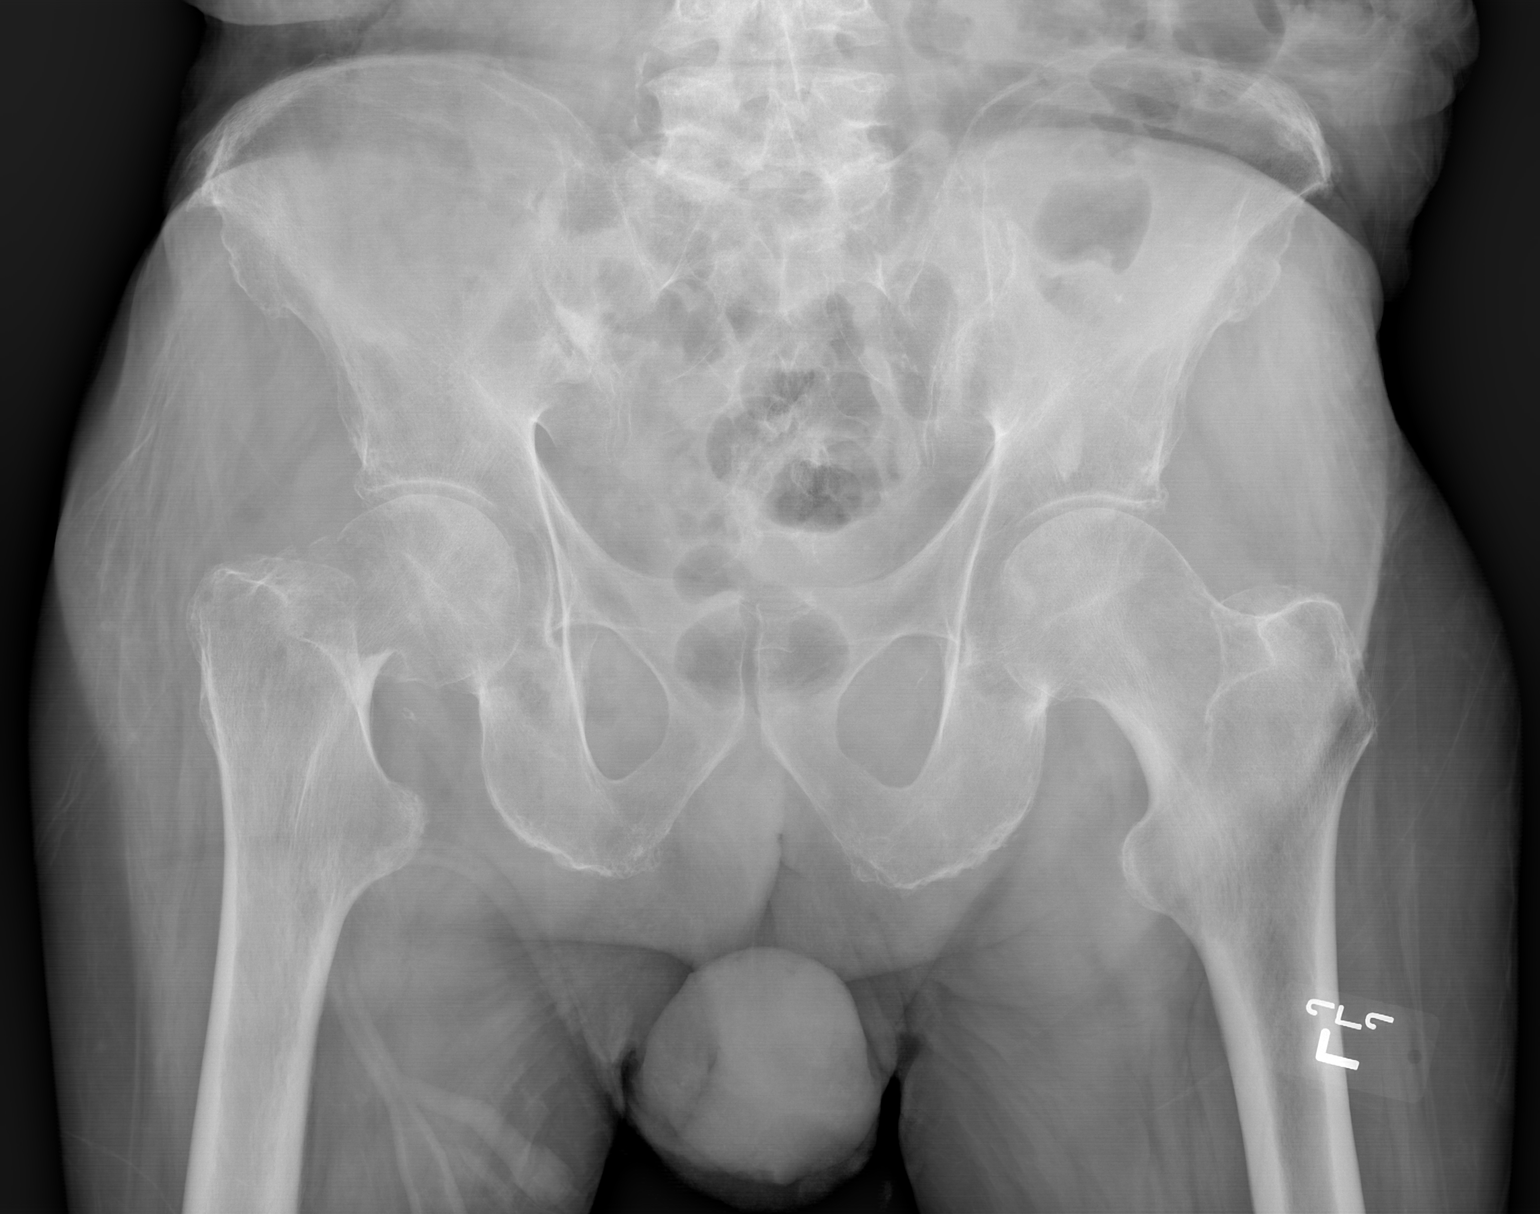

[3 of 3 positions shown; findings below may reference images not displayed]

FINDINGS: There is a fracture of the right femoral neck, mid cervical, mildly
comminuted and displaced, with the distal fracture component
displacing superiorly by approximately 1 cm. There is no significant
varus or valgus angulation. There is mild apex anterior angulation.

Bones are diffusely demineralized. No other fractures. Hip joints
are normally aligned.
IMPRESSION: Mildly displaced fracture of the right femoral neck.

## 2017-01-09 IMAGING — DX DG CHEST 2V
3 series · 3 of 3 positions shown · non-contrast
Comparison: 06/01/2015

CLINICAL DATA: Chest pain, shortness of breath and cough for 1
week, hypertension, former smoker, cirrhosis, paroxysmal atrial
fibrillation

EXAM:
CHEST  2 VIEW

[x chest ap (1 of 2)]
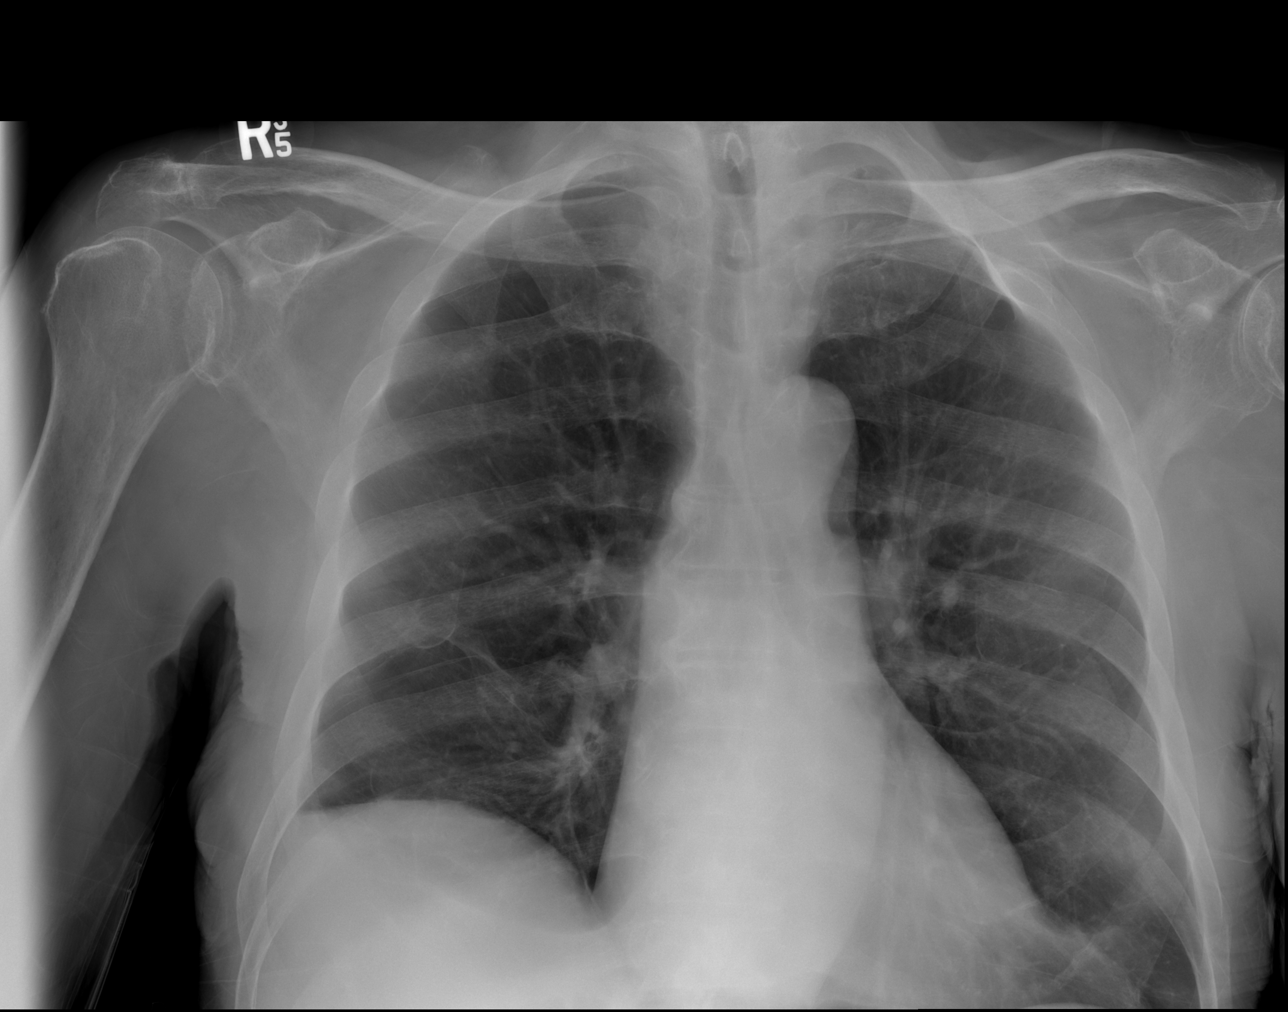

[x chest ap (2 of 2)]
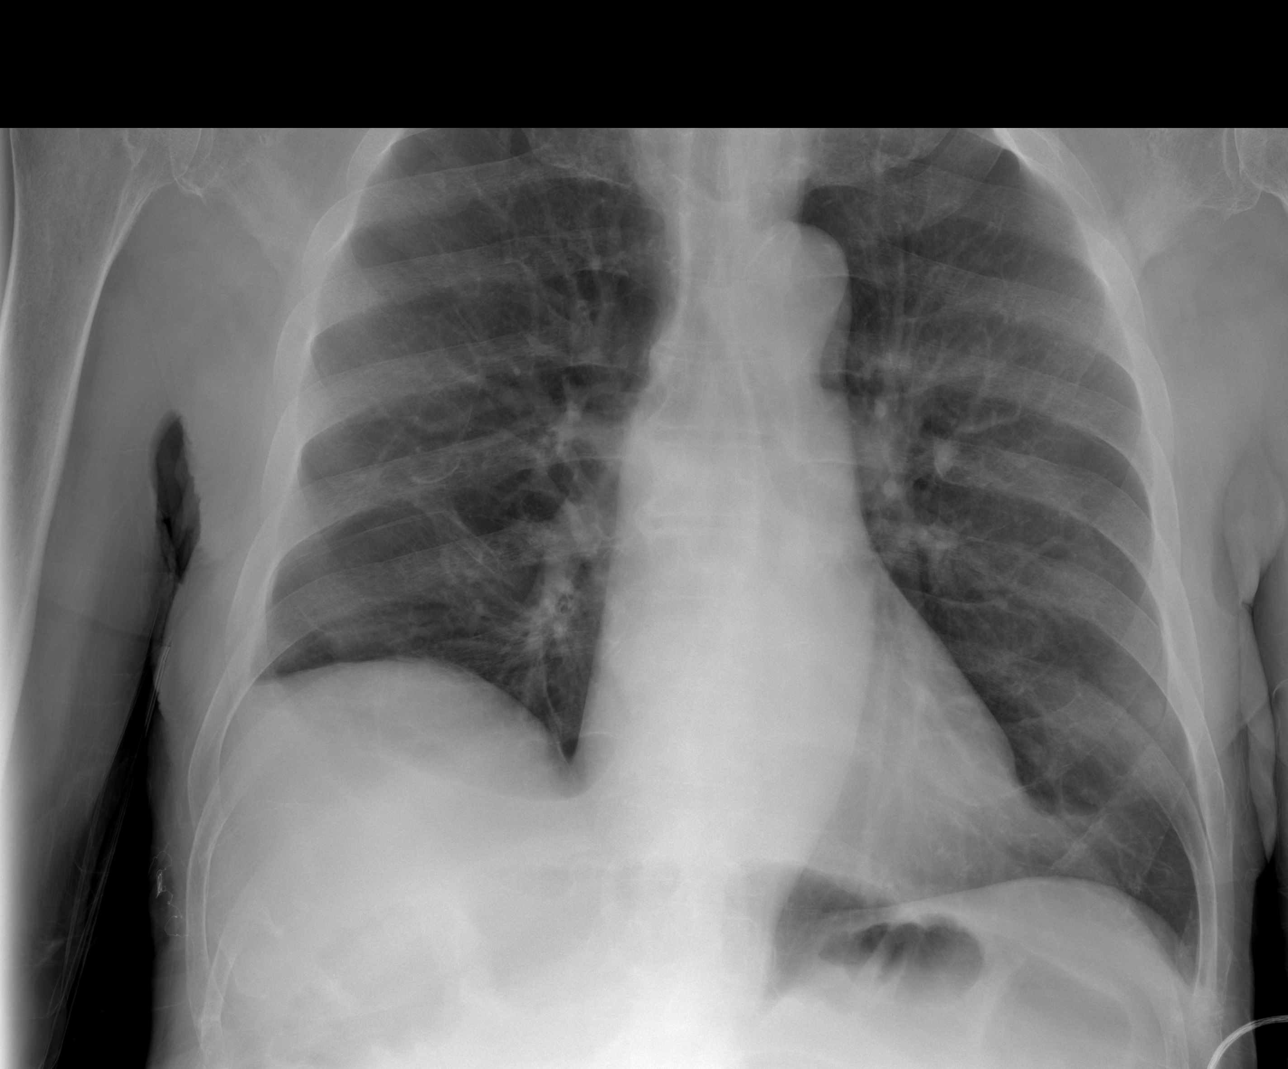

[w chest lat]
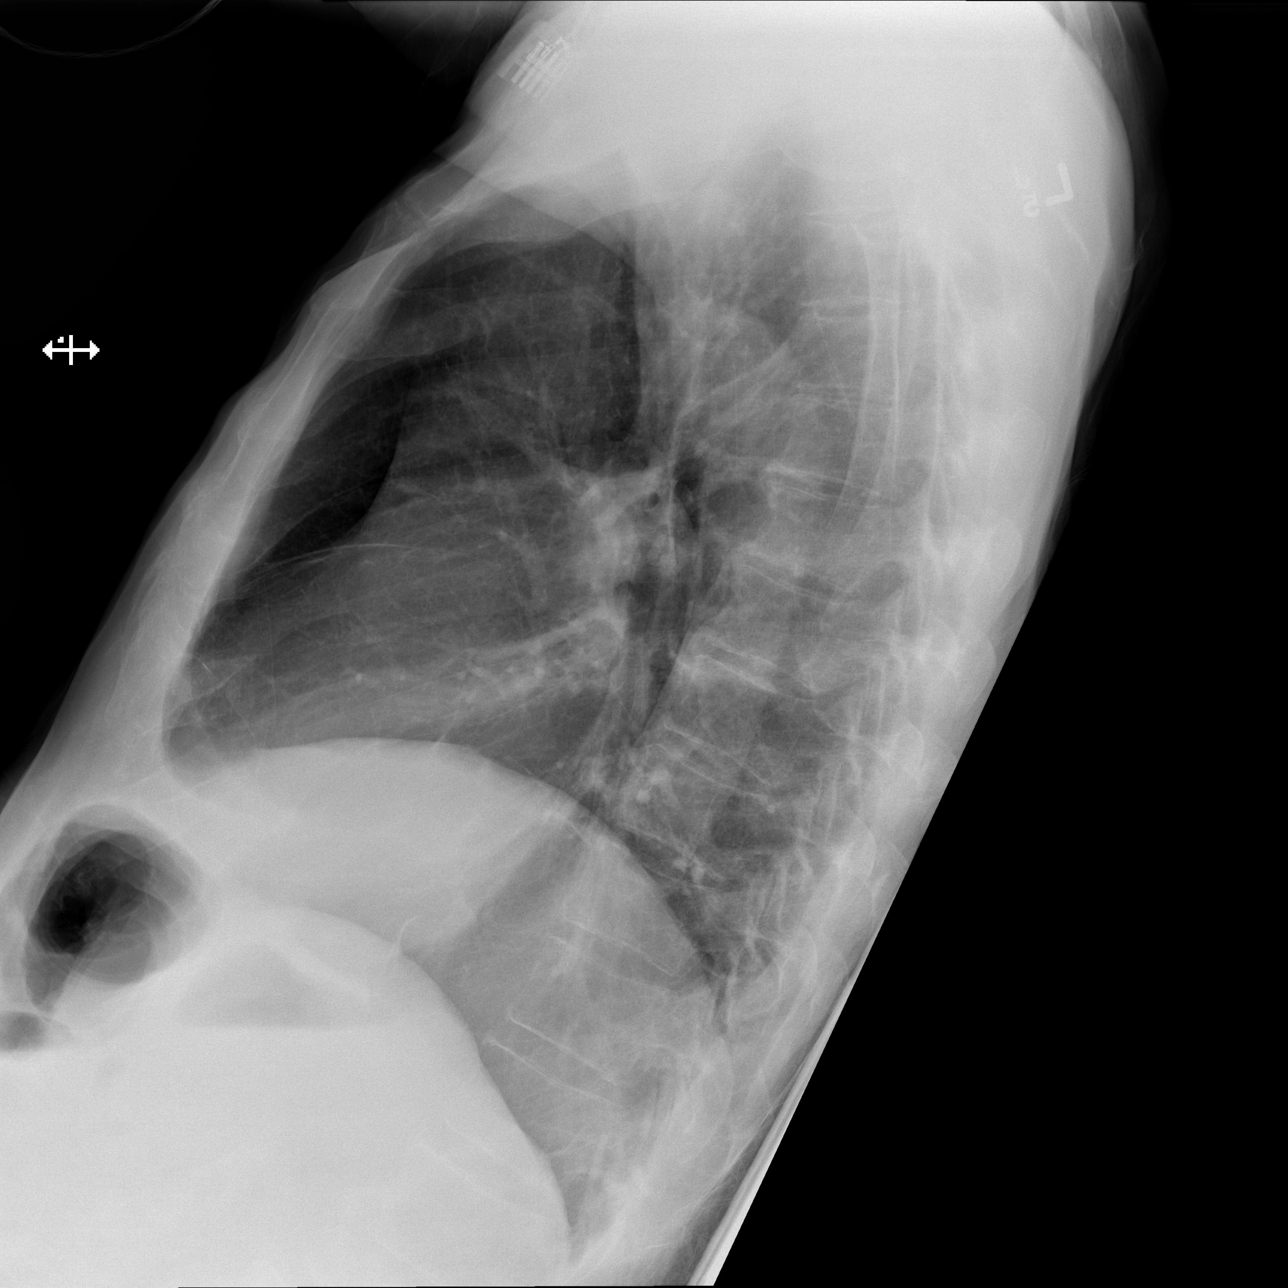

[3 of 3 positions shown; findings below may reference images not displayed]

FINDINGS: Normal heart size, mediastinal contours, and pulmonary vascularity.

Minimal atherosclerotic calcification and elongation of thoracic
aorta.

Lungs appear hyperinflated with chronic elevation of RIGHT
diaphragm.

No acute infiltrate, pleural effusion or pneumothorax.

Probable LEFT nipple shadow, seen external to the costal margin on
prior exam.

No acute osseous findings.
IMPRESSION: Probable COPD changes with chronic elevation of RIGHT diaphragm.

No acute abnormalities.
